# Patient Record
Sex: Female | Born: 1960
Health system: Southern US, Community
[De-identification: ages and names within clinical notes are randomized; demographics above are authoritative.]

## PROBLEM LIST (undated history)

## (undated) DIAGNOSIS — E785 Hyperlipidemia, unspecified: Secondary | ICD-10-CM

## (undated) DIAGNOSIS — R0602 Shortness of breath: Secondary | ICD-10-CM

## (undated) DIAGNOSIS — J439 Emphysema, unspecified: Secondary | ICD-10-CM

## (undated) DIAGNOSIS — R0600 Dyspnea, unspecified: Secondary | ICD-10-CM

## (undated) DIAGNOSIS — Z8052 Family history of malignant neoplasm of bladder: Secondary | ICD-10-CM

## (undated) DIAGNOSIS — Z808 Family history of malignant neoplasm of other organs or systems: Secondary | ICD-10-CM

## (undated) DIAGNOSIS — M2550 Pain in unspecified joint: Secondary | ICD-10-CM

## (undated) DIAGNOSIS — M069 Rheumatoid arthritis, unspecified: Secondary | ICD-10-CM

## (undated) DIAGNOSIS — N189 Chronic kidney disease, unspecified: Secondary | ICD-10-CM

## (undated) DIAGNOSIS — G4730 Sleep apnea, unspecified: Secondary | ICD-10-CM

## (undated) DIAGNOSIS — I2510 Atherosclerotic heart disease of native coronary artery without angina pectoris: Secondary | ICD-10-CM

## (undated) DIAGNOSIS — G629 Polyneuropathy, unspecified: Secondary | ICD-10-CM

## (undated) DIAGNOSIS — J449 Chronic obstructive pulmonary disease, unspecified: Secondary | ICD-10-CM

## (undated) DIAGNOSIS — C649 Malignant neoplasm of unspecified kidney, except renal pelvis: Secondary | ICD-10-CM

## (undated) DIAGNOSIS — K829 Disease of gallbladder, unspecified: Secondary | ICD-10-CM

## (undated) DIAGNOSIS — D333 Benign neoplasm of cranial nerves: Secondary | ICD-10-CM

## (undated) DIAGNOSIS — I10 Essential (primary) hypertension: Secondary | ICD-10-CM

## (undated) DIAGNOSIS — Z803 Family history of malignant neoplasm of breast: Secondary | ICD-10-CM

## (undated) DIAGNOSIS — M549 Dorsalgia, unspecified: Secondary | ICD-10-CM

## (undated) DIAGNOSIS — R7303 Prediabetes: Secondary | ICD-10-CM

## (undated) DIAGNOSIS — H469 Unspecified optic neuritis: Secondary | ICD-10-CM

## (undated) HISTORY — DX: Family history of malignant neoplasm of other organs or systems: Z80.8

## (undated) HISTORY — PX: HYSTERECTOMY ABDOMINAL WITH SALPINGECTOMY: SHX6725

## (undated) HISTORY — DX: Family history of malignant neoplasm of bladder: Z80.52

## (undated) HISTORY — DX: Shortness of breath: R06.02

## (undated) HISTORY — PX: ABDOMINAL HYSTERECTOMY: SHX81

## (undated) HISTORY — DX: Pain in unspecified joint: M25.50

## (undated) HISTORY — DX: Hyperlipidemia, unspecified: E78.5

## (undated) HISTORY — DX: Disease of gallbladder, unspecified: K82.9

## (undated) HISTORY — DX: Atherosclerotic heart disease of native coronary artery without angina pectoris: I25.10

## (undated) HISTORY — PX: MENISCUS REPAIR: SHX5179

## (undated) HISTORY — PX: CHOLECYSTECTOMY: SHX55

## (undated) HISTORY — DX: Polyneuropathy, unspecified: G62.9

## (undated) HISTORY — DX: Unspecified optic neuritis: H46.9

## (undated) HISTORY — DX: Malignant neoplasm of unspecified kidney, except renal pelvis: C64.9

## (undated) HISTORY — DX: Prediabetes: R73.03

## (undated) HISTORY — DX: Chronic obstructive pulmonary disease, unspecified: J44.9

## (undated) HISTORY — DX: Family history of malignant neoplasm of breast: Z80.3

## (undated) HISTORY — DX: Dorsalgia, unspecified: M54.9

## (undated) HISTORY — DX: Rheumatoid arthritis, unspecified: M06.9

## (undated) HISTORY — DX: Emphysema, unspecified: J43.9

---

## 1993-04-18 HISTORY — PX: OTHER SURGICAL HISTORY: SHX169

## 1998-04-18 HISTORY — PX: BREAST EXCISIONAL BIOPSY: SUR124

## 2009-04-18 HISTORY — PX: CRANIOTOMY: SHX93

## 2009-04-18 HISTORY — PX: ACOUSTIC NEUROMA RESECTION: SHX5713

## 2009-04-18 HISTORY — PX: OTHER SURGICAL HISTORY: SHX169

## 2012-02-14 ENCOUNTER — Other Ambulatory Visit: Payer: Self-pay | Admitting: Family Medicine

## 2012-02-14 DIAGNOSIS — D333 Benign neoplasm of cranial nerves: Secondary | ICD-10-CM

## 2012-02-22 ENCOUNTER — Ambulatory Visit
Admission: RE | Admit: 2012-02-22 | Discharge: 2012-02-22 | Disposition: A | Discharge status: Home / Self Care | Payer: Medicare Other | Source: Ambulatory Visit | Attending: Family Medicine | Admitting: Family Medicine

## 2012-02-22 DIAGNOSIS — D333 Benign neoplasm of cranial nerves: Secondary | ICD-10-CM

## 2012-02-22 MED ORDER — GADOBENATE DIMEGLUMINE 529 MG/ML IV SOLN
20.0000 mL | Freq: Once | INTRAVENOUS | Status: AC | PRN
  Administered 2012-02-22: 20 mL via INTRAVENOUS

## 2013-12-13 ENCOUNTER — Other Ambulatory Visit: Payer: Self-pay

## 2013-12-13 DIAGNOSIS — Z1231 Encounter for screening mammogram for malignant neoplasm of breast: Secondary | ICD-10-CM

## 2013-12-20 ENCOUNTER — Ambulatory Visit
Admission: RE | Admit: 2013-12-20 | Discharge: 2013-12-20 | Disposition: A | Discharge status: Home / Self Care | Payer: Medicare Other | Source: Ambulatory Visit

## 2013-12-20 DIAGNOSIS — Z1231 Encounter for screening mammogram for malignant neoplasm of breast: Secondary | ICD-10-CM

## 2013-12-26 ENCOUNTER — Other Ambulatory Visit: Payer: Self-pay | Admitting: Family Medicine

## 2013-12-26 DIAGNOSIS — R928 Other abnormal and inconclusive findings on diagnostic imaging of breast: Secondary | ICD-10-CM

## 2014-01-02 ENCOUNTER — Ambulatory Visit
Admission: RE | Admit: 2014-01-02 | Discharge: 2014-01-02 | Disposition: A | Discharge status: Home / Self Care | Payer: Medicare Other | Source: Ambulatory Visit | Attending: Family Medicine | Admitting: Family Medicine

## 2014-01-02 DIAGNOSIS — R928 Other abnormal and inconclusive findings on diagnostic imaging of breast: Secondary | ICD-10-CM

## 2014-12-15 DIAGNOSIS — R5383 Other fatigue: Secondary | ICD-10-CM | POA: Diagnosis not present

## 2014-12-15 DIAGNOSIS — I10 Essential (primary) hypertension: Secondary | ICD-10-CM | POA: Diagnosis not present

## 2014-12-15 DIAGNOSIS — F17210 Nicotine dependence, cigarettes, uncomplicated: Secondary | ICD-10-CM | POA: Diagnosis not present

## 2014-12-15 DIAGNOSIS — R2689 Other abnormalities of gait and mobility: Secondary | ICD-10-CM | POA: Diagnosis not present

## 2014-12-15 DIAGNOSIS — E6609 Other obesity due to excess calories: Secondary | ICD-10-CM | POA: Diagnosis not present

## 2014-12-15 DIAGNOSIS — Z0000 Encounter for general adult medical examination without abnormal findings: Secondary | ICD-10-CM | POA: Diagnosis not present

## 2014-12-15 DIAGNOSIS — G4730 Sleep apnea, unspecified: Secondary | ICD-10-CM | POA: Diagnosis not present

## 2014-12-26 DIAGNOSIS — H524 Presbyopia: Secondary | ICD-10-CM | POA: Diagnosis not present

## 2014-12-26 DIAGNOSIS — H5210 Myopia, unspecified eye: Secondary | ICD-10-CM | POA: Diagnosis not present

## 2015-02-03 DIAGNOSIS — G4733 Obstructive sleep apnea (adult) (pediatric): Secondary | ICD-10-CM | POA: Diagnosis not present

## 2015-02-03 DIAGNOSIS — G4700 Insomnia, unspecified: Secondary | ICD-10-CM | POA: Diagnosis not present

## 2015-02-03 DIAGNOSIS — Z720 Tobacco use: Secondary | ICD-10-CM | POA: Diagnosis not present

## 2015-02-18 DIAGNOSIS — G4733 Obstructive sleep apnea (adult) (pediatric): Secondary | ICD-10-CM | POA: Diagnosis not present

## 2015-03-10 DIAGNOSIS — G4700 Insomnia, unspecified: Secondary | ICD-10-CM | POA: Diagnosis not present

## 2015-05-18 DIAGNOSIS — G4700 Insomnia, unspecified: Secondary | ICD-10-CM | POA: Diagnosis not present

## 2015-05-30 DIAGNOSIS — G4733 Obstructive sleep apnea (adult) (pediatric): Secondary | ICD-10-CM | POA: Diagnosis not present

## 2015-07-16 DIAGNOSIS — R229 Localized swelling, mass and lump, unspecified: Secondary | ICD-10-CM | POA: Diagnosis not present

## 2015-08-13 DIAGNOSIS — M722 Plantar fascial fibromatosis: Secondary | ICD-10-CM | POA: Diagnosis not present

## 2015-08-13 DIAGNOSIS — Z86018 Personal history of other benign neoplasm: Secondary | ICD-10-CM | POA: Diagnosis not present

## 2015-08-13 DIAGNOSIS — B070 Plantar wart: Secondary | ICD-10-CM | POA: Diagnosis not present

## 2015-08-13 DIAGNOSIS — T148 Other injury of unspecified body region: Secondary | ICD-10-CM | POA: Diagnosis not present

## 2015-08-18 DIAGNOSIS — M25571 Pain in right ankle and joints of right foot: Secondary | ICD-10-CM | POA: Diagnosis not present

## 2015-08-18 DIAGNOSIS — M722 Plantar fascial fibromatosis: Secondary | ICD-10-CM | POA: Diagnosis not present

## 2015-08-18 DIAGNOSIS — D2371 Other benign neoplasm of skin of right lower limb, including hip: Secondary | ICD-10-CM | POA: Diagnosis not present

## 2015-08-18 DIAGNOSIS — M10079 Idiopathic gout, unspecified ankle and foot: Secondary | ICD-10-CM | POA: Diagnosis not present

## 2015-08-18 DIAGNOSIS — M79671 Pain in right foot: Secondary | ICD-10-CM | POA: Diagnosis not present

## 2015-08-18 DIAGNOSIS — Z79899 Other long term (current) drug therapy: Secondary | ICD-10-CM | POA: Diagnosis not present

## 2015-08-18 DIAGNOSIS — M79672 Pain in left foot: Secondary | ICD-10-CM | POA: Diagnosis not present

## 2015-08-25 DIAGNOSIS — M25571 Pain in right ankle and joints of right foot: Secondary | ICD-10-CM | POA: Diagnosis not present

## 2015-08-25 DIAGNOSIS — G5762 Lesion of plantar nerve, left lower limb: Secondary | ICD-10-CM | POA: Diagnosis not present

## 2015-08-25 DIAGNOSIS — M65872 Other synovitis and tenosynovitis, left ankle and foot: Secondary | ICD-10-CM | POA: Diagnosis not present

## 2015-08-25 DIAGNOSIS — G5761 Lesion of plantar nerve, right lower limb: Secondary | ICD-10-CM | POA: Diagnosis not present

## 2015-08-25 DIAGNOSIS — M65871 Other synovitis and tenosynovitis, right ankle and foot: Secondary | ICD-10-CM | POA: Diagnosis not present

## 2015-08-25 DIAGNOSIS — M792 Neuralgia and neuritis, unspecified: Secondary | ICD-10-CM | POA: Diagnosis not present

## 2015-09-01 DIAGNOSIS — J343 Hypertrophy of nasal turbinates: Secondary | ICD-10-CM | POA: Diagnosis not present

## 2015-09-01 DIAGNOSIS — G4733 Obstructive sleep apnea (adult) (pediatric): Secondary | ICD-10-CM | POA: Diagnosis not present

## 2015-09-01 DIAGNOSIS — H9042 Sensorineural hearing loss, unilateral, left ear, with unrestricted hearing on the contralateral side: Secondary | ICD-10-CM | POA: Diagnosis not present

## 2015-09-01 DIAGNOSIS — H9312 Tinnitus, left ear: Secondary | ICD-10-CM | POA: Diagnosis not present

## 2015-09-01 DIAGNOSIS — H9202 Otalgia, left ear: Secondary | ICD-10-CM | POA: Diagnosis not present

## 2015-09-01 DIAGNOSIS — H6122 Impacted cerumen, left ear: Secondary | ICD-10-CM | POA: Diagnosis not present

## 2015-09-01 DIAGNOSIS — Z86018 Personal history of other benign neoplasm: Secondary | ICD-10-CM | POA: Diagnosis not present

## 2015-09-08 DIAGNOSIS — G5761 Lesion of plantar nerve, right lower limb: Secondary | ICD-10-CM | POA: Diagnosis not present

## 2015-09-08 DIAGNOSIS — M792 Neuralgia and neuritis, unspecified: Secondary | ICD-10-CM | POA: Diagnosis not present

## 2015-09-08 DIAGNOSIS — G5762 Lesion of plantar nerve, left lower limb: Secondary | ICD-10-CM | POA: Diagnosis not present

## 2015-09-24 DIAGNOSIS — H9042 Sensorineural hearing loss, unilateral, left ear, with unrestricted hearing on the contralateral side: Secondary | ICD-10-CM | POA: Diagnosis not present

## 2015-10-11 ENCOUNTER — Encounter (HOSPITAL_COMMUNITY): Payer: Self-pay

## 2015-10-11 ENCOUNTER — Emergency Department (HOSPITAL_COMMUNITY)
Admission: EM | Admit: 2015-10-11 | Discharge: 2015-10-11 | Disposition: A | Discharge status: Home / Self Care | Payer: Commercial Managed Care - HMO | Attending: Emergency Medicine | Admitting: Emergency Medicine

## 2015-10-11 DIAGNOSIS — Z79899 Other long term (current) drug therapy: Secondary | ICD-10-CM | POA: Diagnosis not present

## 2015-10-11 DIAGNOSIS — M545 Low back pain, unspecified: Secondary | ICD-10-CM

## 2015-10-11 DIAGNOSIS — Z791 Long term (current) use of non-steroidal anti-inflammatories (NSAID): Secondary | ICD-10-CM | POA: Insufficient documentation

## 2015-10-11 DIAGNOSIS — I10 Essential (primary) hypertension: Secondary | ICD-10-CM | POA: Insufficient documentation

## 2015-10-11 DIAGNOSIS — M549 Dorsalgia, unspecified: Secondary | ICD-10-CM | POA: Diagnosis present

## 2015-10-11 DIAGNOSIS — R109 Unspecified abdominal pain: Secondary | ICD-10-CM | POA: Insufficient documentation

## 2015-10-11 HISTORY — DX: Essential (primary) hypertension: I10

## 2015-10-11 LAB — I-STAT CHEM 8, ED
BUN: 10 mg/dL (ref 6–20)
Calcium, Ion: 1.18 mmol/L (ref 1.12–1.23)
Chloride: 104 mmol/L (ref 101–111)
Creatinine, Ser: 0.7 mg/dL (ref 0.44–1.00)
Glucose, Bld: 83 mg/dL (ref 65–99)
HCT: 44 % (ref 36.0–46.0)
Hemoglobin: 15 g/dL (ref 12.0–15.0)
Potassium: 3.9 mmol/L (ref 3.5–5.1)
Sodium: 143 mmol/L (ref 135–145)
TCO2: 25 mmol/L (ref 0–100)

## 2015-10-11 LAB — URINALYSIS, ROUTINE W REFLEX MICROSCOPIC
Bilirubin Urine: NEGATIVE
Glucose, UA: NEGATIVE mg/dL
Hgb urine dipstick: NEGATIVE
Ketones, ur: NEGATIVE mg/dL
Leukocytes, UA: NEGATIVE
Nitrite: NEGATIVE
Protein, ur: NEGATIVE mg/dL
Specific Gravity, Urine: 1.018 (ref 1.005–1.030)
pH: 5.5 (ref 5.0–8.0)

## 2015-10-11 MED ORDER — NAPROXEN 375 MG PO TABS: 375.0000 mg | ORAL_TABLET | Freq: Two times a day (BID) | ORAL | Status: DC

## 2015-10-11 MED ORDER — NAPROXEN 250 MG PO TABS
375.0000 mg | ORAL_TABLET | Freq: Once | ORAL | Status: AC
  Administered 2015-10-11: 375 mg via ORAL
  Filled 2015-10-11: qty 2

## 2015-10-11 MED ORDER — PREDNISONE 20 MG PO TABS: 20.0000 mg | ORAL_TABLET | Freq: Every day | ORAL | Status: DC

## 2015-10-11 MED ORDER — ACETAMINOPHEN 500 MG PO TABS
500.0000 mg | ORAL_TABLET | Freq: Four times a day (QID) | ORAL | Status: AC
Start: 2015-10-11 — End: 2015-10-17

## 2015-10-11 MED ORDER — CYCLOBENZAPRINE HCL 10 MG PO TABS: 10.0000 mg | ORAL_TABLET | Freq: Every day | ORAL | Status: DC

## 2015-10-11 MED ORDER — ACETAMINOPHEN 500 MG PO TABS
1000.0000 mg | ORAL_TABLET | Freq: Once | ORAL | Status: AC
  Administered 2015-10-11: 1000 mg via ORAL
  Filled 2015-10-11: qty 2

## 2015-10-11 NOTE — ED Provider Notes (Signed)
55 year old female, she has had some left-sided flank and lower back pain with some radiation to the side but no abdominal discomfort. On exam the patient does every principal tenderness with both movement and palpation, there is no midline tenderness, she has normal neurovascular exam of the lower extremities including pulses strength and sensation. Labs reviewed including urinalysis and chemistry, unremarkable, the patient is not having urinary symptoms, she appears well for discharge, she was encouraged to use anti-inflammatories and a muscle relaxant and follow-up in 48 hours with her family doctor and was in full agreement. I doubt intra-abdominal pathology including aneurysm, kidney stone or  pyelonephritis given exam and results. The patient was informed of the indications for return and she is in agreement.  I saw and evaluated the patient, reviewed the resident's note and I agree with the findings and plan.    Final diagnoses:  Left-sided low back pain without sciatica      Noemi Chapel, MD 10/13/15 1044

## 2015-10-11 NOTE — ED Provider Notes (Signed)
CSN: [id number]     Arrival date & time 10/11/15  1354 History   First MD Initiated Contact with Patient 10/11/15 1558     Chief Complaint  Patient presents with  . Flank Pain   HPI Patient denies of left flank pain for 2 days, worse with movement and change of position. She states she woke up with the pain like this 2-3 days ago. Took Aleve 2 days ago but none since. This did not appear to help. Any movement of her back seems to make it worse. No chest pain, no shortness of breath, no history of MI. No abdominal pain. No vomiting, no anorexia. No leg weakness, no incontinence. No numbness. No pain in her lower extremities. She is able to ambulate but the pain keeps her from wanting to so she has been laying in bed. She has had back strains in the past but never this bad. She is disabled secondary to painful conditions but is not taking narcotics long-term. Patient states that she attempted to go to her primary care provider yesterday but she would've had to wait 2 hours so she left. Since she was not feeling better, she presented today.  Past Medical History  Diagnosis Date  . Hypertension    History reviewed. No pertinent past surgical history. No family history on file. Social History  Substance Use Topics  . Smoking status: Never Smoker   . Smokeless tobacco: None  . Alcohol Use: None   OB History    No data available     Review of Systems  Constitutional: Negative for fever.  Allergic/Immunologic: Negative for immunocompromised state.  All other systems reviewed and are negative.     Allergies  Review of patient's allergies indicates no known allergies.  Home Medications   Prior to Admission medications   Medication Sig Start Date End Date Taking? Authorizing Provider  acetaminophen (TYLENOL) 500 MG tablet Take 1 tablet (500 mg total) by mouth every 6 (six) hours. 10/11/15 10/17/15  Karma Greaser, MD  ALPRAZolam Duanne Moron) 0.25 MG tablet Take 0.25 mg by mouth daily as  needed for anxiety or sleep.  08/12/15  Yes Historical Provider, MD  clotrimazole-betamethasone (LOTRISONE) cream Apply 1 application topically daily as needed. 07/21/15  Yes Historical Provider, MD  cyclobenzaprine (FLEXERIL) 10 MG tablet Take 1 tablet (10 mg total) by mouth at bedtime. 10/11/15   Karma Greaser, MD  gabapentin (NEURONTIN) 300 MG capsule Take 300 mg by mouth at bedtime. 09/08/15  Yes Historical Provider, MD  ibuprofen (ADVIL,MOTRIN) 200 MG tablet Take 400 mg by mouth every 6 (six) hours as needed for moderate pain.   Yes Historical Provider, MD  lisinopril (PRINIVIL,ZESTRIL) 10 MG tablet Take 10 mg by mouth daily. 09/11/15  Yes Historical Provider, MD  naproxen (NAPROSYN) 375 MG tablet Take 1 tablet (375 mg total) by mouth 2 (two) times daily. 10/11/15   Karma Greaser, MD  naproxen sodium (ANAPROX) 220 MG tablet Take 220 mg by mouth 2 (two) times daily as needed (for pai n).   Yes Historical Provider, MD  predniSONE (DELTASONE) 20 MG tablet Take 1 tablet (20 mg total) by mouth daily. 10/13/15   Karma Greaser, MD   BP 141/90 mmHg  Pulse 75  Temp(Src) 98.6 F (37 C) (Oral)  Resp 18  SpO2 96% Physical Exam  Constitutional: She is oriented to person, place, and time. She appears well-developed and well-nourished. No distress.  HENT:  Head: Normocephalic and atraumatic.  Eyes: Conjunctivae are  normal. Right eye exhibits no discharge. Left eye exhibits no discharge.  Neck: Normal range of motion. Neck supple.  Cardiovascular: Normal rate and regular rhythm.   Pulmonary/Chest: Effort normal and breath sounds normal. No respiratory distress. She has no wheezes. She has no rales. She exhibits no tenderness.  Abdominal: Soft. Bowel sounds are normal. She exhibits no distension and no mass. There is no tenderness. There is no rebound and no guarding.  Neg cvat Neg murphys sign  Musculoskeletal: She exhibits no edema.  5/5 strength in lower extremities, normal saddle  sensation, normal sensation  Patient sits up and it makes her left lower back cramp  No C-spine, T-spine, L-spine tenderness or step-off  Neurological: She is alert and oriented to person, place, and time.  Skin: Skin is warm. No rash noted.  Psychiatric: She has a normal mood and affect.  Nursing note and vitals reviewed.   ED Course  Procedures (including critical care time) Labs Review Labs Reviewed  URINALYSIS, ROUTINE W REFLEX MICROSCOPIC (NOT AT [facility])  I-STAT CHEM 8, ED    Imaging Review No results found. I have personally reviewed and evaluated these images and lab results as part of my medical decision-making.   EKG Interpretation None      EMERGENCY DEPARTMENT [place] ABD/AORTA EXAM Study: Limited Ultrasound of the Abdominal Aorta.  INDICATIONS:Back pain Indication: Multiple views of the abdominal aorta are obtained from the diaphragmatic hiatus to the aortic bifurcation in transverse and sagittal planes with a multi- Frequency probe.  PERFORMED BY: Myself  IMAGES ARCHIVED?: Yes  FINDINGS: Maximum aortic dimensions are 2.46  LIMITATIONS:  Body habitus  INTERPRETATION:  No abdominal aortic aneurysm  COMMENT:  none  MDM   Final diagnoses:  Left-sided low back pain without sciatica    Patient has back pain on the left flank and left lower lumbar back. No evidence of saddle anesthesia. Patient able to ambulate and no weakness or numbness. No blood in her urine and no evidence of infection, doubt stone or pyelonephritis. Patient has no abdominal pain and abdomen is benign, doubt acute abdomen. Ultrasound of aorta without aneurysm. NSAIDs and Tylenol, back exercises provided. Patient also given Flexeril as needed. Prednisone pack in 2 days if symptoms continue despite this. Follow up with primary care provider strict return precautions. Patient verbalized understanding and agreement with plan.  Karma Greaser, MD 10/11/15 1721  Noemi Chapel, MD 10/13/15  1045

## 2015-10-11 NOTE — Discharge Instructions (Signed)
Perform your back exercises at least 3 times a day for a minimum of 10 minutes. Ambulate and take short walks for a minimum of 10 minutes 3 times a day. Take naproxen twice a day for 10 days. Take Tylenol every 6 hours for the next 3 days and then as needed. Take Flexeril at night for muscle spasms if needed. Do not operate heavy machinery if you feel drowsy in the morning from muscle relaxer. Take prednisone only if needed for a full 5 day course in two days if symptoms continue without improvement. Follow-up with your primary care doctor.  Back Exercises The following exercises strengthen the muscles that help to support the back. They also help to keep the lower back flexible. Doing these exercises can help to prevent back pain or lessen existing pain. If you have back pain or discomfort, try doing these exercises 2-3 times each day or as told by your health care provider. When the pain goes away, do them once each day, but increase the number of times that you repeat the steps for each exercise (do more repetitions). If you do not have back pain or discomfort, do these exercises once each day or as told by your health care provider. EXERCISES Single Knee to Chest Repeat these steps 3-5 times for each leg: 1. Lie on your back on a firm bed or the floor with your legs extended. 2. Bring one knee to your chest. Your other leg should stay extended and in contact with the floor. 3. Hold your knee in place by grabbing your knee or thigh. 4. Pull on your knee until you feel a gentle stretch in your lower back. 5. Hold the stretch for 10-30 seconds. 6. Slowly release and straighten your leg. Pelvic Tilt Repeat these steps 5-10 times: 1. Lie on your back on a firm bed or the floor with your legs extended. 2. Bend your knees so they are pointing toward the ceiling and your feet are flat on the floor. 3. Tighten your lower abdominal muscles to press your lower back against the floor. This motion will tilt  your pelvis so your tailbone points up toward the ceiling instead of pointing to your feet or the floor. 4. With gentle tension and even breathing, hold this position for 5-10 seconds. Cat-Cow Repeat these steps until your lower back becomes more flexible: 1. Get into a hands-and-knees position on a firm surface. Keep your hands under your shoulders, and keep your knees under your hips. You may place padding under your knees for comfort. 2. Let your head hang down, and point your tailbone toward the floor so your lower back becomes rounded like the back of a cat. 3. Hold this position for 5 seconds. 4. Slowly lift your head and point your tailbone up toward the ceiling so your back forms a sagging arch like the back of a cow. 5. Hold this position for 5 seconds. Press-Ups Repeat these steps 5-10 times: 1. Lie on your abdomen (face-down) on the floor. 2. Place your palms near your head, about shoulder-width apart. 3. While you keep your back as relaxed as possible and keep your hips on the floor, slowly straighten your arms to raise the top half of your body and lift your shoulders. Do not use your back muscles to raise your upper torso. You may adjust the placement of your hands to make yourself more comfortable. 4. Hold this position for 5 seconds while you keep your back relaxed. 5. Slowly return  to lying flat on the floor. Bridges Repeat these steps 10 times: 1. Lie on your back on a firm surface. 2. Bend your knees so they are pointing toward the ceiling and your feet are flat on the floor. 3. Tighten your buttocks muscles and lift your buttocks off of the floor until your waist is at almost the same height as your knees. You should feel the muscles working in your buttocks and the back of your thighs. If you do not feel these muscles, slide your feet 1-2 inches farther away from your buttocks. 4. Hold this position for 3-5 seconds. 5. Slowly lower your hips to the starting position, and  allow your buttocks muscles to relax completely. If this exercise is too easy, try doing it with your arms crossed over your chest. Abdominal Crunches Repeat these steps 5-10 times: 1. Lie on your back on a firm bed or the floor with your legs extended. 2. Bend your knees so they are pointing toward the ceiling and your feet are flat on the floor. 3. Cross your arms over your chest. 4. Tip your chin slightly toward your chest without bending your neck. 5. Tighten your abdominal muscles and slowly raise your trunk (torso) high enough to lift your shoulder blades a tiny bit off of the floor. Avoid raising your torso higher than that, because it can put too much stress on your low back and it does not help to strengthen your abdominal muscles. 6. Slowly return to your starting position. Back Lifts Repeat these steps 5-10 times: 1. Lie on your abdomen (face-down) with your arms at your sides, and rest your forehead on the floor. 2. Tighten the muscles in your legs and your buttocks. 3. Slowly lift your chest off of the floor while you keep your hips pressed to the floor. Keep the back of your head in line with the curve in your back. Your eyes should be looking at the floor. 4. Hold this position for 3-5 seconds. 5. Slowly return to your starting position. SEEK MEDICAL CARE IF:  Your back pain or discomfort gets much worse when you do an exercise.  Your back pain or discomfort does not lessen within 2 hours after you exercise. If you have any of these problems, stop doing these exercises right away. Do not do them again unless your health care provider says that you can. SEEK IMMEDIATE MEDICAL CARE IF:  You develop sudden, severe back pain. If this happens, stop doing the exercises right away. Do not do them again unless your health care provider says that you can.   This information is not intended to replace advice given to you by your health care provider. Make sure you discuss any  questions you have with your health care provider.   Document Released: 05/12/2004 Document Revised: 12/24/2014 Document Reviewed: 05/29/2014 Elsevier Interactive Patient Education [employer].

## 2015-10-11 NOTE — ED Notes (Signed)
Patient complains of left flank pain x 2 days. Pain worse with movement and change in position

## 2015-10-11 NOTE — ED Notes (Signed)
Pt. Having lt. Flank pain. The pain began 3-4 days ago. The pain increases with movement .  Pt. Denies any injuries.

## 2015-11-16 DIAGNOSIS — M542 Cervicalgia: Secondary | ICD-10-CM | POA: Diagnosis not present

## 2015-11-23 ENCOUNTER — Ambulatory Visit
Admission: RE | Admit: 2015-11-23 | Discharge: 2015-11-23 | Disposition: A | Discharge status: Home / Self Care | Payer: Commercial Managed Care - HMO | Source: Ambulatory Visit | Attending: Family Medicine | Admitting: Family Medicine

## 2015-11-23 ENCOUNTER — Other Ambulatory Visit: Payer: Self-pay | Admitting: Family Medicine

## 2015-11-23 DIAGNOSIS — M542 Cervicalgia: Secondary | ICD-10-CM

## 2015-11-23 DIAGNOSIS — M47812 Spondylosis without myelopathy or radiculopathy, cervical region: Secondary | ICD-10-CM | POA: Diagnosis not present

## 2015-12-01 DIAGNOSIS — H9072 Mixed conductive and sensorineural hearing loss, unilateral, left ear, with unrestricted hearing on the contralateral side: Secondary | ICD-10-CM | POA: Diagnosis not present

## 2015-12-01 DIAGNOSIS — H6123 Impacted cerumen, bilateral: Secondary | ICD-10-CM | POA: Diagnosis not present

## 2015-12-14 DIAGNOSIS — M542 Cervicalgia: Secondary | ICD-10-CM | POA: Diagnosis not present

## 2015-12-14 DIAGNOSIS — M50322 Other cervical disc degeneration at C5-C6 level: Secondary | ICD-10-CM | POA: Diagnosis not present

## 2015-12-14 DIAGNOSIS — Z981 Arthrodesis status: Secondary | ICD-10-CM | POA: Diagnosis not present

## 2015-12-14 DIAGNOSIS — M5031 Other cervical disc degeneration,  high cervical region: Secondary | ICD-10-CM | POA: Diagnosis not present

## 2015-12-16 DIAGNOSIS — E785 Hyperlipidemia, unspecified: Secondary | ICD-10-CM | POA: Diagnosis not present

## 2015-12-16 DIAGNOSIS — I10 Essential (primary) hypertension: Secondary | ICD-10-CM | POA: Diagnosis not present

## 2015-12-16 DIAGNOSIS — Z0000 Encounter for general adult medical examination without abnormal findings: Secondary | ICD-10-CM | POA: Diagnosis not present

## 2015-12-16 DIAGNOSIS — F17210 Nicotine dependence, cigarettes, uncomplicated: Secondary | ICD-10-CM | POA: Diagnosis not present

## 2015-12-16 DIAGNOSIS — M5030 Other cervical disc degeneration, unspecified cervical region: Secondary | ICD-10-CM | POA: Diagnosis not present

## 2015-12-16 DIAGNOSIS — R2689 Other abnormalities of gait and mobility: Secondary | ICD-10-CM | POA: Diagnosis not present

## 2015-12-16 DIAGNOSIS — R7303 Prediabetes: Secondary | ICD-10-CM | POA: Diagnosis not present

## 2015-12-16 DIAGNOSIS — G4730 Sleep apnea, unspecified: Secondary | ICD-10-CM | POA: Diagnosis not present

## 2015-12-16 DIAGNOSIS — Z1159 Encounter for screening for other viral diseases: Secondary | ICD-10-CM | POA: Diagnosis not present

## 2015-12-17 ENCOUNTER — Other Ambulatory Visit: Payer: Self-pay | Admitting: Orthopedic Surgery

## 2015-12-17 DIAGNOSIS — M542 Cervicalgia: Secondary | ICD-10-CM

## 2015-12-30 ENCOUNTER — Ambulatory Visit
Admission: RE | Admit: 2015-12-30 | Discharge: 2015-12-30 | Disposition: A | Discharge status: Home / Self Care | Payer: Commercial Managed Care - HMO | Source: Ambulatory Visit | Attending: Orthopedic Surgery | Admitting: Orthopedic Surgery

## 2015-12-30 DIAGNOSIS — M542 Cervicalgia: Secondary | ICD-10-CM

## 2015-12-30 DIAGNOSIS — M4802 Spinal stenosis, cervical region: Secondary | ICD-10-CM | POA: Diagnosis not present

## 2015-12-30 MED ORDER — IBUPROFEN 400 MG PO TABS
400.0000 mg | ORAL_TABLET | Freq: Once | ORAL | Status: AC
  Administered 2015-12-30: 400 mg via ORAL

## 2015-12-30 MED ORDER — IOPAMIDOL (ISOVUE-M 300) INJECTION 61%
10.0000 mL | Freq: Once | INTRAMUSCULAR | Status: AC | PRN
  Administered 2015-12-30: 10 mL via INTRATHECAL

## 2015-12-30 MED ORDER — DIAZEPAM 5 MG PO TABS
10.0000 mg | ORAL_TABLET | Freq: Once | ORAL | Status: AC
  Administered 2015-12-30: 10 mg via ORAL

## 2015-12-30 NOTE — Discharge Instructions (Signed)

## 2016-01-01 ENCOUNTER — Telehealth: Payer: Self-pay

## 2016-01-01 NOTE — Telephone Encounter (Signed)
LMOVM of patient's cell phone to see how she is doing after her myelogram here 12/30/15.  [name]

## 2016-01-06 DIAGNOSIS — Q7649 Other congenital malformations of spine, not associated with scoliosis: Secondary | ICD-10-CM | POA: Diagnosis not present

## 2016-01-06 DIAGNOSIS — Z981 Arthrodesis status: Secondary | ICD-10-CM | POA: Diagnosis not present

## 2016-01-25 DIAGNOSIS — M542 Cervicalgia: Secondary | ICD-10-CM | POA: Diagnosis not present

## 2016-01-25 DIAGNOSIS — M79642 Pain in left hand: Secondary | ICD-10-CM | POA: Diagnosis not present

## 2016-01-25 DIAGNOSIS — F17210 Nicotine dependence, cigarettes, uncomplicated: Secondary | ICD-10-CM | POA: Diagnosis not present

## 2016-01-25 DIAGNOSIS — Z716 Tobacco abuse counseling: Secondary | ICD-10-CM | POA: Diagnosis not present

## 2016-02-11 DIAGNOSIS — Z6841 Body Mass Index (BMI) 40.0 and over, adult: Secondary | ICD-10-CM | POA: Diagnosis not present

## 2016-02-11 DIAGNOSIS — M5030 Other cervical disc degeneration, unspecified cervical region: Secondary | ICD-10-CM | POA: Diagnosis not present

## 2016-02-11 DIAGNOSIS — F17210 Nicotine dependence, cigarettes, uncomplicated: Secondary | ICD-10-CM | POA: Diagnosis not present

## 2016-02-11 DIAGNOSIS — E6609 Other obesity due to excess calories: Secondary | ICD-10-CM | POA: Diagnosis not present

## 2016-03-01 DIAGNOSIS — Z87891 Personal history of nicotine dependence: Secondary | ICD-10-CM | POA: Diagnosis not present

## 2016-03-01 DIAGNOSIS — M542 Cervicalgia: Secondary | ICD-10-CM | POA: Diagnosis not present

## 2016-03-01 DIAGNOSIS — M5412 Radiculopathy, cervical region: Secondary | ICD-10-CM | POA: Diagnosis not present

## 2016-03-01 DIAGNOSIS — Z981 Arthrodesis status: Secondary | ICD-10-CM | POA: Diagnosis not present

## 2016-05-09 ENCOUNTER — Ambulatory Visit (HOSPITAL_BASED_OUTPATIENT_CLINIC_OR_DEPARTMENT_OTHER): Payer: Medicare HMO | Admitting: Physical Medicine & Rehabilitation

## 2016-05-09 ENCOUNTER — Encounter: Payer: Self-pay | Admitting: Physical Medicine & Rehabilitation

## 2016-05-09 ENCOUNTER — Encounter: Payer: Medicare HMO | Attending: Physical Medicine & Rehabilitation

## 2016-05-09 VITALS — BP 137/84 | HR 74 | Resp 16

## 2016-05-09 DIAGNOSIS — Z981 Arthrodesis status: Secondary | ICD-10-CM | POA: Insufficient documentation

## 2016-05-09 DIAGNOSIS — R269 Unspecified abnormalities of gait and mobility: Secondary | ICD-10-CM | POA: Insufficient documentation

## 2016-05-09 DIAGNOSIS — Z79899 Other long term (current) drug therapy: Secondary | ICD-10-CM

## 2016-05-09 DIAGNOSIS — M542 Cervicalgia: Secondary | ICD-10-CM | POA: Diagnosis present

## 2016-05-09 DIAGNOSIS — M5031 Other cervical disc degeneration,  high cervical region: Secondary | ICD-10-CM | POA: Diagnosis not present

## 2016-05-09 DIAGNOSIS — I10 Essential (primary) hypertension: Secondary | ICD-10-CM | POA: Diagnosis not present

## 2016-05-09 DIAGNOSIS — M961 Postlaminectomy syndrome, not elsewhere classified: Secondary | ICD-10-CM | POA: Diagnosis not present

## 2016-05-09 DIAGNOSIS — Z5181 Encounter for therapeutic drug level monitoring: Secondary | ICD-10-CM | POA: Diagnosis not present

## 2016-05-09 DIAGNOSIS — M7918 Myalgia, other site: Secondary | ICD-10-CM | POA: Insufficient documentation

## 2016-05-09 DIAGNOSIS — M4802 Spinal stenosis, cervical region: Secondary | ICD-10-CM | POA: Insufficient documentation

## 2016-05-09 DIAGNOSIS — M791 Myalgia: Secondary | ICD-10-CM | POA: Diagnosis not present

## 2016-05-09 DIAGNOSIS — M47812 Spondylosis without myelopathy or radiculopathy, cervical region: Secondary | ICD-10-CM

## 2016-05-09 DIAGNOSIS — G8929 Other chronic pain: Secondary | ICD-10-CM | POA: Diagnosis not present

## 2016-05-09 DIAGNOSIS — M5030 Other cervical disc degeneration, unspecified cervical region: Secondary | ICD-10-CM

## 2016-05-09 MED ORDER — TRAMADOL HCL 50 MG PO TABS: 100.0000 mg | ORAL_TABLET | Freq: Three times a day (TID) | ORAL | 1 refills | Status: DC

## 2016-05-09 NOTE — Patient Instructions (Signed)
Right C3 and right C4 medial branch blocks

## 2016-05-09 NOTE — Progress Notes (Signed)
Subjective:    Patient ID: Amanda Cordova, female    DOB: April 11, 1961, 56 y.o.   MRN: [id number] I reviewed records from primary care physician, Dr. Sabra Heck, orthopedic spine surgeon, Dr. Rolena Infante, as well as interventional physical medicine rehabilitation, Dr. Mina Marble HPI 56 year old female with chief complaint of neck pain. This pain also radiates into the right shoulder and upper arm area. It has been going on since around July 17.  No bowel or bladder incontinence, no change in her walking or mobility or her strength. No new numbness or tingling.  Patient has not tried any physical therapy or injections.  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 Evaluated by Dr. Mina Marble was concerned about cervical stenosis. Pain Inventory Average Pain 8 Pain Right Now 8 My pain is sharp, burning, stabbing, tingling and aching  In the last 24 hours, has pain interfered with the following? General activity 8 Relation with others 8 Enjoyment of life 8 What TIME of day is your pain at its worst? morning evening and night Sleep (in general) Poor  Pain is worse with: walking, bending, sitting and some activites Pain improves with: rest and heat/ice Relief from Meds: not answered  Mobility use a cane how many minutes can you walk? 20 ability to climb steps?  yes do you drive?  yes  Function disabled: date disabled . I need assistance with the following:  household duties and  shopping  Neuro/Psych trouble walking  Prior Studies Any changes since last visit?  no CLINICAL DATA:  Continued neck pain after cervical fusion. No clear radicular symptoms.  FLUOROSCOPY TIME:  dictate in minutes and seconds  PROCEDURE: LUMBAR PUNCTURE FOR CERVICAL MYELOGRAM  After thorough discussion of risks and benefits of the procedure including bleeding, infection, injury to nerves, blood vessels, adjacent structures as well as headache and CSF leak, written and oral informed consent was obtained. Consent was obtained by Dr. Rolla Flatten. We discussed the high likelihood of obtaining a diagnostic study.  Patient was positioned prone on the fluoroscopy table. Local anesthesia was provided with 1% lidocaine without epinephrine after prepped and draped in the usual sterile fashion. Puncture was performed at L3-L4 using a 3 1/2 inch 22-gauge spinal needle via midline approach. Using a single pass through the dura, the needle was placed within the thecal sac, with return of clear CSF. 10 mL of Isovue-M 300 was injected into the thecal sac, with normal opacification of the nerve roots and cauda equina consistent with free flow within the subarachnoid space. The patient was then moved to the trendelenburg position and contrast flowed into the Cervical spine region.  I personally performed the lumbar puncture and administered the intrathecal contrast. I also personally supervised acquisition of the myelogram images.  TECHNIQUE: Contiguous axial images were obtained through the Cervical spine after the intrathecal infusion of infusion. Coronal and sagittal reconstructions were obtained of the axial image sets.  FINDINGS: CERVICAL MYELOGRAM FINDINGS:  Good opacification of the cervical subarachnoid space. Previous C6-7 ACDF with anterior and posterior  instrumentation is solid. Previous non-instrumented C4-C6 fusion.  Severe disc space narrowing at C2-3 and C3-4.  Severe stenosis at C3-4 related to osseous spurring and ligamentum flavum overgrowth. BILATERAL C4 nerve root impingement. Trace anterolisthesis at C2-3, no significant stenosis at this level.  No residual impingement at the surgical levels.  CT CERVICAL MYELOGRAM FINDINGS:  Alignment: Trace anterolisthesis C2-3. Trace retrolisthesis C3-4. Straightening of the normal cervical lordosis due to the C4-C7 fusion construct.  Vertebrae: Solid C4-C7 fusion. Severe degenerative change C3-4 endplates, to a lesser degree at C2-3. Osseous spurring posteriorly.  Cord: Cord compression appears fairly severe at C3-4, minimal at C2-3.  Posterior Fossa: No tonsillar herniation. 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  Vertebral Arteries: Patency not established. LEFT appears dominant.  Paraspinal tissues: Unremarkable.  Disc levels:  C2-3: Disc space narrowing. Osseous ridging extends posteriorly and to the RIGHT. RIGHT C3 nerve root impingement. Mild canal stenosis but no cord compression.  C3-4: Severe multifactorial stenosis, related to osseous spurring, annular bulging, BILATERAL uncinate spurring, ligamentum flavum hypertrophy, and facet arthropathy. Moderate cord compression. Canal diameter 6-7 mm. BILATERAL C4 nerve root impingement is evident, worse on the RIGHT.  C4-5:  Solid fusion.  No impingement.  C5-6:  Solid fusion.  No impingement.  C6-7: Solid fusion. No impingement. Anterior and posterior instrumentation without fracture or loosening. Slight extraosseous course of the BILATERAL C6 screws posteriorly. Wire cerclage posteriorly superimposed.  C7-T1: Trace anterolisthesis with mild narrowing. No protrusion. Mild facet arthropathy. No impingement.  IMPRESSION: Solid C4-C7 ACDF. Anterior and posterior instrumentation at C6-7 is unremarkable.  Adjacent segment disease at C3-4. Severe disc space narrowing, osseous ridging,  uncinate spurring, and ligamentum flavum hypertrophy results in canal stenosis, cord compression, and RIGHT greater than LEFT C4 nerve root impingement.  Similar less severe findings at C2-3. No significant cord compression, but RIGHT C3 nerve root impingement due to bony overgrowth is observed.   Electronically Signed   By: Staci Righter M.D.   On: 12/30/2015 12:23  Physicians involved in your care Any changes since last visit?  yes   No family history on file. Social History   Social History  . Marital status: Married    Spouse name: N/A  . Number of children: N/A  . Years of education: N/A   Social History Main Topics  . Smoking status: Never Smoker  . Smokeless tobacco: Never Used  . Alcohol use None  . Drug use: Unknown  . Sexual activity: Not Asked   Other Topics Concern  . None   Social History Narrative  . None   No past surgical history on file. Past Medical History:  Diagnosis Date  . Hypertension    BP 137/84   Pulse 74   Resp 16   SpO2 96%   Opioid Risk Score:   Fall Risk Score:  `1  Depression screen PHQ 2/9  Depression screen PHQ 2/9 05/09/2016  Decreased Interest 2  Down, Depressed, Hopeless 0  PHQ - 2 Score 2  Altered sleeping 3  Tired, decreased energy 3  Change in appetite 0  Feeling bad or failure about yourself  0  Trouble concentrating 1  Moving slowly or fidgety/restless 0  Suicidal thoughts 0  PHQ-9 Score 9  Difficult doing work/chores Somewhat difficult    Review of Systems  Constitutional: Negative.   HENT: Negative.   Eyes: Negative.   Respiratory: Negative.   Cardiovascular: Negative.   Gastrointestinal: Negative.   Endocrine: Negative.   Genitourinary: Negative.  Musculoskeletal: Positive for gait problem.  Skin: Negative.   Hematological: Negative.   Psychiatric/Behavioral: Negative.   All other systems reviewed and are negative.      Objective:   Physical Exam  Constitutional: She is oriented to  person, place, and time. She appears well-developed and well-nourished.  HENT:  Head: Normocephalic and atraumatic.  Eyes: Conjunctivae and EOM are normal. Pupils are equal, round, and reactive to light.  Cardiovascular: Normal rate, regular rhythm and normal heart sounds.   Pulmonary/Chest: Effort normal and breath sounds normal. No respiratory distress. She has no wheezes.  Abdominal: Soft. Bowel sounds are normal. She exhibits distension. There is no tenderness.  Neurological: She is alert and oriented to person, place, and time.  Skin: Skin is warm and dry.  Psychiatric: She has a normal mood and affect. Her behavior is normal. Judgment and thought content normal.  Nursing note and vitals reviewed.   Paresthesia with sensory testing bilateral C4 Normal sensation to pinprick bilateral C5, C6-C7, C8 Decreased sensation in the toes compared to the ankle bilaterally. Tenderness. Palpation along the upper cervical paraspinal area, also tenderness along the bilateral upper trapezius area. Shoulder range of motion is full. Motor strength is 5/5 bilateral deltoids, biceps, plus grip, hip flexion, knee extension and dorsiflexion. Ambulatory cane. No evidence to regular knee instability.       Assessment & Plan:  1. 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 Severe fusion, now developing increasing facet arthrosis as well as cervical degenerative disc disease at C3-C4. Does have spinal stenosis but no evidence of myelopathy. At this point she can be treated conservatively. Will increase tramadol to 100 mg 3 times a day Refer to physical therapy. Trigger point injections, bilateral upper trapezius pain. If not, much better. Next visit will do right C3, right C4 cervical medial branch blocks under fluoroscopic guidance Due to significant cervical stenosis would not recommend cervical epidural

## 2016-05-15 LAB — TOXASSURE SELECT,+ANTIDEPR,UR

## 2016-05-20 NOTE — Progress Notes (Signed)
Urine drug screen for this encounter is consistent for prescribed medication 

## 2016-05-31 ENCOUNTER — Telehealth: Payer: Self-pay | Admitting: Physical Medicine & Rehabilitation

## 2016-05-31 ENCOUNTER — Telehealth: Payer: Self-pay

## 2016-05-31 DIAGNOSIS — M47812 Spondylosis without myelopathy or radiculopathy, cervical region: Secondary | ICD-10-CM

## 2016-05-31 DIAGNOSIS — M961 Postlaminectomy syndrome, not elsewhere classified: Secondary | ICD-10-CM

## 2016-05-31 NOTE — Telephone Encounter (Signed)
Per Last ov note on 05/09/16 by [place] referral needed to PT

## 2016-05-31 NOTE — Telephone Encounter (Signed)
Called stating had a reaction to medication tramadol, states made her sick, sweating, N/V, with blurry vision so she stopped taking it, also states is still trying to get in contact with physical therapy, and is wanting to cancel the procedure on 06-06-16 for right C3-4 MBB.

## 2016-06-01 NOTE — Telephone Encounter (Signed)
done

## 2016-06-01 NOTE — Telephone Encounter (Signed)
Order for PT did not get placed at time of appt, so referral placed today.  Procedure will be canceled

## 2016-06-06 ENCOUNTER — Ambulatory Visit: Payer: Medicare HMO | Admitting: Physical Medicine & Rehabilitation

## 2016-06-08 ENCOUNTER — Ambulatory Visit: Payer: Medicare HMO | Attending: Physical Medicine & Rehabilitation | Admitting: Physical Therapy

## 2016-06-08 ENCOUNTER — Encounter: Payer: Self-pay | Admitting: Physical Therapy

## 2016-06-08 DIAGNOSIS — M62838 Other muscle spasm: Secondary | ICD-10-CM | POA: Diagnosis not present

## 2016-06-08 DIAGNOSIS — M542 Cervicalgia: Secondary | ICD-10-CM | POA: Diagnosis not present

## 2016-06-08 DIAGNOSIS — M6281 Muscle weakness (generalized): Secondary | ICD-10-CM

## 2016-06-09 NOTE — Therapy (Signed)
[place], [place], [address] Phone: [phone]   Fax:  [phone]  Physical Therapy Evaluation  Patient Details  Name: Amanda Cordova MRN: [id number] Date of Birth: 01/30/1961 Referring Provider: Dr Jone Baseman   Encounter Date: 06/08/2016      PT End of Session - 06/08/16 1630    Visit Number 1   Number of Visits 16   Date for PT Re-Evaluation 08/03/16   Authorization Type medicare [employer] 40.00   PT Start Time 0845   PT Stop Time 0929   PT Time Calculation (min) 44 min   Activity Tolerance Patient tolerated treatment well   Behavior During Therapy [facility] for tasks assessed/performed      Past Medical History:  Diagnosis Date  . Hypertension     Past Surgical History:  Procedure Laterality Date  . Brain Neuroma  2011  . cerical fusion  1995    There were no vitals filed for this visit.       Subjective Assessment - 06/08/16 0855    Subjective Patient reports in June or July of 2017 she began to have cervical pain. She was bitten by a tick which she belives started the stiffness in her neck. Since that pont she feels like her neck has gotten progressivly tighter.    Pertinent History cervical fusion in 1995    Limitations Lifting;House hold activities   How long can you sit comfortably? N/A   How long can you stand comfortably? N/A    How long can you walk comfortably? N/A    Diagnostic tests Severe disc space narrowing at C2-3 and C3-4. Nerve root impingement at C4   Currently in Pain? Yes   Pain Score 6   9-10/10 pain at worst   Pain Location Neck   Pain Orientation Right   Pain Descriptors / Indicators Aching   Pain Type Chronic pain   Pain Onset More than a month ago   Pain Frequency Constant   Aggravating Factors  turning the head to the left and right; rain    Pain Relieving Factors heat    Effect of Pain on Daily Activities difficulty perfroming ADL's              [facility] PT Assessment - 06/09/16 0001      Assessment   Medical Diagnosis Left sided cervical spine pain    Referring Provider Dr Jone Baseman    Onset Date/Surgical Date --  6 months prior    Hand Dominance Right   Next MD Visit None Scheduled    Prior Therapy None      Precautions   Precautions None     Balance Screen   Has the patient fallen in the past 6 months No   Has the patient had a decrease in activity level because of a fear of falling?  No   Is the patient reluctant to leave their home because of a fear of falling?  No     Home Environment   Additional Comments Nothing signficant      Prior Function   Level of Independence Independent   Vocation On disability   Leisure Nothing      Cognition   Overall Cognitive Status Within Functional Limits for tasks assessed   Attention Focused   Focused Attention Appears intact   Memory Appears intact   Awareness Appears intact   Problem Solving Appears intact     Observation/Other Assessments   Observations left shoulder  elevation    Focus on Therapeutic Outcomes (FOTO)  FOTO program down      Sensation   Additional Comments Pain radiates into right upper trap      Coordination   Gross Motor Movements are Fluid and Coordinated Yes   Fine Motor Movements are Fluid and Coordinated Yes     AROM   Cervical Flexion 25   Cervical Extension 35   Cervical - Right Rotation 35   Cervical - Left Rotation 40     PROM   Overall PROM Comments Shoulder PROM full     Strength   Right Shoulder Flexion 4/5   Right Shoulder ABduction 4/5   Right Shoulder Internal Rotation 4+/5   Right Shoulder External Rotation 4/5   Left Shoulder Flexion 4/5   Left Shoulder ABduction 4/5   Left Shoulder Internal Rotation 4+/5   Left Shoulder External Rotation 4/5     Palpation   Palpation comment significant spasming of the right upper trap                    [facility] Adult PT Treatment/Exercise - 06/09/16 0001       Neck Exercises: Seated   Other Seated Exercise scapular retraction 2x10    Other Seated Exercise tennis ball trigger point release; cervical rotation 3x each direction      Manual Therapy   Manual therapy comments triggerpoint rlease to uppoer traps, sub occipital release;                  PT Education - 06/08/16 1629    Education provided Yes   Education Details educated patient on relaxation and symptom mangement.    Person(s) Educated Patient   Methods Explanation;Demonstration   Comprehension Verbalized understanding;Returned demonstration;Tactile cues required          PT Short Term Goals - 06/09/16 1334      PT SHORT TERM GOAL #1   Title Patient will increase cervical rotation by 25 degrees to the left    Time 4   Period Weeks   Status New     PT SHORT TERM GOAL #2   Title Patient will incrase left upper extremity strnegth to 5/5    Time 4   Period Weeks   Status New     PT SHORT TERM GOAL #3   Title Patient will be independent with inital HEP.    Time 4   Period Weeks   Status New     PT SHORT TERM GOAL #4   Title Patient will sit for 10 minutes with proper posture without cuing    Time 4   Period Weeks   Status New           PT Long Term Goals - 06/09/16 1356      PT LONG TERM GOAL #1   Title Patient will rotate cervical spine to the left 65 degrees without increase pain in order to improve safety driving    Time 8   Period Weeks   Status New     PT LONG TERM GOAL #2   Title Patient will report 2/10 pain at worst in her vervical spine with upper extrmeity actiivty in order to perfrom ADl's    Time 8   Period Weeks   Status New     PT LONG TERM GOAL #3   Title Patient will be independent with exercise program to imporove poture and decrease upper trap spasming    Time 8  Period Weeks   Status New               Plan - 06/08/16 1631    Clinical Impression Statement Patient is a 56 year old female with cervical spine pain.  She had a cervical fusion in 1995. She presents wtih limited mobility of the cervical spine and decreased ability to turn her head. She would benefit from skilled therapy to improve cervicla mobility and decrease pain. 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    PT Frequency 2x / week   PT Duration 8 weeks   PT Treatment/Interventions ADLs/Self Care Home Management;Cryotherapy;Electrical Stimulation;Iontophoresis 4mg /ml Dexamethasone   PT Next Visit Plan Tens/ e-stim trial, continues with manual therapy. Continue with postural correction.    PT Home Exercise Plan cervical rotation, trigger point release to upper trap, scapular retraction,    Consulted and Agree with Plan of Care Patient      Patient will benefit from skilled therapeutic intervention in order to improve the following deficits and impairments:  Decreased activity tolerance, Decreased mobility, Decreased strength, Impaired sensation, Pain, Increased muscle spasms, Decreased range of motion, Increased fascial restricitons, Decreased safety awareness  Visit Diagnosis: Cervicalgia - Plan: PT plan of care cert/re-cert  Other muscle spasm - Plan: PT plan of care cert/re-cert  Muscle weakness (generalized) - Plan: PT plan of care cert/re-cert      G-Codes - [id number] [id number]    Functional Assessment Tool Used (Outpatient Only) clinical decision making    Functional Limitation Changing and maintaining body position   Changing and Maintaining Body Position Current Status [id number]) At least 60 percent but less than 80 percent impaired, limited or restricted   Changing and Maintaining Body Position Goal Status [id number]) At least 20 percent but less than 40 percent impaired, limited or restricted       Problem List Patient Active Problem List   Diagnosis Date Noted  . Cervical post-laminectomy syndrome 05/09/2016  . Cervical  myofascial pain syndrome 05/09/2016  . DDD (degenerative disc disease), cervical 05/09/2016  . Cervical spondylosis without myelopathy 05/09/2016    Carney Living PT DPT  06/09/2016, 4:21 PM  [facility] [address] [place], [place], [address] Phone: [phone]   Fax:  [phone]  Name: Declan Kinstler MRN: [id number] Date of Birth: 09/05/1960

## 2016-06-15 ENCOUNTER — Ambulatory Visit: Payer: Medicare HMO | Admitting: Physical Therapy

## 2016-06-15 ENCOUNTER — Encounter: Payer: Self-pay | Admitting: Physical Therapy

## 2016-06-15 DIAGNOSIS — M542 Cervicalgia: Secondary | ICD-10-CM | POA: Diagnosis not present

## 2016-06-15 DIAGNOSIS — M62838 Other muscle spasm: Secondary | ICD-10-CM | POA: Diagnosis not present

## 2016-06-15 DIAGNOSIS — M6281 Muscle weakness (generalized): Secondary | ICD-10-CM

## 2016-06-15 NOTE — Therapy (Signed)
[place], [place], [address] Phone: [phone]   Fax:  [phone]  Physical Therapy Treatment  Patient Details  Name: Amanda Cordova MRN: [id number] Date of Birth: 11-Jan-1961 Referring Provider: Dr Jone Baseman   Encounter Date: 06/15/2016      PT End of Session - 06/15/16 1613    Visit Number 2   Number of Visits 16   Date for PT Re-Evaluation 08/03/16   Authorization Type medicare [employer] 40.00   PT Start Time 0845   PT Stop Time 0926   PT Time Calculation (min) 41 min   Activity Tolerance Patient tolerated treatment well   Behavior During Therapy [facility] for tasks assessed/performed      Past Medical History:  Diagnosis Date  . Hypertension     Past Surgical History:  Procedure Laterality Date  . Brain Neuroma  2011  . cerical fusion  1995    There were no vitals filed for this visit.      Subjective Assessment - 06/15/16 1607    Subjective Patient reports that her pain has been better. She feels like her neck is getting a little looser. She contrinues to have pain that is mostly on the right.    Pertinent History cervical fusion in 1995    Limitations Lifting;House hold activities   How long can you sit comfortably? N/A   How long can you stand comfortably? N/A    How long can you walk comfortably? N/A    Diagnostic tests Severe disc space narrowing at C2-3 and C3-4. Nerve root impingement at C4   Currently in Pain? Yes   Pain Score 5    Pain Location Neck   Pain Orientation Right   Pain Descriptors / Indicators Aching   Pain Type Chronic pain   Pain Onset More than a month ago   Pain Frequency Constant   Aggravating Factors  turning the head to the left and the right; rain    Pain Relieving Factors heat    Effect of Pain on Daily Activities difficulty perfroming ADL's                                  PT Education - 06/15/16 1613    Education  provided Yes   Education Details relaxation techniques, rationale behind IASTYM    Person(s) Educated Patient   Methods Explanation;Demonstration   Comprehension Verbalized understanding;Returned demonstration;Tactile cues required          PT Short Term Goals - 06/15/16 1616      PT SHORT TERM GOAL #1   Title Patient will increase cervical rotation by 25 degrees to the left    Time 4   Period Weeks   Status On-going     PT SHORT TERM GOAL #2   Title Patient will incrase left upper extremity strnegth to 5/5    Time 4   Period Weeks   Status On-going     PT SHORT TERM GOAL #3   Title Patient will be independent with inital HEP.    Time 4   Period Weeks   Status On-going     PT SHORT TERM GOAL #4   Title Patient will sit for 10 minutes with proper posture without cuing    Time 4   Period Weeks   Status On-going           PT Long Term Goals -  06/09/16 1356      PT LONG TERM GOAL #1   Title Patient will rotate cervical spine to the left 65 degrees without increase pain in order to improve safety driving    Time 8   Period Weeks   Status New     PT LONG TERM GOAL #2   Title Patient will report 2/10 pain at worst in her vervical spine with upper extrmeity actiivty in order to perfrom ADl's    Time 8   Period Weeks   Status New     PT LONG TERM GOAL #3   Title Patient will be independent with exercise program to imporove poture and decrease upper trap spasming    Time 8   Period Weeks   Status New               Plan - 06/15/16 1614    Clinical Impression Statement Per visual inspection the patient had improved cervical rotation. She reported feeling improved pain. Min cuing required for new exercises. No increase in pain. Reita May will continue to progress as tolerated.    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    PT Frequency 2x / week   PT Duration 8 weeks   PT  Treatment/Interventions ADLs/Self Care Home Management;Cryotherapy;Electrical Stimulation;Iontophoresis 4mg /ml Dexamethasone   PT Next Visit Plan Tens/ e-stim trial, continues with manual therapy. Continue with postural correction. add supine wand flexion Continue with IASTYM and soft tissue mobilization to the upper traps    PT Home Exercise Plan cervical rotation, trigger point release to upper trap, scapular retraction,    Consulted and Agree with Plan of Care Patient      Patient will benefit from skilled therapeutic intervention in order to improve the following deficits and impairments:  Decreased activity tolerance, Decreased mobility, Decreased strength, Impaired sensation, Pain, Increased muscle spasms, Decreased range of motion, Increased fascial restricitons, Decreased safety awareness  Visit Diagnosis: Cervicalgia  Other muscle spasm  Muscle weakness (generalized)     Problem List Patient Active Problem List   Diagnosis Date Noted  . Cervical post-laminectomy syndrome 05/09/2016  . Cervical myofascial pain syndrome 05/09/2016  . DDD (degenerative disc disease), cervical 05/09/2016  . Cervical spondylosis without myelopathy 05/09/2016    Carney Living PT DPT  06/15/2016, 4:17 PM  [facility] [address] [place], [place], [address] Phone: [phone]   Fax:  [phone]  Name: Amanda Cordova MRN: [id number] Date of Birth: 07-Mar-1961

## 2016-06-21 ENCOUNTER — Ambulatory Visit: Payer: Medicare HMO | Attending: Physical Medicine & Rehabilitation | Admitting: Physical Therapy

## 2016-06-21 ENCOUNTER — Encounter: Payer: Self-pay | Admitting: Physical Therapy

## 2016-06-21 DIAGNOSIS — M62830 Muscle spasm of back: Secondary | ICD-10-CM | POA: Insufficient documentation

## 2016-06-21 DIAGNOSIS — M542 Cervicalgia: Secondary | ICD-10-CM | POA: Insufficient documentation

## 2016-06-21 DIAGNOSIS — M6281 Muscle weakness (generalized): Secondary | ICD-10-CM | POA: Diagnosis not present

## 2016-06-21 DIAGNOSIS — M5442 Lumbago with sciatica, left side: Secondary | ICD-10-CM | POA: Diagnosis not present

## 2016-06-21 DIAGNOSIS — R262 Difficulty in walking, not elsewhere classified: Secondary | ICD-10-CM | POA: Diagnosis not present

## 2016-06-21 DIAGNOSIS — M545 Low back pain: Secondary | ICD-10-CM | POA: Diagnosis not present

## 2016-06-21 DIAGNOSIS — M62838 Other muscle spasm: Secondary | ICD-10-CM | POA: Diagnosis not present

## 2016-06-22 ENCOUNTER — Ambulatory Visit
Admission: RE | Admit: 2016-06-22 | Discharge: 2016-06-22 | Disposition: A | Discharge status: Home / Self Care | Payer: Medicare HMO | Source: Ambulatory Visit | Attending: Family Medicine | Admitting: Family Medicine

## 2016-06-22 ENCOUNTER — Other Ambulatory Visit: Payer: Self-pay | Admitting: Family Medicine

## 2016-06-22 DIAGNOSIS — M545 Low back pain: Secondary | ICD-10-CM

## 2016-06-22 DIAGNOSIS — S3992XA Unspecified injury of lower back, initial encounter: Secondary | ICD-10-CM | POA: Diagnosis not present

## 2016-06-22 DIAGNOSIS — W19XXXA Unspecified fall, initial encounter: Secondary | ICD-10-CM

## 2016-06-22 DIAGNOSIS — S79911A Unspecified injury of right hip, initial encounter: Secondary | ICD-10-CM | POA: Diagnosis not present

## 2016-06-22 NOTE — Therapy (Addendum)
[place], [place], [address] Phone: [phone]   Fax:  [phone]  Physical Therapy Treatment  Patient Details  Name: Amanda Cordova MRN: [id number] Date of Birth: 08/11/1960 Referring Provider: Dr Jone Baseman   Encounter Date: 06/21/2016      PT End of Session - 06/22/16 1118    Visit Number 3   Number of Visits 16   Date for PT Re-Evaluation 08/03/16   Authorization Type medicare [employer] 40.00   PT Start Time 0845   PT Stop Time 0931   PT Time Calculation (min) 46 min   Activity Tolerance Patient tolerated treatment well   Behavior During Therapy [facility] for tasks assessed/performed      Past Medical History:  Diagnosis Date  . Hypertension     Past Surgical History:  Procedure Laterality Date  . Brain Neuroma  2011  . cerical fusion  1995    There were no vitals filed for this visit.      Subjective Assessment - 06/22/16 1113    Subjective Patient fell a day after therapy. Since that point she has had lower back pain and cervical pain. She has increased pain when truning her head to the right and she has had increased pain in her lower back with walking. Patient is interested in getting a TENS unit.    Pertinent History cervical fusion in 1995    Limitations Lifting;House hold activities   How long can you sit comfortably? N/A   How long can you stand comfortably? N/A    How long can you walk comfortably? N/A    Diagnostic tests Severe disc space narrowing at C2-3 and C3-4. Nerve root impingement at C4   Currently in Pain? Yes   Pain Score 6    Pain Location Neck   Pain Orientation Right   Pain Descriptors / Indicators Aching   Pain Onset More than a month ago   Pain Frequency Constant   Aggravating Factors  turning her head to rh    Pain Relieving Factors heat    Effect of Pain on Daily Activities difficulty perfroming ADL's    Multiple Pain Sites Yes   Pain Score 8   Pain  Location Back   Pain Orientation Left;Right   Pain Descriptors / Indicators Aching   Pain Type Chronic pain   Pain Onset More than a month ago   Pain Frequency Constant   Aggravating Factors  walking    Pain Relieving Factors rest    Effect of Pain on Daily Activities difficulty walking and perfroming IADL's                 Treatment:  Self care: Reviewed proper use of Tens unit. Demonstrated pad placement. Reviewed rationale behind tens. Patient demonstrated proper use and pad placement independently.   Manual therapy: IASTYM to upper traps, Trigger point release to upper traps; light manual traction to decrease nerve impingement.   Exercises: reviewed decompression position with light press into the table; cervical rotation 3x each; pulleys to improve shoulder movement; reviewed scap retraction 2x10 yellow; shoulder extension 2x10 yellow                  PT Education - 06/22/16 1118    Education provided Yes   Education Details relaxation technique    Person(s) Educated Patient   Methods Explanation;Demonstration   Comprehension Verbalized understanding;Returned demonstration;Tactile cues required          PT Short Term  Goals - 06/15/16 1616      PT SHORT TERM GOAL #1   Title Patient will increase cervical rotation by 25 degrees to the left    Time 4   Period Weeks   Status On-going     PT SHORT TERM GOAL #2   Title Patient will incrase left upper extremity strnegth to 5/5    Time 4   Period Weeks   Status On-going     PT SHORT TERM GOAL #3   Title Patient will be independent with inital HEP.    Time 4   Period Weeks   Status On-going     PT SHORT TERM GOAL #4   Title Patient will sit for 10 minutes with proper posture without cuing    Time 4   Period Weeks   Status On-going           PT Long Term Goals - 06/09/16 1356      PT LONG TERM GOAL #1   Title Patient will rotate cervical spine to the left 65 degrees without increase  pain in order to improve safety driving    Time 8   Period Weeks   Status New     PT LONG TERM GOAL #2   Title Patient will report 2/10 pain at worst in her vervical spine with upper extrmeity actiivty in order to perfrom ADl's    Time 8   Period Weeks   Status New     PT LONG TERM GOAL #3   Title Patient will be independent with exercise program to imporove poture and decrease upper trap spasming    Time 8   Period Weeks   Status New               Plan - 06/22/16 1119    Clinical Impression Statement Patient was given a tens and therapy reviewed the proper use of a TENS. She continues to have spasming of the right upper trpa. She reported prior to the fall she felt like her pain was improving. Therapy encouraged her to continue with strengthening at home.    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    PT Frequency 2x / week   PT Duration 8 weeks   PT Treatment/Interventions ADLs/Self Care Home Management;Cryotherapy;Electrical Stimulation;Iontophoresis 4mg /ml Dexamethasone   PT Next Visit Plan Tens/ e-stim trial, continues with manual therapy. Continue with postural correction. add supine wand flexion Continue with IASTYM and soft tissue mobilization to the upper traps    PT Home Exercise Plan cervical rotation, trigger point release to upper trap, scapular retraction,    Consulted and Agree with Plan of Care Patient      Patient will benefit from skilled therapeutic intervention in order to improve the following deficits and impairments:  Decreased activity tolerance, Decreased mobility, Decreased strength, Impaired sensation, Pain, Increased muscle spasms, Decreased range of motion, Increased fascial restricitons, Decreased safety awareness  Visit Diagnosis: Cervicalgia  Other muscle spasm  Muscle weakness (generalized)     Problem List Patient Active Problem List   Diagnosis Date Noted  . Cervical  post-laminectomy syndrome 05/09/2016  . Cervical myofascial pain syndrome 05/09/2016  . DDD (degenerative disc disease), cervical 05/09/2016  . Cervical spondylosis without myelopathy 05/09/2016    Carney Living PT DPT  06/22/2016, 11:22 AM  [facility] [address] [place], [place], [address] Phone: [phone]   Fax:  [phone]  Name: Amanda Cordova MRN: [id number] Date of Birth: 04-Sep-1960

## 2016-06-28 ENCOUNTER — Ambulatory Visit: Payer: Medicare HMO | Admitting: Physical Therapy

## 2016-07-05 ENCOUNTER — Ambulatory Visit: Payer: Medicare HMO | Admitting: Physical Therapy

## 2016-07-05 ENCOUNTER — Encounter: Payer: Self-pay | Admitting: Physical Therapy

## 2016-07-05 DIAGNOSIS — R262 Difficulty in walking, not elsewhere classified: Secondary | ICD-10-CM

## 2016-07-05 DIAGNOSIS — M62838 Other muscle spasm: Secondary | ICD-10-CM | POA: Diagnosis not present

## 2016-07-05 DIAGNOSIS — M62830 Muscle spasm of back: Secondary | ICD-10-CM | POA: Diagnosis not present

## 2016-07-05 DIAGNOSIS — M6281 Muscle weakness (generalized): Secondary | ICD-10-CM

## 2016-07-05 DIAGNOSIS — M5442 Lumbago with sciatica, left side: Secondary | ICD-10-CM

## 2016-07-05 DIAGNOSIS — M542 Cervicalgia: Secondary | ICD-10-CM | POA: Diagnosis not present

## 2016-07-06 NOTE — Therapy (Signed)
[place], [place], [address] Phone: [phone]   Fax:  [phone]  Physical Therapy Evaluation  Patient Details  Name: Amanda Cordova MRN: [id number] Date of Birth: 1960/07/17 Referring Provider: Dr Marda Stalker   Encounter Date: 07/05/2016      PT End of Session - 07/06/16 1010    Visit Number 4   Number of Visits 16   Date for PT Re-Evaluation 08/31/16   Authorization Type medicare [employer] 40.00   PT Start Time 0845   PT Stop Time 0933   PT Time Calculation (min) 48 min   Activity Tolerance Patient tolerated treatment well   Behavior During Therapy [facility] for tasks assessed/performed      Past Medical History:  Diagnosis Date  . Hypertension     Past Surgical History:  Procedure Laterality Date  . Brain Neuroma  2011  . cerical fusion  1995    There were no vitals filed for this visit.       Subjective Assessment - 07/05/16 0854    Subjective Patient had a fall 3 weeks ago that caused her low back pain. Her pain started on the right but has since traveled to th the right side. Her pain is worse with all prolonged positining. The pain radiates into her left upper buttock.    Pertinent History cervical fusion in 1995    Limitations Lifting;House hold activities   How long can you sit comfortably? N/A   How long can you stand comfortably? N/A    How long can you walk comfortably? N/A    Diagnostic tests Severe disc space narrowing at C2-3 and C3-4. Nerve root impingement at C4   Currently in Pain? Yes   Pain Score 4    Pain Location Neck   Pain Orientation Right   Pain Descriptors / Indicators Aching   Pain Type Chronic pain   Pain Onset More than a month ago   Pain Frequency Constant   Aggravating Factors  turning the head    Pain Relieving Factors heat    Effect of Pain on Daily Activities difficulty perfroming ADL's    Pain Score 7   Pain Location Back   Pain Orientation  Left;Right   Pain Descriptors / Indicators Aching   Pain Type Chronic pain   Pain Onset More than a month ago   Pain Frequency Constant   Aggravating Factors  walking    Pain Relieving Factors rest    Effect of Pain on Daily Activities difficulty walking and performing             [facility] PT Assessment - 07/06/16 0001      Assessment   Medical Diagnosis Left sided cervical spine pain/ Low back pain     Referring Provider Dr Marda Stalker    Onset Date/Surgical Date --  1 month prior    Hand Dominance Right   Next MD Visit None Scheduled    Prior Therapy None      Precautions   Precautions None     Balance Screen   Has the patient fallen in the past 6 months Yes   How many times? 1   Has the patient had a decrease in activity level because of a fear of falling?  No   Is the patient reluctant to leave their home because of a fear of falling?  No     Home Environment   Additional Comments Nothing signficant  Prior Function   Level of Independence Independent   Vocation On disability   Leisure Nothing      Cognition   Overall Cognitive Status Within Functional Limits for tasks assessed   Attention Focused   Focused Attention Appears intact   Memory Appears intact   Awareness Appears intact   Problem Solving Appears intact     Observation/Other Assessments   Observations left shoulder elevation      Sensation   Additional Comments pain radiates into her left leg      Coordination   Gross Motor Movements are Fluid and Coordinated Yes   Fine Motor Movements are Fluid and Coordinated Yes     AROM   AROM Assessment Site Lumbar   Lumbar Flexion 50% limited with radicualr pain    Lumbar Extension 50% limited with radicualr pain    Lumbar - Left Side Bend increased symptoms      Strength   Strength Assessment Site Hip;Knee   Right/Left Hip Right;Left   Right Hip Flexion 4/5   Right Hip ABduction 4/5   Left Hip Flexion 3+/5   Left Hip ABduction 3+/5    Left Hip ADduction 4+/5     Palpation   Palpation comment Significant spasming from L3 -L5 paraspianls; spasming in left quadratus                    [facility] Adult PT Treatment/Exercise - 07/06/16 0001      Lumbar Exercises: Stretches   Lower Trunk Rotation Limitations x10    Standing Side Bend Limitations seated side bend to the right to open up left and stretch quadratus 2x10sec    Piriformis Stretch Limitations 2x20sec eahc side to stretch gluteal      Modalities   Modalities Electrical Stimulation     Moist Heat Therapy   Number Minutes Moist Heat 10 Minutes  with stim    Moist Heat Location Lumbar Spine     Electrical Stimulation   Electrical Stimulation Location Lumbar spine    Electrical Stimulation Action IFC   Electrical Stimulation Parameters to tolerance x10 min    Electrical Stimulation Goals Pain     Manual Therapy   Manual therapy comments trigger point release to left lumbar paraspinals and quadrtus; LAD to the left lower extremity. Patient nored decreased pain with LAD                 PT Education - 07/05/16 0857    Education provided Yes   Education Details imporoitance of stretching and core strengthening    Person(s) Educated Patient   Methods Explanation;Demonstration   Comprehension Verbalized understanding;Returned demonstration;Verbal cues required          PT Short Term Goals - 07/06/16 1016      PT SHORT TERM GOAL #1   Title Patient will increase cervical rotation by 25 degrees to the left    Time 4   Period Weeks     PT SHORT TERM GOAL #2   Title Patient will incrase left upper extremity strnegth to 5/5    Time 4   Period Weeks   Status On-going     PT SHORT TERM GOAL #3   Title Patient will be independent with inital HEP for cerivcal and lumbar spine    Time 4   Period Weeks   Status Partially Met     PT SHORT TERM GOAL #4   Title Patient will sit for 10 minutes with proper posture without cuing  Time 4    Period Weeks   Status On-going     PT SHORT TERM GOAL #5   Title Patient will report 2/10 pain at worst in the middle of her lower back    Time 4   Period Weeks   Status New     Additional Short Term Goals   Additional Short Term Goals Yes     PT SHORT TERM GOAL #6   Title Patrient will report decreased tenderness to palpation in her lumbar spine    Time 4   Period Weeks   Status New     PT SHORT TERM GOAL #7   Title Patient will increase gross bilateral lower extremity strength to 5/5   Time 4   Period Weeks   Status New           PT Long Term Goals - 06/09/16 1356      PT LONG TERM GOAL #1   Title Patient will rotate cervical spine to the left 65 degrees without increase pain in order to improve safety driving    Time 8   Period Weeks   Status New     PT LONG TERM GOAL #2   Title Patient will report 2/10 pain at worst in her vervical spine with upper extrmeity actiivty in order to perfrom ADl's    Time 8   Period Weeks   Status New     PT LONG TERM GOAL #3   Title Patient will be independent with exercise program to imporove poture and decrease upper trap spasming    Time 8   Period Weeks   Status New               Plan - 07/06/16 1012    Clinical Impression Statement Patient presents with left sided lower back pain that increases with lumbar flexion and left sidebending. Signs and symptoms are consistent with a lumbar disc buldge. He pain is radiating into her buttock. She has spasming in her lower back. She reported improved pain with manual therapy and e-stim. She has a tens unit at home. She was given strengthening and stretching exercises for home. She had no increased pain with HEP. Therapy will continue to work on her neck and back.     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    PT Frequency 2x / week   PT Duration 8 weeks   PT Treatment/Interventions ADLs/Self Care Home  Management;Cryotherapy;Electrical Stimulation;Iontophoresis 55m/ml Dexamethasone   PT Next Visit Plan Tens/ e-stim trial, continues with manual therapy. Continue with postural correction. add supine wand flexion Continue with IASTYM and soft tissue mobilization to the upper traps    PT Home Exercise Plan cervical rotation, trigger point release to upper trap, scapular retraction,    Consulted and Agree with Plan of Care Patient      Patient will benefit from skilled therapeutic intervention in order to improve the following deficits and impairments:  Decreased activity tolerance, Decreased mobility, Decreased strength, Impaired sensation, Pain, Increased muscle spasms, Decreased range of motion, Increased fascial restricitons, Decreased safety awareness  Visit Diagnosis: Acute left-sided low back pain with left-sided sciatica - Plan: PT plan of care cert/re-cert  Cervicalgia - Plan: PT plan of care cert/re-cert  Other muscle spasm - Plan: PT plan of care cert/re-cert  Muscle weakness (generalized) - Plan: PT plan of care cert/re-cert  Muscle spasm of back - Plan: PT plan of care cert/re-cert  Difficulty in walking, not elsewhere classified - Plan: PT plan of care cert/re-cert     Problem List Patient Active Problem List   Diagnosis Date Noted  . Cervical post-laminectomy syndrome 05/09/2016  . Cervical myofascial pain syndrome 05/09/2016  . DDD (degenerative disc disease), cervical 05/09/2016  . Cervical spondylosis without myelopathy 05/09/2016    Carney Living PT DPT  07/06/2016, 10:27 AM  [facility] [address] [place], [place], [address] Phone: [phone]   Fax:  [phone]  Name: Amanda Cordova MRN: [id number] Date of Birth: 08/22/1960

## 2016-07-11 ENCOUNTER — Ambulatory Visit: Payer: Medicare HMO | Admitting: Physical Therapy

## 2016-07-11 DIAGNOSIS — I10 Essential (primary) hypertension: Secondary | ICD-10-CM | POA: Diagnosis not present

## 2016-07-11 DIAGNOSIS — R262 Difficulty in walking, not elsewhere classified: Secondary | ICD-10-CM | POA: Diagnosis not present

## 2016-07-11 DIAGNOSIS — M62838 Other muscle spasm: Secondary | ICD-10-CM

## 2016-07-11 DIAGNOSIS — R2689 Other abnormalities of gait and mobility: Secondary | ICD-10-CM | POA: Diagnosis not present

## 2016-07-11 DIAGNOSIS — M6281 Muscle weakness (generalized): Secondary | ICD-10-CM

## 2016-07-11 DIAGNOSIS — F17210 Nicotine dependence, cigarettes, uncomplicated: Secondary | ICD-10-CM | POA: Diagnosis not present

## 2016-07-11 DIAGNOSIS — G4733 Obstructive sleep apnea (adult) (pediatric): Secondary | ICD-10-CM | POA: Diagnosis not present

## 2016-07-11 DIAGNOSIS — M62830 Muscle spasm of back: Secondary | ICD-10-CM

## 2016-07-11 DIAGNOSIS — M5442 Lumbago with sciatica, left side: Secondary | ICD-10-CM

## 2016-07-11 DIAGNOSIS — M542 Cervicalgia: Secondary | ICD-10-CM | POA: Diagnosis not present

## 2016-07-11 DIAGNOSIS — M5030 Other cervical disc degeneration, unspecified cervical region: Secondary | ICD-10-CM | POA: Diagnosis not present

## 2016-07-11 DIAGNOSIS — Z6841 Body Mass Index (BMI) 40.0 and over, adult: Secondary | ICD-10-CM | POA: Diagnosis not present

## 2016-07-11 NOTE — Therapy (Addendum)
[place], [place], [address] Phone: [phone]   Fax:  [phone]  Physical Therapy Treatment  Patient Details  Name: Dayanne Yiu MRN: [id number] Date of Birth: 24-May-1960 Referring Provider: Dr Marda Stalker   Encounter Date: 07/11/2016      PT End of Session - 07/11/16 1531    Visit Number 5   Number of Visits 16   Date for PT Re-Evaluation 08/31/16   Authorization Type medicare [employer] 40.00   PT Start Time 1330   PT Stop Time 1411   PT Time Calculation (min) 41 min   Activity Tolerance Patient tolerated treatment well   Behavior During Therapy [facility] for tasks assessed/performed      Past Medical History:  Diagnosis Date  . Hypertension     Past Surgical History:  Procedure Laterality Date  . Brain Neuroma  2011  . cerical fusion  1995    There were no vitals filed for this visit.      Subjective Assessment - 07/11/16 1330    Subjective Patient has had a little more neck pain over the past few days. Her neck has been giving her a headache.    Pertinent History cervical fusion in 1995    Limitations Lifting;House hold activities   How long can you sit comfortably? N/A   How long can you stand comfortably? N/A    How long can you walk comfortably? N/A    Diagnostic tests Severe disc space narrowing at C2-3 and C3-4. Nerve root impingement at C4   Currently in Pain? Yes   Pain Score 7    Pain Location Neck   Pain Descriptors / Indicators Aching   Pain Type Chronic pain   Pain Onset More than a month ago   Pain Frequency Constant   Aggravating Factors  turning    Pain Relieving Factors heat   Effect of Pain on Daily Activities difficulty perfroming ADL's    Multiple Pain Sites Yes   Pain Score 2   Pain Location Back   Pain Orientation Left;Right   Pain Descriptors / Indicators Aching   Pain Type Chronic pain   Pain Onset More than a month ago   Pain Frequency Constant    Aggravating Factors  walking    Pain Relieving Factors rest   Effect of Pain on Daily Activities difficulty walking               Treatment   Manual therapy: seated IASTYM to bilateral upper traps, gentle manual traction to cervical spine, sub-occipital release to upper traps,   There-ex: scapular retractions 2x10; pulleys 3 min to improve flexion, upper trap stretch 2x20sec, levator stretch 3x20 sec                  PT Education - 07/11/16 1333    Education provided Yes   Education Details continue with strethcing and strengthening.    Person(s) Educated Patient   Methods Explanation;Demonstration   Comprehension Verbalized understanding;Returned demonstration;Verbal cues required          PT Short Term Goals - 07/11/16 1533      PT SHORT TERM GOAL #1   Title Patient will increase cervical rotation by 25 degrees to the left    Time 4   Period Weeks   Status On-going     PT SHORT TERM GOAL #2   Title Patient will incrase left upper extremity strength to 5/5    Time 4  Period Weeks   Status On-going     PT SHORT TERM GOAL #3   Title Patient will be independent with inital HEP for cerivcal and lumbar spine    Time 4   Period Weeks   Status On-going     PT SHORT TERM GOAL #4   Title Patient will sit for 10 minutes with proper posture without cuing    Time 4   Period Weeks   Status On-going     PT SHORT TERM GOAL #5   Title Patient will report 2/10 pain at worst in the middle of her lower back    Time 4   Period Weeks   Status On-going     PT SHORT TERM GOAL #6   Title Patrient will report decreased tenderness to palpation in her lumbar spine    Time 4   Period Weeks   Status On-going     PT SHORT TERM GOAL #7   Title Patient will increase gross bilateral lower extremity strength to 5/5   Time 4   Period Weeks   Status On-going           PT Long Term Goals - 06/09/16 1356      PT LONG TERM GOAL #1   Title Patient will  rotate cervical spine to the left 65 degrees without increase pain in order to improve safety driving    Time 8   Period Weeks   Status New     PT LONG TERM GOAL #2   Title Patient will report 2/10 pain at worst in her vervical spine with upper extrmeity actiivty in order to perfrom ADl's    Time 8   Period Weeks   Status New     PT LONG TERM GOAL #3   Title Patient will be independent with exercise program to imporove poture and decrease upper trap spasming    Time 8   Period Weeks   Status New               Plan - 07/11/16 1532    Clinical Impression Statement Therapy focused on her neck today. she reports her back has been much better. She was tight after the last visit but it improved with treatment. She had a significant improvement in cervical rotation in sitting. therapy reviewed postrual exercises. she had no increase in pain.    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    PT Frequency 2x / week   PT Duration 8 weeks   PT Treatment/Interventions ADLs/Self Care Home Management;Cryotherapy;Electrical Stimulation;Iontophoresis 4mg /ml Dexamethasone   PT Next Visit Plan Tens/ e-stim trial, continues with manual therapy. Continue with postural correction. add supine wand flexion Continue with IASTYM and soft tissue mobilization to the upper traps    PT Home Exercise Plan cervical rotation, trigger point release to upper trap, scapular retraction,    Consulted and Agree with Plan of Care Patient      Patient will benefit from skilled therapeutic intervention in order to improve the following deficits and impairments:  Decreased activity tolerance, Decreased mobility, Decreased strength, Impaired sensation, Pain, Increased muscle spasms, Decreased range of motion, Increased fascial restricitons, Decreased safety awareness  Visit Diagnosis: Cervicalgia  Other muscle spasm  Muscle weakness (generalized)  Acute  left-sided low back pain with left-sided sciatica  Muscle spasm of back  Difficulty in walking, not elsewhere classified     Problem List Patient Active Problem List  Diagnosis Date Noted  . Cervical post-laminectomy syndrome 05/09/2016  . Cervical myofascial pain syndrome 05/09/2016  . DDD (degenerative disc disease), cervical 05/09/2016  . Cervical spondylosis without myelopathy 05/09/2016    Carney Living PT DPT  07/11/2016, 3:35 PM  [facility] [address] [place], [place], [address] Phone: [phone]   Fax:  [phone]  Name: Dionisia Pacholski MRN: [id number] Date of Birth: March 02, 1961

## 2016-07-21 ENCOUNTER — Ambulatory Visit: Payer: Medicare HMO | Admitting: Physical Therapy

## 2016-07-26 ENCOUNTER — Ambulatory Visit: Payer: Medicare HMO | Attending: Physical Medicine & Rehabilitation | Admitting: Physical Therapy

## 2016-07-26 DIAGNOSIS — M6281 Muscle weakness (generalized): Secondary | ICD-10-CM | POA: Insufficient documentation

## 2016-07-26 DIAGNOSIS — R262 Difficulty in walking, not elsewhere classified: Secondary | ICD-10-CM | POA: Diagnosis not present

## 2016-07-26 DIAGNOSIS — M542 Cervicalgia: Secondary | ICD-10-CM | POA: Diagnosis not present

## 2016-07-26 DIAGNOSIS — M62838 Other muscle spasm: Secondary | ICD-10-CM | POA: Insufficient documentation

## 2016-07-26 DIAGNOSIS — M62830 Muscle spasm of back: Secondary | ICD-10-CM | POA: Diagnosis not present

## 2016-07-26 DIAGNOSIS — M5442 Lumbago with sciatica, left side: Secondary | ICD-10-CM

## 2016-07-26 NOTE — Therapy (Addendum)
[place], [place], [address] Phone: [phone]   Fax:  [phone]  Physical Therapy Treatment  Patient Details  Name: Amanda Cordova MRN: [id number] Date of Birth: 11-23-1960 Referring Provider: Dr Marda Stalker   Encounter Date: 07/26/2016      PT End of Session - 07/26/16 1556    Visit Number 6   Number of Visits 16   Date for PT Re-Evaluation 08/31/16   Authorization Type medicare [employer] 40.00   PT Start Time 0815   PT Stop Time 0902   PT Time Calculation (min) 47 min   Activity Tolerance Patient tolerated treatment well   Behavior During Therapy [facility] for tasks assessed/performed      Past Medical History:  Diagnosis Date  . Hypertension     Past Surgical History:  Procedure Laterality Date  . Brain Neuroma  2011  . cerical fusion  1995    There were no vitals filed for this visit.      Subjective Assessment - 07/26/16 0850    Subjective (P)  Patient reports the pain in her neck has not changed much. She feels like her pain is more tolerable but she continues to have consistent pain. Her back is feeling better. She feels like after the next visit she will likley be able to do her exercises on her own.    Pertinent History (P)  cervical fusion in 1995    Limitations (P)  Lifting;House hold activities   How long can you sit comfortably? (P)  N/A   How long can you stand comfortably? (P)  N/A    How long can you walk comfortably? (P)  N/A    Diagnostic tests (P)  Severe disc space narrowing at C2-3 and C3-4. Nerve root impingement at C4   Currently in Pain? (P)  Yes   Pain Score (P)  5    Pain Location (P)  Neck   Pain Orientation (P)  Right   Pain Descriptors / Indicators (P)  Aching   Pain Type (P)  Chronic pain   Pain Onset (P)  More than a month ago   Pain Frequency (P)  Constant                         [facility] Adult PT Treatment/Exercise - 07/26/16 0001       Neck Exercises: Seated   Other Seated Exercise scalene stretch stretch for the right mod cuing for technie 3x15 sec holds/     Neck Exercises: Supine   Other Supine Exercise supine bilateral ER yellow 2x10; Supine shoulder abduction yellow 2x10; supine flexion with abduction 2x10 yellow,     Manual therapy: IAYSYM to upper trap; manual trigger point release to upper traps and paraspinals, manual traction to upper traps            PT Education - 07/26/16 1555    Education provided Yes   Education Details continue with exercises at home; rationale behind exrcise   Person(s) Educated Patient   Methods Explanation;Demonstration   Comprehension Verbalized understanding;Returned demonstration;Verbal cues required          PT Short Term Goals - 07/11/16 1533      PT SHORT TERM GOAL #1   Title Patient will increase cervical rotation by 25 degrees to the left    Time 4   Period Weeks   Status On-going     PT SHORT TERM GOAL #2  Title Patient will incrase left upper extremity strength to 5/5    Time 4   Period Weeks   Status On-going     PT SHORT TERM GOAL #3   Title Patient will be independent with inital HEP for cerivcal and lumbar spine    Time 4   Period Weeks   Status On-going     PT SHORT TERM GOAL #4   Title Patient will sit for 10 minutes with proper posture without cuing    Time 4   Period Weeks   Status On-going     PT SHORT TERM GOAL #5   Title Patient will report 2/10 pain at worst in the middle of her lower back    Time 4   Period Weeks   Status On-going     PT SHORT TERM GOAL #6   Title Patrient will report decreased tenderness to palpation in her lumbar spine    Time 4   Period Weeks   Status On-going     PT SHORT TERM GOAL #7   Title Patient will increase gross bilateral lower extremity strength to 5/5   Time 4   Period Weeks   Status On-going           PT Long Term Goals - 06/09/16 1356      PT LONG TERM GOAL #1   Title  Patient will rotate cervical spine to the left 65 degrees without increase pain in order to improve safety driving    Time 8   Period Weeks   Status New     PT LONG TERM GOAL #2   Title Patient will report 2/10 pain at worst in her vervical spine with upper extrmeity actiivty in order to perfrom ADl's    Time 8   Period Weeks   Status New     PT LONG TERM GOAL #3   Title Patient will be independent with exercise program to imporove poture and decrease upper trap spasming    Time 8   Period Weeks   Status New               Plan - 07/26/16 1557    Clinical Impression Statement Patient feels like she is reaching her max potential for therapy. She feels her pain in her neck is still there but it is manageable. She feels like her pain is more centralized in her neck. She will come for one more visit then likely discharge to HEP. Therapy added supine shoulder strengthening exercises.    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    PT Frequency 2x / week   PT Duration 8 weeks   PT Treatment/Interventions ADLs/Self Care Home Management;Cryotherapy;Electrical Stimulation;Iontophoresis 4mg /ml Dexamethasone   PT Next Visit Plan Tens/ e-stim trial, continues with manual therapy. Continue with postural correction. add supine wand flexion Continue with IASTYM and soft tissue mobilization to the upper traps    PT Home Exercise Plan cervical rotation, trigger point release to upper trap, scapular retraction,    Consulted and Agree with Plan of Care Patient      Patient will benefit from skilled therapeutic intervention in order to improve the following deficits and impairments:  Decreased activity tolerance, Decreased mobility, Decreased strength, Impaired sensation, Pain, Increased muscle spasms, Decreased range of motion, Increased fascial restricitons, Decreased safety awareness  Visit Diagnosis: Cervicalgia  Other muscle  spasm  Muscle weakness (generalized)  Acute left-sided low back pain with  left-sided sciatica  Muscle spasm of back  Difficulty in walking, not elsewhere classified     Problem List Patient Active Problem List   Diagnosis Date Noted  . Cervical post-laminectomy syndrome 05/09/2016  . Cervical myofascial pain syndrome 05/09/2016  . DDD (degenerative disc disease), cervical 05/09/2016  . Cervical spondylosis without myelopathy 05/09/2016    Carney Living PT DPT  07/26/2016, 4:01 PM  [facility] [address] [place], [place], [address] Phone: [phone]   Fax:  [phone]  Name: Walida Cajas MRN: [id number] Date of Birth: Jan 22, 1961

## 2016-08-01 ENCOUNTER — Encounter: Payer: Self-pay | Admitting: Physical Therapy

## 2016-08-01 ENCOUNTER — Ambulatory Visit: Payer: Medicare HMO | Admitting: Physical Therapy

## 2016-08-01 DIAGNOSIS — M62830 Muscle spasm of back: Secondary | ICD-10-CM

## 2016-08-01 DIAGNOSIS — M5442 Lumbago with sciatica, left side: Secondary | ICD-10-CM

## 2016-08-01 DIAGNOSIS — M62838 Other muscle spasm: Secondary | ICD-10-CM

## 2016-08-01 DIAGNOSIS — M542 Cervicalgia: Secondary | ICD-10-CM | POA: Diagnosis not present

## 2016-08-01 DIAGNOSIS — R262 Difficulty in walking, not elsewhere classified: Secondary | ICD-10-CM

## 2016-08-01 DIAGNOSIS — M6281 Muscle weakness (generalized): Secondary | ICD-10-CM

## 2016-08-01 NOTE — Therapy (Signed)
[place], [place], [address] Phone: [phone]   Fax:  [phone]  Physical Therapy Treatment/ Discharge   Patient Details  Name: Amanda Cordova MRN: [id number] Date of Birth: 06-11-1960 Referring Provider: Dr Marda Stalker   Encounter Date: 08/01/2016      PT End of Session - 08/01/16 1017    Visit Number 7   Number of Visits 16   Date for PT Re-Evaluation 08/31/16   Authorization Type medicare [employer] 40.00   PT Start Time 0847   PT Stop Time 0925   PT Time Calculation (min) 38 min   Activity Tolerance Patient tolerated treatment well   Behavior During Therapy [facility] for tasks assessed/performed      Past Medical History:  Diagnosis Date  . Hypertension     Past Surgical History:  Procedure Laterality Date  . Brain Neuroma  2011  . cerical fusion  1995    There were no vitals filed for this visit.      Subjective Assessment - 08/01/16 1013    Subjective Patient reports her pain is ablut the same. It iscetralized into her upper cervical spine but it is always there. She feels conmfortbale with her exercises.    Pertinent History cervical fusion in 1995    Limitations Lifting;House hold activities   How long can you sit comfortably? N/A   How long can you stand comfortably? N/A    How long can you walk comfortably? N/A    Diagnostic tests Severe disc space narrowing at C2-3 and C3-4. Nerve root impingement at C4   Currently in Pain? Yes   Pain Score 7    Pain Location Neck   Pain Orientation Right   Pain Descriptors / Indicators Aching   Pain Type Chronic pain   Pain Onset More than a month ago   Aggravating Factors  turning    Pain Relieving Factors heat    Effect of Pain on Daily Activities difficulty perfroming ADL's                          [facility] Adult PT Treatment/Exercise - 08/01/16 0001      Neck Exercises: Seated   Other Seated Exercise seated  bilateral ER 2x10 yellow, horizontal baduction yellow 2x10;      Neck Exercises: Supine   Other Supine Exercise supine bilateral ER yellow 2x10; Supine shoulder abduction yellow 2x10; supine flexion with abduction 2x10 yellow,    Other Supine Exercise supine flexion 2x10 2lb; supine d2 flexion 2x10      Shoulder Exercises: Sidelying   Other Sidelying Exercises side lying ER 2x10 1 lb      Shoulder Exercises: Standing   Other Standing Exercises standing flexion to 90 degrees     Manual Therapy   Manual therapy comments IASTMY to upper trap, cervical manual traction, upper trap trigger point release;                   PT Short Term Goals - 07/11/16 1533      PT SHORT TERM GOAL #1   Title Patient will increase cervical rotation by 25 degrees to the left    Time 4   Period Weeks   Status On-going     PT SHORT TERM GOAL #2   Title Patient will incrase left upper extremity strength to 5/5    Time 4   Period Weeks   Status On-going  PT SHORT TERM GOAL #3   Title Patient will be independent with inital HEP for cerivcal and lumbar spine    Time 4   Period Weeks   Status On-going     PT SHORT TERM GOAL #4   Title Patient will sit for 10 minutes with proper posture without cuing    Time 4   Period Weeks   Status On-going     PT SHORT TERM GOAL #5   Title Patient will report 2/10 pain at worst in the middle of her lower back    Time 4   Period Weeks   Status On-going     PT SHORT TERM GOAL #6   Title Patrient will report decreased tenderness to palpation in her lumbar spine    Time 4   Period Weeks   Status On-going     PT SHORT TERM GOAL #7   Title Patient will increase gross bilateral lower extremity strength to 5/5   Time 4   Period Weeks   Status On-going           PT Long Term Goals - 06/09/16 1356      PT LONG TERM GOAL #1   Title Patient will rotate cervical spine to the left 65 degrees without increase pain in order to improve safety  driving    Time 8   Period Weeks   Status New     PT LONG TERM GOAL #2   Title Patient will report 2/10 pain at worst in her vervical spine with upper extrmeity actiivty in order to perfrom ADl's    Time 8   Period Weeks   Status New     PT LONG TERM GOAL #3   Title Patient will be independent with exercise program to imporove poture and decrease upper trap spasming    Time 8   Period Weeks   Status New               Plan - 08-05-16 1018    Clinical Impression Statement Therapy reviewed exercises with the patient. She was strongly encouraged to continue with HEP at home. Her back pain has resolved D/C to HEP at this time.    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    PT Frequency 2x / week   PT Duration 8 weeks      Patient will benefit from skilled therapeutic intervention in order to improve the following deficits and impairments:  Decreased activity tolerance, Decreased mobility, Decreased strength, Impaired sensation, Pain, Increased muscle spasms, Decreased range of motion, Increased fascial restricitons, Decreased safety awareness  Visit Diagnosis: Cervicalgia  Other muscle spasm  Muscle weakness (generalized)  Acute left-sided low back pain with left-sided sciatica  Muscle spasm of back  Difficulty in walking, not elsewhere classified       G-Codes - August 05, 2016 1022    Functional Assessment Tool Used (Outpatient Only) clinical decision making    Functional Limitation Changing and maintaining body position   Changing and Maintaining Body Position Current Status (N0272) At least 60 percent but less than 80 percent impaired, limited or restricted   Changing and Maintaining Body Position Goal Status (Z3664) At least 20 percent but less than 40 percent impaired, limited or restricted   Changing and Maintaining Body Position Discharge Status (Q0347) At least 40 percent but less than 60 percent  impaired, limited or restricted     PHYSICAL THERAPY DISCHARGE SUMMARY  Visits  from Start of Care: 7  Current functional level related to goals / functional outcomes: Improved pain but still present  Remaining deficits: Continued cervical pain    Education / Equipment: HEP Plan: Patient agrees to discharge.  Patient goals were not met. Patient is being discharged due to being pleased with the current functional level.  ?????      Problem List Patient Active Problem List   Diagnosis Date Noted  . Cervical post-laminectomy syndrome 05/09/2016  . Cervical myofascial pain syndrome 05/09/2016  . DDD (degenerative disc disease), cervical 05/09/2016  . Cervical spondylosis without myelopathy 05/09/2016    Carney Living PT DPT  08/01/2016, 10:30 AM  [place], [place], [address] Phone: [phone]   Fax:  [phone]  Name: Adalea Handler MRN: [id number] Date of Birth: May 23, 1960

## 2016-08-02 ENCOUNTER — Other Ambulatory Visit: Payer: Self-pay | Admitting: Family Medicine

## 2016-08-02 DIAGNOSIS — Z1231 Encounter for screening mammogram for malignant neoplasm of breast: Secondary | ICD-10-CM

## 2016-08-17 DIAGNOSIS — H353131 Nonexudative age-related macular degeneration, bilateral, early dry stage: Secondary | ICD-10-CM | POA: Diagnosis not present

## 2016-08-17 DIAGNOSIS — H43811 Vitreous degeneration, right eye: Secondary | ICD-10-CM | POA: Diagnosis not present

## 2016-08-17 DIAGNOSIS — H43822 Vitreomacular adhesion, left eye: Secondary | ICD-10-CM | POA: Diagnosis not present

## 2016-08-17 DIAGNOSIS — H2513 Age-related nuclear cataract, bilateral: Secondary | ICD-10-CM | POA: Diagnosis not present

## 2016-08-22 ENCOUNTER — Ambulatory Visit
Admission: RE | Admit: 2016-08-22 | Discharge: 2016-08-22 | Disposition: A | Discharge status: Home / Self Care | Payer: Medicare HMO | Source: Ambulatory Visit | Attending: Family Medicine | Admitting: Family Medicine

## 2016-08-22 ENCOUNTER — Encounter: Payer: Self-pay | Admitting: Radiology

## 2016-08-22 DIAGNOSIS — Z1231 Encounter for screening mammogram for malignant neoplasm of breast: Secondary | ICD-10-CM

## 2016-11-10 DIAGNOSIS — G4733 Obstructive sleep apnea (adult) (pediatric): Secondary | ICD-10-CM | POA: Diagnosis not present

## 2016-12-23 DIAGNOSIS — G4700 Insomnia, unspecified: Secondary | ICD-10-CM | POA: Diagnosis not present

## 2016-12-23 DIAGNOSIS — M79671 Pain in right foot: Secondary | ICD-10-CM | POA: Diagnosis not present

## 2016-12-23 DIAGNOSIS — E785 Hyperlipidemia, unspecified: Secondary | ICD-10-CM | POA: Diagnosis not present

## 2016-12-23 DIAGNOSIS — G4733 Obstructive sleep apnea (adult) (pediatric): Secondary | ICD-10-CM | POA: Diagnosis not present

## 2016-12-23 DIAGNOSIS — F17210 Nicotine dependence, cigarettes, uncomplicated: Secondary | ICD-10-CM | POA: Diagnosis not present

## 2016-12-23 DIAGNOSIS — Z0000 Encounter for general adult medical examination without abnormal findings: Secondary | ICD-10-CM | POA: Diagnosis not present

## 2016-12-23 DIAGNOSIS — Z1389 Encounter for screening for other disorder: Secondary | ICD-10-CM | POA: Diagnosis not present

## 2016-12-23 DIAGNOSIS — Z780 Asymptomatic menopausal state: Secondary | ICD-10-CM | POA: Diagnosis not present

## 2016-12-23 DIAGNOSIS — I10 Essential (primary) hypertension: Secondary | ICD-10-CM | POA: Diagnosis not present

## 2016-12-28 ENCOUNTER — Telehealth: Payer: Self-pay | Admitting: Acute Care

## 2016-12-28 DIAGNOSIS — M7751 Other enthesopathy of right foot: Secondary | ICD-10-CM | POA: Diagnosis not present

## 2016-12-28 DIAGNOSIS — B070 Plantar wart: Secondary | ICD-10-CM | POA: Diagnosis not present

## 2016-12-28 DIAGNOSIS — M19071 Primary osteoarthritis, right ankle and foot: Secondary | ICD-10-CM | POA: Diagnosis not present

## 2016-12-28 DIAGNOSIS — M19072 Primary osteoarthritis, left ankle and foot: Secondary | ICD-10-CM | POA: Diagnosis not present

## 2016-12-28 DIAGNOSIS — F17210 Nicotine dependence, cigarettes, uncomplicated: Secondary | ICD-10-CM

## 2016-12-28 DIAGNOSIS — M659 Synovitis and tenosynovitis, unspecified: Secondary | ICD-10-CM | POA: Diagnosis not present

## 2016-12-28 DIAGNOSIS — G5761 Lesion of plantar nerve, right lower limb: Secondary | ICD-10-CM | POA: Diagnosis not present

## 2016-12-28 DIAGNOSIS — Z122 Encounter for screening for malignant neoplasm of respiratory organs: Secondary | ICD-10-CM

## 2016-12-28 DIAGNOSIS — G5762 Lesion of plantar nerve, left lower limb: Secondary | ICD-10-CM | POA: Diagnosis not present

## 2016-12-28 DIAGNOSIS — M7752 Other enthesopathy of left foot: Secondary | ICD-10-CM | POA: Diagnosis not present

## 2016-12-29 NOTE — Telephone Encounter (Signed)
Will route to the lung nodule pool 

## 2016-12-29 NOTE — Telephone Encounter (Signed)
Spoke with pt and scheduled SDMV 01/18/17 9:00 CT ordered Nothing further needed

## 2017-01-04 DIAGNOSIS — M8588 Other specified disorders of bone density and structure, other site: Secondary | ICD-10-CM | POA: Diagnosis not present

## 2017-01-04 DIAGNOSIS — E2839 Other primary ovarian failure: Secondary | ICD-10-CM | POA: Diagnosis not present

## 2017-01-05 DIAGNOSIS — M7752 Other enthesopathy of left foot: Secondary | ICD-10-CM | POA: Diagnosis not present

## 2017-01-05 DIAGNOSIS — M7751 Other enthesopathy of right foot: Secondary | ICD-10-CM | POA: Diagnosis not present

## 2017-01-05 DIAGNOSIS — G5762 Lesion of plantar nerve, left lower limb: Secondary | ICD-10-CM | POA: Diagnosis not present

## 2017-01-05 DIAGNOSIS — G5761 Lesion of plantar nerve, right lower limb: Secondary | ICD-10-CM | POA: Diagnosis not present

## 2017-01-05 DIAGNOSIS — M659 Synovitis and tenosynovitis, unspecified: Secondary | ICD-10-CM | POA: Diagnosis not present

## 2017-01-05 DIAGNOSIS — M2570 Osteophyte, unspecified joint: Secondary | ICD-10-CM | POA: Diagnosis not present

## 2017-01-18 ENCOUNTER — Ambulatory Visit (INDEPENDENT_AMBULATORY_CARE_PROVIDER_SITE_OTHER): Payer: Medicare HMO | Admitting: Acute Care

## 2017-01-18 ENCOUNTER — Encounter: Payer: Self-pay | Admitting: Acute Care

## 2017-01-18 ENCOUNTER — Ambulatory Visit (INDEPENDENT_AMBULATORY_CARE_PROVIDER_SITE_OTHER)
Admission: RE | Admit: 2017-01-18 | Discharge: 2017-01-18 | Disposition: A | Discharge status: Home / Self Care | Payer: Medicare HMO | Source: Ambulatory Visit | Attending: Acute Care | Admitting: Acute Care

## 2017-01-18 DIAGNOSIS — Z87891 Personal history of nicotine dependence: Secondary | ICD-10-CM | POA: Diagnosis not present

## 2017-01-18 DIAGNOSIS — F17210 Nicotine dependence, cigarettes, uncomplicated: Secondary | ICD-10-CM

## 2017-01-18 DIAGNOSIS — Z122 Encounter for screening for malignant neoplasm of respiratory organs: Secondary | ICD-10-CM

## 2017-01-18 NOTE — Progress Notes (Signed)
Shared Decision Making Visit Lung Cancer Screening Program [phone])   Eligibility:  Age 56 y.o.  Pack Years Smoking History Calculation : 35 pack year smoking history (# packs/per year x # years smoked)  Recent History of coughing up blood  no  Unexplained weight loss? no ( >Than 15 pounds within the last 6 months )  Prior History Lung / other cancer no (Diagnosis within the last 5 years already requiring surveillance chest CT Scans).  Smoking Status Current Smoker  Former Smokers: Years since quit: NA  Quit Date: NA  Visit Components:  Discussion included one or more decision making aids. yes  Discussion included risk/benefits of screening. yes  Discussion included potential follow up diagnostic testing for abnormal scans. yes  Discussion included meaning and risk of over diagnosis. yes  Discussion included meaning and risk of False Positives. yes  Discussion included meaning of total radiation exposure. yes  Counseling Included:  Importance of adherence to annual lung cancer LDCT screening. yes  Impact of comorbidities on ability to participate in the program. yes  Ability and willingness to under diagnostic treatment. yes  Smoking Cessation Counseling:  Current Smokers:   Discussed importance of smoking cessation. yes  Information about tobacco cessation classes and interventions provided to patient. yes  Patient provided with "ticket" for LDCT Scan. yes  Symptomatic Patient. no  Counseling  Diagnosis Code: Tobacco Use Z72.0  Asymptomatic Patient yes  Counseling (Intermediate counseling: > three minutes counseling) O9735  Former Smokers:   Discussed the importance of maintaining cigarette abstinence. yes  Diagnosis Code: Personal History of Nicotine Dependence. H29.924  Information about tobacco cessation classes and interventions provided to patient. Yes  Patient provided with "ticket" for LDCT Scan. yes  Written Order for Lung Cancer  Screening with LDCT placed in Epic. Yes (CT Chest Lung Cancer Screening Low Dose W/O CM) QAS3419 Z12.2-Screening of respiratory organs Z87.891-Personal history of nicotine dependence  I have spent 25 minutes of face to face time with Amanda Cordova discussing the risks and benefits of lung cancer screening. We viewed a power point together that explained in detail the above noted topics. We paused at intervals to allow for questions to be asked and answered to ensure understanding.We discussed that the single most powerful action that she can take to decrease her risk of developing lung cancer is to quit smoking. We discussed whether or not she is ready to commit to setting a quit date. She is currently not ready to set a quit date. We discussed options for tools to aid in quitting smoking including nicotine replacement therapy, non-nicotine medications, support groups, Quit Smart classes, and behavior modification. We discussed that often times setting smaller, more achievable goals, such as eliminating 1 cigarette a day for a week and then 2 cigarettes a day for a week can be helpful in slowly decreasing the number of cigarettes smoked. This allows for a sense of accomplishment as well as providing a clinical benefit. I gave her  the " Be Stronger Than Your Excuses" card with contact information for community resources, classes, free nicotine replacement therapy, and access to mobile apps, text messaging, and on-line smoking cessation help. I have also given   her my card and contact information in the event she  needs to contact me. We discussed the time and location of the scan, and that either Doroteo Glassman RN or I will call with the results within 24-48 hours of receiving them. I have offered her  a copy of the  power point we viewed  as a resource in the event they need reinforcement of the concepts we discussed today in the office. The patient verbalized understanding of all of  the above and had  no further questions upon leaving the office. They have my contact information in the event they have any further questions.  I spent 4 minutes counseling on smoking cessation and the health risks of continued tobacco abuse.  I explained to the patient that there has been a high incidence of coronary artery disease noted on these exams. I explained that this is a non-gated exam therefore degree or severity cannot be determined. This patient is not on statin therapy. I have asked the patient to follow-up with their PCP regarding any incidental finding of coronary artery disease and management with diet or medication as their PCP  feels is clinically indicated. The patient verbalized understanding of the above and had no further questions upon completion of the visit.      Magdalen Spatz, NP 01/18/2017

## 2017-01-18 NOTE — Addendum Note (Signed)
Addended by: Eric Form F on: 01/18/2017 10:27 AM   Modules accepted: Level of Service

## 2017-01-23 ENCOUNTER — Other Ambulatory Visit: Payer: Self-pay | Admitting: Acute Care

## 2017-01-23 DIAGNOSIS — F17210 Nicotine dependence, cigarettes, uncomplicated: Secondary | ICD-10-CM

## 2017-01-23 DIAGNOSIS — Z122 Encounter for screening for malignant neoplasm of respiratory organs: Secondary | ICD-10-CM

## 2017-02-16 DIAGNOSIS — G4733 Obstructive sleep apnea (adult) (pediatric): Secondary | ICD-10-CM | POA: Diagnosis not present

## 2017-05-30 DIAGNOSIS — G4733 Obstructive sleep apnea (adult) (pediatric): Secondary | ICD-10-CM | POA: Diagnosis not present

## 2017-06-23 DIAGNOSIS — I10 Essential (primary) hypertension: Secondary | ICD-10-CM | POA: Diagnosis not present

## 2017-06-23 DIAGNOSIS — Z6841 Body Mass Index (BMI) 40.0 and over, adult: Secondary | ICD-10-CM | POA: Diagnosis not present

## 2017-06-23 DIAGNOSIS — R2689 Other abnormalities of gait and mobility: Secondary | ICD-10-CM | POA: Diagnosis not present

## 2017-07-17 DIAGNOSIS — D333 Benign neoplasm of cranial nerves: Secondary | ICD-10-CM | POA: Diagnosis not present

## 2017-07-17 DIAGNOSIS — Z09 Encounter for follow-up examination after completed treatment for conditions other than malignant neoplasm: Secondary | ICD-10-CM | POA: Diagnosis not present

## 2017-07-17 DIAGNOSIS — Z86018 Personal history of other benign neoplasm: Secondary | ICD-10-CM | POA: Diagnosis not present

## 2017-07-17 DIAGNOSIS — Z86011 Personal history of benign neoplasm of the brain: Secondary | ICD-10-CM | POA: Diagnosis not present

## 2017-07-17 DIAGNOSIS — Z9889 Other specified postprocedural states: Secondary | ICD-10-CM | POA: Diagnosis not present

## 2017-08-18 ENCOUNTER — Ambulatory Visit: Payer: Medicare HMO | Admitting: Nurse Practitioner

## 2017-09-04 DIAGNOSIS — G4733 Obstructive sleep apnea (adult) (pediatric): Secondary | ICD-10-CM | POA: Diagnosis not present

## 2017-09-07 ENCOUNTER — Ambulatory Visit: Payer: Medicare HMO | Admitting: Nurse Practitioner

## 2017-10-17 DIAGNOSIS — G5763 Lesion of plantar nerve, bilateral lower limbs: Secondary | ICD-10-CM | POA: Diagnosis not present

## 2017-10-17 DIAGNOSIS — L0292 Furuncle, unspecified: Secondary | ICD-10-CM | POA: Diagnosis not present

## 2017-11-06 DIAGNOSIS — G5763 Lesion of plantar nerve, bilateral lower limbs: Secondary | ICD-10-CM | POA: Diagnosis not present

## 2017-11-06 DIAGNOSIS — B070 Plantar wart: Secondary | ICD-10-CM | POA: Diagnosis not present

## 2017-11-06 DIAGNOSIS — M659 Synovitis and tenosynovitis, unspecified: Secondary | ICD-10-CM | POA: Diagnosis not present

## 2017-11-06 DIAGNOSIS — L600 Ingrowing nail: Secondary | ICD-10-CM | POA: Diagnosis not present

## 2017-11-13 DIAGNOSIS — G5763 Lesion of plantar nerve, bilateral lower limbs: Secondary | ICD-10-CM | POA: Diagnosis not present

## 2017-11-13 DIAGNOSIS — M659 Synovitis and tenosynovitis, unspecified: Secondary | ICD-10-CM | POA: Diagnosis not present

## 2017-11-29 DIAGNOSIS — B070 Plantar wart: Secondary | ICD-10-CM | POA: Diagnosis not present

## 2017-11-29 DIAGNOSIS — M76821 Posterior tibial tendinitis, right leg: Secondary | ICD-10-CM | POA: Diagnosis not present

## 2017-11-29 DIAGNOSIS — M76822 Posterior tibial tendinitis, left leg: Secondary | ICD-10-CM | POA: Diagnosis not present

## 2017-11-29 DIAGNOSIS — M7752 Other enthesopathy of left foot: Secondary | ICD-10-CM | POA: Diagnosis not present

## 2017-11-29 DIAGNOSIS — M7751 Other enthesopathy of right foot: Secondary | ICD-10-CM | POA: Diagnosis not present

## 2017-12-08 DIAGNOSIS — G4733 Obstructive sleep apnea (adult) (pediatric): Secondary | ICD-10-CM | POA: Diagnosis not present

## 2017-12-11 DIAGNOSIS — H2513 Age-related nuclear cataract, bilateral: Secondary | ICD-10-CM | POA: Diagnosis not present

## 2017-12-11 DIAGNOSIS — H353131 Nonexudative age-related macular degeneration, bilateral, early dry stage: Secondary | ICD-10-CM | POA: Diagnosis not present

## 2017-12-11 DIAGNOSIS — L718 Other rosacea: Secondary | ICD-10-CM | POA: Diagnosis not present

## 2017-12-12 DIAGNOSIS — B070 Plantar wart: Secondary | ICD-10-CM | POA: Diagnosis not present

## 2017-12-29 DIAGNOSIS — H5213 Myopia, bilateral: Secondary | ICD-10-CM | POA: Diagnosis not present

## 2017-12-29 DIAGNOSIS — H52209 Unspecified astigmatism, unspecified eye: Secondary | ICD-10-CM | POA: Diagnosis not present

## 2017-12-29 DIAGNOSIS — H524 Presbyopia: Secondary | ICD-10-CM | POA: Diagnosis not present

## 2018-01-09 ENCOUNTER — Emergency Department (HOSPITAL_COMMUNITY): Payer: Medicare HMO

## 2018-01-09 ENCOUNTER — Other Ambulatory Visit: Payer: Self-pay

## 2018-01-09 ENCOUNTER — Inpatient Hospital Stay (HOSPITAL_COMMUNITY)
Admission: EM | Admit: 2018-01-09 | Discharge: 2018-01-11 | DRG: 123 | Disposition: A | Discharge status: Home Health | Payer: Medicare HMO | Attending: Family Medicine | Admitting: Family Medicine

## 2018-01-09 ENCOUNTER — Encounter (HOSPITAL_COMMUNITY): Payer: Self-pay | Admitting: Emergency Medicine

## 2018-01-09 DIAGNOSIS — Z86018 Personal history of other benign neoplasm: Secondary | ICD-10-CM | POA: Diagnosis not present

## 2018-01-09 DIAGNOSIS — R402363 Coma scale, best motor response, obeys commands, at hospital admission: Secondary | ICD-10-CM | POA: Diagnosis present

## 2018-01-09 DIAGNOSIS — H469 Unspecified optic neuritis: Principal | ICD-10-CM | POA: Diagnosis present

## 2018-01-09 DIAGNOSIS — H9190 Unspecified hearing loss, unspecified ear: Secondary | ICD-10-CM | POA: Diagnosis present

## 2018-01-09 DIAGNOSIS — D751 Secondary polycythemia: Secondary | ICD-10-CM | POA: Diagnosis present

## 2018-01-09 DIAGNOSIS — F17210 Nicotine dependence, cigarettes, uncomplicated: Secondary | ICD-10-CM | POA: Diagnosis not present

## 2018-01-09 DIAGNOSIS — H538 Other visual disturbances: Secondary | ICD-10-CM | POA: Diagnosis not present

## 2018-01-09 DIAGNOSIS — Z888 Allergy status to other drugs, medicaments and biological substances status: Secondary | ICD-10-CM | POA: Diagnosis not present

## 2018-01-09 DIAGNOSIS — H5462 Unqualified visual loss, left eye, normal vision right eye: Secondary | ICD-10-CM | POA: Diagnosis present

## 2018-01-09 DIAGNOSIS — F419 Anxiety disorder, unspecified: Secondary | ICD-10-CM | POA: Diagnosis not present

## 2018-01-09 DIAGNOSIS — D333 Benign neoplasm of cranial nerves: Secondary | ICD-10-CM | POA: Diagnosis not present

## 2018-01-09 DIAGNOSIS — I10 Essential (primary) hypertension: Secondary | ICD-10-CM | POA: Diagnosis present

## 2018-01-09 DIAGNOSIS — Z981 Arthrodesis status: Secondary | ICD-10-CM | POA: Diagnosis not present

## 2018-01-09 DIAGNOSIS — R402253 Coma scale, best verbal response, oriented, at hospital admission: Secondary | ICD-10-CM | POA: Diagnosis not present

## 2018-01-09 DIAGNOSIS — R402143 Coma scale, eyes open, spontaneous, at hospital admission: Secondary | ICD-10-CM | POA: Diagnosis present

## 2018-01-09 DIAGNOSIS — Z79899 Other long term (current) drug therapy: Secondary | ICD-10-CM

## 2018-01-09 DIAGNOSIS — Z885 Allergy status to narcotic agent status: Secondary | ICD-10-CM

## 2018-01-09 DIAGNOSIS — H539 Unspecified visual disturbance: Secondary | ICD-10-CM

## 2018-01-09 HISTORY — DX: Benign neoplasm of cranial nerves: D33.3

## 2018-01-09 LAB — CBC
HCT: 46.7 % — ABNORMAL HIGH (ref 36.0–46.0)
Hemoglobin: 15.1 g/dL — ABNORMAL HIGH (ref 12.0–15.0)
MCH: 30.9 pg (ref 26.0–34.0)
MCHC: 32.3 g/dL (ref 30.0–36.0)
MCV: 95.5 fL (ref 78.0–100.0)
Platelets: 285 10*3/uL (ref 150–400)
RBC: 4.89 MIL/uL (ref 3.87–5.11)
RDW: 13.6 % (ref 11.5–15.5)
WBC: 11 10*3/uL — ABNORMAL HIGH (ref 4.0–10.5)

## 2018-01-09 LAB — I-STAT CHEM 8, ED
BUN: 12 mg/dL (ref 6–20)
Calcium, Ion: 1.18 mmol/L (ref 1.15–1.40)
Chloride: 107 mmol/L (ref 98–111)
Creatinine, Ser: 0.7 mg/dL (ref 0.44–1.00)
Glucose, Bld: 98 mg/dL (ref 70–99)
HCT: 47 % — ABNORMAL HIGH (ref 36.0–46.0)
Hemoglobin: 16 g/dL — ABNORMAL HIGH (ref 12.0–15.0)
Potassium: 3.9 mmol/L (ref 3.5–5.1)
Sodium: 145 mmol/L (ref 135–145)
TCO2: 27 mmol/L (ref 22–32)

## 2018-01-09 LAB — BASIC METABOLIC PANEL
Anion gap: 9 (ref 5–15)
BUN: 11 mg/dL (ref 6–20)
CO2: 27 mmol/L (ref 22–32)
Calcium: 9.5 mg/dL (ref 8.9–10.3)
Chloride: 108 mmol/L (ref 98–111)
Creatinine, Ser: 0.71 mg/dL (ref 0.44–1.00)
GFR calc Af Amer: >60 mL/min (ref >60)
GFR calc non Af Amer: >60 mL/min (ref >60)
Glucose, Bld: 101 mg/dL — ABNORMAL HIGH (ref 70–99)
Potassium: 3.9 mmol/L (ref 3.5–5.1)
Sodium: 144 mmol/L (ref 135–145)

## 2018-01-09 MED ORDER — SODIUM CHLORIDE 0.9 % IV SOLN
1000.0000 mg | Freq: Once | INTRAVENOUS | Status: AC
  Administered 2018-01-10: 1000 mg via INTRAVENOUS
  Filled 2018-01-09: qty 8

## 2018-01-09 MED ORDER — GADOBUTROL 1 MMOL/ML IV SOLN
10.0000 mL | Freq: Once | INTRAVENOUS | Status: AC | PRN
  Administered 2018-01-09: 10 mL via INTRAVENOUS

## 2018-01-09 NOTE — ED Notes (Addendum)
Pt remains in waiting room. Updated on wait for treatment room.  Pt wanting to know how much longer for MRI. Called MRI and they are on the way to get pt now.

## 2018-01-09 NOTE — ED Notes (Signed)
Pt to MRI

## 2018-01-09 NOTE — ED Provider Notes (Signed)
Patient placed in Quick Look pathway, seen and evaluated   Chief Complaint: Eye pain and vision loss (left eye)  HPI:  Presents per recommendation of ophthalmologist for progressive left eye blurriness and pain for the past month. Also has left sided headache.   Per ophthalmologist note, concern for optic neuritis given acute vision loss, pain with eye movements and new APD. Recommends patient seen in ED for mri bran and orbits with and without contrast to rule out inflammatory, compressive or infiltrative lesion   ROS: + left sided headache  Physical Exam:   Gen: No distress  Neuro: Awake and Alert  Skin: Warm    Focused Exam: Left pupil does not constrict as much as right with light.    Initiation of care has begun. The patient has been counseled on the process, plan, and necessity for staying for the completion/evaluation, and the remainder of the medical screening examination    Bernarda Caffey 01/09/18 [place], [place], DO 01/09/18 2231

## 2018-01-09 NOTE — ED Triage Notes (Signed)
Pt sent by eye doctor for MRI. C/o left eye pain with movement and worsening blurred vision, and constant headache. Hx acoustic neuroma.

## 2018-01-09 NOTE — ED Provider Notes (Signed)
[facility] EMERGENCY DEPARTMENT Provider Note   CSN: [id number] Arrival date & time: 01/09/18  1653     History   Chief Complaint Chief Complaint  Patient presents with  . Eye Pain    HPI Amanda Cordova is a 57 y.o. female.  Patient sent by her ophthalmologist Dr. Alanda Slim with a 1 month history of left eye blurriness, tearing and pain as well as headache.  Reports she is had blurry vision in both eyes left more than the right.  Associated with intermittent headache.  Sent with concern for optic neuritis with recommendation for MRI.  She denies any fevers, chills, nausea or vomiting.  No abdominal pain.  No chest pain or shortness of breath.  No focal weakness or tingling.  No bowel or bladder incontinence.  She reports her eyes are blurry with occasional flashes of light in her left eye.  The history is provided by the patient.  Eye Pain  Pertinent negatives include no chest pain, no abdominal pain, no headaches and no shortness of breath.    Past Medical History:  Diagnosis Date  . Acoustic neuroma ([place])   . Hypertension     Patient Active Problem List   Diagnosis Date Noted  . Cervical post-laminectomy syndrome 05/09/2016  . Cervical myofascial pain syndrome 05/09/2016  . DDD (degenerative disc disease), cervical 05/09/2016  . Cervical spondylosis without myelopathy 05/09/2016    Past Surgical History:  Procedure Laterality Date  . Brain Neuroma  2011  . BREAST EXCISIONAL BIOPSY Right 2000   benign  . cerical fusion  1995     OB History   None      Home Medications    Prior to Admission medications   Medication Sig Start Date End Date Taking? Authorizing Provider  ALPRAZolam Duanne Moron) 0.25 MG tablet Take 0.25 mg by mouth daily as needed for anxiety or sleep.  08/12/15   [provider]  clotrimazole-betamethasone (LOTRISONE) cream Apply 1 application topically daily as needed. 07/21/15   [provider]  lisinopril  (PRINIVIL,ZESTRIL) 10 MG tablet Take 10 mg by mouth daily. 09/11/15   [provider]  Multiple Vitamins-Minerals (CENTRUM SILVER PO) Take 1 tablet by mouth daily.    [provider]  traMADol (ULTRAM) 50 MG tablet Take 2 tablets (100 mg total) by mouth 3 (three) times daily. Patient not taking: Reported on 07/05/2016 05/09/16   Charlett Blake, MD    Family History Family History  Problem Relation Age of Onset  . Breast cancer Sister     Social History Social History   Tobacco Use  . Smoking status: Current Every Day Smoker    Packs/day: 1.00    Years: 35.00    Pack years: 35.00    Types: Cigarettes  . Smokeless tobacco: Never Used  Substance Use Topics  . Alcohol use: Not on file  . Drug use: Not on file     Allergies   Gabapentin   Review of Systems Review of Systems  Constitutional: Negative for activity change, appetite change and fever.  HENT: Negative for congestion.   Eyes: Positive for photophobia, pain and visual disturbance.  Respiratory: Negative for cough, chest tightness and shortness of breath.   Cardiovascular: Negative for chest pain.  Gastrointestinal: Negative for abdominal pain, nausea and vomiting.  Endocrine: Negative for polyuria.  Genitourinary: Negative for dysuria, frequency and urgency.  Neurological: Negative for dizziness, weakness and headaches.    all other systems are negative except as noted in  the HPI and PMH.   Physical Exam Updated Vital Signs BP (!) 144/111 (BP Location: Right Arm)   Pulse 73   Temp 98.6 F (37 C) (Oral)   Resp 16   SpO2 98%   Physical Exam  Constitutional: She is oriented to person, place, and time. She appears well-developed and well-nourished. No distress.  HENT:  Head: Normocephalic and atraumatic.  Mouth/Throat: Oropharynx is clear and moist. No oropharyngeal exudate.  Eyes: Pupils are equal, round, and reactive to light. Conjunctivae and EOM are normal.  Visual fields full to  confrontation bilaterally.  APD present on the left.  Neck: Normal range of motion. Neck supple.  No meningismus.  Cardiovascular: Normal rate, regular rhythm, normal heart sounds and intact distal pulses.  No murmur heard. Pulmonary/Chest: Effort normal and breath sounds normal. No respiratory distress.  Abdominal: Soft. There is no tenderness. There is no rebound and no guarding.  Musculoskeletal: Normal range of motion. She exhibits no edema or tenderness.  Neurological: She is alert and oriented to person, place, and time. No cranial nerve deficit. She exhibits normal muscle tone. Coordination normal.   5/5 strength throughout. CN 2-12 intact.Equal grip strength.   Skin: Skin is warm.  Psychiatric: She has a normal mood and affect. Her behavior is normal.  Nursing note and vitals reviewed.    ED Treatments / Results  Labs (all labs ordered are listed, but only abnormal results are displayed) Labs Reviewed  CBC - Abnormal; Notable for the following components:      Result Value   WBC 11.0 (*)    Hemoglobin 15.1 (*)    HCT 46.7 (*)    All other components within normal limits  BASIC METABOLIC PANEL - Abnormal; Notable for the following components:   Glucose, Bld 101 (*)    All other components within normal limits  I-STAT CHEM 8, ED - Abnormal; Notable for the following components:   Hemoglobin 16.0 (*)    HCT 47.0 (*)    All other components within normal limits    EKG None  Radiology Mr Jeri Cos And Wo Contrast  Result Date: 01/09/2018 CLINICAL DATA:  Progressive LEFT blurriness and eye pain for months. LEFT-sided headache. History of hypertension and LEFT acoustic neuroma. EXAM: MRI HEAD AND ORBITS WITHOUT AND WITH CONTRAST TECHNIQUE: Multiplanar, multiecho pulse sequences of the brain and surrounding structures were obtained without and with intravenous contrast. Multiplanar, multiecho pulse sequences of the orbits and surrounding structures were obtained including fat  saturation techniques, before and after intravenous contrast administration. CONTRAST:  10 cc Gadavist COMPARISON:  RIGHT pelvic radiograph January 09, 2018 at 0212 hours. FINDINGS: MRI HEAD FINDINGS INTRACRANIAL CONTENTS: No reduced diffusion to suggest acute ischemia. No susceptibility artifact to suggest hemorrhage. Small area LEFT cerebellar insist postoperative encephalomalacia. Scattered supratentorial subcentimeter white matter FLAIR T2 hyperintensities in a nonspecific distribution. The ventricles and sulci are normal for patient's age. No suspicious parenchymal signal, masses, mass effect. No abnormal intraparenchymal or extra-axial enhancement. No abnormal extra-axial fluid collections. No extra-axial masses. VASCULAR: Normal major intracranial vascular flow voids present at skull base. SKULL AND UPPER CERVICAL SPINE: No abnormal sellar expansion. Status post LEFT retrosigmoid craniotomy. No suspicious calvarial bone marrow signal. Craniocervical junction maintained. OTHER: None. MRI ORBITS FINDINGS-moderately motion degraded examination. ORBITS: Ocular globes are intact with normal signal. Lenses are located. Preservation of the orbital fat. Normal appearance of the optic nerve sheath complexes without abnormal optic nerve. Faint LEFT optic nerve sheath enhancement versus motion artifact. Normal  symmetric appearance of the extraocular muscles. No intra-ocular mass, signal abnormality nor abnormal enhancement. Superior ophthalmic veins are not enlarged. VISUALIZED SINUSES: Well-aerated. SOFT TISSUES: Normal. IMPRESSION: MRI HEAD: 1. No acute intracranial process. 2. Status post LEFT retrosigmoid craniotomy, no residual or recurrent tumor though not tailored for evaluation. Postoperative LEFT cerebellar encephalomalacia. 3. Mild-to-moderate chronic small vessel ischemic changes, atypical distribution for classic demyelination. MRA HEAD: 1. Moderately motion degraded examination. 2. Faint LEFT optic  perineuritis versus motion artifact. Electronically Signed   By: Elon Alas M.D.   On: 01/09/2018 22:01   Mr Rosealee Albee OV Contrast  Result Date: 01/09/2018 CLINICAL DATA:  Progressive LEFT blurriness and eye pain for months. LEFT-sided headache. History of hypertension and LEFT acoustic neuroma. EXAM: MRI HEAD AND ORBITS WITHOUT AND WITH CONTRAST TECHNIQUE: Multiplanar, multiecho pulse sequences of the brain and surrounding structures were obtained without and with intravenous contrast. Multiplanar, multiecho pulse sequences of the orbits and surrounding structures were obtained including fat saturation techniques, before and after intravenous contrast administration. CONTRAST:  10 cc Gadavist COMPARISON:  RIGHT pelvic radiograph January 09, 2018 at 0212 hours. FINDINGS: MRI HEAD FINDINGS INTRACRANIAL CONTENTS: No reduced diffusion to suggest acute ischemia. No susceptibility artifact to suggest hemorrhage. Small area LEFT cerebellar insist postoperative encephalomalacia. Scattered supratentorial subcentimeter white matter FLAIR T2 hyperintensities in a nonspecific distribution. The ventricles and sulci are normal for patient's age. No suspicious parenchymal signal, masses, mass effect. No abnormal intraparenchymal or extra-axial enhancement. No abnormal extra-axial fluid collections. No extra-axial masses. VASCULAR: Normal major intracranial vascular flow voids present at skull base. SKULL AND UPPER CERVICAL SPINE: No abnormal sellar expansion. Status post LEFT retrosigmoid craniotomy. No suspicious calvarial bone marrow signal. Craniocervical junction maintained. OTHER: None. MRI ORBITS FINDINGS-moderately motion degraded examination. ORBITS: Ocular globes are intact with normal signal. Lenses are located. Preservation of the orbital fat. Normal appearance of the optic nerve sheath complexes without abnormal optic nerve. Faint LEFT optic nerve sheath enhancement versus motion artifact. Normal symmetric  appearance of the extraocular muscles. No intra-ocular mass, signal abnormality nor abnormal enhancement. Superior ophthalmic veins are not enlarged. VISUALIZED SINUSES: Well-aerated. SOFT TISSUES: Normal. IMPRESSION: MRI HEAD: 1. No acute intracranial process. 2. Status post LEFT retrosigmoid craniotomy, no residual or recurrent tumor though not tailored for evaluation. Postoperative LEFT cerebellar encephalomalacia. 3. Mild-to-moderate chronic small vessel ischemic changes, atypical distribution for classic demyelination. MRA HEAD: 1. Moderately motion degraded examination. 2. Faint LEFT optic perineuritis versus motion artifact. Electronically Signed   By: Elon Alas M.D.   On: 01/09/2018 22:01    Procedures Procedures (including critical care time)  Medications Ordered in ED Medications  gadobutrol (GADAVIST) 1 MMOL/ML injection 10 mL (10 mLs Intravenous Contrast Given 01/09/18 2124)     Initial Impression / Assessment and Plan / ED Course  I have reviewed the triage vital signs and the nursing notes.  Pertinent labs & imaging results that were available during my care of the patient were reviewed by me and considered in my medical decision making (see chart for details).    Patient sent by ophthalmologist with 1 month of acute change in left eye vision.  Has new APD on exam.  MRI ordered in triage.  MRI motion degraded but shows L optic perineuritis. ESR normal. Doubt temporal arteritis.   This was discussed with Dr. Malen Gauze of neurology.  He agrees this is possible left optic neuritis and could be atypical demyelination.  Does recommend admission for IV steroids.  Patient agreeable.  Discussed with  Dr. Myna Hidalgo.  Final Clinical Impressions(s) / ED Diagnoses   Final diagnoses:  Optic neuritis    ED Discharge Orders    None       Barbarajean Kinzler, Annie Main, MD 01/10/18 [phone]

## 2018-01-10 ENCOUNTER — Encounter (HOSPITAL_COMMUNITY): Payer: Self-pay | Admitting: Family Medicine

## 2018-01-10 ENCOUNTER — Inpatient Hospital Stay (HOSPITAL_COMMUNITY): Payer: Medicare HMO

## 2018-01-10 ENCOUNTER — Other Ambulatory Visit: Payer: Self-pay

## 2018-01-10 DIAGNOSIS — I10 Essential (primary) hypertension: Secondary | ICD-10-CM

## 2018-01-10 DIAGNOSIS — H469 Unspecified optic neuritis: Principal | ICD-10-CM

## 2018-01-10 DIAGNOSIS — F419 Anxiety disorder, unspecified: Secondary | ICD-10-CM | POA: Diagnosis present

## 2018-01-10 LAB — GLUCOSE, CAPILLARY: Glucose-Capillary: 175 mg/dL — ABNORMAL HIGH (ref 70–99)

## 2018-01-10 LAB — C-REACTIVE PROTEIN: CRP: <0.8 mg/dL (ref <1.0)

## 2018-01-10 LAB — BASIC METABOLIC PANEL
Anion gap: 12 (ref 5–15)
BUN: 11 mg/dL (ref 6–20)
CO2: 21 mmol/L — ABNORMAL LOW (ref 22–32)
Calcium: 9 mg/dL (ref 8.9–10.3)
Chloride: 106 mmol/L (ref 98–111)
Creatinine, Ser: 0.63 mg/dL (ref 0.44–1.00)
GFR calc Af Amer: >60 mL/min (ref >60)
GFR calc non Af Amer: >60 mL/min (ref >60)
Glucose, Bld: 134 mg/dL — ABNORMAL HIGH (ref 70–99)
Potassium: 3.8 mmol/L (ref 3.5–5.1)
Sodium: 139 mmol/L (ref 135–145)

## 2018-01-10 LAB — HIV ANTIBODY (ROUTINE TESTING W REFLEX): HIV Screen 4th Generation wRfx: NONREACTIVE

## 2018-01-10 LAB — SEDIMENTATION RATE: Sed Rate: 18 mm/hr (ref 0–22)

## 2018-01-10 MED ORDER — ACETAMINOPHEN 650 MG RE SUPP: 650.0000 mg | Freq: Four times a day (QID) | RECTAL | Status: DC | PRN

## 2018-01-10 MED ORDER — NAPROXEN 250 MG PO TABS
500.0000 mg | ORAL_TABLET | Freq: Two times a day (BID) | ORAL | Status: DC | PRN
  Administered 2018-01-10 – 2018-01-11 (×4): 500 mg via ORAL
  Filled 2018-01-10 (×4): qty 2

## 2018-01-10 MED ORDER — ACETAMINOPHEN 325 MG PO TABS
650.0000 mg | ORAL_TABLET | Freq: Four times a day (QID) | ORAL | Status: DC | PRN
  Administered 2018-01-10: 650 mg via ORAL
  Filled 2018-01-10: qty 2

## 2018-01-10 MED ORDER — LISINOPRIL 10 MG PO TABS
10.0000 mg | ORAL_TABLET | Freq: Every day | ORAL | Status: DC
  Administered 2018-01-10 – 2018-01-11 (×2): 10 mg via ORAL
  Filled 2018-01-10 (×2): qty 1

## 2018-01-10 MED ORDER — SODIUM CHLORIDE 0.9 % IV SOLN
1000.0000 mg | INTRAVENOUS | Status: DC
  Administered 2018-01-10: 1000 mg via INTRAVENOUS
  Filled 2018-01-10: qty 8

## 2018-01-10 MED ORDER — SODIUM CHLORIDE 0.9% FLUSH
3.0000 mL | Freq: Two times a day (BID) | INTRAVENOUS | Status: DC
  Administered 2018-01-10 – 2018-01-11 (×3): 3 mL via INTRAVENOUS

## 2018-01-10 MED ORDER — SODIUM CHLORIDE 0.9 % IV SOLN: 250.0000 mL | INTRAVENOUS | Status: DC | PRN

## 2018-01-10 MED ORDER — ONDANSETRON HCL 4 MG PO TABS: 4.0000 mg | ORAL_TABLET | Freq: Four times a day (QID) | ORAL | Status: DC | PRN

## 2018-01-10 MED ORDER — SODIUM CHLORIDE 0.9% FLUSH: 3.0000 mL | INTRAVENOUS | Status: DC | PRN

## 2018-01-10 MED ORDER — ENOXAPARIN SODIUM 40 MG/0.4ML ~~LOC~~ SOLN
40.0000 mg | SUBCUTANEOUS | Status: DC
  Administered 2018-01-11: 40 mg via SUBCUTANEOUS
  Filled 2018-01-10 (×3): qty 0.4

## 2018-01-10 MED ORDER — PANTOPRAZOLE SODIUM 40 MG PO TBEC
40.0000 mg | DELAYED_RELEASE_TABLET | Freq: Every day | ORAL | Status: DC
  Administered 2018-01-10 – 2018-01-11 (×2): 40 mg via ORAL
  Filled 2018-01-10 (×2): qty 1

## 2018-01-10 MED ORDER — SENNOSIDES-DOCUSATE SODIUM 8.6-50 MG PO TABS: 1.0000 | ORAL_TABLET | Freq: Every evening | ORAL | Status: DC | PRN

## 2018-01-10 MED ORDER — ONDANSETRON HCL 4 MG/2ML IJ SOLN: 4.0000 mg | Freq: Four times a day (QID) | INTRAMUSCULAR | Status: DC | PRN

## 2018-01-10 MED ORDER — ALPRAZOLAM 0.25 MG PO TABS: 0.2500 mg | ORAL_TABLET | Freq: Every day | ORAL | Status: DC | PRN

## 2018-01-10 NOTE — Progress Notes (Signed)
Noted Neuro MD's plan for IV steroids for 5 days with possible home infusions.  Met with pt and family to discuss this; pt very agreeable to completing IV steroids at home, and family able to assist.  Will need orders for [facility] for home IV infusions with face to face documentation and Rx for remaining IV steroid doses.  Referral to Carolynn Sayers, IV Infusion Coordinator with [facility] to follow up with coordination of IV meds and teaching.    Reinaldo Raddle, RN, BSN  Trauma/Neuro ICU Case Manager [phone]

## 2018-01-10 NOTE — Progress Notes (Signed)
[facility] will support home infusion pharmacy services and home health services for pt at DC to support final ordered doses of Solumedrol at home.  If patient discharges after hours, please call [phone].   Larry Sierras 01/10/2018, 5:47 PM

## 2018-01-10 NOTE — Consult Note (Signed)
Neurology Consultation  Reason for Consult: Optic neuritis Referring Physician: Dr. Wyvonnia Dusky  CC: Left eye pain and blurred vision, headache  History is obtained from: Patient, chart  HPI: Amanda Cordova is a 57 y.o. female was a past medical history of hypertension, left-sided acoustic neuroma status post resection and hearing loss at baseline, who has been having headaches primarily on the left parietal side for the past month or so and started having some blurred vision in her left eye and that started nearly 3 to 4 weeks ago.  She saw her ophthalmologist for this, and was given a new prescription for her glasses.  She went back to the ophthalmologist today with tearing pain in the left eye on movement and increasingly blurred vision out of the left eye. Ophthalmologically evaluation at the outpatient clinic was concerning for a new left relative afferent pupillary defect, for which she was sent for an MRI of the brain and the orbits to be done in the emergency room emergently. She had an MRI of the brain and orbits done emergently that revealed possible faint left optic nerve enhancement. Neurological consultation was placed for recommendations of the treatment. MRI brain also revealed mild to moderate chronic small vessel ischemic changes and an atypical distribution for demyelination. She denies any preceding flulike illnesses, fevers or chills.  Denies any nausea vomiting shortness of breath chest pain. Denies any prior episodes of loss of vision.  Denies any worsening of symptoms in the heat or tingling down the spine with flexion of the neck.  She also reports that in the past she had been diagnosed with rainouts phenomenon many years ago but has not been having much in terms of symptoms related to that. No jaw claudication.  N no temporal tenderness  ROS: ROS was performed and is negative except as noted in the HPI   Past Medical History:  Diagnosis Date  . Acoustic neuroma ([place])     . Hypertension     Family History  Problem Relation Age of Onset  . Breast cancer Sister    Social History:   reports that she has been smoking cigarettes. She has a 35.00 pack-year smoking history. She has never used smokeless tobacco. Her alcohol and drug histories are not on file.  Medications  Current Facility-Administered Medications:  .  methylPREDNISolone sodium succinate (SOLU-MEDROL) 1,000 mg in sodium chloride 0.9 % 50 mL IVPB, 1,000 mg, Intravenous, Once, Rancour, Stephen, MD, Last Rate: 58 mL/hr at 01/10/18 0032, 1,000 mg at 01/10/18 0032  Current Outpatient Medications:  .  ALPRAZolam (XANAX) 0.25 MG tablet, Take 0.25 mg by mouth daily as needed for anxiety or sleep. , Disp: , Rfl:  .  lisinopril (PRINIVIL,ZESTRIL) 10 MG tablet, Take 10 mg by mouth daily., Disp: , Rfl:  .  Multiple Vitamins-Minerals (CENTRUM SILVER PO), Take 1 tablet by mouth daily., Disp: , Rfl:  .  Multiple Vitamins-Minerals (PRESERVISION AREDS PO), Take 1 tablet by mouth daily., Disp: , Rfl:  .  traMADol (ULTRAM) 50 MG tablet, Take 2 tablets (100 mg total) by mouth 3 (three) times daily. (Patient not taking: Reported on 07/05/2016), Disp: 180 tablet, Rfl: 1  Exam: Current vital signs: BP (!) 144/83 (BP Location: Right Arm)   Pulse 62   Temp 98.6 F (37 C) (Oral)   Resp 16   SpO2 95%  Vital signs in last 24 hours: Temp:  [98.6 F (37 C)] 98.6 F (37 C) (09/24 1723) Pulse Rate:  [62-73] 62 (09/25 0018) Resp:  [  16] 16 (09/25 0018) BP: (144)/(83-111) 144/83 (09/25 0018) SpO2:  [95 %-98 %] 95 % (09/25 0018) GENERAL: Awake, alert in NAD HEENT: - Normocephalic and atraumatic, dry mm, no LN++, no Thyromegally LUNGS - Clear to auscultation bilaterally with no wheezes CV - S1S2 RRR, no m/r/g, equal pulses bilaterally. ABDOMEN - Soft, nontender, nondistended with normoactive BS Ext: warm, well perfused, intact peripheral pulses, no edema   NEURO:  Mental Status: AA&Ox3  Language: speech is .   Naming, repetition, fluency, and comprehension intact. Cranial Nerve: Pupillary exam shows relative left afferent pupillary defect, EOMI but painful, visual fields full, no facial asymmetry, facial sensation intact, diminished hearing on the left  tongue/uvula/soft palate midline, normal sternocleidomastoid and trapezius muscle strength. No evidence of tongue atrophy or fibrillations Motor: 5/5 all over Tone: is normal and bulk is normal Sensation- Intact to light touch bilaterally Coordination: FTN intact bilaterally, no ataxia in BLE. Gait- deferred   Labs I have reviewed labs in epic and the results pertinent to this consultation are: CBC    Component Value Date/Time   WBC 11.0 (H) 01/09/2018 1741   RBC 4.89 01/09/2018 1741   HGB 16.0 (H) 01/09/2018 1748   HCT 47.0 (H) 01/09/2018 1748   PLT 285 01/09/2018 1741   MCV 95.5 01/09/2018 1741   MCH 30.9 01/09/2018 1741   MCHC 32.3 01/09/2018 1741   RDW 13.6 01/09/2018 1741  CMP     Component Value Date/Time   NA 145 01/09/2018 1748   K 3.9 01/09/2018 1748   CL 107 01/09/2018 1748   CO2 27 01/09/2018 1741   GLUCOSE 98 01/09/2018 1748   BUN 12 01/09/2018 1748   CREATININE 0.70 01/09/2018 1748   CALCIUM 9.5 01/09/2018 1741   GFRNONAA >60 01/09/2018 1741   GFRAA >60 01/09/2018 1741   Imaging I have reviewed the images obtained: MRI examination of the brain- changes of the left retrosigmoid craniotomy with no residual recurrent tumor with postoperative left cerebellar encephalomalacia, mild to moderate chronic small vessel disease.  On the postcontrast sequences, possible faint left optic nerve enhancement as marked below by radiology. On my review of the images, FLAIR images show more than expected T2 hyperintense lesion load for chronic small vessel disease and although the lesions are not periventricular, some of them are very round and juxtacortical as can be seen in MS U fiber lesions.      Assessment:  57 year old woman  with possible left optic neuritis with new relative afferent pupillary defect of the left eye.  MRI concerning for feigned left optic nerve enhancement along with multiple T2 hyperintense lesions, although not typical in distribution for demyelination, could be consistent with atypical demyelination. She also has headaches on the left side of the head for nearly a month with no jaw claudication or temporal tenderness.  Low suspicion for giant cell arteritis.  Impression: Left optic neuritis Evaluate for MS Low suspicion for giant cell arteritis  Recommendations: -IV Mentylprednisoloine 1g x 5 days - 1st dose now. -Can attempt to arrange for home infusions after the first 1 or 2 doses are given in the hospital, but if not possible, keep in hospital for completion of 5 days of IV steroid infusions. -Also draw labs for ESR, CRP, although suspicion is low for giant cell arteritis -Check CXR and UA -OP Neurology followup in 2-4 weeks.  [facility] for outpatient neurology  Neuro hospitalist service will continue to follow with you.  -- Amie Portland, MD [facility] Pager: [phone]  If 7pm to 7am, please call on call as listed on AMION.

## 2018-01-10 NOTE — ED Notes (Signed)
Neurologist at bedside. 

## 2018-01-10 NOTE — Progress Notes (Signed)
Patient seen and evaluated, chart reviewed, please see EMR for updated orders. Please see full H&P dictated by admitting physician Dr Myna Hidalgo for same date of service.    Lt Optic Neuritis--- started on IV Solu-Medrol 1 g every 24 hours on 01/10/2018, neurologist recommended 5 days of same, Possible discharge home with home health services in 1 to 2 days to complete 5 days of IV steroid therapy if she tolerates the first couple doses here well  Patient states left eye discomfort and vision appears to be improving  Neurology input appreciated  Patient seen and evaluated, chart reviewed, please see EMR for updated orders. Please see full H&P dictated by admitting physician Dr Myna Hidalgo for same date of service.

## 2018-01-10 NOTE — H&P (Signed)
History and Physical    Amanda Cordova JSH:[id number] DOB: 03-01-1961 DOA: 01/09/2018  PCP: Kathyrn Lass, MD   Patient coming from: Home   Chief Complaint: Blurred vision, headache   HPI: Amanda Cordova is a 57 y.o. female with medical history significant for hypertension, anxiety, and acoustic neuroma status post resection, now presenting to the emergency department for direction of her ophthalmologist for evaluation of blurred vision, headache, and left eye pain.  Patient reports that she been in her usual state of health until approximately a month ago when she began experiencing left-sided headaches and vision change.  She was given a new prescription for corrective lenses, but her condition continued to worsen.  She describes waxing and waning blurred vision, seems to be affecting the left eye, as well as ongoing left-sided headache and more recent left eye pain and lacrimation.  She returned to the ophthalmologist on 01/09/2018 for further evaluation and a exam was concerning for a new left relative afferent pupillary defect and she was directed to the ED for further evaluation with MRI.  Patient denies any recent trauma, denies fevers or chills, and denies chest pain or palpitations.  ED Course: Upon arrival to the ED, patient is found to be afebrile, saturating well on room air, and with vitals otherwise normal.  Chemistry panel is unremarkable and CBC is notable for mild leukocytosis and slight polycythemia.  MRI of brain and orbits was performed and notable for faint left peri-optic neuritis, postsurgical changes, and no acute intracranial abnormality.  Neurology was consulted by the ED physician and recommends a medical admission for ongoing evaluation and management of suspected left optic neuritis.  Review of Systems:  All other systems reviewed and apart from HPI, are negative.  Past Medical History:  Diagnosis Date  . Acoustic neuroma ([place])   . Hypertension     Past  Surgical History:  Procedure Laterality Date  . Brain Neuroma  2011  . BREAST EXCISIONAL BIOPSY Right 2000   benign  . cerical fusion  1995     reports that she has been smoking cigarettes. She has a 35.00 pack-year smoking history. She has never used smokeless tobacco. Her alcohol and drug histories are not on file.  Allergies  Allergen Reactions  . Gabapentin Anaphylaxis and Swelling    Throat and tongue swells Throat and tongue swells  . Tramadol Swelling    Throat and hands    Family History  Problem Relation Age of Onset  . Breast cancer Sister      Prior to Admission medications   Medication Sig Start Date End Date Taking? Authorizing Provider  ALPRAZolam Duanne Moron) 0.25 MG tablet Take 0.25 mg by mouth daily as needed for anxiety or sleep.  08/12/15  Yes [provider]  lisinopril (PRINIVIL,ZESTRIL) 10 MG tablet Take 10 mg by mouth daily. 09/11/15  Yes [provider]  Multiple Vitamins-Minerals (CENTRUM SILVER PO) Take 1 tablet by mouth daily.   Yes [provider]  Multiple Vitamins-Minerals (PRESERVISION AREDS PO) Take 1 tablet by mouth daily.   Yes [provider]    Physical Exam: Vitals:   01/09/18 1723 01/10/18 0018 01/10/18 0128  BP: (!) 144/111 (!) 144/83 128/84  Pulse: 73 62 60  Resp: '16 16 20  '$ Temp: 98.6 F (37 C)  98.1 F (36.7 C)  TempSrc: Oral  Oral  SpO2: 98% 95% 95%  Weight:   103.9 kg  Height:   '5\' 4"'$  (1.626 m)     Constitutional: NAD, calm  Eyes: PERTLA, lids and conjunctivae normal ENMT: Mucous membranes are moist. Posterior pharynx clear of any exudate or lesions.   Neck: normal, supple, no masses, no thyromegaly Respiratory: clear to auscultation bilaterally, no wheezing, no crackles. Normal respiratory effort.    Cardiovascular: S1 & S2 heard, regular rate and rhythm. No extremity edema.   Abdomen: No distension, no tenderness, soft. Bowel sounds normal.  Musculoskeletal: no clubbing / cyanosis. No  joint deformity upper and lower extremities.  .  Skin: no significant rashes, lesions, ulcers. Warm, dry, well-perfused. Neurologic: No facial asymmetry. Sensation intact. Strength 5/5 in all 4 limbs.  Psychiatric: Alert and oriented x 3. Pleasant and cooperative.    Labs on Admission: I have personally reviewed following labs and imaging studies  CBC: Recent Labs  Lab 01/09/18 1741 01/09/18 1748  WBC 11.0*  --   HGB 15.1* 16.0*  HCT 46.7* 47.0*  MCV 95.5  --   PLT 285  --    Basic Metabolic Panel: Recent Labs  Lab 01/09/18 1741 01/09/18 1748  NA 144 145  K 3.9 3.9  CL 108 107  CO2 27  --   GLUCOSE 101* 98  BUN 11 12  CREATININE 0.71 0.70  CALCIUM 9.5  --    GFR: Estimated Creatinine Clearance: 88.9 mL/min (by C-G formula based on SCr of 0.7 mg/dL). Liver Function Tests: No results for input(s): AST, ALT, ALKPHOS, BILITOT, PROT, ALBUMIN in the last 168 hours. No results for input(s): LIPASE, AMYLASE in the last 168 hours. No results for input(s): AMMONIA in the last 168 hours. Coagulation Profile: No results for input(s): INR, PROTIME in the last 168 hours. Cardiac Enzymes: No results for input(s): CKTOTAL, CKMB, CKMBINDEX, TROPONINI in the last 168 hours. BNP (last 3 results) No results for input(s): PROBNP in the last 8760 hours. HbA1C: No results for input(s): HGBA1C in the last 72 hours. CBG: No results for input(s): GLUCAP in the last 168 hours. Lipid Profile: No results for input(s): CHOL, HDL, LDLCALC, TRIG, CHOLHDL, LDLDIRECT in the last 72 hours. Thyroid Function Tests: No results for input(s): TSH, T4TOTAL, FREET4, T3FREE, THYROIDAB in the last 72 hours. Anemia Panel: No results for input(s): VITAMINB12, FOLATE, FERRITIN, TIBC, IRON, RETICCTPCT in the last 72 hours. Urine analysis:    Component Value Date/Time   COLORURINE YELLOW 10/11/2015 [place] 10/11/2015 1422   LABSPEC 1.018 10/11/2015 1422   PHURINE 5.5 10/11/2015 1422    GLUCOSEU NEGATIVE 10/11/2015 1422   HGBUR NEGATIVE 10/11/2015 1422   BILIRUBINUR NEGATIVE 10/11/2015 1422   KETONESUR NEGATIVE 10/11/2015 1422   PROTEINUR NEGATIVE 10/11/2015 1422   NITRITE NEGATIVE 10/11/2015 1422   LEUKOCYTESUR NEGATIVE 10/11/2015 1422   Sepsis Labs: '@LABRCNTIP'$ (procalcitonin:4,lacticidven:4) )No results found for this or any previous visit (from the past 240 hour(s)).   Radiological Exams on Admission: Mr Jeri Cos And Wo Contrast  Result Date: 01/09/2018 CLINICAL DATA:  Progressive LEFT blurriness and eye pain for months. LEFT-sided headache. History of hypertension and LEFT acoustic neuroma. EXAM: MRI HEAD AND ORBITS WITHOUT AND WITH CONTRAST TECHNIQUE: Multiplanar, multiecho pulse sequences of the brain and surrounding structures were obtained without and with intravenous contrast. Multiplanar, multiecho pulse sequences of the orbits and surrounding structures were obtained including fat saturation techniques, before and after intravenous contrast administration. CONTRAST:  10 cc Gadavist COMPARISON:  RIGHT pelvic radiograph January 09, 2018 at 0212 hours. FINDINGS: MRI HEAD FINDINGS INTRACRANIAL CONTENTS: No reduced diffusion to suggest acute ischemia. No susceptibility artifact to suggest hemorrhage. Small  area LEFT cerebellar insist postoperative encephalomalacia. Scattered supratentorial subcentimeter white matter FLAIR T2 hyperintensities in a nonspecific distribution. The ventricles and sulci are normal for patient's age. No suspicious parenchymal signal, masses, mass effect. No abnormal intraparenchymal or extra-axial enhancement. No abnormal extra-axial fluid collections. No extra-axial masses. VASCULAR: Normal major intracranial vascular flow voids present at skull base. SKULL AND UPPER CERVICAL SPINE: No abnormal sellar expansion. Status post LEFT retrosigmoid craniotomy. No suspicious calvarial bone marrow signal. Craniocervical junction maintained. OTHER: None. MRI  ORBITS FINDINGS-moderately motion degraded examination. ORBITS: Ocular globes are intact with normal signal. Lenses are located. Preservation of the orbital fat. Normal appearance of the optic nerve sheath complexes without abnormal optic nerve. Faint LEFT optic nerve sheath enhancement versus motion artifact. Normal symmetric appearance of the extraocular muscles. No intra-ocular mass, signal abnormality nor abnormal enhancement. Superior ophthalmic veins are not enlarged. VISUALIZED SINUSES: Well-aerated. SOFT TISSUES: Normal. IMPRESSION: MRI HEAD: 1. No acute intracranial process. 2. Status post LEFT retrosigmoid craniotomy, no residual or recurrent tumor though not tailored for evaluation. Postoperative LEFT cerebellar encephalomalacia. 3. Mild-to-moderate chronic small vessel ischemic changes, atypical distribution for classic demyelination. MRA HEAD: 1. Moderately motion degraded examination. 2. Faint LEFT optic perineuritis versus motion artifact. Electronically Signed   By: Elon Alas M.D.   On: 01/09/2018 22:01   Mr Rosealee Albee KZ Contrast  Result Date: 01/09/2018 CLINICAL DATA:  Progressive LEFT blurriness and eye pain for months. LEFT-sided headache. History of hypertension and LEFT acoustic neuroma. EXAM: MRI HEAD AND ORBITS WITHOUT AND WITH CONTRAST TECHNIQUE: Multiplanar, multiecho pulse sequences of the brain and surrounding structures were obtained without and with intravenous contrast. Multiplanar, multiecho pulse sequences of the orbits and surrounding structures were obtained including fat saturation techniques, before and after intravenous contrast administration. CONTRAST:  10 cc Gadavist COMPARISON:  RIGHT pelvic radiograph January 09, 2018 at 0212 hours. FINDINGS: MRI HEAD FINDINGS INTRACRANIAL CONTENTS: No reduced diffusion to suggest acute ischemia. No susceptibility artifact to suggest hemorrhage. Small area LEFT cerebellar insist postoperative encephalomalacia. Scattered  supratentorial subcentimeter white matter FLAIR T2 hyperintensities in a nonspecific distribution. The ventricles and sulci are normal for patient's age. No suspicious parenchymal signal, masses, mass effect. No abnormal intraparenchymal or extra-axial enhancement. No abnormal extra-axial fluid collections. No extra-axial masses. VASCULAR: Normal major intracranial vascular flow voids present at skull base. SKULL AND UPPER CERVICAL SPINE: No abnormal sellar expansion. Status post LEFT retrosigmoid craniotomy. No suspicious calvarial bone marrow signal. Craniocervical junction maintained. OTHER: None. MRI ORBITS FINDINGS-moderately motion degraded examination. ORBITS: Ocular globes are intact with normal signal. Lenses are located. Preservation of the orbital fat. Normal appearance of the optic nerve sheath complexes without abnormal optic nerve. Faint LEFT optic nerve sheath enhancement versus motion artifact. Normal symmetric appearance of the extraocular muscles. No intra-ocular mass, signal abnormality nor abnormal enhancement. Superior ophthalmic veins are not enlarged. VISUALIZED SINUSES: Well-aerated. SOFT TISSUES: Normal. IMPRESSION: MRI HEAD: 1. No acute intracranial process. 2. Status post LEFT retrosigmoid craniotomy, no residual or recurrent tumor though not tailored for evaluation. Postoperative LEFT cerebellar encephalomalacia. 3. Mild-to-moderate chronic small vessel ischemic changes, atypical distribution for classic demyelination. MRA HEAD: 1. Moderately motion degraded examination. 2. Faint LEFT optic perineuritis versus motion artifact. Electronically Signed   By: Elon Alas M.D.   On: 01/09/2018 22:01    EKG: Not performed.   Assessment/Plan   1. Optic neuritis  - Presents with left eye pain and blurred vision; MRI concerning for optic neuritis with multiple T2 hyperintense lesions also noted  and concerning for possible atypical demyelination  - Neurology is consulting and much  appreciated, recommending treatment with 1 g IV methylprednisolone daily x5 days, CXR, UA, ESR, CRP, and outpatient LP   - Continue IV steroid, check ESR and CRP as recommended, check CBG while on high-dose steroid and start daily PPI, continue prn benzodiazapine    2. Hypertension  - BP at goal  - Continue lisinopril   3. Anxiety  - Continue prn Xanax     DVT prophylaxis: Lovenox Code Status: Full  Family Communication: Discussed with patient  Consults called: Neurology Admission status: Inpatient     Vianne Bulls, MD [facility] Pager [phone]  If 7PM-7AM, please contact night-coverage [email or web address] Password [email or web address]  01/10/2018, 1:50 AM

## 2018-01-11 LAB — GLUCOSE, CAPILLARY: Glucose-Capillary: 147 mg/dL — ABNORMAL HIGH (ref 70–99)

## 2018-01-11 MED ORDER — SODIUM CHLORIDE 0.9 % IV SOLN
1000.0000 mg | INTRAVENOUS | Status: DC
  Administered 2018-01-11: 1000 mg via INTRAVENOUS
  Filled 2018-01-11: qty 8

## 2018-01-11 MED ORDER — METHYLPREDNISOLONE SODIUM SUCC 1000 MG IJ SOLR: 1000.0000 mg | Freq: Every day | INTRAMUSCULAR | 0 refills | Status: AC

## 2018-01-11 MED ORDER — FLUCONAZOLE 150 MG PO TABS
150.0000 mg | ORAL_TABLET | Freq: Every day | ORAL | Status: DC
  Administered 2018-01-11: 150 mg via ORAL
  Filled 2018-01-11: qty 1

## 2018-01-11 MED ORDER — FLUCONAZOLE 150 MG PO TABS: 150.0000 mg | ORAL_TABLET | Freq: Once | ORAL | 0 refills | Status: AC

## 2018-01-11 NOTE — Discharge Instructions (Signed)
1)You Need home health infusion of  iv Solu-medrol 1 gm every 24 hrs for a total of 5 days, already received 2 doses here (only 3 more days to go) 2)You may take Benadryl 25 mg every night/bedtime as needed for sleep 3)Yyou may take Zantac/ranitidine 150 mg up to twice a day for the next week for stomach protection 4)Follow-up with [facility] in 2 to 3 weeks for recheck/reevaluation and further work-up for possible multiple sclerosis, this work-up may include a lumbar puncture get spinal fluid for further testing for possibility of multiple sclerosis 5)You are taking high-dose steroids/Solu-Medrol so Avoid ibuprofen/Advil/Aleve/Motrin/Goody Powders/Naproxen/BC powders/Meloxicam/Diclofenac/Indomethacin and other Nonsteroidal anti-inflammatory medications as these will make you more likely to bleed and can cause stomach ulcers, can also cause Kidney problems.

## 2018-01-11 NOTE — Care Management Note (Signed)
Case Management Note  Patient Details  Name: Amanda Cordova MRN: [id number] Date of Birth: 1961-03-19  Subjective/Objective:   Pt admitted on 01/09/18 with optic neuritis, ? Multiple sclerosis.  PTA, pt independent, lives with spouse.                   Action/Plan: Pt needs total of 5 days of IV Solumedrol (3rd dose is today); plan HHRN follow up for additional doses. Referral to [facility] for [facility] for home IV infusions.  Plan dc home today after 3rd dose of Solumedrol with peripheral IV.    Expected Discharge Date:  01/11/18               Expected Discharge Plan:  [place]  In-House Referral:     Discharge planning Services  CM Consult  Post Acute Care Choice:  Home Health Choice offered to:  Patient  DME Arranged:  IV pump/equipment DME Agency:  [place] Arranged:  RN [facility] Agency:  [place]  Status of Service:  Completed, signed off  If discussed at [place] of Stay Meetings, dates discussed:    Additional Comments:  Reinaldo Raddle, RN, BSN  Trauma/Neuro ICU Case Manager [phone]

## 2018-01-11 NOTE — Discharge Summary (Signed)
Amanda Cordova, is a 57 y.o. female  DOB 07-19-1960  MRN [id number].  Admission date:  01/09/2018  Admitting Physician  Vianne Bulls, MD  Discharge Date:  01/11/2018   Primary MD  Kathyrn Lass, MD  Recommendations for primary care physician for things to follow:   1)You Need home health infusion of  iv Solu-medrol 1 gm every 24 hrs for a total of 5 days, already received 2 doses here (only 3 more days to go) 2) you may take Benadryl 25 mg every night/bedtime as needed for sleep 3) you may take Zantac/ranitidine 150 mg up to twice a day for the next week for stomach protection 4) follow-up with [facility] in 2 to 3 weeks for recheck/reevaluation and further work-up for possible multiple sclerosis, this work-up may include a lumbar puncture to get spinal fluid for further testing for possibility of multiple sclerosis 5) you are taking high-dose steroids/Solu-Medrol so Avoid ibuprofen/Advil/Aleve/Motrin/Goody Powders/Naproxen/BC powders/Meloxicam/Diclofenac/Indomethacin and other Nonsteroidal anti-inflammatory medications as these will make you more likely to bleed and can cause stomach ulcers, can also cause Kidney problems.   Admission Diagnosis  Optic neuritis [H46.9]   Discharge Diagnosis  Optic neuritis [H46.9]    Principal Problem:   Optic neuritis, left Active Problems:   Hypertension   Anxiety      Past Medical History:  Diagnosis Date  . Acoustic neuroma ([place])   . Hypertension     Past Surgical History:  Procedure Laterality Date  . Brain Neuroma  2011  . BREAST EXCISIONAL BIOPSY Right 2000   benign  . cerical fusion  1995       HPI  from the history and physical done on the day of admission:    Patient coming from: Home   Chief Complaint: Blurred vision, headache   HPI: Amanda Cordova is a 57 y.o. female with medical history significant for  hypertension, anxiety, and acoustic neuroma status post resection, now presenting to the emergency department for direction of her ophthalmologist for evaluation of blurred vision, headache, and left eye pain.  Patient reports that she been in her usual state of health until approximately a month ago when she began experiencing left-sided headaches and vision change.  She was given a new prescription for corrective lenses, but her condition continued to worsen.  She describes waxing and waning blurred vision, seems to be affecting the left eye, as well as ongoing left-sided headache and more recent left eye pain and lacrimation.  She returned to the ophthalmologist on 01/09/2018 for further evaluation and a exam was concerning for a new left relative afferent pupillary defect and she was directed to the ED for further evaluation with MRI.  Patient denies any recent trauma, denies fevers or chills, and denies chest pain or palpitations.  ED Course: Upon arrival to the ED, patient is found to be afebrile, saturating well on room air, and with vitals otherwise normal.  Chemistry panel is unremarkable and CBC is notable for mild leukocytosis and slight polycythemia.  MRI of  brain and orbits was performed and notable for faint left peri-optic neuritis, postsurgical changes, and no acute intracranial abnormality.  Neurology was consulted by the ED physician and recommends a medical admission for ongoing evaluation and management of suspected left optic neuritis.    Hospital Course:      Lt Optic Neuritis--- started on IV Solu-Medrol 1 g every 24 hours on 01/10/2018, neurologist recommended 5 days of same, she received 3 doses of IV Solu-Medrol in the hospital tolerated it well, home health services have been arranged for patient to complete 5 days of IV Solu-Medrol at home, Patient states left eye discomfort and vision appears to be improving. Neurology input appreciated   HTN--- stable, c/n  lisinopril  Discharge Condition: stable  Follow UP   Consults obtained - Neuro  Diet and Activity recommendation:  As advised  Discharge Instructions    Discharge Instructions    Call MD for:  difficulty breathing, headache or visual disturbances   Complete by:  As directed    Call MD for:  persistant dizziness or light-headedness   Complete by:  As directed    Call MD for:  persistant nausea and vomiting   Complete by:  As directed    Call MD for:  severe uncontrolled pain   Complete by:  As directed    Call MD for:  temperature >100.4   Complete by:  As directed    Diet - low sodium heart healthy   Complete by:  As directed    Discharge instructions   Complete by:  As directed    1)You Need home health infusion of  iv Solu-medrol 1 gm every 24 hrs for a total of 5 days, already received 2 doses here (only 3 more days to go) 2) you may take Benadryl 25 mg every night/bedtime as needed for sleep 3) you may take Zantac/ranitidine 150 mg up to twice a day for the next week for stomach protection 4) follow-up with [facility] in 2 to 3 weeks for recheck/reevaluation and further work-up for possible multiple sclerosis, this work-up may include a lumbar puncture get spinal fluid for further testing for possibility of multiple sclerosis 5) you are taking high-dose steroids/Solu-Medrol so Avoid ibuprofen/Advil/Aleve/Motrin/Goody Powders/Naproxen/BC powders/Meloxicam/Diclofenac/Indomethacin and other Nonsteroidal anti-inflammatory medications as these will make you more likely to bleed and can cause stomach ulcers, can also cause Kidney problems.   Increase activity slowly   Complete by:  As directed       Discharge Medications     Allergies as of 01/11/2018      Reactions   Gabapentin Anaphylaxis, Swelling   Throat and tongue swells Throat and tongue swells   Tramadol Swelling   Throat and hands      Medication List    TAKE these medications   ALPRAZolam  0.25 MG tablet Commonly known as:  XANAX Take 0.25 mg by mouth daily as needed for anxiety or sleep.   CENTRUM SILVER PO Take 1 tablet by mouth daily.   PRESERVISION AREDS PO Take 1 tablet by mouth daily.   fluconazole 150 MG tablet Commonly known as:  DIFLUCAN Take 1 tablet (150 mg total) by mouth once for 1 dose. Start taking on:  01/15/2018   lisinopril 10 MG tablet Commonly known as:  PRINIVIL,ZESTRIL Take 10 mg by mouth daily.       Major procedures and Radiology Reports - PLEASE review detailed and final reports for all details, in brief -     Mr Brain  W And Wo Contrast  Result Date: 01/09/2018 CLINICAL DATA:  Progressive LEFT blurriness and eye pain for months. LEFT-sided headache. History of hypertension and LEFT acoustic neuroma. EXAM: MRI HEAD AND ORBITS WITHOUT AND WITH CONTRAST TECHNIQUE: Multiplanar, multiecho pulse sequences of the brain and surrounding structures were obtained without and with intravenous contrast. Multiplanar, multiecho pulse sequences of the orbits and surrounding structures were obtained including fat saturation techniques, before and after intravenous contrast administration. CONTRAST:  10 cc Gadavist COMPARISON:  RIGHT pelvic radiograph January 09, 2018 at 0212 hours. FINDINGS: MRI HEAD FINDINGS INTRACRANIAL CONTENTS: No reduced diffusion to suggest acute ischemia. No susceptibility artifact to suggest hemorrhage. Small area LEFT cerebellar insist postoperative encephalomalacia. Scattered supratentorial subcentimeter white matter FLAIR T2 hyperintensities in a nonspecific distribution. The ventricles and sulci are normal for patient's age. No suspicious parenchymal signal, masses, mass effect. No abnormal intraparenchymal or extra-axial enhancement. No abnormal extra-axial fluid collections. No extra-axial masses. VASCULAR: Normal major intracranial vascular flow voids present at skull base. SKULL AND UPPER CERVICAL SPINE: No abnormal sellar  expansion. Status post LEFT retrosigmoid craniotomy. No suspicious calvarial bone marrow signal. Craniocervical junction maintained. OTHER: None. MRI ORBITS FINDINGS-moderately motion degraded examination. ORBITS: Ocular globes are intact with normal signal. Lenses are located. Preservation of the orbital fat. Normal appearance of the optic nerve sheath complexes without abnormal optic nerve. Faint LEFT optic nerve sheath enhancement versus motion artifact. Normal symmetric appearance of the extraocular muscles. No intra-ocular mass, signal abnormality nor abnormal enhancement. Superior ophthalmic veins are not enlarged. VISUALIZED SINUSES: Well-aerated. SOFT TISSUES: Normal. IMPRESSION: MRI HEAD: 1. No acute intracranial process. 2. Status post LEFT retrosigmoid craniotomy, no residual or recurrent tumor though not tailored for evaluation. Postoperative LEFT cerebellar encephalomalacia. 3. Mild-to-moderate chronic small vessel ischemic changes, atypical distribution for classic demyelination. MRA HEAD: 1. Moderately motion degraded examination. 2. Faint LEFT optic perineuritis versus motion artifact. Electronically Signed   By: Elon Alas M.D.   On: 01/09/2018 22:01   Dg Chest Port 1 View  Result Date: 01/10/2018 CLINICAL DATA:  Visual disturbance. EXAM: PORTABLE CHEST 1 VIEW COMPARISON:  Chest CT dated 01/18/2017. FINDINGS: Normal sized heart. Clear lungs. Lower cervical spine fixation hardware. IMPRESSION: No acute abnormality. Electronically Signed   By: Claudie Revering M.D.   On: 01/10/2018 10:58   Mr Rosealee Albee YT Contrast  Result Date: 01/09/2018 CLINICAL DATA:  Progressive LEFT blurriness and eye pain for months. LEFT-sided headache. History of hypertension and LEFT acoustic neuroma. EXAM: MRI HEAD AND ORBITS WITHOUT AND WITH CONTRAST TECHNIQUE: Multiplanar, multiecho pulse sequences of the brain and surrounding structures were obtained without and with intravenous contrast. Multiplanar, multiecho  pulse sequences of the orbits and surrounding structures were obtained including fat saturation techniques, before and after intravenous contrast administration. CONTRAST:  10 cc Gadavist COMPARISON:  RIGHT pelvic radiograph January 09, 2018 at 0212 hours. FINDINGS: MRI HEAD FINDINGS INTRACRANIAL CONTENTS: No reduced diffusion to suggest acute ischemia. No susceptibility artifact to suggest hemorrhage. Small area LEFT cerebellar insist postoperative encephalomalacia. Scattered supratentorial subcentimeter white matter FLAIR T2 hyperintensities in a nonspecific distribution. The ventricles and sulci are normal for patient's age. No suspicious parenchymal signal, masses, mass effect. No abnormal intraparenchymal or extra-axial enhancement. No abnormal extra-axial fluid collections. No extra-axial masses. VASCULAR: Normal major intracranial vascular flow voids present at skull base. SKULL AND UPPER CERVICAL SPINE: No abnormal sellar expansion. Status post LEFT retrosigmoid craniotomy. No suspicious calvarial bone marrow signal. Craniocervical junction maintained. OTHER: None. MRI ORBITS FINDINGS-moderately motion degraded examination.  ORBITS: Ocular globes are intact with normal signal. Lenses are located. Preservation of the orbital fat. Normal appearance of the optic nerve sheath complexes without abnormal optic nerve. Faint LEFT optic nerve sheath enhancement versus motion artifact. Normal symmetric appearance of the extraocular muscles. No intra-ocular mass, signal abnormality nor abnormal enhancement. Superior ophthalmic veins are not enlarged. VISUALIZED SINUSES: Well-aerated. SOFT TISSUES: Normal. IMPRESSION: MRI HEAD: 1. No acute intracranial process. 2. Status post LEFT retrosigmoid craniotomy, no residual or recurrent tumor though not tailored for evaluation. Postoperative LEFT cerebellar encephalomalacia. 3. Mild-to-moderate chronic small vessel ischemic changes, atypical distribution for classic  demyelination. MRA HEAD: 1. Moderately motion degraded examination. 2. Faint LEFT optic perineuritis versus motion artifact. Electronically Signed   By: Elon Alas M.D.   On: 01/09/2018 22:01    Micro Results   No results found for this or any previous visit (from the past 240 hour(s)).     Today   Subjective    Tanza Pellot today has no new complaints, left eye vision discomfort appears to be improving          Patient has been seen and examined prior to discharge   Objective   Blood pressure 120/69, pulse 65, temperature 98 F (36.7 C), temperature source Oral, resp. rate 20, height 5\' 4"  (1.626 m), weight 103.9 kg, SpO2 94 %.   Intake/Output Summary (Last 24 hours) at 01/11/2018 0853 Last data filed at 01/11/2018 0000 Gross per 24 hour  Intake 139.17 ml  Output -  Net 139.17 ml    Exam Gen:- Awake Alert,  In no apparent distress  HEENT:- [place].AT, No sclera icterus, left eye vision and discomfort is improving Neck-Supple Neck,No JVD,.  Lungs-  CTAB , good air movement CV- S1, S2 normal, regular Abd-  +ve B.Sounds, Abd Soft, No tenderness,    Extremity/Skin:- No  edema,   pulses Psych-affect is appropriate, oriented x3 Neuro-left optic nerve concerns otherwise no new focal deficits, no tremors   Data Review   CBC w Diff:  Lab Results  Component Value Date   WBC 11.0 (H) 01/09/2018   HGB 16.0 (H) 01/09/2018   HCT 47.0 (H) 01/09/2018   PLT 285 01/09/2018    CMP:  Lab Results  Component Value Date   NA 139 01/10/2018   K 3.8 01/10/2018   CL 106 01/10/2018   CO2 21 (L) 01/10/2018   BUN 11 01/10/2018   CREATININE 0.63 01/10/2018  .   Total Discharge time is about 33 minutes  Roxan Hockey M.D on 01/11/2018 at 8:53 AM  Pager---[phone]  Go to [email or web address] - password [email or web address] for contact info  [facility] - Office  [phone]

## 2018-01-12 DIAGNOSIS — H468 Other optic neuritis: Secondary | ICD-10-CM | POA: Diagnosis not present

## 2018-01-17 DIAGNOSIS — Z86018 Personal history of other benign neoplasm: Secondary | ICD-10-CM | POA: Diagnosis not present

## 2018-01-17 DIAGNOSIS — M859 Disorder of bone density and structure, unspecified: Secondary | ICD-10-CM | POA: Diagnosis not present

## 2018-01-17 DIAGNOSIS — R2689 Other abnormalities of gait and mobility: Secondary | ICD-10-CM | POA: Diagnosis not present

## 2018-01-17 DIAGNOSIS — G4733 Obstructive sleep apnea (adult) (pediatric): Secondary | ICD-10-CM | POA: Diagnosis not present

## 2018-01-17 DIAGNOSIS — E785 Hyperlipidemia, unspecified: Secondary | ICD-10-CM | POA: Diagnosis not present

## 2018-01-17 DIAGNOSIS — Z6841 Body Mass Index (BMI) 40.0 and over, adult: Secondary | ICD-10-CM | POA: Diagnosis not present

## 2018-01-17 DIAGNOSIS — Z0000 Encounter for general adult medical examination without abnormal findings: Secondary | ICD-10-CM | POA: Diagnosis not present

## 2018-01-17 DIAGNOSIS — I10 Essential (primary) hypertension: Secondary | ICD-10-CM | POA: Diagnosis not present

## 2018-01-25 ENCOUNTER — Ambulatory Visit: Payer: Medicare HMO | Admitting: Neurology

## 2018-01-25 ENCOUNTER — Encounter: Payer: Self-pay | Admitting: Neurology

## 2018-01-25 VITALS — BP 143/72 | HR 64 | Ht 64.0 in | Wt 232.0 lb

## 2018-01-25 DIAGNOSIS — H469 Unspecified optic neuritis: Secondary | ICD-10-CM | POA: Diagnosis not present

## 2018-01-25 NOTE — Patient Instructions (Signed)
Lumbar Puncture, Care After °Refer to this sheet in the next few weeks. These instructions provide you with information on caring for yourself after your procedure. Your health care provider may also give you more specific instructions. Your treatment has been planned according to current medical practices, but problems sometimes occur. Call your health care provider if you have any problems or questions after your procedure. °What can I expect after the procedure? °After your procedure, it is typical to have the following sensations: °· Mild discomfort or pain at the insertion site. °· Mild headache that is relieved with pain medicines. ° °Follow these instructions at home: ° °· Avoid lifting anything heavier than 10 lb (4.5 kg) for at least 12 hours after the procedure. °· Drink enough fluids to keep your urine clear or pale yellow. °Contact a health care provider if: °· You have fever or chills. °· You have nausea or vomiting. °· You have a headache that lasts for more than 2 days. °Get help right away if: °· You have any numbness or tingling in your legs. °· You are unable to control your bowel or bladder. °· You have bleeding or swelling in your back at the insertion site. °· You are dizzy or faint. °This information is not intended to replace advice given to you by your health care provider. Make sure you discuss any questions you have with your health care provider. °Document Released: 04/09/2013 Document Revised: 09/10/2015 Document Reviewed: 12/11/2012 °Elsevier Interactive Patient Education © 2017 [employer] ° °

## 2018-01-25 NOTE — Progress Notes (Signed)
SLEEP MEDICINE CLINIC   Provider:  Larey Seat, M D  Primary Care Physician:  Kathyrn Lass, MD   Referring Provider: [facility] [place]    Chief Complaint  Patient presents with  . New Patient (Initial Visit)    pt alone, rm 10. pt states that she went to eye MD, 3 days later she was unable to see and she went to ER. still complains of headache, blurry vision and eyes can hurt intermittently. she has completed the course of the steroids that were ordered when she was dc home from hospital.    Chief complaint according to patient : " I need to know why my vision changed"   HPI:  Amanda Cordova is a 57 y.o. female presents here following a change of vision which occurred in August/ September 2019,  She was first seen at [facility] for vision changes also because she had developed headaches and had wondered if her vision was part of the problem.  Both the headaches and her vision changes were affecting the left side.  Dr. Trudie Buckler dilated her eyes in August and examined her, finding evidence of possible optic neuritis.  The patient also reports that her eye movements heard that her vision was so blurred she could not drive seeing not even the license plate in front of her.  She presented after this evaluation to the emergency room on September 24, on Monday when she again noticed vision changes in the meantime she had just gotten new glasses which would have not treated optic neuritis.   There was a period of over 3 weeks  between the diagnosis by ophthalmology and the presentation to the emergency room. She received steroids for 5 Days IV, but not followed by any oral taper. She removed the Iv herself.  Vision improved - blurred vision, milder headaches.   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 She had follow-up for this condition at [facility] neurosurgery with Dr. Salomon Fick, and is according to my chart still listed as an active patient. She had an MRI there on April 1st 2019!!   She has an established pain doctor, orthopedics. She remains obese.  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  She is also referred to Dr. Maxwell Caul for Sleep OSA evaluation.   Social history: married, moved to [place] 6 years ago, she is not employed , she and her husband are both disabled.  4 grown children/ 3 in [place] and one in [place].  Quit smoking on September 24th 2019 - 14 days ago.  ETOH none, caffeine : 2-3 cups, no soda. No iced tea.   Review of Systems: Out of a complete 14 system review, the patient complains of only the following symptoms, and all other reviewed systems are negative.    Social History   Socioeconomic History  . Marital status: Married    Spouse name: Not on file  . Number of children: Not on file  . Years of education: Not on file  . Highest education level: Not on file  Occupational History  . Not on file  Social Needs  . Financial resource strain: Not on file  . Food insecurity:    Worry: Not on file    Inability: Not on file  .  Transportation needs:    Medical: Not on file    Non-medical: Not on file  Tobacco Use  . Smoking status: Former Smoker    Packs/day: 1.00    Years: 35.00    Pack years: 35.00    Types: Cigarettes    Last attempt to quit: 01/09/2018    Years since quitting: 0.0  . Smokeless tobacco: Never Used  Substance and Sexual Activity  . Alcohol use: Not on file  . Drug use: Not on file  . Sexual activity: Not on file  Lifestyle  . Physical activity:    Days per week: Not on file    Minutes per session: Not on file  . Stress: Not on file  Relationships  . Social connections:    Talks on phone: Not on file    Gets together: Not on file    Attends  religious service: Not on file    Active member of club or organization: Not on file    Attends meetings of clubs or organizations: Not on file    Relationship status: Not on file  . Intimate partner violence:    Fear of current or ex partner: Not on file    Emotionally abused: Not on file    Physically abused: Not on file    Forced sexual activity: Not on file  Other Topics Concern  . Not on file  Social History Narrative  . Not on file    Family History  Problem Relation Age of Onset  . Breast cancer Sister   . COPD Mother   . Heart failure Mother   . Heart attack Father     Past Medical History:  Diagnosis Date  . Acoustic neuroma ([place])   . Hypertension     Past Surgical History:  Procedure Laterality Date  . Brain Neuroma  2011  . BREAST EXCISIONAL BIOPSY Right 2000   benign  . cerical fusion  1995  . CHOLECYSTECTOMY    . HYSTERECTOMY ABDOMINAL WITH SALPINGECTOMY      Current Outpatient Medications  Medication Sig Dispense Refill  . ALPRAZolam (XANAX) 0.25 MG tablet Take 0.25 mg by mouth daily as needed for anxiety or sleep.     Marland Kitchen lisinopril (PRINIVIL,ZESTRIL) 10 MG tablet Take 10 mg by mouth daily.    . Multiple Vitamins-Minerals (CENTRUM SILVER PO) Take 1 tablet by mouth daily.    . Multiple Vitamins-Minerals (PRESERVISION AREDS PO) Take 1 tablet by mouth daily.    . Vitamin D, Ergocalciferol, (DRISDOL) 50000 units CAPS capsule      No current facility-administered medications for this visit.     Allergies as of 01/25/2018 - Review Complete 01/25/2018  Allergen Reaction Noted  . Gabapentin Anaphylaxis and Swelling 05/09/2016  . Tramadol Swelling 07/17/2017    Vitals: BP (!) 143/72   Pulse 64   Ht 5\' 4"  (1.626 m)   Wt 232 lb (105.2 kg)   BMI 39.82 kg/m  Last Weight:  Wt Readings from Last 1 Encounters:  01/25/18 232 lb (105.2 kg)   JYN:WGNF mass index is 39.82 kg/m.     Last Height:   Ht Readings from Last 1 Encounters:  01/25/18 5\' 4"  (1.626  m)    Physical exam:  General: The patient is awake, alert and appears not in acute distress. The patient is well groomed. Head: Normocephalic, atraumatic. Neck is supple. Mallampati 3,  neck circumference:16.25 . Nasal airflow patent ,  Cardiovascular:  Regular rate and rhythm , without  murmurs or  carotid bruit, and without distended neck veins. Respiratory: Lungs are clear to auscultation. Skin:  Without evidence of edema, or rash Trunk: BMI is 40.   Neurologic exam : The patient is awake and alert, oriented to place and time.   Memory subjective  described as intact.    Attention span & concentration ability appears normal.  Speech is fluent,  without  dysarthria, dysphonia or aphasia.  Mood and affect are appropriate.  Cranial nerves: Pupils are equal and briskly reactive to light. Funduscopic exam without  evidence of pallor or edema. There is an early cataract Extraocular movements  in vertical and horizontal planes intact and without nystagmus. Visual fields by finger perimetry are intact. Hearing to finger rub intact.  Facial sensation intact to fine touch. Facial motor strength is symmetric and tongue and uvula move midline. Shoulder shrug was symmetrical.   Motor exam: There is symmetric muscle bulk, tone and strength, both shoulders appear droopy left is slightly higher than the right, there is no change in hand muscles, the patient reports that she feels her right leg is clumsy and sometimes gives way.  She is also status post series orthopedic interventions.  Sensory:  Fine touch, pinprick and vibration were tested in all extremities. Proprioception tested in the upper extremities was normal.  Coordination: Rapid alternating movements in the fingers/hands was normal. Finger-to-nose maneuver  normal without evidence of ataxia, dysmetria or tremor.  Gait and station: Patient walks with a cane as  assistive device.  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   Symmetric and synchronous constriction of the pupil to accommodation, normal eye movements no nystagmus, no visual field deficit.  But a subjective report of more blurring of the left eye.  Headaches and eye pain both have improved after IV steroids but she was not given a taper which concerns me.  Her MRI is not giving any evidence of the presence of multiple sclerosis.  My plan is to not read in state oral steroids after a 12-day hiatus, I think that would be an indication for hypoglycemia without any of the additive benefit.  I will refer her for further evaluation of optic neuritis back to her ophthalmologist, she has an appointment on Friday. It is indicated to stay in her established neurological network, which means : I will not repeat MRI at this point.  I will offer an LP, but she can have this also with [facility] neurology.    1)  Following up with [facility] neurologist and a second opinion with MS clinic at [facility], which is excellent.    The patient was advised of the nature of the diagnosed disorder , the treatment options and the  risks for general health and wellness arising from not treating the condition.  I spent more than 45 minutes of face to face time with the patient. Greater than 50% of time was spent in counseling and coordination of care. We have discussed the diagnosis and differential and I answered the patient's questions.    Plan:  Treatment plan and additional workup : I will go ahead and order the spinal fluid exam to [facility] imaging under fluoroscopy.  She got better under steroids. She has  a change in colour - perception , red is red in the right eye, but is seen as  brown in the left eye. Optic nerve involvement is likly, in spite of the MRI findings.    If there is no evidence of oligoclonal bands she will go ahead and see a general neurologist with [facility],  and if she is positive for oligoclonal bands she will have a choice to either see Dr. Arlice Colt or Dr. Evelene Croon or Dr. Ala Bent, all of them immunology specialist.    Larey Seat, MD 42/39/5320, 23:34 AM  Certified in Neurology by ABPN Certified in Sleep Medicine by [facility] [address] [place], [place] [address]

## 2018-01-30 DIAGNOSIS — H469 Unspecified optic neuritis: Secondary | ICD-10-CM | POA: Diagnosis not present

## 2018-02-01 ENCOUNTER — Ambulatory Visit (INDEPENDENT_AMBULATORY_CARE_PROVIDER_SITE_OTHER)
Admission: RE | Admit: 2018-02-01 | Discharge: 2018-02-01 | Disposition: A | Discharge status: Home / Self Care | Payer: Medicare HMO | Source: Ambulatory Visit | Attending: Acute Care | Admitting: Acute Care

## 2018-02-01 DIAGNOSIS — Z122 Encounter for screening for malignant neoplasm of respiratory organs: Secondary | ICD-10-CM

## 2018-02-01 DIAGNOSIS — L0101 Non-bullous impetigo: Secondary | ICD-10-CM | POA: Diagnosis not present

## 2018-02-01 DIAGNOSIS — F17210 Nicotine dependence, cigarettes, uncomplicated: Secondary | ICD-10-CM | POA: Diagnosis not present

## 2018-02-01 DIAGNOSIS — L7211 Pilar cyst: Secondary | ICD-10-CM | POA: Diagnosis not present

## 2018-02-01 DIAGNOSIS — Z87891 Personal history of nicotine dependence: Secondary | ICD-10-CM | POA: Diagnosis not present

## 2018-02-01 DIAGNOSIS — L02211 Cutaneous abscess of abdominal wall: Secondary | ICD-10-CM | POA: Diagnosis not present

## 2018-02-05 ENCOUNTER — Other Ambulatory Visit: Payer: Self-pay | Admitting: Acute Care

## 2018-02-05 DIAGNOSIS — Z122 Encounter for screening for malignant neoplasm of respiratory organs: Secondary | ICD-10-CM

## 2018-02-05 DIAGNOSIS — Z87891 Personal history of nicotine dependence: Secondary | ICD-10-CM

## 2018-02-06 DIAGNOSIS — G4733 Obstructive sleep apnea (adult) (pediatric): Secondary | ICD-10-CM | POA: Diagnosis not present

## 2018-02-06 DIAGNOSIS — G4700 Insomnia, unspecified: Secondary | ICD-10-CM | POA: Diagnosis not present

## 2018-02-15 ENCOUNTER — Telehealth: Payer: Self-pay | Admitting: *Deleted

## 2018-02-15 ENCOUNTER — Ambulatory Visit
Admission: RE | Admit: 2018-02-15 | Discharge: 2018-02-15 | Disposition: A | Discharge status: Home / Self Care | Payer: Medicare HMO | Source: Ambulatory Visit | Attending: Neurology | Admitting: Neurology

## 2018-02-15 ENCOUNTER — Encounter: Payer: Self-pay | Admitting: Neurology

## 2018-02-15 VITALS — BP 127/84 | HR 60

## 2018-02-15 DIAGNOSIS — H538 Other visual disturbances: Secondary | ICD-10-CM | POA: Diagnosis not present

## 2018-02-15 DIAGNOSIS — H469 Unspecified optic neuritis: Secondary | ICD-10-CM

## 2018-02-15 NOTE — Discharge Instructions (Signed)

## 2018-02-15 NOTE — Progress Notes (Signed)
Blood obtained from pt's L AC for LP labs. Pt tolerated procedure well. Site is unremarkable. 

## 2018-02-15 NOTE — Telephone Encounter (Signed)
Spoke with patient and informed her that her LP had a normal procedure sequence with a normal opening pressure. Advised her the CSF labs are pending, and she will get a call when those results are available. She verbalized understanding, appreciation.

## 2018-02-20 ENCOUNTER — Telehealth: Payer: Self-pay | Admitting: Neurology

## 2018-02-20 LAB — TIQ- AMBIGUOUS ORDER

## 2018-02-20 NOTE — Telephone Encounter (Signed)
Called the pt and advised her that her LP labs have come back all WNL. We were waiting for the oligoclonal bands to come back and they posted today and stated there were no oligoclonal bands present which means MS was ruled out between the MRI and LP. Pt asked what the next step. I informed her that based off the work up being completed Dr Brett Fairy recommended the patient to follow up with her neurologist that she is already established with Dr Salomon Fick. Pt verbalized understanding and was appreciative for the call.

## 2018-02-20 NOTE — Telephone Encounter (Signed)
-----   Message from Larey Seat, MD sent at 02/19/2018  8:06 AM EST ----- Normal glucose, normal WBC count, and only very mildly elevated protein. Pending are: oligo-clonals. If negative , will not need to further follow , ruled out MS ( in context of normal MRI ) .  No steroids were reinstated after 01-25-2018 visit.

## 2018-02-21 LAB — OTHER LAB TEST

## 2018-02-22 DIAGNOSIS — G4733 Obstructive sleep apnea (adult) (pediatric): Secondary | ICD-10-CM | POA: Diagnosis not present

## 2018-02-22 DIAGNOSIS — H468 Other optic neuritis: Secondary | ICD-10-CM | POA: Diagnosis not present

## 2018-02-27 ENCOUNTER — Encounter: Payer: Self-pay | Admitting: Cardiology

## 2018-02-27 ENCOUNTER — Ambulatory Visit: Payer: Medicare HMO | Admitting: Cardiology

## 2018-02-27 VITALS — BP 132/76 | HR 75 | Ht 64.0 in | Wt 237.0 lb

## 2018-02-27 DIAGNOSIS — I2510 Atherosclerotic heart disease of native coronary artery without angina pectoris: Secondary | ICD-10-CM

## 2018-02-27 DIAGNOSIS — E785 Hyperlipidemia, unspecified: Secondary | ICD-10-CM

## 2018-02-27 DIAGNOSIS — R0600 Dyspnea, unspecified: Secondary | ICD-10-CM

## 2018-02-27 DIAGNOSIS — I700 Atherosclerosis of aorta: Secondary | ICD-10-CM | POA: Diagnosis not present

## 2018-02-27 DIAGNOSIS — J449 Chronic obstructive pulmonary disease, unspecified: Secondary | ICD-10-CM | POA: Diagnosis not present

## 2018-02-27 DIAGNOSIS — R079 Chest pain, unspecified: Secondary | ICD-10-CM

## 2018-02-27 MED ORDER — ASPIRIN EC 81 MG PO TBEC: 81.0000 mg | DELAYED_RELEASE_TABLET | Freq: Every day | ORAL | Status: DC

## 2018-02-27 MED ORDER — ATORVASTATIN CALCIUM 20 MG PO TABS: 20.0000 mg | ORAL_TABLET | Freq: Every day | ORAL | 3 refills | Status: DC

## 2018-02-27 NOTE — Patient Instructions (Signed)
Medication Instructions:  Your physician has recommended you make the following change in your medication:  1) START Aspirin 81mg  daily 2) START Atorvastatin (Lipitor) 20mg  daily. An Rx has been sent to your mail order pharmacy  If you need a refill on your cardiac medications before your next appointment, please call your pharmacy.   Lab work: Your physician recommends that you return for a FASTING lipid profile and hepatic in 6-8 weeks. Please bring the lab slip given you to you today.  If you have labs (blood work) drawn today and your tests are completely normal, you will receive your results only by: Marland Kitchen MyChart Message (if you have MyChart) OR . A paper copy in the mail If you have any lab test that is abnormal or we need to change your treatment, we will call you to review the results.  Testing/Procedures: Your physician has requested that you have an echocardiogram. Echocardiography is a painless test that uses sound waves to create images of your heart. It provides your doctor with information about the size and shape of your heart and how well your heart's chambers and valves are working. This procedure takes approximately one hour. There are no restrictions for this procedure.  Your physician has requested that you have en exercise stress myoview. For further information please visit [email or web address]. Please follow instruction sheet, as given.    Follow-Up: At [facility], you and your health needs are our priority.  As part of our continuing mission to provide you with exceptional heart care, we have created designated Provider Care Teams.  These Care Teams include your primary Cardiologist (physician) and Advanced Practice Providers (APPs -  Physician Assistants and Nurse Practitioners) who all work together to provide you with the care you need, when you need it. You will need a follow up appointment in 6 months.  Please call our office 2 months in advance to schedule this  appointment.  You may see  Dr.Crenshaw or one of the following Advanced Practice Providers on your designated Care Team:   Kerin Ransom, PA-C Roby Lofts, [place] . Sande Rives, PA-C  Any Other Special Instructions Will Be Listed Below (If Applicable).

## 2018-02-27 NOTE — Progress Notes (Signed)
Donney Rankins, MD Reason for referral-coronary artery disease  HPI: 57 year old female for evaluation of coronary artery disease/coronary calcification at request of Kathyrn Lass, MD.  Recently had chest CT for lung cancer screening.  Noted to have aortic atherosclerosis and coronary artery calcification.  COPD also noted.  Patient has dyspnea on exertion but no orthopnea, PND or pedal edema.  She has occasional chest discomfort that is substernal.  It does not radiate.  Can occur both with exertion and at rest and resolve spontaneously.  Because of the above cardiology asked to evaluate.  Current Outpatient Medications  Medication Sig Dispense Refill  . ALPRAZolam (XANAX) 0.25 MG tablet Take 0.25 mg by mouth daily as needed for anxiety or sleep.     Marland Kitchen lisinopril (PRINIVIL,ZESTRIL) 10 MG tablet Take 10 mg by mouth daily.    . Multiple Vitamins-Minerals (CENTRUM SILVER PO) Take 1 tablet by mouth daily.    . Vitamin D, Ergocalciferol, (DRISDOL) 50000 units CAPS capsule Take 50,000 Units by mouth every 7 (seven) days.      No current facility-administered medications for this visit.     Allergies  Allergen Reactions  . Gabapentin Anaphylaxis and Swelling    Throat and tongue swells Throat and tongue swells  . Tramadol Swelling    Throat and hands     Past Medical History:  Diagnosis Date  . Acoustic neuroma ([place])   . CAD (coronary artery disease)   . COPD (chronic obstructive pulmonary disease) ([place])   . Hypertension     Past Surgical History:  Procedure Laterality Date  . Brain Neuroma  2011  . BREAST EXCISIONAL BIOPSY Right 2000   benign  . cerical fusion  1995  . CHOLECYSTECTOMY    . HYSTERECTOMY ABDOMINAL WITH SALPINGECTOMY      Social History   Socioeconomic History  . Marital status: Married    Spouse name: Not on file  . Number of children: 4  . Years of education: Not on file  . Highest education level: Not on file  Occupational History  . Not on  file  Social Needs  . Financial resource strain: Not on file  . Food insecurity:    Worry: Not on file    Inability: Not on file  . Transportation needs:    Medical: Not on file    Non-medical: Not on file  Tobacco Use  . Smoking status: Former Smoker    Packs/day: 1.00    Years: 35.00    Pack years: 35.00    Types: Cigarettes    Last attempt to quit: 01/09/2018    Years since quitting: 0.1  . Smokeless tobacco: Never Used  Substance and Sexual Activity  . Alcohol use: Not Currently  . Drug use: Never  . Sexual activity: Not on file  Lifestyle  . Physical activity:    Days per week: Not on file    Minutes per session: Not on file  . Stress: Not on file  Relationships  . Social connections:    Talks on phone: Not on file    Gets together: Not on file    Attends religious service: Not on file    Active member of club or organization: Not on file    Attends meetings of clubs or organizations: Not on file    Relationship status: Not on file  . Intimate partner violence:    Fear of current or ex partner: Not on file    Emotionally abused: Not on file  Physically abused: Not on file    Forced sexual activity: Not on file  Other Topics Concern  . Not on file  Social History Narrative  . Not on file    Family History  Problem Relation Age of Onset  . Breast cancer Sister   . COPD Mother   . Heart failure Mother   . Heart attack Father     ROS: arthralgias but no fevers or chills, productive cough, hemoptysis, dysphasia, odynophagia, melena, hematochezia, dysuria, hematuria, rash, seizure activity, orthopnea, PND, pedal edema, claudication. Remaining systems are negative.  Physical Exam:   Blood pressure 132/76, pulse 75, height 5\' 4"  (1.626 m), weight 237 lb (107.5 kg).  General:  Well developed/obese in NAD Skin warm/dry Patient not depressed No peripheral clubbing Back-normal HEENT-normal/normal eyelids Neck supple/normal carotid upstroke bilaterally; no  bruits; no JVD; no thyromegaly chest - CTA/ normal expansion CV - RRR/normal S1 and S2; no murmurs, rubs or gallops;  PMI nondisplaced Abdomen -NT/ND, no HSM, no mass, + bowel sounds, no bruit 2+ femoral pulses, no bruits Ext-no edema, chords, 2+ DP Neuro-grossly nonfocal  ECG -normal sinus rhythm at a rate of 75.  No ST changes.  Personally reviewed  A/P  1 coronary calcification/coronary artery disease-noted on CT scan.  Add aspirin 81 mg daily.  Add statin.  We will begin with Lipitor 20 mg daily.  Check lipids and liver in 4 weeks.  2 chest pain-symptoms with both typical and atypical features.  She has a strong family history of coronary disease and there is calcium noted in her coronaries on CT scan.  Plan stress nuclear study for risk stratification.  3 dyspnea-plan echocardiogram to assess LV function.  4 hypertension-blood pressure is controlled.  Continue present medications and follow.  5 tobacco abuse-she has recently discontinued.  I encouraged her to continue to avoid.  Kirk Ruths, MD

## 2018-03-05 ENCOUNTER — Other Ambulatory Visit: Payer: Self-pay

## 2018-03-05 ENCOUNTER — Ambulatory Visit (HOSPITAL_COMMUNITY): Payer: Medicare HMO | Attending: Cardiology

## 2018-03-05 DIAGNOSIS — R0600 Dyspnea, unspecified: Secondary | ICD-10-CM | POA: Diagnosis not present

## 2018-03-06 ENCOUNTER — Telehealth (HOSPITAL_COMMUNITY): Payer: Self-pay

## 2018-03-06 LAB — CSF CELL COUNT WITH DIFFERENTIAL
RBC Count, CSF: 3 cells/uL (ref 0–10)
WBC, CSF: 4 cells/uL (ref 0–5)

## 2018-03-06 LAB — MYELIN BASIC PROTEIN, CSF: Myelin Basic Protein: <2 mcg/L (ref 2.0–4.0)

## 2018-03-06 LAB — CSF CULTURE W GRAM STAIN
Result:: NO GROWTH
SPECIMEN QUALITY:: ADEQUATE

## 2018-03-06 LAB — CSF CULTURE: MICRO NUMBER:: 91311547

## 2018-03-06 LAB — GLUCOSE, CSF: Glucose, CSF: 56 mg/dL (ref 40–80)

## 2018-03-06 LAB — VDRL, CSF: VDRL Quant, CSF: NONREACTIVE

## 2018-03-06 LAB — OLIGOCLONAL BANDS, CSF + SERM

## 2018-03-06 LAB — PROTEIN, CSF: Total Protein, CSF: 49 mg/dL — ABNORMAL HIGH (ref 15–45)

## 2018-03-06 NOTE — Telephone Encounter (Signed)
Encounter complete. 

## 2018-03-08 ENCOUNTER — Ambulatory Visit (HOSPITAL_COMMUNITY)
Admission: RE | Admit: 2018-03-08 | Discharge: 2018-03-08 | Disposition: A | Discharge status: Home / Self Care | Payer: Medicare HMO | Source: Ambulatory Visit | Attending: Cardiovascular Disease | Admitting: Cardiovascular Disease

## 2018-03-08 ENCOUNTER — Ambulatory Visit (HOSPITAL_COMMUNITY): Payer: Medicare HMO

## 2018-03-08 DIAGNOSIS — R079 Chest pain, unspecified: Secondary | ICD-10-CM | POA: Insufficient documentation

## 2018-03-08 MED ORDER — TECHNETIUM TC 99M TETROFOSMIN IV KIT
29.0000 | PACK | Freq: Once | INTRAVENOUS | Status: AC | PRN
  Administered 2018-03-08: 29 via INTRAVENOUS
  Filled 2018-03-08: qty 29

## 2018-03-09 ENCOUNTER — Ambulatory Visit (HOSPITAL_COMMUNITY)
Admission: RE | Admit: 2018-03-09 | Discharge: 2018-03-09 | Disposition: A | Discharge status: Home / Self Care | Payer: Medicare HMO | Source: Ambulatory Visit | Attending: Cardiology | Admitting: Cardiology

## 2018-03-09 ENCOUNTER — Inpatient Hospital Stay (HOSPITAL_COMMUNITY): Admission: RE | Admit: 2018-03-09 | Payer: Medicare HMO | Source: Ambulatory Visit

## 2018-03-09 MED ORDER — TECHNETIUM TC 99M TETROFOSMIN IV KIT
31.7000 | PACK | Freq: Once | INTRAVENOUS | Status: AC | PRN
  Administered 2018-03-09: 31.7 via INTRAVENOUS

## 2018-03-10 LAB — MYOCARDIAL PERFUSION IMAGING
Estimated workload: 7 METS
Exercise duration (min): 6 min
Exercise duration (sec): 0 s
LV dias vol: 101 mL (ref 46–106)
LV sys vol: 41 mL
MPHR: 161 {beats}/min
Peak HR: 157 {beats}/min
Percent HR: 97 %
RPE: 19
Rest HR: 64 {beats}/min
SDS: 2
SRS: 4
SSS: 6
TID: 0.8

## 2018-03-13 DIAGNOSIS — H468 Other optic neuritis: Secondary | ICD-10-CM | POA: Diagnosis not present

## 2018-03-13 DIAGNOSIS — G4733 Obstructive sleep apnea (adult) (pediatric): Secondary | ICD-10-CM | POA: Diagnosis not present

## 2018-03-24 DIAGNOSIS — H468 Other optic neuritis: Secondary | ICD-10-CM | POA: Diagnosis not present

## 2018-03-24 DIAGNOSIS — G4733 Obstructive sleep apnea (adult) (pediatric): Secondary | ICD-10-CM | POA: Diagnosis not present

## 2018-04-23 DIAGNOSIS — E559 Vitamin D deficiency, unspecified: Secondary | ICD-10-CM | POA: Diagnosis not present

## 2018-04-24 DIAGNOSIS — H468 Other optic neuritis: Secondary | ICD-10-CM | POA: Diagnosis not present

## 2018-04-24 DIAGNOSIS — G4733 Obstructive sleep apnea (adult) (pediatric): Secondary | ICD-10-CM | POA: Diagnosis not present

## 2018-05-07 MED ORDER — ROSUVASTATIN CALCIUM 10 MG PO TABS: 10.0000 mg | ORAL_TABLET | Freq: Every day | ORAL | 3 refills | Status: DC

## 2018-05-10 DIAGNOSIS — G4733 Obstructive sleep apnea (adult) (pediatric): Secondary | ICD-10-CM | POA: Diagnosis not present

## 2018-05-25 DIAGNOSIS — G4733 Obstructive sleep apnea (adult) (pediatric): Secondary | ICD-10-CM | POA: Diagnosis not present

## 2018-05-25 DIAGNOSIS — H468 Other optic neuritis: Secondary | ICD-10-CM | POA: Diagnosis not present

## 2018-05-28 DIAGNOSIS — M79671 Pain in right foot: Secondary | ICD-10-CM | POA: Diagnosis not present

## 2018-06-08 DIAGNOSIS — S93401A Sprain of unspecified ligament of right ankle, initial encounter: Secondary | ICD-10-CM | POA: Diagnosis not present

## 2018-06-11 DIAGNOSIS — H468 Other optic neuritis: Secondary | ICD-10-CM | POA: Diagnosis not present

## 2018-06-11 DIAGNOSIS — G4733 Obstructive sleep apnea (adult) (pediatric): Secondary | ICD-10-CM | POA: Diagnosis not present

## 2018-06-23 DIAGNOSIS — G4733 Obstructive sleep apnea (adult) (pediatric): Secondary | ICD-10-CM | POA: Diagnosis not present

## 2018-06-23 DIAGNOSIS — H468 Other optic neuritis: Secondary | ICD-10-CM | POA: Diagnosis not present

## 2018-07-20 DIAGNOSIS — E559 Vitamin D deficiency, unspecified: Secondary | ICD-10-CM | POA: Diagnosis not present

## 2018-07-20 DIAGNOSIS — G4733 Obstructive sleep apnea (adult) (pediatric): Secondary | ICD-10-CM | POA: Diagnosis not present

## 2018-07-20 DIAGNOSIS — I10 Essential (primary) hypertension: Secondary | ICD-10-CM | POA: Diagnosis not present

## 2018-07-20 DIAGNOSIS — Z86018 Personal history of other benign neoplasm: Secondary | ICD-10-CM | POA: Diagnosis not present

## 2018-07-24 DIAGNOSIS — G4733 Obstructive sleep apnea (adult) (pediatric): Secondary | ICD-10-CM | POA: Diagnosis not present

## 2018-07-24 DIAGNOSIS — H468 Other optic neuritis: Secondary | ICD-10-CM | POA: Diagnosis not present

## 2018-08-10 DIAGNOSIS — G4733 Obstructive sleep apnea (adult) (pediatric): Secondary | ICD-10-CM | POA: Diagnosis not present

## 2018-08-23 DIAGNOSIS — G4733 Obstructive sleep apnea (adult) (pediatric): Secondary | ICD-10-CM | POA: Diagnosis not present

## 2018-08-23 DIAGNOSIS — H468 Other optic neuritis: Secondary | ICD-10-CM | POA: Diagnosis not present

## 2018-08-28 ENCOUNTER — Telehealth: Payer: Self-pay | Admitting: *Deleted

## 2018-08-28 NOTE — Telephone Encounter (Signed)
A message was left, re: follow up visit. 

## 2018-09-23 DIAGNOSIS — H468 Other optic neuritis: Secondary | ICD-10-CM | POA: Diagnosis not present

## 2018-09-23 DIAGNOSIS — G4733 Obstructive sleep apnea (adult) (pediatric): Secondary | ICD-10-CM | POA: Diagnosis not present

## 2018-10-23 DIAGNOSIS — G4733 Obstructive sleep apnea (adult) (pediatric): Secondary | ICD-10-CM | POA: Diagnosis not present

## 2018-10-23 DIAGNOSIS — H468 Other optic neuritis: Secondary | ICD-10-CM | POA: Diagnosis not present

## 2018-10-31 DIAGNOSIS — H538 Other visual disturbances: Secondary | ICD-10-CM | POA: Diagnosis not present

## 2018-10-31 DIAGNOSIS — R110 Nausea: Secondary | ICD-10-CM | POA: Diagnosis not present

## 2018-10-31 DIAGNOSIS — I2510 Atherosclerotic heart disease of native coronary artery without angina pectoris: Secondary | ICD-10-CM | POA: Diagnosis not present

## 2018-10-31 DIAGNOSIS — Z6841 Body Mass Index (BMI) 40.0 and over, adult: Secondary | ICD-10-CM | POA: Diagnosis not present

## 2018-11-23 DIAGNOSIS — G4733 Obstructive sleep apnea (adult) (pediatric): Secondary | ICD-10-CM | POA: Diagnosis not present

## 2018-11-23 DIAGNOSIS — H468 Other optic neuritis: Secondary | ICD-10-CM | POA: Diagnosis not present

## 2018-11-28 DIAGNOSIS — G4733 Obstructive sleep apnea (adult) (pediatric): Secondary | ICD-10-CM | POA: Diagnosis not present

## 2018-12-24 DIAGNOSIS — H468 Other optic neuritis: Secondary | ICD-10-CM | POA: Diagnosis not present

## 2018-12-24 DIAGNOSIS — G4733 Obstructive sleep apnea (adult) (pediatric): Secondary | ICD-10-CM | POA: Diagnosis not present

## 2019-01-23 DIAGNOSIS — G4733 Obstructive sleep apnea (adult) (pediatric): Secondary | ICD-10-CM | POA: Diagnosis not present

## 2019-01-23 DIAGNOSIS — H468 Other optic neuritis: Secondary | ICD-10-CM | POA: Diagnosis not present

## 2019-02-07 DIAGNOSIS — G4733 Obstructive sleep apnea (adult) (pediatric): Secondary | ICD-10-CM | POA: Diagnosis not present

## 2019-02-08 DIAGNOSIS — I700 Atherosclerosis of aorta: Secondary | ICD-10-CM | POA: Diagnosis not present

## 2019-02-08 DIAGNOSIS — E559 Vitamin D deficiency, unspecified: Secondary | ICD-10-CM | POA: Diagnosis not present

## 2019-02-08 DIAGNOSIS — Z0000 Encounter for general adult medical examination without abnormal findings: Secondary | ICD-10-CM | POA: Diagnosis not present

## 2019-02-08 DIAGNOSIS — G4733 Obstructive sleep apnea (adult) (pediatric): Secondary | ICD-10-CM | POA: Diagnosis not present

## 2019-02-08 DIAGNOSIS — Z86018 Personal history of other benign neoplasm: Secondary | ICD-10-CM | POA: Diagnosis not present

## 2019-02-08 DIAGNOSIS — I2510 Atherosclerotic heart disease of native coronary artery without angina pectoris: Secondary | ICD-10-CM | POA: Diagnosis not present

## 2019-02-08 DIAGNOSIS — M8580 Other specified disorders of bone density and structure, unspecified site: Secondary | ICD-10-CM | POA: Diagnosis not present

## 2019-02-08 DIAGNOSIS — Z1231 Encounter for screening mammogram for malignant neoplasm of breast: Secondary | ICD-10-CM | POA: Diagnosis not present

## 2019-02-08 DIAGNOSIS — R29898 Other symptoms and signs involving the musculoskeletal system: Secondary | ICD-10-CM | POA: Diagnosis not present

## 2019-02-13 ENCOUNTER — Other Ambulatory Visit: Payer: Self-pay | Admitting: Family Medicine

## 2019-02-13 DIAGNOSIS — Z1231 Encounter for screening mammogram for malignant neoplasm of breast: Secondary | ICD-10-CM

## 2019-02-20 ENCOUNTER — Other Ambulatory Visit: Payer: Self-pay

## 2019-02-20 ENCOUNTER — Ambulatory Visit (INDEPENDENT_AMBULATORY_CARE_PROVIDER_SITE_OTHER)
Admission: RE | Admit: 2019-02-20 | Discharge: 2019-02-20 | Disposition: A | Discharge status: Home / Self Care | Payer: Medicare HMO | Source: Ambulatory Visit | Attending: Acute Care | Admitting: Acute Care

## 2019-02-20 DIAGNOSIS — Z87891 Personal history of nicotine dependence: Secondary | ICD-10-CM | POA: Diagnosis not present

## 2019-02-20 DIAGNOSIS — Z122 Encounter for screening for malignant neoplasm of respiratory organs: Secondary | ICD-10-CM

## 2019-02-23 DIAGNOSIS — G4733 Obstructive sleep apnea (adult) (pediatric): Secondary | ICD-10-CM | POA: Diagnosis not present

## 2019-02-23 DIAGNOSIS — H468 Other optic neuritis: Secondary | ICD-10-CM | POA: Diagnosis not present

## 2019-02-26 ENCOUNTER — Telehealth: Payer: Self-pay | Admitting: Acute Care

## 2019-02-26 DIAGNOSIS — Z122 Encounter for screening for malignant neoplasm of respiratory organs: Secondary | ICD-10-CM

## 2019-02-26 DIAGNOSIS — Z87891 Personal history of nicotine dependence: Secondary | ICD-10-CM

## 2019-02-26 NOTE — Telephone Encounter (Signed)
Call returned to patient, confirmed DOB, requesting results of CT scan. Made aware SG returns to clinic on Friday and it may take up until then to contact her with those results. Voiced understanding.   SG please advise Thanks.

## 2019-02-28 ENCOUNTER — Telehealth: Payer: Self-pay | Admitting: Acute Care

## 2019-02-28 DIAGNOSIS — Z87891 Personal history of nicotine dependence: Secondary | ICD-10-CM

## 2019-02-28 DIAGNOSIS — Z122 Encounter for screening for malignant neoplasm of respiratory organs: Secondary | ICD-10-CM

## 2019-02-28 NOTE — Telephone Encounter (Signed)
Pt informed of CT results per Sarah Groce, NP.  PT verbalized understanding.  Copy sent to PCP.  Order placed for 1 yr f/u CT.  

## 2019-03-07 DIAGNOSIS — M545 Low back pain: Secondary | ICD-10-CM | POA: Diagnosis not present

## 2019-03-07 DIAGNOSIS — G5761 Lesion of plantar nerve, right lower limb: Secondary | ICD-10-CM | POA: Diagnosis not present

## 2019-03-07 DIAGNOSIS — M1712 Unilateral primary osteoarthritis, left knee: Secondary | ICD-10-CM | POA: Diagnosis not present

## 2019-03-07 DIAGNOSIS — M25562 Pain in left knee: Secondary | ICD-10-CM | POA: Diagnosis not present

## 2019-03-25 DIAGNOSIS — H468 Other optic neuritis: Secondary | ICD-10-CM | POA: Diagnosis not present

## 2019-03-25 DIAGNOSIS — G4733 Obstructive sleep apnea (adult) (pediatric): Secondary | ICD-10-CM | POA: Diagnosis not present

## 2019-04-22 ENCOUNTER — Other Ambulatory Visit: Payer: Self-pay

## 2019-04-22 ENCOUNTER — Ambulatory Visit
Admission: RE | Admit: 2019-04-22 | Discharge: 2019-04-22 | Disposition: A | Discharge status: Home / Self Care | Payer: Medicare HMO | Source: Ambulatory Visit | Attending: Family Medicine | Admitting: Family Medicine

## 2019-04-22 DIAGNOSIS — Z1231 Encounter for screening mammogram for malignant neoplasm of breast: Secondary | ICD-10-CM | POA: Diagnosis not present

## 2019-04-25 DIAGNOSIS — H468 Other optic neuritis: Secondary | ICD-10-CM | POA: Diagnosis not present

## 2019-04-25 DIAGNOSIS — G4733 Obstructive sleep apnea (adult) (pediatric): Secondary | ICD-10-CM | POA: Diagnosis not present

## 2019-05-08 DIAGNOSIS — G4733 Obstructive sleep apnea (adult) (pediatric): Secondary | ICD-10-CM | POA: Diagnosis not present

## 2019-05-14 DIAGNOSIS — G4733 Obstructive sleep apnea (adult) (pediatric): Secondary | ICD-10-CM | POA: Diagnosis not present

## 2019-05-26 DIAGNOSIS — G4733 Obstructive sleep apnea (adult) (pediatric): Secondary | ICD-10-CM | POA: Diagnosis not present

## 2019-05-26 DIAGNOSIS — H468 Other optic neuritis: Secondary | ICD-10-CM | POA: Diagnosis not present

## 2019-06-14 DIAGNOSIS — M25562 Pain in left knee: Secondary | ICD-10-CM | POA: Diagnosis not present

## 2019-06-14 DIAGNOSIS — Z6841 Body Mass Index (BMI) 40.0 and over, adult: Secondary | ICD-10-CM | POA: Diagnosis not present

## 2019-06-19 ENCOUNTER — Telehealth (INDEPENDENT_AMBULATORY_CARE_PROVIDER_SITE_OTHER): Payer: Medicare HMO | Admitting: Cardiology

## 2019-06-19 ENCOUNTER — Encounter: Payer: Self-pay | Admitting: Cardiology

## 2019-06-19 VITALS — BP 147/78 | HR 76 | Ht 64.0 in | Wt 240.0 lb

## 2019-06-19 DIAGNOSIS — I10 Essential (primary) hypertension: Secondary | ICD-10-CM | POA: Diagnosis not present

## 2019-06-19 DIAGNOSIS — Z9889 Other specified postprocedural states: Secondary | ICD-10-CM | POA: Insufficient documentation

## 2019-06-19 DIAGNOSIS — I2510 Atherosclerotic heart disease of native coronary artery without angina pectoris: Secondary | ICD-10-CM

## 2019-06-19 NOTE — Progress Notes (Signed)
Virtual Visit via Telephone Note   This visit type was conducted due to national recommendations for restrictions regarding the COVID-19 Pandemic (e.g. social distancing) in an effort to limit this patient's exposure and mitigate transmission in our community.  Due to her co-morbid illnesses, this patient is at least at moderate risk for complications without adequate follow up.  This format is felt to be most appropriate for this patient at this time.  The patient did not have access to video technology/had technical difficulties with video requiring transitioning to audio format only (telephone).  All issues noted in this document were discussed and addressed.  No physical exam could be performed with this format.  Please refer to the patient's chart for her  consent to telehealth for Amanda Cordova.   Date:  06/19/2019   ID:  Amanda Cordova, DOB June 24, 1960, MRN [id number]  Patient Location: Home Provider Location: Home  PCP:  Kathyrn Lass, MD  Cardiologist:  Dr Stanford Breed Electrophysiologist:  None   Evaluation Performed:  Follow-Up Visit  Chief Complaint:  none  History of Present Illness:    Amanda Cordova is a 59 y.o. female who was seen by Dr. Stanford Breed in 2019 after coronary calcification was noted on a lung cancer screening CT.  The patient did have some complaints of chest discomfort and dyspnea and further work-up was undertaken.  She had a Myoview in November 2019 that was low risk and an echocardiogram in November 2019 that showed normal LV function.  Dr. Stanford Breed recommended aspirin 81 mg a day and statin therapy.  I believe the patient was supposed to follow-up with her PCP regarding statin therapy, she never did this.  The patient has hypertension.  She was on lisinopril but says she stopped this because of side effects which included nausea.  She is on nothing for hypertension now.  She tells me her blood pressure is "okay".  Today her systolic pressure was [id number] and she says  it usually runs in the 140s.  She denies any chest pain or unusual shortness of breath but she has overall fatigue.  The patient does not have symptoms concerning for COVID-19 infection (fever, chills, cough, or new shortness of breath).    Past Medical History:  Diagnosis Date  . Acoustic neuroma ([place])   . CAD (coronary artery disease)   . COPD (chronic obstructive pulmonary disease) ([place])   . Hypertension    Past Surgical History:  Procedure Laterality Date  . Brain Neuroma  2011  . BREAST EXCISIONAL BIOPSY Right 2000   benign  . cerical fusion  1995  . CHOLECYSTECTOMY    . HYSTERECTOMY ABDOMINAL WITH SALPINGECTOMY       Current Meds  Medication Sig  . ALPRAZolam (XANAX) 0.25 MG tablet Take 0.25 mg by mouth daily as needed for anxiety or sleep.   Marland Kitchen aspirin EC 81 MG tablet Take 1 tablet (81 mg total) by mouth daily.  . Multiple Vitamins-Minerals (CENTRUM SILVER PO) Take 1 tablet by mouth daily.  . Vitamin D, Ergocalciferol, (DRISDOL) 50000 units CAPS capsule Take 50,000 Units by mouth every 7 (seven) days.   . [DISCONTINUED] lisinopril (PRINIVIL,ZESTRIL) 10 MG tablet Take 10 mg by mouth daily.     Allergies:   Gabapentin and Tramadol   Social History   Tobacco Use  . Smoking status: Former Smoker    Packs/day: 1.00    Years: 35.00    Pack years: 35.00    Types: Cigarettes    Quit date: 01/09/2018  Years since quitting: 1.4  . Smokeless tobacco: Never Used  Substance Use Topics  . Alcohol use: Not Currently  . Drug use: Never     Family Hx: The patient's family history includes Breast cancer in her sister; COPD in her mother; Heart attack in her father; Heart failure in her mother.  ROS:   Please see the history of present illness.    All other systems reviewed and are negative.   Prior CV studies:   The following studies were reviewed today: Echo Nov 2019 Myoview 2019  Labs/Other Tests and Data Reviewed:    EKG:  An ECG dated 02/27/2018 was personally  reviewed today and demonstrated:  NSR-HR 75  Recent Labs: No results found for requested labs within last 8760 hours.   Recent Lipid Panel No results found for: CHOL, TRIG, HDL, CHOLHDL, LDLCALC, LDLDIRECT  Wt Readings from Last 3 Encounters:  06/19/19 240 lb (108.9 kg)  03/08/18 237 lb (107.5 kg)  02/27/18 237 lb (107.5 kg)     Objective:    Vital Signs:  BP (!) 147/78   Pulse 76   Ht 5\' 4"  (1.626 m)   Wt 240 lb (108.9 kg)   BMI 41.20 kg/m    VITAL SIGNS:  reviewed  ASSESSMENT & PLAN:    CAD- Incidental coronary ca++ on CT scan- low risk Myoview and normal echo Nov 2019  HTN- Not controlled- I asked her to monitor her B/P and f/u with her PCP.  She had problems with lisinopril.   Emphysema- Noted on CT scan  Obesity- BMI 41  Plan:  Check fasting lipids and CMET. She will follow her B/P and f/u with her PCP- goal B/P < 130/80.   COVID-19 Education: The signs and symptoms of COVID-19 were discussed with the patient and how to seek care for testing (follow up with PCP or arrange E-visit).  The importance of social distancing was discussed today.  Time:   Today, I have spent 15 minutes with the patient with telehealth technology discussing the above problems.     Medication Adjustments/Labs and Tests Ordered: Current medicines are reviewed at length with the patient today.  Concerns regarding medicines are outlined above.   Tests Ordered: No orders of the defined types were placed in this encounter.   Medication Changes: No orders of the defined types were placed in this encounter.   Follow Up:  In Person Dr Stanford Breed in 6 months  Signed, Kerin Ransom, Hershal Coria  06/19/2019 11:36 AM    [place]

## 2019-06-19 NOTE — Patient Instructions (Signed)
Medication Instructions:  Your physician recommends that you continue on your current medications as directed. Please refer to the Current Medication list given to you today.  *If you need a refill on your cardiac medications before your next appointment, please call your pharmacy*   Lab Work: FASTING LABS (Lipid, CMET) in 1-2 weeks  If you have labs (blood work) drawn today and your tests are completely normal, you will receive your results only by: Marland Kitchen MyChart Message (if you have MyChart) OR . A paper copy in the mail If you have any lab test that is abnormal or we need to change your treatment, we will call you to review the results.   Testing/Procedures: NONE  Follow-Up: At [facility], you and your health needs are our priority.  As part of our continuing mission to provide you with exceptional heart care, we have created designated Provider Care Teams.  These Care Teams include your primary Cardiologist (physician) and Advanced Practice Providers (APPs -  Physician Assistants and Nurse Practitioners) who all work together to provide you with the care you need, when you need it.  We recommend signing up for the patient portal called "MyChart".  Sign up information is provided on this After Visit Summary.  MyChart is used to connect with patients for Virtual Visits (Telemedicine).  Patients are able to view lab/test results, encounter notes, upcoming appointments, etc.  Non-urgent messages can be sent to your provider as well.   To learn more about what you can do with MyChart, go to [email or web address].    Your next appointment:   6 month(s)  The format for your next appointment:   In Person  Provider:   Kirk Ruths, MD   Other Instructions Check your blood pressure 3 times weekly (write it down to keep a log) until you have a visit with your primary care doctor.   If you notice it consistently >130/80, please call our office.

## 2019-06-20 DIAGNOSIS — G4733 Obstructive sleep apnea (adult) (pediatric): Secondary | ICD-10-CM | POA: Diagnosis not present

## 2019-06-23 DIAGNOSIS — H468 Other optic neuritis: Secondary | ICD-10-CM | POA: Diagnosis not present

## 2019-06-23 DIAGNOSIS — G4733 Obstructive sleep apnea (adult) (pediatric): Secondary | ICD-10-CM | POA: Diagnosis not present

## 2019-07-01 DIAGNOSIS — I10 Essential (primary) hypertension: Secondary | ICD-10-CM | POA: Diagnosis not present

## 2019-07-01 DIAGNOSIS — I2510 Atherosclerotic heart disease of native coronary artery without angina pectoris: Secondary | ICD-10-CM | POA: Diagnosis not present

## 2019-07-02 LAB — COMPREHENSIVE METABOLIC PANEL
ALT: 13 IU/L (ref 0–32)
AST: 19 IU/L (ref 0–40)
Albumin/Globulin Ratio: 1.7 (ref 1.2–2.2)
Albumin: 4 g/dL (ref 3.8–4.9)
Alkaline Phosphatase: 106 IU/L (ref 39–117)
BUN/Creatinine Ratio: 19 (ref 12–28)
BUN: 13 mg/dL (ref 8–27)
Bilirubin Total: 0.5 mg/dL (ref 0.0–1.2)
CO2: 23 mmol/L (ref 20–29)
Calcium: 9.5 mg/dL (ref 8.7–10.3)
Chloride: 110 mmol/L — ABNORMAL HIGH (ref 96–106)
Creatinine, Ser: 0.68 mg/dL (ref 0.57–1.00)
GFR calc Af Amer: 110 mL/min/{1.73_m2} (ref >59)
GFR calc non Af Amer: 95 mL/min/{1.73_m2} (ref >59)
Globulin, Total: 2.4 g/dL (ref 1.5–4.5)
Glucose: 97 mg/dL (ref 65–99)
Potassium: 4.3 mmol/L (ref 3.5–5.2)
Sodium: 148 mmol/L — ABNORMAL HIGH (ref 134–144)
Total Protein: 6.4 g/dL (ref 6.0–8.5)

## 2019-07-02 LAB — LIPID PANEL
Chol/HDL Ratio: 4.2 ratio (ref 0.0–4.4)
Cholesterol, Total: 142 mg/dL (ref 100–199)
HDL: 34 mg/dL — ABNORMAL LOW (ref >39)
LDL Chol Calc (NIH): 88 mg/dL (ref 0–99)
Triglycerides: 110 mg/dL (ref 0–149)
VLDL Cholesterol Cal: 20 mg/dL (ref 5–40)

## 2019-07-09 ENCOUNTER — Encounter: Payer: Self-pay | Admitting: Cardiology

## 2019-07-09 ENCOUNTER — Telehealth (INDEPENDENT_AMBULATORY_CARE_PROVIDER_SITE_OTHER): Payer: Medicare HMO | Admitting: Cardiology

## 2019-07-09 VITALS — BP 139/76 | HR 63 | Ht 64.0 in

## 2019-07-09 DIAGNOSIS — J432 Centrilobular emphysema: Secondary | ICD-10-CM | POA: Diagnosis not present

## 2019-07-09 DIAGNOSIS — J439 Emphysema, unspecified: Secondary | ICD-10-CM | POA: Insufficient documentation

## 2019-07-09 DIAGNOSIS — I10 Essential (primary) hypertension: Secondary | ICD-10-CM

## 2019-07-09 DIAGNOSIS — I2510 Atherosclerotic heart disease of native coronary artery without angina pectoris: Secondary | ICD-10-CM

## 2019-07-09 DIAGNOSIS — M25562 Pain in left knee: Secondary | ICD-10-CM | POA: Diagnosis not present

## 2019-07-09 NOTE — Progress Notes (Signed)
Virtual Visit via Video Note   This visit type was conducted due to national recommendations for restrictions regarding the COVID-19 Pandemic (e.g. social distancing) in an effort to limit this patient's exposure and mitigate transmission in our community.  Due to her co-morbid illnesses, this patient is at least at moderate risk for complications without adequate follow up.  This format is felt to be most appropriate for this patient at this time.  All issues noted in this document were discussed and addressed.  A limited physical exam was performed with this format.  Please refer to the patient's chart for her consent to telehealth for [facility].   The patient was identified using 2 identifiers.  Date:  07/09/2019   ID:  Amanda Cordova, DOB 10/25/1960, MRN [id number]  Patient Location: Home Provider Location: Home  PCP:  Amanda Lass, MD  Cardiologist:  Amanda Ruths, MD  Electrophysiologist:  None   Evaluation Performed:  Follow-Up Visit  Chief Complaint:  None-  History of Present Illness:    Amanda Cordova is a 59 y.o. female with Amanda Cordova is a 59 y.o. female with a hx of coronary calcification noted on a lung cancer screening CT in 2019.  The patient did have some complaints of chest discomfort and dyspnea at that time and further work-up was undertaken.  She had a Myoview in November 2019 that was low risk and an echocardiogram in November 2019 that showed normal LV function.  Dr. Stanford Cordova recommended aspirin 81 mg a day and statin therapy. A f/u chest CT in Nov 2020 did not mention coronary Ca++ but did mention aortic atherosclerosis.   The patient has hypertension.  She was on lisinopril but says she stopped this because of side effects which included nausea.  She is on nothing for hypertension now. Her B/P at home is running [id number] systolic. Her lipid panel showed an LDL of 88. She denies any exertional chest pain or SOB. She is supposed to f/u with her PCP  who has been adjusting her medications .  The patient does not have symptoms concerning for COVID-19 infection (fever, chills, cough, or new shortness of breath).    Past Medical History:  Diagnosis Date  . Acoustic neuroma ([place])   . CAD (coronary artery disease)   . COPD (chronic obstructive pulmonary disease) ([place])   . Hypertension    Past Surgical History:  Procedure Laterality Date  . BREAST EXCISIONAL BIOPSY Right 2000   benign  . cerical fusion  1995  . CHOLECYSTECTOMY    . 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 HYSTERECTOMY ABDOMINAL WITH SALPINGECTOMY       Current Meds  Medication Sig  . ALPRAZolam (XANAX) 0.25 MG tablet Take 0.25 mg by mouth daily as needed for anxiety or sleep.   Marland Kitchen aspirin EC 81 MG tablet Take 1 tablet (81 mg total) by mouth daily.  . Multiple Vitamins-Minerals (CENTRUM SILVER PO) Take 1 tablet by mouth daily.  . Vitamin D, Ergocalciferol, (DRISDOL) 50000 units CAPS capsule Take 50,000 Units by mouth every 7 (seven) days.      Allergies:   Gabapentin, Tramadol, and Lisinopril   Social History   Tobacco Use  . Smoking status: Former Smoker    Packs/day: 1.00    Years: 35.00    Pack years: 35.00    Types: Cigarettes    Quit date: 01/09/2018    Years since quitting: 1.4  . Smokeless tobacco: Never Used  Substance Use Topics  .  Alcohol use: Not Currently  . Drug use: Never     Family Hx: The patient's family history includes Breast cancer in her sister; COPD in her mother; Heart attack in her father; Heart failure in her mother.  ROS:   Please see the history of present illness.    All other systems reviewed and are negative.   Prior CV studies:   The following studies were reviewed today: Chest CT 2020  Labs/Other Tests and Data Reviewed:    EKG:  An ECG dated 02/27/2018 was personally reviewed today and demonstrated:  NSR-75  Recent Labs: 07/01/2019: ALT 13; BUN 13; Creatinine, Ser 0.68; Potassium 4.3; Sodium 148    Recent Lipid Panel Lab Results  Component Value Date/Time   CHOL 142 07/01/2019 08:44 AM   TRIG 110 07/01/2019 08:44 AM   HDL 34 (L) 07/01/2019 08:44 AM   CHOLHDL 4.2 07/01/2019 08:44 AM   LDLCALC 88 07/01/2019 08:44 AM    Wt Readings from Last 3 Encounters:  06/19/19 240 lb (108.9 kg)  03/08/18 237 lb (107.5 kg)  02/27/18 237 lb (107.5 kg)     Objective:    Vital Signs:  BP 139/76   Pulse 63   Ht 5\' 4"  (1.626 m)   BMI 41.20 kg/m    VITAL SIGNS:  reviewed  ASSESSMENT & PLAN:    CAD- Incidental coronary ca++ on CT scan in 2019 (not mentioned on CT Nov 2020) - low risk Myoview and normal echo Nov 2019. No symptoms c/w angina.  Dyslipidemia LDL slightly above goal of 70- recommend diet, exercise, weight loss and re evaluate lipids in 6 months.   HTN- Not controlled- goal B/P 130/80 or less.  I asked her to monitor her B/P and f/u with her PCP.  She had problems with lisinopril.   Emphysema- Noted on CT scan-former smoker  Obesity- BMI 41  COVID-19 Education: The signs and symptoms of COVID-19 were discussed with the patient and how to seek care for testing (follow up with PCP or arrange E-visit).  The importance of social distancing was discussed today.  Time:   Today, I have spent 20 minutes with the patient with telehealth technology discussing the above problems.     Medication Adjustments/Labs and Tests Ordered: Current medicines are reviewed at length with the patient today.  Concerns regarding medicines are outlined above.   Tests Ordered: No orders of the defined types were placed in this encounter.   Medication Changes: No orders of the defined types were placed in this encounter.   Follow Up:  In Person Dr Amanda Cordova in one year.   Amanda Form, PA-C  07/09/2019 8:23 AM    [facility]

## 2019-07-09 NOTE — Addendum Note (Signed)
Addended by: Therisa Doyne on: 07/09/2019 12:28 PM   Modules accepted: Orders

## 2019-07-09 NOTE — Patient Instructions (Signed)
Medication Instructions:  Your physician recommends that you continue on your current medications as directed. Please refer to the Current Medication list given to you today.  *If you need a refill on your cardiac medications before your next appointment, please call your pharmacy*   Lab Work: Your physician recommends that you return for a FASTING lipid profile in 6 months (September).  If you have labs (blood work) drawn today and your tests are completely normal, you will receive your results only by: Marland Kitchen MyChart Message (if you have MyChart) OR . A paper copy in the mail If you have any lab test that is abnormal or we need to change your treatment, we will call you to review the results.   Follow-Up: At [facility], you and your health needs are our priority.  As part of our continuing mission to provide you with exceptional heart care, we have created designated Provider Care Teams.  These Care Teams include your primary Cardiologist (physician) and Advanced Practice Providers (APPs -  Physician Assistants and Nurse Practitioners) who all work together to provide you with the care you need, when you need it.  We recommend signing up for the patient portal called "MyChart".  Sign up information is provided on this After Visit Summary.  MyChart is used to connect with patients for Virtual Visits (Telemedicine).  Patients are able to view lab/test results, encounter notes, upcoming appointments, etc.  Non-urgent messages can be sent to your provider as well.   To learn more about what you can do with MyChart, go to [email or web address].    Your next appointment:   12 month(s)  The format for your next appointment:   In Person  Provider:   Kirk Ruths, MD   Other Instructions Please call 2 months in advance to schedule your 12 month follow-up appointment.  Please follow-up with your PCP for your blood pressure. Call us if you are not able to follow-up with her soon.  START  a low cholesterol diet and exercise.   Fat and Cholesterol Restricted Eating Plan Getting too much fat and cholesterol in your diet may cause health problems. Choosing the right foods helps keep your fat and cholesterol at normal levels. This can keep you from getting certain diseases. Your doctor may recommend an eating plan that includes:  Total fat: ______% or less of total calories a day.  Saturated fat: ______% or less of total calories a day.  Cholesterol: less than _________mg a day.  Fiber: ______g a day. What are tips for following this plan? Meal planning  At meals, divide your plate into four equal parts: ? Fill one-half of your plate with vegetables and green salads. ? Fill one-fourth of your plate with whole grains. ? Fill one-fourth of your plate with low-fat (lean) protein foods.  Eat fish that is high in omega-3 fats at least two times a week. This includes mackerel, tuna, sardines, and salmon.  Eat foods that are high in fiber, such as whole grains, beans, apples, broccoli, carrots, peas, and barley. General tips   Work with your doctor to lose weight if you need to.  Avoid: ? Foods with added sugar. ? Fried foods. ? Foods with partially hydrogenated oils.  Limit alcohol intake to no more than 1 drink a day for nonpregnant women and 2 drinks a day for men. One drink equals 12 oz of beer, 5 oz of wine, or 1 oz of hard liquor. Reading food labels  Check food labels for: ?  Trans fats. ? Partially hydrogenated oils. ? Saturated fat (g) in each serving. ? Cholesterol (mg) in each serving. ? Fiber (g) in each serving.  Choose foods with healthy fats, such as: ? Monounsaturated fats. ? Polyunsaturated fats. ? Omega-3 fats.  Choose grain products that have whole grains. Look for the word "whole" as the first word in the ingredient list. Cooking  Cook foods using low-fat methods. These include baking, boiling, grilling, and broiling.  Eat more  home-cooked foods. Eat at restaurants and buffets less often.  Avoid cooking using saturated fats, such as butter, cream, palm oil, palm kernel oil, and coconut oil. Recommended foods  Fruits  All fresh, canned (in natural juice), or frozen fruits. Vegetables  Fresh or frozen vegetables (raw, steamed, roasted, or grilled). Green salads. Grains  Whole grains, such as whole wheat or whole grain breads, crackers, cereals, and pasta. Unsweetened oatmeal, bulgur, barley, quinoa, or brown rice. Corn or whole wheat flour tortillas. Meats and other protein foods  Ground beef (85% or leaner), grass-fed beef, or beef trimmed of fat. Skinless chicken or [place]. Ground chicken or [place]. Pork trimmed of fat. All fish and seafood. Egg whites. Dried beans, peas, or lentils. Unsalted nuts or seeds. Unsalted canned beans. Nut butters without added sugar or oil. Dairy  Low-fat or nonfat dairy products, such as skim or 1% milk, 2% or reduced-fat cheeses, low-fat and fat-free ricotta or cottage cheese, or plain low-fat and nonfat yogurt. Fats and oils  Tub margarine without trans fats. Light or reduced-fat mayonnaise and salad dressings. Avocado. Olive, canola, sesame, or safflower oils. The items listed above may not be a complete list of foods and beverages you can eat. Contact a dietitian for more information. Foods to avoid Fruits  Canned fruit in heavy syrup. Fruit in cream or butter sauce. Fried fruit. Vegetables  Vegetables cooked in cheese, cream, or butter sauce. Fried vegetables. Grains  White bread. White pasta. White rice. Cornbread. Bagels, pastries, and croissants. Crackers and snack foods that contain trans fat and hydrogenated oils. Meats and other protein foods  Fatty cuts of meat. Ribs, chicken wings, bacon, sausage, bologna, salami, chitterlings, fatback, hot dogs, bratwurst, and packaged lunch meats. Liver and organ meats. Whole eggs and egg yolks. Chicken and [place] with skin.  Fried meat. Dairy  Whole or 2% milk, cream, half-and-half, and cream cheese. Whole milk cheeses. Whole-fat or sweetened yogurt. Full-fat cheeses. Nondairy creamers and whipped toppings. Processed cheese, cheese spreads, and cheese curds. Beverages  Alcohol. Sugar-sweetened drinks such as sodas, lemonade, and fruit drinks. Fats and oils  Butter, stick margarine, lard, shortening, ghee, or bacon fat. Coconut, palm kernel, and palm oils. Sweets and desserts  Corn syrup, sugars, honey, and molasses. Candy. Jam and jelly. Syrup. Sweetened cereals. Cookies, pies, cakes, donuts, muffins, and ice cream. The items listed above may not be a complete list of foods and beverages you should avoid. Contact a dietitian for more information. Summary  Choosing the right foods helps keep your fat and cholesterol at normal levels. This can keep you from getting certain diseases.  At meals, fill one-half of your plate with vegetables and green salads.  Eat high-fiber foods, like whole grains, beans, apples, carrots, peas, and barley.  Limit added sugar, saturated fats, alcohol, and fried foods. This information is not intended to replace advice given to you by your health care provider. Make sure you discuss any questions you have with your health care provider. Document Revised: 12/06/2017 Document Reviewed: 12/20/2016 Elsevier Patient  Education  [employer].

## 2019-07-11 ENCOUNTER — Telehealth: Payer: Self-pay

## 2019-07-11 NOTE — Telephone Encounter (Signed)
   [facility] Pre-operative Risk Assessment    Request for surgical clearance:  1. What type of surgery is being performed? Left Knee Arthroscopy    2. When is this surgery scheduled? 07/17/19   3. What type of clearance is required (medical clearance vs. Pharmacy clearance to hold med vs. Both)? Both  4. Are there any medications that need to be held prior to surgery and how long?ASA   5. Practice name and name of physician performing surgery? [facility]   6. What is your office phone number [phone]    7.   What is your office fax number [phone] ATTN: Marcelo Baldy  8.   Anesthesia type (None, local, MAC, general) ? Choice   Meryl Crutch 07/11/2019, 8:32 AM  _________________________________________________________________   (provider comments below)

## 2019-07-11 NOTE — Telephone Encounter (Signed)
   Primary Cardiologist: Kirk Ruths, MD  Chart reviewed as part of pre-operative protocol coverage.  Amanda Cordova was last seen on 07/09/2019 by Kerin Ransom, PA.  She has a history of incidental finding of coronary calcium found on CT in 2019. She had normal myoview and echo in 12/2017. No anginal symptoms.   It is ok to hold aspirin as needed for procedure   Therefore, based on ACC/AHA guidelines, the patient would be at acceptable risk for the planned procedure without further cardiovascular testing.   I will route this recommendation to the requesting party via Epic fax function and remove from pre-op pool.  Please call with questions.  Daune Perch, NP 07/11/2019, 9:50 AM

## 2019-07-24 DIAGNOSIS — G4733 Obstructive sleep apnea (adult) (pediatric): Secondary | ICD-10-CM | POA: Diagnosis not present

## 2019-07-24 DIAGNOSIS — H468 Other optic neuritis: Secondary | ICD-10-CM | POA: Diagnosis not present

## 2019-07-30 DIAGNOSIS — M25562 Pain in left knee: Secondary | ICD-10-CM | POA: Diagnosis not present

## 2019-08-05 DIAGNOSIS — L309 Dermatitis, unspecified: Secondary | ICD-10-CM | POA: Diagnosis not present

## 2019-08-05 DIAGNOSIS — I10 Essential (primary) hypertension: Secondary | ICD-10-CM | POA: Diagnosis not present

## 2019-08-05 DIAGNOSIS — R7303 Prediabetes: Secondary | ICD-10-CM | POA: Diagnosis not present

## 2019-08-05 DIAGNOSIS — E559 Vitamin D deficiency, unspecified: Secondary | ICD-10-CM | POA: Diagnosis not present

## 2019-08-08 DIAGNOSIS — S83242A Other tear of medial meniscus, current injury, left knee, initial encounter: Secondary | ICD-10-CM | POA: Diagnosis not present

## 2019-08-21 DIAGNOSIS — M94262 Chondromalacia, left knee: Secondary | ICD-10-CM | POA: Diagnosis not present

## 2019-08-21 DIAGNOSIS — M23222 Derangement of posterior horn of medial meniscus due to old tear or injury, left knee: Secondary | ICD-10-CM | POA: Diagnosis not present

## 2019-09-17 DIAGNOSIS — S83242D Other tear of medial meniscus, current injury, left knee, subsequent encounter: Secondary | ICD-10-CM | POA: Diagnosis not present

## 2019-11-11 IMAGING — MR MR ORBITS WO/W CM
11 of 18 series · 28 of 48 positions shown · IV contrast (gadavist)
Comparison: RIGHT pelvic radiograph January 09, 2018 at 7323
hours.

CLINICAL DATA: Progressive LEFT blurriness and eye pain for months.
LEFT-sided headache. History of hypertension and LEFT acoustic
neuroma.

EXAM:
MRI HEAD AND ORBITS WITHOUT AND WITH CONTRAST
TECHNIQUE: Multiplanar, multiecho pulse sequences of the brain and surrounding
structures were obtained without and with intravenous contrast.
Multiplanar, multiecho pulse sequences of the orbits and surrounding
structures were obtained including fat saturation techniques, before
and after intravenous contrast administration.
CONTRAST:  10 cc Gadavist

[Series 3: DWI · axial · 3.0mm · 0.94mm/px · z∈[-7,+124]mm · 5 of 90 slices shown (1 of 2)]
[im 1/90]
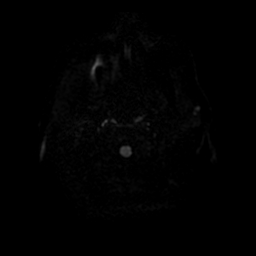
[im 23/90]
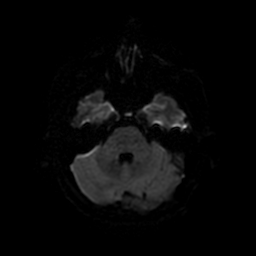
[im 45/90]
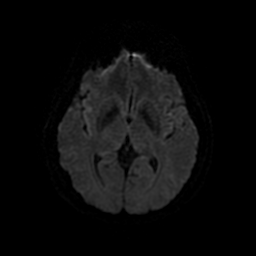
[im 67/90]
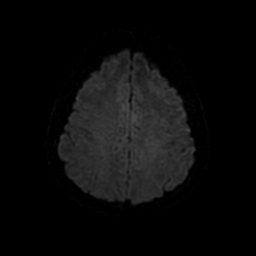
[im 90/90]
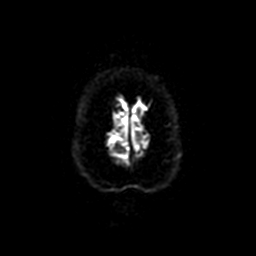

[Series 4: FLAIR · axial · 3.0mm · 0.47mm/px · 1 of 23 slices shown (1 of 2)]
[im 1/23]
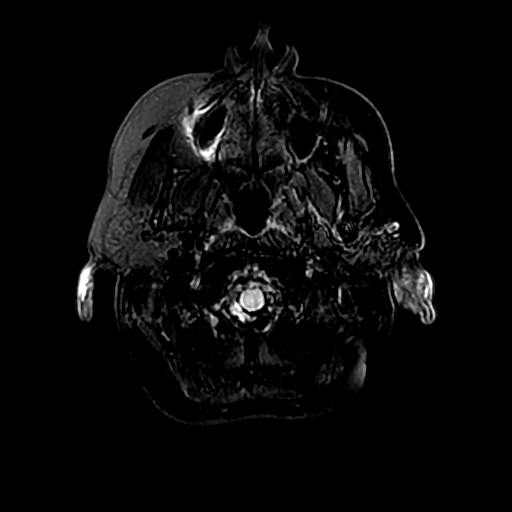

[Series 5: FLAIR · sagittal · 5.0mm · 0.47mm/px · 2 of 23 slices shown (2 of 2)]
[im 1/23]
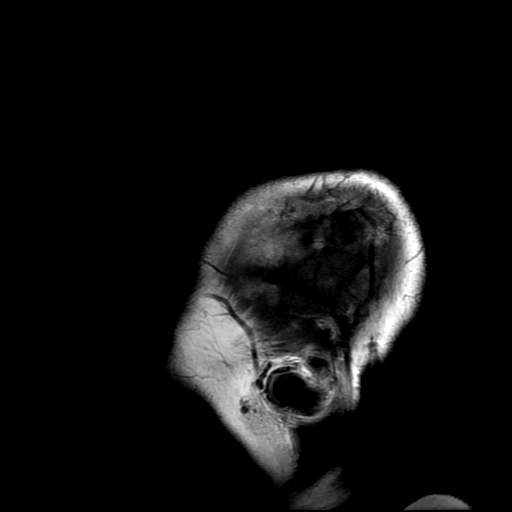
[im 23/23]
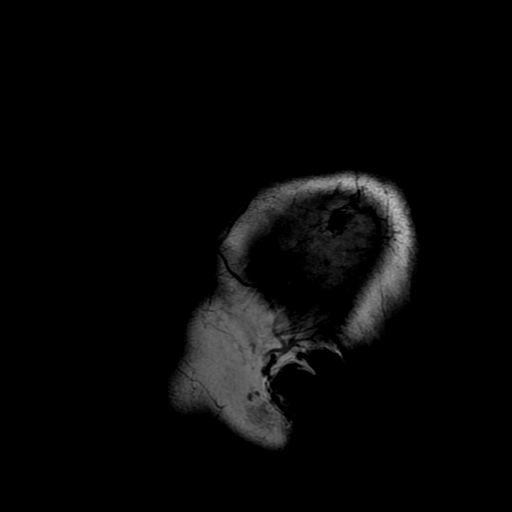

[Series 6: T2 · axial · 5.0mm · 0.47mm/px · z∈[-7,+124]mm · 2 of 23 slices shown (1 of 2)]
[im 1/23]
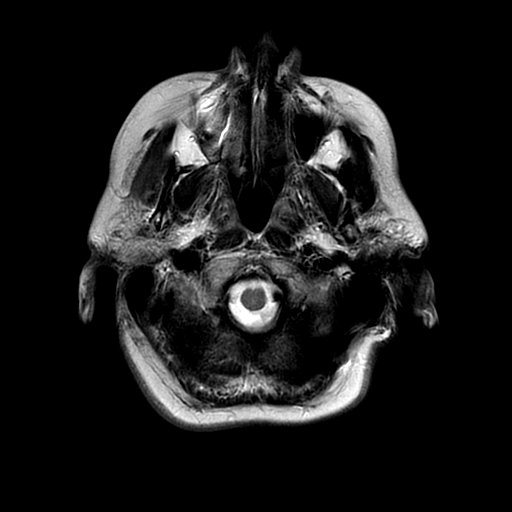
[im 23/23]
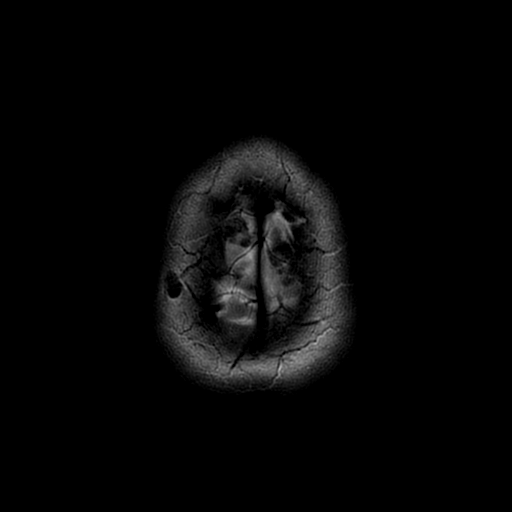

[Series 7: DWI · coronal · 4.0mm · 0.94mm/px · 5 of 68 slices shown (2 of 2)]
[im 1/68]
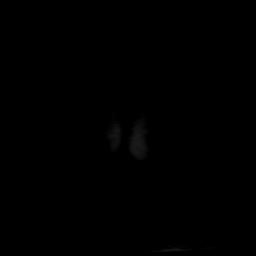
[im 17/68]
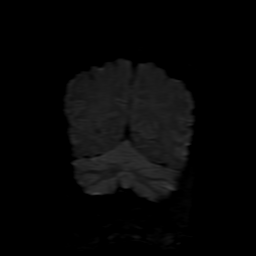
[im 34/68]
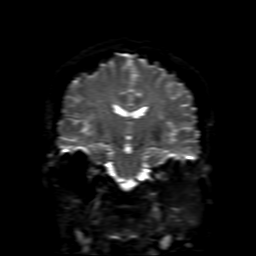
[im 51/68]
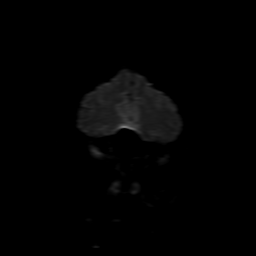
[im 68/68]
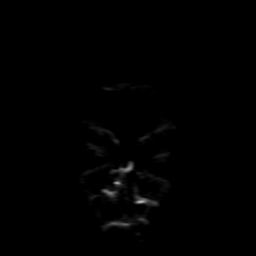

[Series 12: T2 · coronal · 5.0mm · 0.39mm/px · 2 of 28 slices shown (2 of 2)]
[im 1/28]
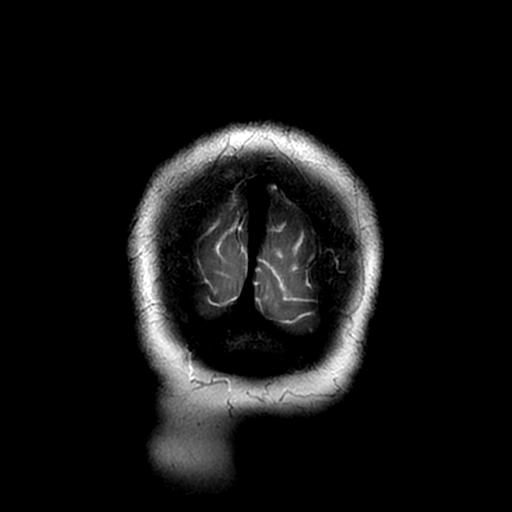
[im 28/28]
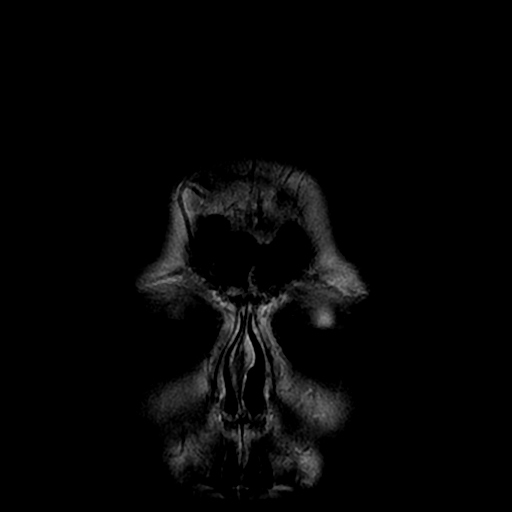

[Series 13: T1 · coronal · 4.0mm · 0.35mm/px · 2 of 26 slices shown (1 of 2)]
[im 1/26]
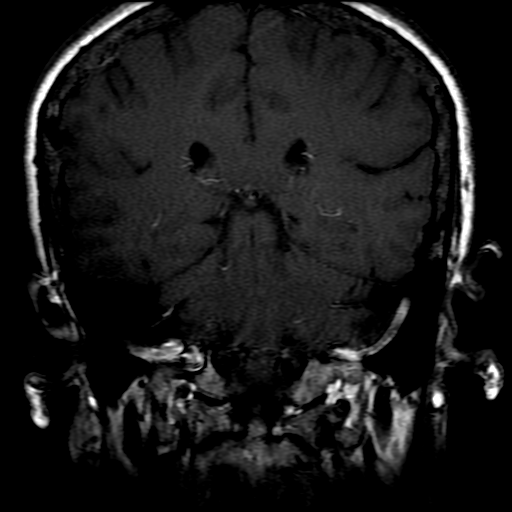
[im 26/26]
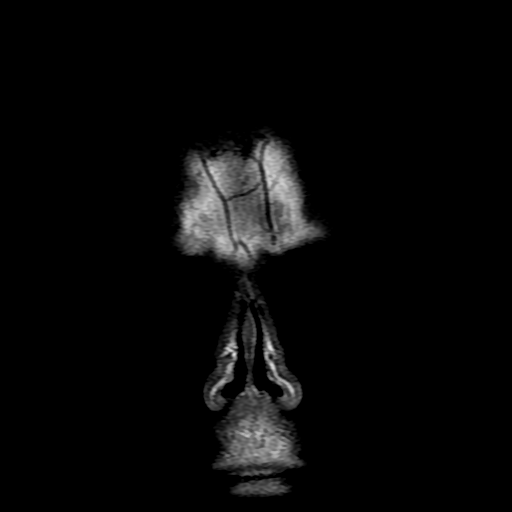

[Series 14: T1 · axial · 3.0mm · 0.35mm/px · z∈[+12,+74]mm · 2 of 22 slices shown (2 of 2)]
[im 1/22]
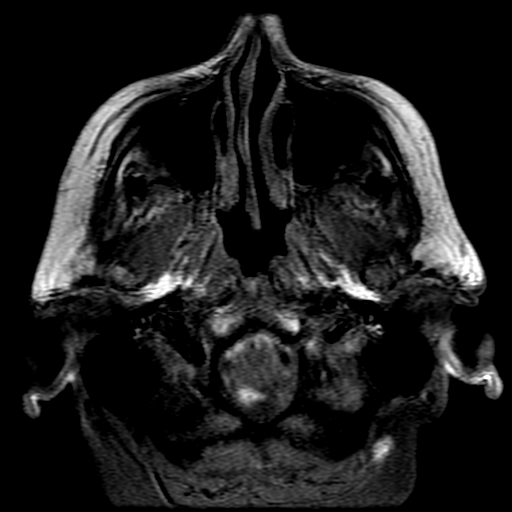
[im 22/22]
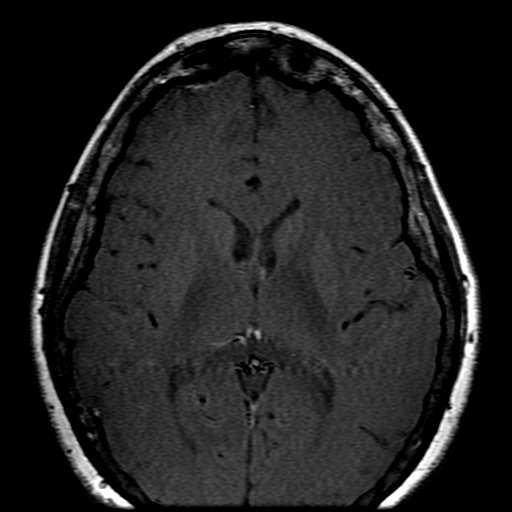

[Series 15: T1 fat-sat · coronal · 4.0mm · 0.35mm/px · 2 of 26 slices shown]
[im 1/26]
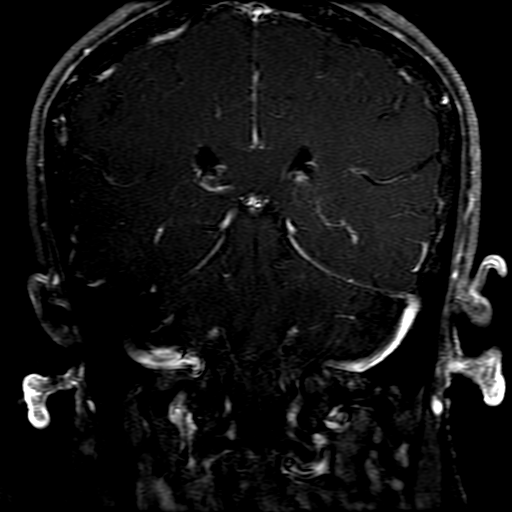
[im 26/26]
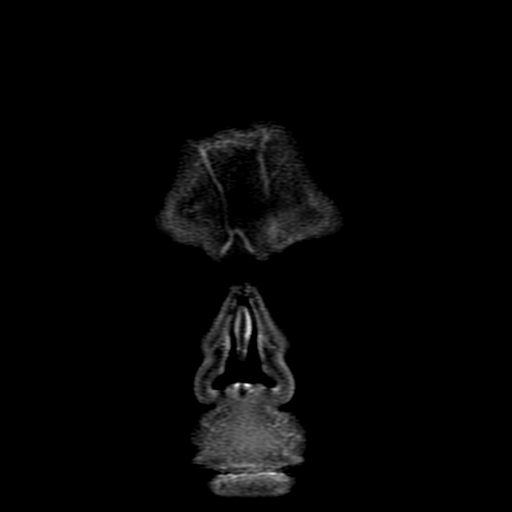

[Series 350: ADC · axial · 3.0mm · 0.94mm/px · z∈[-7,+124]mm · 3 of 45 slices shown (1 of 2)]
[im 1/45]
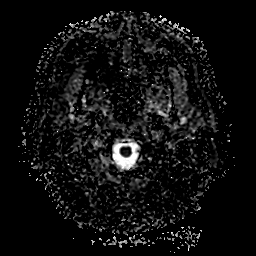
[im 23/45]
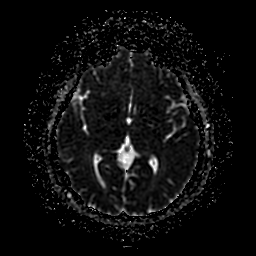
[im 45/45]
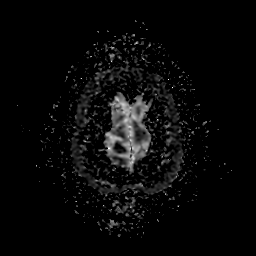

[Series 750: ADC · coronal · 4.0mm · 0.94mm/px · 2 of 34 slices shown (2 of 2)]
[im 1/34]
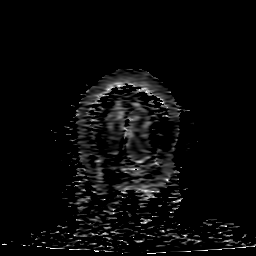
[im 34/34]
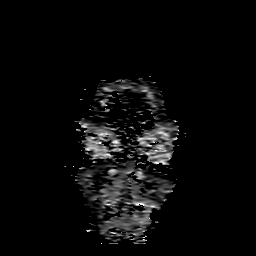

[28 of 48 positions shown; findings below may reference images not displayed]

FINDINGS: MRI HEAD FINDINGS

INTRACRANIAL CONTENTS: No reduced diffusion to suggest acute
ischemia. No susceptibility artifact to suggest hemorrhage. Small
area LEFT cerebellar insist postoperative encephalomalacia.
Scattered supratentorial subcentimeter white matter FLAIR T2
hyperintensities in a nonspecific distribution. The ventricles and
sulci are normal for patient's age. No suspicious parenchymal
signal, masses, mass effect. No abnormal intraparenchymal or
extra-axial enhancement. No abnormal extra-axial fluid collections.
No extra-axial masses.

VASCULAR: Normal major intracranial vascular flow voids present at
skull base.

SKULL AND UPPER CERVICAL SPINE: No abnormal sellar expansion. Status
post LEFT retrosigmoid craniotomy. No suspicious calvarial bone
marrow signal. Craniocervical junction maintained.

OTHER: None.

MRI ORBITS FINDINGS-moderately motion degraded examination.

ORBITS: Ocular globes are intact with normal signal. Lenses are
located. Preservation of the orbital fat. Normal appearance of the
optic nerve sheath complexes without abnormal optic nerve. Faint
LEFT optic nerve sheath enhancement versus motion artifact. Normal
symmetric appearance of the extraocular muscles. No intra-ocular
mass, signal abnormality nor abnormal enhancement. Superior
ophthalmic veins are not enlarged.

VISUALIZED SINUSES: Well-aerated.

SOFT TISSUES: Normal.
IMPRESSION: MRI HEAD:

1. No acute intracranial process.
2. Status post LEFT retrosigmoid craniotomy, no residual or
recurrent tumor though not tailored for evaluation. Postoperative
LEFT cerebellar encephalomalacia.
3. Mild-to-moderate chronic small vessel ischemic changes, atypical
distribution for classic demyelination.

MRA HEAD:

1. Moderately motion degraded examination.
2. Faint LEFT optic perineuritis versus motion artifact.

## 2019-11-13 ENCOUNTER — Encounter: Payer: Self-pay | Admitting: Family Medicine

## 2019-11-13 ENCOUNTER — Ambulatory Visit (INDEPENDENT_AMBULATORY_CARE_PROVIDER_SITE_OTHER): Payer: Medicare HMO | Admitting: Family Medicine

## 2019-11-13 ENCOUNTER — Other Ambulatory Visit: Payer: Self-pay

## 2019-11-13 VITALS — BP 110/70 | HR 70 | Temp 98.1°F | Resp 12 | Ht 64.0 in | Wt 234.0 lb

## 2019-11-13 DIAGNOSIS — M791 Myalgia, unspecified site: Secondary | ICD-10-CM

## 2019-11-13 DIAGNOSIS — I10 Essential (primary) hypertension: Secondary | ICD-10-CM

## 2019-11-13 DIAGNOSIS — I7300 Raynaud's syndrome without gangrene: Secondary | ICD-10-CM | POA: Diagnosis not present

## 2019-11-13 DIAGNOSIS — J432 Centrilobular emphysema: Secondary | ICD-10-CM

## 2019-11-13 DIAGNOSIS — T466X5A Adverse effect of antihyperlipidemic and antiarteriosclerotic drugs, initial encounter: Secondary | ICD-10-CM

## 2019-11-13 DIAGNOSIS — R208 Other disturbances of skin sensation: Secondary | ICD-10-CM | POA: Diagnosis not present

## 2019-11-13 DIAGNOSIS — E559 Vitamin D deficiency, unspecified: Secondary | ICD-10-CM | POA: Diagnosis not present

## 2019-11-13 DIAGNOSIS — G5760 Lesion of plantar nerve, unspecified lower limb: Secondary | ICD-10-CM

## 2019-11-13 MED ORDER — AMLODIPINE BESYLATE 5 MG PO TABS: 2.5000 mg | ORAL_TABLET | Freq: Every day | ORAL | 2 refills | Status: DC

## 2019-11-13 MED ORDER — ALBUTEROL SULFATE HFA 108 (90 BASE) MCG/ACT IN AERS: 2.0000 | INHALATION_SPRAY | Freq: Four times a day (QID) | RESPIRATORY_TRACT | 1 refills | Status: DC | PRN

## 2019-11-13 NOTE — Progress Notes (Signed)
HPI: [name] is a 59 y.o. female, who is here today to establish care.  Former PCP: Dr Amanda Cordova Last preventive routine visit:02/08/19.  Chronic medical problems: COPD,anxiety,HTN,vit D deficiency, acoustic neuroma s/p craniotomy ,CAD,and DDD. Chronic tinnitus and residual balance issues, Alprazolam helps. Follows with ENT.  COPD: Former smoker. Wheezing and SOB with exertion. No cough. She is not on treatment.  Concerns today:  -Requesting referral to podiatrist, history of Morton neuroma, still having plantar pain. She is also complaining about burning sensation in toes, this has been going on for a while now, slowly getting worse.  Pain "all over": Problem has been going on for years. No hx of fibromyalgia. Constant. Exacerbated by movement. + Neck pain, no radiated. Negative for associated fever,chill,abnormal wt loss,night sweats,oral lesions,or skin rash.  Cold/blue toes:Problem has been going on for years. Exacerbated by cold exposure. + Pain. No ulcers,occasionally toes turn pale.  ION:GEXBMWU with cardiologist. Statin exacerbated myalgias. HTN on Losartan 25 mg daily. Negative for severe/frequent headache, visual changes, chest pain, palpitation, claudication, focal weakness, or edema.  Lab Results  Component Value Date   CHOL 142 07/01/2019   HDL 34 (L) 07/01/2019   LDLCALC 88 07/01/2019   TRIG 110 07/01/2019   CHOLHDL 4.2 07/01/2019   Vit D deficiency: She is on Ergocalciferol 50,000 U weekly.  She exercises regularly and tries to follow a healthful diet.  Review of Systems  Constitutional: Positive for fatigue. Negative for activity change and appetite change.  HENT: Negative for nosebleeds and sore throat.   Eyes: Negative for redness.  Respiratory: Negative for cough and wheezing.   Gastrointestinal: Negative for abdominal pain, nausea and vomiting.       Negative for changes in bowel habits.  Genitourinary: Negative for  decreased urine volume and hematuria.  Neurological: Negative for syncope, facial asymmetry, weakness and numbness.  Psychiatric/Behavioral: Positive for sleep disturbance. Negative for confusion.  Rest see pertinent positives and negatives per HPI.  Current Outpatient Medications on File Prior to Visit  Medication Sig Dispense Refill  . ALPRAZolam (XANAX) 0.25 MG tablet Take 0.25 mg by mouth daily as needed for anxiety or sleep.     Marland Kitchen aspirin EC 81 MG tablet Take 1 tablet (81 mg total) by mouth daily.    . Multiple Vitamins-Minerals (CENTRUM SILVER PO) Take 1 tablet by mouth daily.    . Vitamin D, Ergocalciferol, (DRISDOL) 50000 units CAPS capsule Take 50,000 Units by mouth every 7 (seven) days.      No current facility-administered medications on file prior to visit.   Past Medical History:  Diagnosis Date  . Acoustic neuroma ([place])   . CAD (coronary artery disease)   . COPD (chronic obstructive pulmonary disease) ([place])   . Hypertension    Allergies  Allergen Reactions  . Gabapentin Anaphylaxis and Swelling    Throat and tongue swells Throat and tongue swells  . Tramadol Swelling    Throat and hands  . Lisinopril Nausea Only    Family History  Problem Relation Age of Onset  . Breast cancer Sister   . COPD Mother   . Heart failure Mother   . Heart attack Father     Social History   Socioeconomic History  . Marital status: Married    Spouse name: Not on file  . Number of children: 4  . Years of education: Not on file  . Highest education level: Not on file  Occupational History  . Not on file  Tobacco Use  . Smoking status: Former Smoker    Packs/day: 1.00    Years: 35.00    Pack years: 35.00    Types: Cigarettes    Quit date: 01/09/2018    Years since quitting: 1.8  . Smokeless tobacco: Never Used  Substance and Sexual Activity  . Alcohol use: Not Currently  . Drug use: Never  . Sexual activity: Not on file  Other Topics Concern  . Not on file  Social  History Narrative  . Not on file   Social Determinants of Health   Financial Resource Strain:   . Difficulty of Paying Living Expenses:   Food Insecurity:   . Worried About Charity fundraiser in the Last Year:   . Arboriculturist in the Last Year:   Transportation Needs:   . Film/video editor (Medical):   Marland Kitchen Lack of Transportation (Non-Medical):   Physical Activity:   . Days of Exercise per Week:   . Minutes of Exercise per Session:   Stress:   . Feeling of Stress :   Social Connections:   . Frequency of Communication with Friends and Family:   . Frequency of Social Gatherings with Friends and Family:   . Attends Religious Services:   . Active Member of Clubs or Organizations:   . Attends Archivist Meetings:   Marland Kitchen Marital Status:     Vitals:   11/13/19 0831  BP: 110/70  Pulse: 70  Resp: 12  Temp: 98.1 F (36.7 C)  SpO2: 97%   Body mass index is 40.17 kg/m.  Physical Exam Vitals and nursing note reviewed.  Constitutional:      General: She is not in acute distress.    Appearance: She is well-developed.  HENT:     Head: Normocephalic and atraumatic.     Mouth/Throat:     Mouth: Mucous membranes are moist.     Pharynx: Oropharynx is clear.  Eyes:     Conjunctiva/sclera: Conjunctivae normal.     Pupils: Pupils are equal, round, and reactive to light.  Cardiovascular:     Rate and Rhythm: Normal rate and regular rhythm.     Pulses:          Dorsalis pedis pulses are 2+ on the right side and 2+ on the left side.       Posterior tibial pulses are 2+ on the right side and 2+ on the left side.     Heart sounds: No murmur heard.      Comments: Purplish toes,cold,no ulcers. Normal capillary refill. Pulmonary:     Effort: Pulmonary effort is normal. No respiratory distress.     Breath sounds: Normal breath sounds.  Abdominal:     Palpations: Abdomen is soft. There is no hepatomegaly or mass.     Tenderness: There is no abdominal tenderness.    Musculoskeletal:     Comments: Some tender trigger points on back.  Lymphadenopathy:     Cervical: No cervical adenopathy.  Skin:    General: Skin is warm.     Findings: No erythema or rash.     Comments: Onychomycosis great toenails, R>L. Tenderness with palpation of left toenail. No periungual edema or erythema.  Neurological:     Mental Status: She is alert and oriented to person, place, and time.     Cranial Nerves: No cranial nerve deficit.     Sensory: Sensory deficit (Left foot, decreased monofilament.) present.     Gait: Gait normal.  Comments: Antalgic and unstable gait assisted with a cane.  Psychiatric:     Comments: Well groomed, good eye contact.    ASSESSMENT AND PLAN:  Amanda Cordova was seen today for establish care.  Diagnoses and all orders for this visit: Orders Placed This Encounter  Procedures  . Vitamin B12  . VITAMIN D 25 Hydroxy (Vit-D Deficiency, Fractures)  . ANA  . BASIC METABOLIC PANEL WITH GFR  . C-reactive protein  . Sedimentation rate  . TSH  . Ambulatory referral to Podiatry   Lab Results  Component Value Date   VITAMINB12 500 11/13/2019   Lab Results  Component Value Date   CREATININE 0.73 11/13/2019   BUN 14 11/13/2019   NA 143 11/13/2019   K 4.3 11/13/2019   CL 108 11/13/2019   CO2 28 11/13/2019   Lab Results  Component Value Date   CRP 7.2 11/13/2019   Lab Results  Component Value Date   ESRSEDRATE 14 11/13/2019   Burning sensation of feet Chronic. Good foot care. Podiatry referral placed. Further recommendations according to lab results.  Morbid obesity ([place]) Consistency with healthy diet and physical activity recommended.  Vitamin D deficiency, unspecified No changes in current management, will follow 70 [place] vit D and will give further recommendations accordingly.  Raynaud's phenomenon without gangrene We discussed dx.prognosisn,and treatment options. She agrees with trying Amlodipine. Avoid trigger  factors. Further recommendations according to lab results.  -     amLODipine (NORVASC) 5 MG tablet; Take 0.5 tablets (2.5 mg total) by mouth daily.  Myalgia due to statin Continue low fat diet.  Morton's neuroma, unspecified laterality -     Ambulatory referral to Podiatry  Centrilobular emphysema ([place]) Not well controlled. Albuterol inh started today.  -     albuterol (VENTOLIN HFA) 108 (90 Base) MCG/ACT inhaler; Inhale 2 puffs into the lungs every 6 (six) hours as needed for wheezing or shortness of breath.  Essential hypertension BP adequately controlled. Losartan changed to Amlodipine 2.5 mg daily. Continue low salt diet. Monitor  BP regularly.  -     amLODipine (NORVASC) 5 MG tablet; Take 0.5 tablets (2.5 mg total) by mouth daily.   Return in about 6 weeks (around 12/25/2019).   Climmie Buelow G. [name], MD  [facility]. [place] office.  A few things to remember from today's visit:  Good foot care. Stop Losartan. Started Amlodipine 2.5 mg daily and continue monitoring blood pressure. Podiatrist eva;luation will be arranged.  If you need refills please call your pharmacy. Do not use My Chart to request refills or for acute issues that need immediate attention.    Please be sure medication list is accurate. If a new problem present, please set up appointment sooner than planned today.

## 2019-11-13 NOTE — Patient Instructions (Signed)
A few things to remember from today's visit:  Good foot care. Stop Losartan. Started Amlodipine 2.5 mg daily and continue monitoring blood pressure. Podiatrist eva;luation will be arranged.  If you need refills please call your pharmacy. Do not use My Chart to request refills or for acute issues that need immediate attention.    Please be sure medication list is accurate. If a new problem present, please set up appointment sooner than planned today.

## 2019-11-14 LAB — BASIC METABOLIC PANEL WITH GFR
BUN: 14 mg/dL (ref 7–25)
CO2: 28 mmol/L (ref 20–32)
Calcium: 9.5 mg/dL (ref 8.6–10.4)
Chloride: 108 mmol/L (ref 98–110)
Creat: 0.73 mg/dL (ref 0.50–0.99)
GFR, Est African American: 104 mL/min/{1.73_m2} (ref >=60)
GFR, Est Non African American: 90 mL/min/{1.73_m2} (ref >=60)
Glucose, Bld: 102 mg/dL — ABNORMAL HIGH (ref 65–99)
Potassium: 4.3 mmol/L (ref 3.5–5.3)
Sodium: 143 mmol/L (ref 135–146)

## 2019-11-14 LAB — VITAMIN D 25 HYDROXY (VIT D DEFICIENCY, FRACTURES): Vit D, 25-Hydroxy: 63 ng/mL (ref 30–100)

## 2019-11-14 LAB — C-REACTIVE PROTEIN: CRP: 7.2 mg/L (ref <8.0)

## 2019-11-14 LAB — VITAMIN B12: Vitamin B-12: 500 pg/mL (ref 200–1100)

## 2019-11-14 LAB — SEDIMENTATION RATE: Sed Rate: 14 mm/h (ref 0–30)

## 2019-11-16 ENCOUNTER — Encounter: Payer: Self-pay | Admitting: Family Medicine

## 2019-11-26 ENCOUNTER — Encounter: Payer: Self-pay | Admitting: Podiatry

## 2019-11-26 ENCOUNTER — Ambulatory Visit: Payer: Medicare HMO | Admitting: Podiatry

## 2019-11-26 ENCOUNTER — Telehealth: Payer: Self-pay | Admitting: *Deleted

## 2019-11-26 ENCOUNTER — Ambulatory Visit (INDEPENDENT_AMBULATORY_CARE_PROVIDER_SITE_OTHER): Payer: Medicare HMO

## 2019-11-26 ENCOUNTER — Other Ambulatory Visit: Payer: Self-pay

## 2019-11-26 DIAGNOSIS — M2042 Other hammer toe(s) (acquired), left foot: Secondary | ICD-10-CM

## 2019-11-26 DIAGNOSIS — Q828 Other specified congenital malformations of skin: Secondary | ICD-10-CM | POA: Diagnosis not present

## 2019-11-26 DIAGNOSIS — M778 Other enthesopathies, not elsewhere classified: Secondary | ICD-10-CM

## 2019-11-26 DIAGNOSIS — G5793 Unspecified mononeuropathy of bilateral lower limbs: Secondary | ICD-10-CM

## 2019-11-26 DIAGNOSIS — M2041 Other hammer toe(s) (acquired), right foot: Secondary | ICD-10-CM | POA: Diagnosis not present

## 2019-11-26 DIAGNOSIS — D3613 Benign neoplasm of peripheral nerves and autonomic nervous system of lower limb, including hip: Secondary | ICD-10-CM

## 2019-11-26 MED ORDER — MELOXICAM 15 MG PO TABS: 15.0000 mg | ORAL_TABLET | Freq: Every day | ORAL | 3 refills | Status: DC

## 2019-11-26 NOTE — Telephone Encounter (Signed)
Prepared required form, demographics to be faxed with 11/26/2019 clinicals to [facility].

## 2019-11-26 NOTE — Telephone Encounter (Signed)
-----   Message from [place] sent at 11/26/2019  9:05 AM EDT ----- Regarding: Nerve Testing Nerve conduction and EMG - neuropathy bilateral (R>L)

## 2019-11-27 ENCOUNTER — Other Ambulatory Visit: Payer: Self-pay | Admitting: Podiatry

## 2019-11-27 DIAGNOSIS — M778 Other enthesopathies, not elsewhere classified: Secondary | ICD-10-CM

## 2019-11-27 NOTE — Progress Notes (Signed)
Subjective:  Patient ID: Amanda Cordova, female    DOB: [birth year],  MRN: [id number] HPI Chief Complaint  Patient presents with  . Foot Pain    Plantar forefoot right - aching x months, feels pain running down lateral side, toes curling, large callused area posterior heel, Dr. Gershon Mussel injected-treated for neuroma, left plantar forefoot hurts occasionally  . Toe Pain    Hallux nails "burn", using Vicks  . New Patient (Initial Visit)    59 y.o. female presents with the above complaint.   ROS: Denies fever chills nausea vomiting muscle aches pains calf pain back pain chest pain shortness of breath.  Past Medical History:  Diagnosis Date  . Acoustic neuroma ([place])   . CAD (coronary artery disease)   . COPD (chronic obstructive pulmonary disease) ([place])   . Hypertension    Past Surgical History:  Procedure Laterality Date  . ABDOMINAL HYSTERECTOMY    . BREAST EXCISIONAL BIOPSY Right 2000   benign  . cerical fusion  1995  . CHOLECYSTECTOMY    . 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 HYSTERECTOMY ABDOMINAL WITH SALPINGECTOMY      Current Outpatient Medications:  .  albuterol (VENTOLIN HFA) 108 (90 Base) MCG/ACT inhaler, Inhale 2 puffs into the lungs every 6 (six) hours as needed for wheezing or shortness of breath., Disp: 18 g, Rfl: 1 .  ALPRAZolam (XANAX) 0.25 MG tablet, Take 0.25 mg by mouth daily as needed for anxiety or sleep. , Disp: , Rfl:  .  amLODipine (NORVASC) 5 MG tablet, Take 0.5 tablets (2.5 mg total) by mouth daily., Disp: 30 tablet, Rfl: 2 .  aspirin EC 81 MG tablet, Take 1 tablet (81 mg total) by mouth daily., Disp: , Rfl:  .  meloxicam (MOBIC) 15 MG tablet, Take 1 tablet (15 mg total) by mouth daily., Disp: 30 tablet, Rfl: 3 .  Multiple Vitamins-Minerals (CENTRUM SILVER PO), Take 1 tablet by mouth daily., Disp: , Rfl:  .  Vitamin D, Ergocalciferol, (DRISDOL) 50000 units CAPS capsule, Take 50,000 Units by mouth every 7 (seven) days. , Disp: , Rfl:    Allergies  Allergen Reactions  . Gabapentin Anaphylaxis and Swelling    Throat and tongue swells Throat and tongue swells  . Tramadol Swelling    Throat and hands  . Lisinopril Nausea Only   Review of Systems Objective:  There were no vitals filed for this visit.  General: Well developed, nourished, in no acute distress, alert and oriented x3   Dermatological: Skin is warm, dry and supple bilateral. Nails x 10 are well maintained; remaining integument appears unremarkable at this time. There are no open sores, no preulcerative lesions, no rash or signs of infection present.  Reactive hyperkeratotic lesion posterior lateral aspect of the right heel does not demonstrate any foreign body but does not appear to be verrucoid in nature and that the skin lines do pass through the lesion and there are no thrombosed capillaries..  Vascular: Dorsalis Pedis artery and Posterior Tibial artery pedal pulses are 2/4 bilateral with immedate capillary fill time. Pedal hair growth present. No varicosities and no lower extremity edema present bilateral.   Neruologic: Grossly intact via light touch bilateral. Vibratory intact via tuning fork bilateral. Protective threshold with Semmes Wienstein monofilament intact to all pedal sites bilateral. Patellar and Achilles deep tendon reflexes 2+ bilateral. No Babinski or clonus noted bilateral.   Musculoskeletal: No gross boney pedal deformities bilateral. No pain, crepitus, or limitation noted with foot and ankle  range of motion bilateral. Muscular strength 5/5 in all groups tested bilateral.  No reproducible pain on palpation forefoot bilaterally or midfoot.  Gait: Unassisted, Nonantalgic.    Radiographs:  Radiographs demonstrate no significant osseous abnormalities early osteoarthritic changes midfoot.  No acute findings.  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  Should her nerve conduction velocity exam come back normal we will consider an epidermal nerve fiber test.     Turhan Chill T. [place], [place]

## 2019-11-28 ENCOUNTER — Other Ambulatory Visit: Payer: Self-pay | Admitting: Family Medicine

## 2019-11-28 NOTE — Telephone Encounter (Signed)
Pt needs the following medication refilled:  ALPRAZolam (XANAX) 0.25 MG tablet   [id number] [place], [place] - [address] AT [facility] [address] & [facility]  [address] [place] [address]  Phone:  [phone] Fax:  [phone]   Please advise

## 2019-12-02 ENCOUNTER — Other Ambulatory Visit: Payer: Self-pay | Admitting: Family Medicine

## 2019-12-02 MED ORDER — ALPRAZOLAM 0.25 MG PO TABS: 0.2500 mg | ORAL_TABLET | Freq: Every day | ORAL | 2 refills | Status: DC | PRN

## 2019-12-02 NOTE — Telephone Encounter (Signed)
Patient takes Alprazolam 0.25 being filled last by her old pcp.  She is requesting a refill from Dr. [name].   Pharmacy- [facility] on [address]

## 2019-12-02 NOTE — Addendum Note (Signed)
Addended by: Rodrigo Ran on: 12/02/2019 08:02 AM   Modules accepted: Orders

## 2019-12-06 ENCOUNTER — Other Ambulatory Visit: Payer: Self-pay | Admitting: Family Medicine

## 2019-12-06 DIAGNOSIS — J432 Centrilobular emphysema: Secondary | ICD-10-CM

## 2019-12-16 ENCOUNTER — Telehealth (INDEPENDENT_AMBULATORY_CARE_PROVIDER_SITE_OTHER): Payer: Medicare HMO | Admitting: Podiatry

## 2019-12-16 NOTE — Telephone Encounter (Signed)
[place] patient. States she was supposed to receive referral for nerve conduction study. She has not been contacted. Please advise.

## 2019-12-16 NOTE — Telephone Encounter (Signed)
Please assist with this.  Thanks

## 2019-12-25 ENCOUNTER — Encounter: Payer: Self-pay | Admitting: Family Medicine

## 2019-12-25 ENCOUNTER — Telehealth: Payer: Self-pay | Admitting: Podiatry

## 2019-12-25 ENCOUNTER — Ambulatory Visit (INDEPENDENT_AMBULATORY_CARE_PROVIDER_SITE_OTHER): Payer: Medicare HMO | Admitting: Family Medicine

## 2019-12-25 ENCOUNTER — Other Ambulatory Visit: Payer: Self-pay

## 2019-12-25 VITALS — BP 136/80 | HR 65 | Temp 98.1°F | Resp 16 | Ht 64.0 in | Wt 241.1 lb

## 2019-12-25 DIAGNOSIS — R208 Other disturbances of skin sensation: Secondary | ICD-10-CM

## 2019-12-25 DIAGNOSIS — L089 Local infection of the skin and subcutaneous tissue, unspecified: Secondary | ICD-10-CM

## 2019-12-25 DIAGNOSIS — I10 Essential (primary) hypertension: Secondary | ICD-10-CM | POA: Diagnosis not present

## 2019-12-25 DIAGNOSIS — J432 Centrilobular emphysema: Secondary | ICD-10-CM

## 2019-12-25 DIAGNOSIS — I7300 Raynaud's syndrome without gangrene: Secondary | ICD-10-CM | POA: Diagnosis not present

## 2019-12-25 MED ORDER — AMLODIPINE BESYLATE 5 MG PO TABS: 5.0000 mg | ORAL_TABLET | Freq: Every day | ORAL | 1 refills | Status: DC

## 2019-12-25 MED ORDER — BUDESONIDE-FORMOTEROL FUMARATE 160-4.5 MCG/ACT IN AERO: 2.0000 | INHALATION_SPRAY | Freq: Two times a day (BID) | RESPIRATORY_TRACT | 3 refills | Status: DC

## 2019-12-25 MED ORDER — MUPIROCIN 2 % EX OINT: 1.0000 "application " | TOPICAL_OINTMENT | Freq: Two times a day (BID) | CUTANEOUS | 0 refills | Status: AC

## 2019-12-25 NOTE — Progress Notes (Signed)
HPI: [name] is a 60 y.o. female, who is here today to follow on recent OV/ER.   Last visit she was complaining about call/blue toes. She started amlodipine 2.5 mg daily, she has noted some improvement. She has tolerated medication well. Burning feet sensation has slightly improved. Following with podiatrist. She is supposed to have an EMG done but has not received appt information.  Hypertension: Home BPs 130s/80s. She has not noted frequent/severe headache, CP, palpitations, or neurologic focal deficit. LE edema is stable.  Blurry vision, which has been intermittent for a while. She has been hospitalized for studies. Negative for diplopia. She is trying to establish with ophthalmologist.  She uses Albuterol inh q 2 days and when walking her dog. "Little wheezing." She quit smoking. Gained some wt. It is difficult for her to exercise regularly due to chronic knee pain.  Concerned about a skin lesion she noted a few days ago, left-sided thorax. She has had similar lesion in the same place in the past. Sore upon palpation. Negative for fever, chills, change in appetite. She has not tried OTC medications.  Review of Systems  Constitutional: Negative for activity change, appetite change and fever.  HENT: Negative for mouth sores, nosebleeds and sore throat.   Eyes: Negative for pain and redness.  Respiratory: Negative for cough.   Gastrointestinal: Negative for abdominal pain, nausea and vomiting.       Negative for changes in bowel habits.  Genitourinary: Negative for decreased urine volume and hematuria.  Musculoskeletal: Positive for arthralgias and gait problem.  Neurological: Negative for syncope, facial asymmetry and weakness.  Rest see pertinent positives and negatives per HPI.  Current Outpatient Medications on File Prior to Visit  Medication Sig Dispense Refill  . albuterol (VENTOLIN HFA) 108 (90 Base) MCG/ACT inhaler INHALE 2 PUFFS INTO THE  LUNGS EVERY 6 HOURS AS NEEDED FOR WHEEZING OR SHORTNESS OF BREATH 54 g 1  . ALPRAZolam (XANAX) 0.25 MG tablet Take 1 tablet (0.25 mg total) by mouth daily as needed for anxiety or sleep. 30 tablet 2  . aspirin EC 81 MG tablet Take 1 tablet (81 mg total) by mouth daily.    . meloxicam (MOBIC) 15 MG tablet Take 1 tablet (15 mg total) by mouth daily. 30 tablet 3  . Multiple Vitamins-Minerals (CENTRUM SILVER PO) Take 1 tablet by mouth daily.    . Vitamin D, Ergocalciferol, (DRISDOL) 50000 units CAPS capsule Take 50,000 Units by mouth every 7 (seven) days.      No current facility-administered medications on file prior to visit.     Past Medical History:  Diagnosis Date  . Acoustic neuroma ([place])   . CAD (coronary artery disease)   . COPD (chronic obstructive pulmonary disease) ([place])   . Hypertension    Allergies  Allergen Reactions  . Gabapentin Anaphylaxis and Swelling    Throat and tongue swells Throat and tongue swells  . Tramadol Swelling    Throat and hands  . Lisinopril Nausea Only    Social History   Socioeconomic History  . Marital status: Married    Spouse name: Not on file  . Number of children: 4  . Years of education: Not on file  . Highest education level: Not on file  Occupational History  . Not on file  Tobacco Use  . Smoking status: Former Smoker    Packs/day: 1.00    Years: 35.00    Pack years: 35.00    Types: Cigarettes  Quit date: 01/09/2018    Years since quitting: 1.9  . Smokeless tobacco: Never Used  Substance and Sexual Activity  . Alcohol use: Not Currently  . Drug use: Never  . Sexual activity: Not on file  Other Topics Concern  . Not on file  Social History Narrative  . Not on file   Social Determinants of Health   Financial Resource Strain:   . Difficulty of Paying Living Expenses: Not on file  Food Insecurity:   . Worried About Charity fundraiser in the Last Year: Not on file  . Ran Out of Food in the Last Year: Not on file    Transportation Needs:   . Lack of Transportation (Medical): Not on file  . Lack of Transportation (Non-Medical): Not on file  Physical Activity:   . Days of Exercise per Week: Not on file  . Minutes of Exercise per Session: Not on file  Stress:   . Feeling of Stress : Not on file  Social Connections:   . Frequency of Communication with Friends and Family: Not on file  . Frequency of Social Gatherings with Friends and Family: Not on file  . Attends Religious Services: Not on file  . Active Member of Clubs or Organizations: Not on file  . Attends Archivist Meetings: Not on file  . Marital Status: Not on file    Vitals:   12/25/19 0911  BP: 136/80  Pulse: 65  Resp: 16  Temp: 98.1 F (36.7 C)  SpO2: 95%   Wt Readings from Last 3 Encounters:  12/25/19 241 lb 2 oz (109.4 kg)  11/13/19 (!) 234 lb (106.1 kg)  06/19/19 240 lb (108.9 kg)   Body mass index is 41.39 kg/m.  Physical Exam Vitals and nursing note reviewed.  Constitutional:      General: She is not in acute distress.    Appearance: She is well-developed.  HENT:     Head: Normocephalic and atraumatic.     Mouth/Throat:     Mouth: Mucous membranes are dry.     Pharynx: Oropharynx is clear.  Eyes:     Conjunctiva/sclera: Conjunctivae normal.  Cardiovascular:     Rate and Rhythm: Normal rate and regular rhythm.     Pulses:          Dorsalis pedis pulses are 2+ on the right side and 2+ on the left side.     Heart sounds: No murmur heard.   Pulmonary:     Effort: Pulmonary effort is normal. No respiratory distress.     Breath sounds: Normal breath sounds.  Abdominal:     Palpations: Abdomen is soft. There is no hepatomegaly or mass.     Tenderness: There is no abdominal tenderness.  Lymphadenopathy:     Cervical: No cervical adenopathy.  Skin:    General: Skin is warm.     Findings: No abscess.          Comments: 1 cm erythematous area with central pustula.  Tender upon palpation, no induration  or fluctuant area.  Neurological:     Mental Status: She is alert and oriented to person, place, and time.     Cranial Nerves: No cranial nerve deficit.     Comments: Antalgic gait assisted by a cane.  Psychiatric:     Comments: Well groomed, good eye contact.   ASSESSMENT AND PLAN:  [name] was seen today for follow-up.  Diagnoses and all orders for this visit:  Raynaud's phenomenon without gangrene  Improved with Amlodipine, dose increased. Continue avoiding trigger factors.  -     amLODipine (NORVASC) 5 MG tablet; Take 1 tablet (5 mg total) by mouth daily.  Burning sensation of feet Neuropathy. Pending EMG. Good foot care to continue.  Pustular lesion I do not think oral abx is needed. Keep lesion un-coved and clean with soap and water. Monitor for new or worsening problem.  -     mupirocin ointment (BACTROBAN) 2 %; Apply 1 application topically 2 (two) times daily for 10 days.  Essential hypertension Ideally BP 130/80 or less. Amlodipine dose increased from 2.5 mg to 5 mg at bedtime. Continue monitoring BP and low salt diet.  -     amLODipine (NORVASC) 5 MG tablet; Take 1 tablet (5 mg total) by mouth daily.  Centrilobular emphysema ([place]) Problem is not well controlled. Symbicort inh added today. Continue Albuterol inh 2 puff q 4-6 hours as needed.  -     budesonide-formoterol (SYMBICORT) 160-4.5 MCG/ACT inhaler; Inhale 2 puffs into the lungs 2 (two) times daily.  Return in about 3 months (around 03/25/2020) for cpe.   Jake Fuhrmann G. [name], MD  [facility]. [place] office.  A few things to remember from today's visit:  Increase Amlodipine to whole tab. Continue monitoring blood pressure. Pending foot test.  Mupirocin on skin lesion. Needs to sign a release form to obtain records.  If you need refills please call your pharmacy. Do not use My Chart to request refills or for acute issues that need immediate attention.    Please be sure  medication list is accurate. If a new problem present, please set up appointment sooner than planned today.

## 2019-12-25 NOTE — Telephone Encounter (Signed)
Called [facility] - spoke with referral coordinator, she said she sees the orders placed for the study, however, a referral to Neurology needs to be placed for it to drop into their work queue for contacting the patient for appointments. I went ahead and placed the referral notating "NCV with EMG" needed to be done.   She will contact the patient once processed.  I left message with the patient letting her know this information.

## 2019-12-25 NOTE — Telephone Encounter (Signed)
Amanda Cordova has placed the order for the NCS today at 1145am. They will reach out to the patient to schedule the appointment. Thank you

## 2019-12-25 NOTE — Telephone Encounter (Signed)
Pt called and said she has not received a call about her nerve conduction study.

## 2019-12-25 NOTE — Patient Instructions (Addendum)
A few things to remember from today's visit:  Increase Amlodipine to whole tab. Continue monitoring blood pressure. Pending foot test.  Mupirocin on skin lesion. Needs to sign a release form to obtain records.  If you need refills please call your pharmacy. Do not use My Chart to request refills or for acute issues that need immediate attention.    Please be sure medication list is accurate. If a new problem present, please set up appointment sooner than planned today.

## 2019-12-26 ENCOUNTER — Encounter: Payer: Self-pay | Admitting: Family Medicine

## 2019-12-30 DIAGNOSIS — I10 Essential (primary) hypertension: Secondary | ICD-10-CM | POA: Diagnosis not present

## 2019-12-30 DIAGNOSIS — J432 Centrilobular emphysema: Secondary | ICD-10-CM | POA: Diagnosis not present

## 2019-12-30 DIAGNOSIS — I2510 Atherosclerotic heart disease of native coronary artery without angina pectoris: Secondary | ICD-10-CM | POA: Diagnosis not present

## 2019-12-31 LAB — LIPID PANEL
Chol/HDL Ratio: 3.9 ratio (ref 0.0–4.4)
Cholesterol, Total: 150 mg/dL (ref 100–199)
HDL: 38 mg/dL — ABNORMAL LOW (ref >39)
LDL Chol Calc (NIH): 91 mg/dL (ref 0–99)
Triglycerides: 114 mg/dL (ref 0–149)
VLDL Cholesterol Cal: 21 mg/dL (ref 5–40)

## 2019-12-31 NOTE — Progress Notes (Signed)
HPI: FU CAD. Previously had chest CT for lung cancer screening. Noted to have aortic atherosclerosis and coronary artery calcification. COPD also noted.  Nuclear study November 2019 showed ejection fraction 60%, attenuation artifact but no ischemia. Echocardiogram November 2019 showed normal LV function, moderate left ventricular hypertrophy, mild tricuspid regurgitation.  Since last seen, she denies increased dyspnea, chest pain, palpitations or syncope.  Current Outpatient Medications  Medication Sig Dispense Refill  . albuterol (VENTOLIN HFA) 108 (90 Base) MCG/ACT inhaler INHALE 2 PUFFS INTO THE LUNGS EVERY 6 HOURS AS NEEDED FOR WHEEZING OR SHORTNESS OF BREATH 54 g 1  . ALPRAZolam (XANAX) 0.25 MG tablet Take 1 tablet (0.25 mg total) by mouth daily as needed for anxiety or sleep. 30 tablet 2  . amLODipine (NORVASC) 5 MG tablet Take 1 tablet (5 mg total) by mouth daily. 90 tablet 1  . aspirin EC 81 MG tablet Take 1 tablet (81 mg total) by mouth daily.    . budesonide-formoterol (SYMBICORT) 160-4.5 MCG/ACT inhaler Inhale 2 puffs into the lungs 2 (two) times daily. 1 each 3  . meloxicam (MOBIC) 15 MG tablet Take 1 tablet (15 mg total) by mouth daily. 30 tablet 3  . Multiple Vitamins-Minerals (CENTRUM SILVER PO) Take 1 tablet by mouth daily.    . Vitamin D, Ergocalciferol, (DRISDOL) 50000 units CAPS capsule Take 50,000 Units by mouth every 7 (seven) days.      No current facility-administered medications for this visit.     Past Medical History:  Diagnosis Date  . Acoustic neuroma ([place])   . CAD (coronary artery disease)   . COPD (chronic obstructive pulmonary disease) ([place])   . Hypertension     Past Surgical History:  Procedure Laterality Date  . ABDOMINAL HYSTERECTOMY    . BREAST EXCISIONAL BIOPSY Right 2000   benign  . cerical fusion  1995  . CHOLECYSTECTOMY    . 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 HYSTERECTOMY ABDOMINAL WITH SALPINGECTOMY      Social History    Socioeconomic History  . Marital status: Married    Spouse name: Not on file  . Number of children: 4  . Years of education: Not on file  . Highest education level: Not on file  Occupational History  . Not on file  Tobacco Use  . Smoking status: Former Smoker    Packs/day: 1.00    Years: 35.00    Pack years: 35.00    Types: Cigarettes    Quit date: 01/09/2018    Years since quitting: 2.0  . Smokeless tobacco: Never Used  Substance and Sexual Activity  . Alcohol use: Not Currently  . Drug use: Never  . Sexual activity: Not on file  Other Topics Concern  . Not on file  Social History Narrative  . Not on file   Social Determinants of Health   Financial Resource Strain:   . Difficulty of Paying Living Expenses: Not on file  Food Insecurity:   . Worried About Charity fundraiser in the Last Year: Not on file  . Ran Out of Food in the Last Year: Not on file  Transportation Needs:   . Lack of Transportation (Medical): Not on file  . Lack of Transportation (Non-Medical): Not on file  Physical Activity:   . Days of Exercise per Week: Not on file  . Minutes of Exercise per Session: Not on file  Stress:   . Feeling of Stress : Not on file  Social Connections:   . Frequency of Communication with Friends and Family: Not on file  . Frequency of Social Gatherings with Friends and Family: Not on file  . Attends Religious Services: Not on file  . Active Member of Clubs or Organizations: Not on file  . Attends Archivist Meetings: Not on file  . Marital Status: Not on file  Intimate Partner Violence:   . Fear of Current or Ex-Partner: Not on file  . Emotionally Abused: Not on file  . Physically Abused: Not on file  . Sexually Abused: Not on file    Family History  Problem Relation Age of Onset  . Breast cancer Sister   . COPD Mother   . Heart failure Mother   . Heart attack Father     ROS: no fevers or chills, productive cough, hemoptysis, dysphasia,  odynophagia, melena, hematochezia, dysuria, hematuria, rash, seizure activity, orthopnea, PND, pedal edema, claudication. Remaining systems are negative.  Physical Exam: Well-developed well-nourished in no acute distress.  Skin is warm and dry.  HEENT is normal.  Neck is supple.  Chest is clear to auscultation with normal expansion.  Cardiovascular exam is regular rate and rhythm.  Abdominal exam nontender or distended. No masses palpated. Extremities show no edema. neuro grossly intact  ECG-normal sinus rhythm at a rate of 61, no ST changes.  Personally reviewed  A/P  1 coronary calcification/coronary artery disease-this was noted previously on CT scan.  Continue aspirin and resume statin.  Note previous nuclear study showed no ischemia.  2 dyspnea-likely secondary to COPD.  Echocardiogram showed normal LV function and nuclear study showed no ischemia.  3 hypertension-patient's blood pressure is mildly elevated.  Increase amlodipine to 10 mg daily and follow.  4 tobacco abuse-patient has now discontinued for the past 2 to 3 months.  I encouraged her to continue off of cigarettes.  5 hyperlipidemia-patient did not tolerate Lipitor or Crestor.  I will try pravastatin 40 mg daily.  If she tolerates we will check lipids and liver in 12 weeks.  Otherwise we will try Zetia.  Kirk Ruths, MD

## 2020-01-14 ENCOUNTER — Encounter: Payer: Self-pay | Admitting: Cardiology

## 2020-01-14 ENCOUNTER — Ambulatory Visit: Payer: Medicare HMO | Admitting: Cardiology

## 2020-01-14 ENCOUNTER — Other Ambulatory Visit: Payer: Self-pay

## 2020-01-14 VITALS — BP 144/78 | HR 61 | Ht 64.0 in | Wt 242.4 lb

## 2020-01-14 DIAGNOSIS — I7300 Raynaud's syndrome without gangrene: Secondary | ICD-10-CM

## 2020-01-14 DIAGNOSIS — E785 Hyperlipidemia, unspecified: Secondary | ICD-10-CM

## 2020-01-14 DIAGNOSIS — I2510 Atherosclerotic heart disease of native coronary artery without angina pectoris: Secondary | ICD-10-CM

## 2020-01-14 DIAGNOSIS — I10 Essential (primary) hypertension: Secondary | ICD-10-CM

## 2020-01-14 MED ORDER — AMLODIPINE BESYLATE 10 MG PO TABS: 10.0000 mg | ORAL_TABLET | Freq: Every day | ORAL | 3 refills | Status: DC

## 2020-01-14 MED ORDER — PRAVASTATIN SODIUM 40 MG PO TABS: 40.0000 mg | ORAL_TABLET | Freq: Every evening | ORAL | 3 refills | Status: DC

## 2020-01-14 NOTE — Addendum Note (Signed)
Addended by: Zebedee Iba on: 01/14/2020 08:19 AM   Modules accepted: Orders

## 2020-01-14 NOTE — Patient Instructions (Signed)
Medication Instructions:   INCREASE AMLODIPINE TO 10 MG ONCE DAILY= 2 OF THE 5 MG TABLETS ONCE DAILY  START PRAVASTATIN 40 MG ONCE DAILY  *If you need a refill on your cardiac medications before your next appointment, please call your pharmacy*   Lab Work:  Your physician recommends that you return for lab work in: [facility]  If you have labs (blood work) drawn today and your tests are completely normal, you will receive your results only by: Marland Kitchen MyChart Message (if you have MyChart) OR . A paper copy in the mail If you have any lab test that is abnormal or we need to change your treatment, we will call you to review the results.   Follow-Up: At [facility], you and your health needs are our priority.  As part of our continuing mission to provide you with exceptional heart care, we have created designated Provider Care Teams.  These Care Teams include your primary Cardiologist (physician) and Advanced Practice Providers (APPs -  Physician Assistants and Nurse Practitioners) who all work together to provide you with the care you need, when you need it.  We recommend signing up for the patient portal called "MyChart".  Sign up information is provided on this After Visit Summary.  MyChart is used to connect with patients for Virtual Visits (Telemedicine).  Patients are able to view lab/test results, encounter notes, upcoming appointments, etc.  Non-urgent messages can be sent to your provider as well.   To learn more about what you can do with MyChart, go to [email or web address].    Your next appointment:   12 month(s)  The format for your next appointment:   In Person  Provider:   You may see Kirk Ruths, MD or one of the following Advanced Practice Providers on your designated Care Team:    Kerin Ransom, PA-C  [place], [place]  Coletta Memos, [place]

## 2020-01-17 DIAGNOSIS — I10 Essential (primary) hypertension: Secondary | ICD-10-CM

## 2020-01-17 MED ORDER — AMLODIPINE BESYLATE 10 MG PO TABS: 5.0000 mg | ORAL_TABLET | Freq: Every day | ORAL | 3 refills | Status: DC

## 2020-01-21 DIAGNOSIS — H469 Unspecified optic neuritis: Secondary | ICD-10-CM | POA: Diagnosis not present

## 2020-01-21 DIAGNOSIS — H353131 Nonexudative age-related macular degeneration, bilateral, early dry stage: Secondary | ICD-10-CM | POA: Diagnosis not present

## 2020-01-21 DIAGNOSIS — H25813 Combined forms of age-related cataract, bilateral: Secondary | ICD-10-CM | POA: Diagnosis not present

## 2020-01-29 DIAGNOSIS — H524 Presbyopia: Secondary | ICD-10-CM | POA: Diagnosis not present

## 2020-01-29 DIAGNOSIS — H52209 Unspecified astigmatism, unspecified eye: Secondary | ICD-10-CM | POA: Diagnosis not present

## 2020-01-29 DIAGNOSIS — H5213 Myopia, bilateral: Secondary | ICD-10-CM | POA: Diagnosis not present

## 2020-02-21 ENCOUNTER — Other Ambulatory Visit: Payer: Self-pay

## 2020-02-21 ENCOUNTER — Ambulatory Visit (INDEPENDENT_AMBULATORY_CARE_PROVIDER_SITE_OTHER)
Admission: RE | Admit: 2020-02-21 | Discharge: 2020-02-21 | Disposition: A | Discharge status: Home / Self Care | Payer: Medicare HMO | Source: Ambulatory Visit | Attending: Acute Care | Admitting: Acute Care

## 2020-02-21 DIAGNOSIS — Z87891 Personal history of nicotine dependence: Secondary | ICD-10-CM

## 2020-02-21 DIAGNOSIS — Z122 Encounter for screening for malignant neoplasm of respiratory organs: Secondary | ICD-10-CM

## 2020-02-25 NOTE — Progress Notes (Signed)
Please call patient and let them  know their  low dose Ct was read as a Lung RADS 2: nodules that are benign in appearance and behavior with a very low likelihood of becoming a clinically active cancer due to size or lack of growth. Recommendation per radiology is for a repeat LDCT in 12 months. .Please let them  know we will order and schedule their  annual screening scan for 02/2021. Please let them  know there was notation of CAD on their  scan.  Please remind the patient  that this is a non-gated exam therefore degree or severity of disease  cannot be determined. Please have them  follow up with their PCP regarding potential risk factor modification, dietary therapy or pharmacologic therapy if clinically indicated. Pt.  is  currently on statin therapy. Please place order for annual  screening scan for  02/2021  and fax results to PCP. Thanks so much.  Please let patient know there was notation of Hepatic steatosis. This  is a term that describes the build up of fat in the liver. It is normal to have small amounts of fat in your liver, but when the proportion of liver cells that contain fat exceeds more than 5% it is indicative of early stage fatty liver.Treatment often involves reducing risk factors through a diet and exercise plan. It is generally a benign condition, but in a small percentage of patients it does require follow up. Please have the patient follow up with PCP regarding potential risk factor modification, dietary therapy or pharmacologic therapy if clinically indicated.  Thanks so much

## 2020-02-26 ENCOUNTER — Other Ambulatory Visit: Payer: Self-pay | Admitting: *Deleted

## 2020-02-26 DIAGNOSIS — Z87891 Personal history of nicotine dependence: Secondary | ICD-10-CM

## 2020-03-05 ENCOUNTER — Ambulatory Visit: Payer: Medicare HMO | Admitting: Neurology

## 2020-03-05 ENCOUNTER — Other Ambulatory Visit: Payer: Self-pay | Admitting: Family Medicine

## 2020-03-05 ENCOUNTER — Telehealth: Payer: Self-pay | Admitting: Neurology

## 2020-03-05 ENCOUNTER — Other Ambulatory Visit: Payer: Self-pay

## 2020-03-05 ENCOUNTER — Encounter: Payer: Self-pay | Admitting: Neurology

## 2020-03-05 VITALS — BP 127/64 | HR 61 | Ht 64.0 in | Wt 242.5 lb

## 2020-03-05 DIAGNOSIS — E559 Vitamin D deficiency, unspecified: Secondary | ICD-10-CM | POA: Diagnosis not present

## 2020-03-05 DIAGNOSIS — R202 Paresthesia of skin: Secondary | ICD-10-CM | POA: Diagnosis not present

## 2020-03-05 DIAGNOSIS — R269 Unspecified abnormalities of gait and mobility: Secondary | ICD-10-CM

## 2020-03-05 DIAGNOSIS — R7989 Other specified abnormal findings of blood chemistry: Secondary | ICD-10-CM | POA: Diagnosis not present

## 2020-03-05 DIAGNOSIS — R799 Abnormal finding of blood chemistry, unspecified: Secondary | ICD-10-CM | POA: Diagnosis not present

## 2020-03-05 DIAGNOSIS — R7309 Other abnormal glucose: Secondary | ICD-10-CM | POA: Diagnosis not present

## 2020-03-05 DIAGNOSIS — R760 Raised antibody titer: Secondary | ICD-10-CM | POA: Diagnosis not present

## 2020-03-05 MED ORDER — DULOXETINE HCL 60 MG PO CPEP: 60.0000 mg | ORAL_CAPSULE | Freq: Every day | ORAL | 12 refills | Status: DC

## 2020-03-05 MED ORDER — DULOXETINE HCL 60 MG PO CPEP
60.0000 mg | ORAL_CAPSULE | Freq: Every day | ORAL | 12 refills | Status: DC
Start: 2020-03-05 — End: 2020-03-09

## 2020-03-05 NOTE — Progress Notes (Signed)
Chief Complaint  Patient presents with  . Consult    Reports pain, burning, tingling, swelling and cold sensations in her bilateral feet. She uses a cane to assist with ambulation. She had serious, adverse reactions to Neurontin and Tramadol in the past.   . Amanda Cordova, Amanda Cordova, [place] - referring provider  . PCP    [name], Amanda G, MD    HISTORICAL  Amanda Cordova is a 59 year old female, seen in request by her podiatrist Dr. Milinda Cordova, Amanda Cordova, DPM and primary care physician Dr. [name], Amanda Cordova for evaluation of worsening bilateral lower extremity paresthesia, initial evaluation was on March 05, 2020.  I reviewed and summarized the referring note. PMHx Left acoustic neuroma, presented with left hearing loss, vertigo, had surgery at hospital in [place] Probable optic neuritis Hospital admission to [facility] in September 2019 for left eye pain, blurry vision, was diagnosed with possible left optic neuritis, treated with a course of IV Solu-Medrol, I personally reviewed MRI of the brain, orbits with without contrast September 2019, no acute intracranial abnormality, status post left retrosigmoid craniotomy, no residual or recurrent tumor, mild to moderate chronic small vessel disease, atypical for classic demyelination, she still complains of bilateral blurry vision. Hypertension  She stepped on the curb wrong in 2019, had forceful right ankle inversion, suffered to right lateral ankle fracture, wearing boots for extended period of time, since then, she noticed intermittent numbness involving bilateral lower extremity, right much worse than left, she described paresthesia at the heel, and bottom of her feet, spreading to lateral part of her foot, lateral leg on the right side, left side is mainly involving her toes, and top of her toes.  She had gait abnormality since left acoustic neuroma surgery in 2011, there was no significant worsening  She also complains of  intermittent bilateral first 3 finger paresthesia,   Lab evaluation in July 2021, normal B12 500, vitamin D 63, BMP, glucose was mildly elevated at 102, normal ESR, C-reactive protein,    REVIEW OF SYSTEMS: Full 14 system review of systems performed and notable only for as above All other review of systems were negative.  ALLERGIES: Allergies  Allergen Reactions  . Gabapentin Anaphylaxis and Swelling    Throat and tongue swells Throat and tongue swells  . Tramadol Swelling    Throat and hands  . Lisinopril Nausea Only    HOME MEDICATIONS: Current Outpatient Medications  Medication Sig Dispense Refill  . albuterol (VENTOLIN HFA) 108 (90 Base) MCG/ACT inhaler INHALE 2 PUFFS INTO THE LUNGS EVERY 6 HOURS AS NEEDED FOR WHEEZING OR SHORTNESS OF BREATH 54 [place]ova 1  . ALPRAZolam (XANAX) 0.25 MG tablet Take 1 tablet (0.25 mg total) by mouth daily as needed for anxiety or sleep. 30 tablet 2  . amLODipine (NORVASC) 10 MG tablet Take 0.5 tablets (5 mg total) by mouth daily. 90 tablet 3  . aspirin EC 81 MG tablet Take 1 tablet (81 mg total) by mouth daily.    . budesonide-formoterol (SYMBICORT) 160-4.5 MCG/ACT inhaler Inhale 2 puffs into the lungs 2 (two) times daily. 1 each 3  . meloxicam (MOBIC) 15 MG tablet Take 1 tablet (15 mg total) by mouth daily. 30 tablet 3  . Multiple Vitamins-Minerals (CENTRUM SILVER PO) Take 1 tablet by mouth daily.    . Vitamin D, Ergocalciferol, (DRISDOL) 50000 units CAPS capsule Take 50,000 Units by mouth every 7 (seven) days.      No current facility-administered medications  for this visit.    PAST MEDICAL HISTORY: Past Medical History:  Diagnosis Date  . Acoustic neuroma ([place])   . CAD (coronary artery disease)   . COPD (chronic obstructive pulmonary disease) ([place])   . Hyperlipemia   . Hypertension   . Neuropathy   . Optic neuritis     PAST SURGICAL HISTORY: Past Surgical History:  Procedure Laterality Date  . ABDOMINAL HYSTERECTOMY    . BREAST  EXCISIONAL BIOPSY Right 2000   benign  . cerical fusion  1995  . CHOLECYSTECTOMY    . 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 HYSTERECTOMY ABDOMINAL WITH SALPINGECTOMY      FAMILY HISTORY: Family History  Problem Relation Age of Onset  . Breast cancer Sister   . Diabetes Sister   . COPD Mother   . Heart failure Mother   . Heart attack Father   . Diabetes Maternal Aunt     SOCIAL HISTORY: Social History   Socioeconomic History  . Marital status: Married    Spouse name: Not on file  . Number of children: 4  . Years of education: 72  . Highest education level: High school graduate  Occupational History  . Occupation: Disabled  Tobacco Use  . Smoking status: Former Smoker    Packs/day: 1.00    Years: 35.00    Pack years: 35.00    Types: Cigarettes    Quit date: 01/09/2018    Years since quitting: 2.1  . Smokeless tobacco: Never Used  Substance and Sexual Activity  . Alcohol use: Not Currently  . Drug use: Never  . Sexual activity: Not on file  Other Topics Concern  . Not on file  Social History Narrative   Lives at home with her husband and her great-grandson.   Right-handed.   2-3 cups caffeine per day.    Social Determinants of Health   Financial Resource Strain:   . Difficulty of Paying Living Expenses: Not on file  Food Insecurity:   . Worried About Charity fundraiser in the Last Year: Not on file  . Ran Out of Food in the Last Year: Not on file  Transportation Needs:   . Lack of Transportation (Medical): Not on file  . Lack of Transportation (Non-Medical): Not on file  Physical Activity:   . Days of Exercise per Week: Not on file  . Minutes of Exercise per Session: Not on file  Stress:   . Feeling of Stress : Not on file  Social Connections:   . Frequency of Communication with Friends and Family: Not on file  . Frequency of Social Gatherings with Friends and Family: Not on file  . Attends Religious Services: Not on file  . Active Member of  Clubs or Organizations: Not on file  . Attends Archivist Meetings: Not on file  . Marital Status: Not on file  Intimate Partner Violence:   . Fear of Current or Ex-Partner: Not on file  . Emotionally Abused: Not on file  . Physically Abused: Not on file  . Sexually Abused: Not on file     PHYSICAL EXAM   Vitals:   03/05/20 0736  BP: 127/64  Pulse: 61  Weight: 242 lb 8 oz (110 kg)  Height: $Remove'5\' 4"'ckUbDzM$  (1.626 m)   Not recorded     Body mass index is 41.63 kg/m.  PHYSICAL EXAMNIATION:  Gen: NAD, conversant, well nourised, well groomed  Cardiovascular: Regular rate rhythm, no peripheral edema, warm, nontender. Eyes: Conjunctivae clear without exudates or hemorrhage Neck: Supple, no carotid bruits. Pulmonary: Clear to auscultation bilaterally   NEUROLOGICAL EXAM:  MENTAL STATUS: Speech:    Speech is normal; fluent and spontaneous with normal comprehension.  Cognition:     Orientation to time, place and person     Normal recent and remote memory     Normal Attention span and concentration     Normal Language, naming, repeating,spontaneous speech     Fund of knowledge   CRANIAL NERVES: CN II: Visual fields are full to confrontation. Pupils are round equal and briskly reactive to light. CN III, IV, VI: extraocular movement are normal. No ptosis. CN V: Facial sensation is intact to light touch CN VII: Face is symmetric with normal eye closure  CN VIII: Decreased hearing at left ear  CN IX, X: Phonation is normal. CN XI: Head turning and shoulder shrug are intact  MOTOR: There is no pronator drift of out-stretched arms. Muscle bulk and tone are normal. Muscle strength is normal.  REFLEXES: Reflexes are 2+ and symmetric at the biceps, triceps, knees, and ankles. Plantar responses are flexor.  SENSORY: Length dependent decreased light touch, pinprick to mid shin level.  COORDINATION: There is no trunk or limb dysmetria  noted.  GAIT/STANCE: Need to push-up to get up from seated position, wide-based, unsteady, Positive Romberg signs   DIAGNOSTIC DATA (LABS, IMAGING, TESTING) - I reviewed patient records, labs, notes, testing and imaging myself where available.   ASSESSMENT AND PLAN  Hettie Roselli Sweeney is a 59 y.o. female   History of left acoustic neuroma, status post left retrosigmoid craniotomy, with residual gait abnormality Probable history of optic neuritis in the past, Worsening bilateral lower extremity paresthesia,  Mild length dependent sensory changes, hyperreflexia, unsteady gait on examinations,  Need to rule out central nervous system etiology, MRI of the brain cervical spine with without contrast,  Differentiation diagnoses also include peripheral neuropathy, EMG nerve conduction study  Laboratory evaluation for potential etiology     Marcial Pacas, M.D. Ph.D.  [facility] [address] [place], [place] [address] Ph: [phone] Fax: [phone]  CC:  Garrel Ridgel, DPM [address] [place],  [place] [address]  [name], Amanda G, MD

## 2020-03-05 NOTE — Telephone Encounter (Signed)
[employer] pending  

## 2020-03-05 NOTE — Telephone Encounter (Signed)
Amanda Cordova: [id number] (exp. 03/05/20 to 04/04/20) order sent to GI. They will reach out to the patient to schedule.

## 2020-03-06 NOTE — Telephone Encounter (Signed)
Last filled 02/04/20

## 2020-03-09 ENCOUNTER — Other Ambulatory Visit: Payer: Self-pay

## 2020-03-09 ENCOUNTER — Ambulatory Visit (INDEPENDENT_AMBULATORY_CARE_PROVIDER_SITE_OTHER): Payer: Medicare HMO | Admitting: Neurology

## 2020-03-09 ENCOUNTER — Ambulatory Visit: Payer: Medicare HMO | Admitting: Neurology

## 2020-03-09 DIAGNOSIS — R269 Unspecified abnormalities of gait and mobility: Secondary | ICD-10-CM

## 2020-03-09 DIAGNOSIS — R202 Paresthesia of skin: Secondary | ICD-10-CM

## 2020-03-09 DIAGNOSIS — Z86018 Personal history of other benign neoplasm: Secondary | ICD-10-CM

## 2020-03-09 LAB — RPR: RPR Ser Ql: NONREACTIVE

## 2020-03-09 LAB — ANA W/REFLEX IF POSITIVE: Anti Nuclear Antibody (ANA): NEGATIVE

## 2020-03-09 LAB — HGB A1C W/O EAG: Hgb A1c MFr Bld: 5.8 % — ABNORMAL HIGH (ref 4.8–5.6)

## 2020-03-09 LAB — MULTIPLE MYELOMA PANEL, SERUM
Albumin SerPl Elph-Mcnc: 3.8 g/dL (ref 2.9–4.4)
Albumin/Glob SerPl: 1.2 (ref 0.7–1.7)
Alpha 1: 0.3 g/dL (ref 0.0–0.4)
Alpha2 Glob SerPl Elph-Mcnc: 0.9 g/dL (ref 0.4–1.0)
B-Globulin SerPl Elph-Mcnc: 1.2 g/dL (ref 0.7–1.3)
Gamma Glob SerPl Elph-Mcnc: 1.1 g/dL (ref 0.4–1.8)
Globulin, Total: 3.4 g/dL (ref 2.2–3.9)
IgA/Immunoglobulin A, Serum: 340 mg/dL (ref 87–352)
IgG (Immunoglobin G), Serum: 969 mg/dL (ref 586–1602)
IgM (Immunoglobulin M), Srm: 99 mg/dL (ref 26–217)
Total Protein: 7.2 g/dL (ref 6.0–8.5)

## 2020-03-09 LAB — VITAMIN D 25 HYDROXY (VIT D DEFICIENCY, FRACTURES): Vit D, 25-Hydroxy: 47.7 ng/mL (ref 30.0–100.0)

## 2020-03-09 LAB — B. BURGDORFI ANTIBODIES: Lyme IgG/IgM Ab: <0.91 {ISR} (ref 0.00–0.90)

## 2020-03-09 LAB — CK: Total CK: 40 U/L (ref 32–182)

## 2020-03-09 LAB — HIV ANTIBODY (ROUTINE TESTING W REFLEX): HIV Screen 4th Generation wRfx: NONREACTIVE

## 2020-03-09 MED ORDER — DULOXETINE HCL 60 MG PO CPEP
60.0000 mg | ORAL_CAPSULE | Freq: Every day | ORAL | 11 refills | Status: DC
Start: 2020-03-09 — End: 2020-04-01

## 2020-03-09 NOTE — Progress Notes (Signed)
No chief complaint on file.   HISTORICAL  Amanda Cordova is a 59 year old female, seen in request by her podiatrist [name], [name], DPM and primary care physician Dr. [name], [name] for evaluation of worsening bilateral lower extremity paresthesia, initial evaluation was on March 05, 2020.  I reviewed and summarized the referring note. PMHx Left acoustic neuroma, presented with left hearing loss, vertigo, had surgery at hospital in [place] Probable optic neuritis Hospital admission to [facility] in September 2019 for left eye pain, blurry vision, was diagnosed with possible left optic neuritis, treated with a course of IV Solu-Medrol, I personally reviewed MRI of the brain, orbits with without contrast September 2019, no acute intracranial abnormality, status post left retrosigmoid craniotomy, no residual or recurrent tumor, mild to moderate chronic small vessel disease, atypical for classic demyelination, she still complains of bilateral blurry vision. Hypertension  She stepped on the curb wrong in 2019, had forceful right ankle inversion, suffered to right lateral ankle fracture, wearing boots for extended period of time, since then, she noticed intermittent numbness involving bilateral lower extremity, right much worse than left, she described paresthesia at the heel, and bottom of her feet, spreading to lateral part of her foot, lateral leg on the right side, left side is mainly involving her toes, and top of her toes.  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  ALLERGIES: Allergies  Allergen Reactions  . Gabapentin Anaphylaxis and Swelling    Throat and tongue swells Throat and tongue swells  . Tramadol Swelling    Throat and hands  . Lisinopril Nausea Only    HOME MEDICATIONS: Current Outpatient Medications  Medication Sig Dispense Refill  . albuterol (VENTOLIN HFA) 108 (90 Base) MCG/ACT inhaler INHALE 2 PUFFS INTO THE LUNGS EVERY 6 HOURS AS NEEDED FOR WHEEZING OR SHORTNESS OF BREATH 54 g 1  . ALPRAZolam (XANAX) 0.25 MG tablet TAKE 1 TABLET(0.25 MG) BY MOUTH DAILY AS NEEDED FOR ANXIETY OR SLEEP 30 tablet 2  . amLODipine (NORVASC) 10 MG tablet Take 0.5 tablets (5 mg total) by mouth daily. 90 tablet 3  . aspirin EC 81 MG tablet Take 1 tablet (81 mg total) by mouth daily.    . budesonide-formoterol (SYMBICORT) 160-4.5 MCG/ACT inhaler Inhale 2 puffs into the lungs 2 (two) times daily. 1 each 3  . DULoxetine (CYMBALTA) 60 MG capsule Take 1 capsule (60 mg total) by mouth daily. 30 capsule 12  . meloxicam (MOBIC) 15 MG tablet Take 1 tablet (15 mg total) by mouth daily. 30 tablet 3  . Multiple Vitamins-Minerals (CENTRUM SILVER PO) Take 1 tablet by mouth daily.    . Vitamin D, Ergocalciferol, (DRISDOL) 50000 units CAPS capsule Take 50,000 Units by mouth every 7 (seven) days.      No current facility-administered medications for this visit.    PAST MEDICAL HISTORY:  Past Medical History:  Diagnosis Date  . Acoustic neuroma ([place])   . CAD (coronary artery disease)   . COPD (chronic obstructive pulmonary disease) ([place])   . Hyperlipemia   . Hypertension   . Neuropathy   . Optic neuritis     PAST SURGICAL HISTORY: Past Surgical History:  Procedure  Laterality Date  . ABDOMINAL HYSTERECTOMY    . BREAST EXCISIONAL BIOPSY Right 2000   benign  . cerical fusion  1995  . CHOLECYSTECTOMY    . 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 HYSTERECTOMY ABDOMINAL WITH SALPINGECTOMY      FAMILY HISTORY: Family History  Problem Relation Age of Onset  . Breast cancer Sister   . Diabetes Sister   . COPD Mother   . Heart failure Mother   . Heart attack Father   . Diabetes Maternal Aunt     SOCIAL HISTORY: Social History   Socioeconomic History  . Marital status: Married    Spouse name: Not on file  . Number of children: 4  . Years of education: 53  . Highest education level: High school graduate  Occupational History  . Occupation: Disabled  Tobacco Use  . Smoking status: Former Smoker    Packs/day: 1.00    Years: 35.00    Pack years: 35.00    Types: Cigarettes    Quit date: 01/09/2018    Years since quitting: 2.1  . Smokeless tobacco: Never Used  Substance and Sexual Activity  . Alcohol use: Not Currently  . Drug use: Never  . Sexual activity: Not on file  Other Topics Concern  . Not on file  Social History Narrative   Lives at home with her husband and her great-grandson.   Right-handed.   2-3 cups caffeine per day.    Social Determinants of Health   Financial Resource Strain:   . Difficulty of Paying Living Expenses: Not on file  Food Insecurity:   . Worried About Charity fundraiser in the Last Year: Not on file  . Ran Out of Food in the Last Year: Not on file  Transportation Needs:   . Lack of Transportation (Medical): Not on file  . Lack of Transportation (Non-Medical): Not on file  Physical Activity:   . Days of Exercise per Week: Not on file  . Minutes of Exercise per Session: Not on file  Stress:   . Feeling of Stress : Not on file  Social Connections:   . Frequency of Communication with Friends and Family: Not on file  . Frequency of Social Gatherings with Friends and Family: Not on file  .  Attends Religious Services: Not on file  . Active Member of Clubs or Organizations: Not on file  . Attends Archivist Meetings: Not on file  . Marital Status: Not on file  Intimate Partner Violence:   . Fear of Current or Ex-Partner: Not on file  . Emotionally Abused: Not on file  . Physically Abused: Not on file  . Sexually Abused: Not on file     PHYSICAL EXAM   There were no vitals filed for this visit. Not recorded     There is no height or weight on file to calculate BMI.  PHYSICAL EXAMNIATION:  Gen: NAD, conversant, well nourised, well groomed             NEUROLOGICAL EXAM:  MENTAL STATUS: Speech/cognition: Awake, alert, oriented to history taking and casual conversation  CRANIAL NERVES: CN II: Visual fields  are full to confrontation. Pupils are round equal and briskly reactive to light. CN III, IV, VI: extraocular movement are normal. No ptosis. CN V: Facial sensation is intact to light touch CN VII: Face is symmetric with normal eye closure  CN VIII: Decreased hearing at left ear, weber's sign to the right side CN IX, X: Phonation is normal. CN XI: Head turning and shoulder shrug are intact  MOTOR: There is no pronator drift of out-stretched arms. Muscle bulk and tone are normal. Muscle strength is normal.  REFLEXES: Reflexes are 2+ and symmetric at the biceps, triceps, knees, and ankles. Plantar responses are flexor.  SENSORY: Mildly length dependent decreased toe vibratory sensation, light touch, pinprick to mid shin level.  COORDINATION: There is no trunk or limb dysmetria noted.  GAIT/STANCE: Need to push-up to get up from seated position, wide-based, unsteady, Positive Romberg signs   DIAGNOSTIC DATA (LABS, IMAGING, TESTING) - I reviewed patient records, labs, notes, testing and imaging myself where available.   ASSESSMENT AND PLAN  Amanda Cordova is a 59 y.o. 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 Ph.D.  [facility] [address] [place], [place] [address] Ph: [phone] Fax: [phone]  CC:  Garrel Ridgel, DPM [address] [place],  [place] [address]  [name], [name], MD

## 2020-03-09 NOTE — Procedures (Signed)
Full Name: Amanda Cordova Gender: Female MRN #: [id number] Date of Birth: Dec 26, 1960    Visit Date: 03/09/2020 08:53 Age: 59 Years Examining Physician: Marcial Pacas, MD  Referring Physician: Marcial Pacas, MD History: 59 year old female, with history of left acoustic neuroma resection surgery, residual gait abnormality, presented with 2 years history of worsening bilateral feet paresthesia, unsteady gait since her right ankle fracture in 2019   Summary of the test: Nerve conduction study:  Bilateral sural, superficial peroneal sensory responses were absent.  Right ulnar sensory and motor responses were normal.  Electromyography:  Selected needle examination of the right lower extremity muscles and right lumbar paraspinal muscles were normal.  Conclusion: This is an abnormal study.  There is electrodiagnostic evidence of mildly length dependent axonal peripheral neuropathy, sensory predominant.  There is no evidence of right lumbosacral radiculopathy.    ------------------------------- Marcial Pacas, M.D. Ph.D.  [facility] [address], [place] [address] Tel: [phone] Fax: [phone]  Verbal informed consent was obtained from the patient, patient was informed of potential risk of procedure, including bruising, bleeding, hematoma formation, infection, muscle weakness, muscle pain, numbness, among others.         Krupp    Nerve / Sites Muscle Latency Ref. Amplitude Ref. Rel Amp Segments Distance Velocity Ref. Area    ms ms mV mV %  cm m/s m/s mVms  R Ulnar - ADM     Wrist ADM 3.1 ?3.3 10.4 ?6.0 100 Wrist - ADM 7   27.7     B.Elbow ADM 6.0  9.9  95.3 B.Elbow - Wrist 20 70 ?49 27.4     A.Elbow ADM 7.5  9.4  94.4 A.Elbow - B.Elbow 10 68 ?49 26.7         A.Elbow - Wrist 0 0    R Peroneal - EDB     Ankle EDB 5.0 ?6.5 4.9 ?2.0 100 Ankle - EDB 9   20.4     Fib head EDB 10.0  2.6  53.7 Fib head - Ankle 22 44 ?44 13.5     Pop fossa EDB 12.3  4.3   164 Pop fossa - Fib head 10 44 ?44 19.6         Pop fossa - Ankle      L Peroneal - EDB     Ankle EDB 4.9 ?6.5 7.1 ?2.0 100 Ankle - EDB 9   27.9     Fib head EDB 10.6  5.5  77.9 Fib head - Ankle 25 44 ?44 22.8     Pop fossa EDB 12.9  5.9  108 Pop fossa - Fib head 10 44 ?44 25.6         Pop fossa - Ankle      R Tibial - AH     Ankle AH 4.7 ?5.8 13.7 ?4.0 100 Ankle - AH 9   34.1     Pop fossa AH 13.0  5.3  38.6 Pop fossa - Ankle 34 41 ?41 18.2  L Tibial - AH     Ankle AH 4.8 ?5.8 12.2 ?4.0 100 Ankle - AH 9   30.3     Pop fossa AH 13.1  10.3  84.2 Pop fossa - Ankle 34 41 ?41 30.8                SNC    Nerve / Sites Rec. Site Peak Lat Ref.  Amp Ref. Segments Distance    ms ms V  V  cm  R Sural - Ankle (Calf)     Calf Ankle NR ?4.4 NR ?6 Calf - Ankle 14  L Sural - Ankle (Calf)     Calf Ankle NR ?4.4 NR ?6 Calf - Ankle 14  R Superficial peroneal - Ankle     Lat leg Ankle NR ?4.4 NR ?6 Lat leg - Ankle 14  L Superficial peroneal - Ankle     Lat leg Ankle NR ?4.4 NR ?6 Lat leg - Ankle 14  R Ulnar - Orthodromic, (Dig V, Mid palm)     Dig V Wrist 3.0 ?3.1 7 ?5 Dig V - Wrist 57               F  Wave    Nerve F Lat Ref.   ms ms  R Tibial - AH 53.6 ?56.0  L Tibial - AH 49.4 ?56.0  R Ulnar - ADM 28.1 ?32.0           EMG Summary Table    Spontaneous MUAP Recruitment  Muscle IA Fib PSW Fasc Other Amp Dur. Poly Pattern  R. Tibialis anterior Normal None None None _______ Normal Normal Normal Normal  R. Tibialis posterior Normal None None None _______ Normal Normal Normal Normal  R. Gastrocnemius (Medial head) Normal None None None _______ Normal Normal Normal Normal  R. Peroneus longus Normal None None None _______ Normal Normal Normal Normal  R. Vastus lateralis Normal None None None _______ Normal Normal Normal Normal  R. Lumbar paraspinals (low) Normal None None None _______ Normal Normal Normal Normal  R. Lumbar paraspinals (mid) Normal None None None _______ Normal Normal Normal  Normal

## 2020-03-10 ENCOUNTER — Encounter: Payer: Self-pay | Admitting: Podiatry

## 2020-03-10 DIAGNOSIS — G5793 Unspecified mononeuropathy of bilateral lower limbs: Secondary | ICD-10-CM

## 2020-03-14 DIAGNOSIS — G4733 Obstructive sleep apnea (adult) (pediatric): Secondary | ICD-10-CM | POA: Diagnosis not present

## 2020-03-22 ENCOUNTER — Ambulatory Visit
Admission: RE | Admit: 2020-03-22 | Discharge: 2020-03-22 | Disposition: A | Discharge status: Home / Self Care | Payer: Medicare HMO | Source: Ambulatory Visit | Attending: Neurology | Admitting: Neurology

## 2020-03-22 ENCOUNTER — Other Ambulatory Visit: Payer: Self-pay

## 2020-03-22 DIAGNOSIS — R269 Unspecified abnormalities of gait and mobility: Secondary | ICD-10-CM | POA: Diagnosis not present

## 2020-03-22 DIAGNOSIS — R202 Paresthesia of skin: Secondary | ICD-10-CM

## 2020-03-22 MED ORDER — GADOBENATE DIMEGLUMINE 529 MG/ML IV SOLN
20.0000 mL | Freq: Once | INTRAVENOUS | Status: AC | PRN
  Administered 2020-03-22: 20 mL via INTRAVENOUS

## 2020-03-24 ENCOUNTER — Telehealth: Payer: Self-pay | Admitting: Neurology

## 2020-03-24 ENCOUNTER — Other Ambulatory Visit: Payer: Self-pay | Admitting: Family Medicine

## 2020-03-24 DIAGNOSIS — I7300 Raynaud's syndrome without gangrene: Secondary | ICD-10-CM

## 2020-03-24 DIAGNOSIS — I10 Essential (primary) hypertension: Secondary | ICD-10-CM

## 2020-03-24 NOTE — Telephone Encounter (Signed)
I called the patient and provided her with the MRI results below. She would like an earlier appt to come in for review of findings. She has been rescheduled to 04/01/20.

## 2020-03-24 NOTE — Telephone Encounter (Signed)
IMPRESSION: This MRI of the brain with and without contrast shows the following: 1.   Scattered T2/FLAIR hyperintense foci in the subcortical deep white matter most consistent with chronic microvascular ischemic change, more than typical for age but stable compared to the 2019 MRI. 2.   Mild sequela of left craniotomy, likely from remote internal auditory canal surgery 3.   Normal enhancement pattern and no acute findings. IMPRESSION: This MRI of the cervical spine shows the following: 1.   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  Additionally at this level, there is moderately severe right and moderate left foraminal narrowing with probable right C4 nerve root compression. 2.    Mild spinal stenosis is also noted at C2-C3 but there is no nerve root compression at this level. 3.    Minimal anterolisthesis but no spinal stenosis or nerve root compression at C7-T1. 4.    Solid interbody fusion from C4-C6 and anterior and posterior fusion associated with metal artifact at C6-C7.  There does not appear to be nerve root compression at any of these levels. 5.    Normal enhancement pattern.  Please call patient, MRI of the brain showed scattered supratentorium small vessel disease, no acute abnormality, evidence of left craniotomy,  MRI of cervical spine showed solid fusion from C4-6, anterior and posterior fusion at C6-7, there is evidence of moderate spinal stenosis at the level above previous fusion, C3 and 4, with abnormal signal change noted at the spinal cord adjacent to C4 vertebral body, consistent with compressive myelopathic signal, there is also evidence of moderately severe right and moderate left foraminal stenosis at this level  The described abnormal cervical findings would likely explain her complaints of worsening lower extremity  numbness, gait abnormality  We will review films at her next follow-up visit.

## 2020-03-25 ENCOUNTER — Encounter: Payer: Self-pay | Admitting: Family Medicine

## 2020-03-25 ENCOUNTER — Other Ambulatory Visit: Payer: Self-pay

## 2020-03-25 ENCOUNTER — Ambulatory Visit (INDEPENDENT_AMBULATORY_CARE_PROVIDER_SITE_OTHER): Payer: Medicare HMO | Admitting: Family Medicine

## 2020-03-25 VITALS — BP 136/80 | HR 76 | Resp 16 | Ht 64.0 in | Wt 246.0 lb

## 2020-03-25 DIAGNOSIS — I7300 Raynaud's syndrome without gangrene: Secondary | ICD-10-CM | POA: Diagnosis not present

## 2020-03-25 DIAGNOSIS — I10 Essential (primary) hypertension: Secondary | ICD-10-CM

## 2020-03-25 DIAGNOSIS — N644 Mastodynia: Secondary | ICD-10-CM | POA: Diagnosis not present

## 2020-03-25 DIAGNOSIS — R202 Paresthesia of skin: Secondary | ICD-10-CM | POA: Diagnosis not present

## 2020-03-25 DIAGNOSIS — I2510 Atherosclerotic heart disease of native coronary artery without angina pectoris: Secondary | ICD-10-CM

## 2020-03-25 DIAGNOSIS — J432 Centrilobular emphysema: Secondary | ICD-10-CM

## 2020-03-25 MED ORDER — FLUTICASONE-SALMETEROL 250-50 MCG/DOSE IN AEPB: 1.0000 | INHALATION_SPRAY | Freq: Two times a day (BID) | RESPIRATORY_TRACT | 3 refills | Status: DC

## 2020-03-25 MED ORDER — PRAVASTATIN SODIUM 10 MG PO TABS: 10.0000 mg | ORAL_TABLET | Freq: Every day | ORAL | 0 refills | Status: DC

## 2020-03-25 NOTE — Patient Instructions (Signed)
A few things to remember from today's visit:   Essential hypertension  Primary hypertension  Breast tenderness in female - Plan: MM DIAG BREAST TOMO BILATERAL  Raynaud's phenomenon without gangrene  Coronary artery disease involving native coronary artery of native heart without angina pectoris - Plan: pravastatin (PRAVACHOL) 10 MG tablet  Centrilobular emphysema (HCC) - Plan: Fluticasone-Salmeterol (ADVAIR DISKUS) 250-50 MCG/DOSE AEPB  If you need refills please call your pharmacy. Do not use My Chart to request refills or for acute issues that need immediate attention.  Stop Symbicort. Try Advair  2 times daily. Tyr Pravastatin lower dose and a few times per week. No changes is rest. Diagnostic mammogram is going to be arranged. Continue Cymbalta.   Please be sure medication list is accurate. If a new problem present, please set up appointment sooner than planned today.

## 2020-03-25 NOTE — Progress Notes (Signed)
HPI: [name] is a 59 y.o. female, who is here today for 3 months follow up.  She was last seen on 12/25/2019. Since her last visit she has seen her cardiologist.  According to pt Amlodipine was increased from 5 mg to 10 mg and because side effects, she was instructed to back to 5 mg. Home BP's 130's/70's.  Negative for severe/frequent headache, visual changes, chest pain,palpitation,focal weakness, or edema.  Lab Results  Component Value Date   CREATININE 0.73 11/13/2019   BUN 14 11/13/2019   NA 143 11/13/2019   K 4.3 11/13/2019   CL 108 11/13/2019   CO2 28 11/13/2019   Raynaud's phenomenon: Amlodipine has helped with toes cyanosis and cold sensation. Has not helped much with numbness/burning toes sensation.  HLD: She has not tolerated statin medications. + CAD.  Recently started on Pravastatin 80 mg, stopped because LE pain and balance issues that interfered with daily living activities and with sleep.  Lab Results  Component Value Date   CHOL 150 12/30/2019   HDL 38 (L) 12/30/2019   LDLCALC 91 12/30/2019   TRIG 114 12/30/2019   CHOLHDL 3.9 12/30/2019    She has also seen her neurologies. EMG and brain MRI were done. Neuro appt was moved from 04/2020 to 04/01/20 because some brain MRI abnormalities. Paresthesias:She was started on Cymbalta 60 mg , which she just started 2 days ago. Problem has been stable. EMG: This is an abnormal study.  There is electrodiagnostic evidence of mildly length dependent axonal peripheral neuropathy, sensory predominant.  There is no evidence of right lumbosacral radiculopathy.  Brain and cervical MRI 03/22/20:  1.   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  Additionally at this level, there is moderately severe right and moderate left foraminal narrowing with  probable right C4 nerve root compression. 2.    Mild spinal stenosis is also noted at C2-C3 but there is no nerve root compression at this level. 3.    Minimal anterolisthesis but no spinal stenosis or nerve root compression at C7-T1. 4.    Solid interbody fusion from C4-C6 and anterior and posterior fusion associated with metal artifact at C6-C7.  There does not appear to be nerve root compression at any of these levels. 5.    Normal enhancement pattern.  She just learned that another sister was Dx'ed with an aggressive form of breast cancer. Concerned because "weird" under left arm, "hurts", "annoy" sensation for about a month. She has not noted skin changes, masses,or nipple discharge.  She has 3 sisters with Hx of breast cancer. She had her last mammogram in 04/22/2019 [facility] 1.  She has not been exercising regularly, stopped walking when she was on statin.She was walking her dog 3 per day for an hour at time.  COPD:She is using Albuterol inh daily, sometimes bid due to SOB and wheezing. She is not using Symbicort, it causes burning chest sensation when breathing medication in.  Review of Systems  Constitutional: Positive for fatigue. Negative for activity change, appetite change and fever.  HENT: Negative for mouth sores, nosebleeds and sore throat.   Respiratory: Negative for cough.   Gastrointestinal: Negative for abdominal pain, nausea and vomiting.       Negative for changes in bowel habits.  Genitourinary: Negative for decreased urine volume, dysuria and hematuria.  Musculoskeletal: Positive  for gait problem.  Neurological: Negative for syncope and facial asymmetry.  Psychiatric/Behavioral: Negative for confusion.  Rest of ROS, see pertinent positives sand negatives in HPI  Current Outpatient Medications on File Prior to Visit  Medication Sig Dispense Refill  . albuterol (VENTOLIN HFA) 108 (90 Base) MCG/ACT inhaler INHALE 2 PUFFS INTO THE LUNGS EVERY 6 HOURS AS NEEDED FOR  WHEEZING OR SHORTNESS OF BREATH 54 g 1  . ALPRAZolam (XANAX) 0.25 MG tablet TAKE 1 TABLET(0.25 MG) BY MOUTH DAILY AS NEEDED FOR ANXIETY OR SLEEP 30 tablet 2  . amLODipine (NORVASC) 10 MG tablet Take 0.5 tablets (5 mg total) by mouth daily. 90 tablet 3  . aspirin EC 81 MG tablet Take 1 tablet (81 mg total) by mouth daily.    . DULoxetine (CYMBALTA) 60 MG capsule Take 1 capsule (60 mg total) by mouth daily. 30 capsule 11  . meloxicam (MOBIC) 15 MG tablet Take 1 tablet (15 mg total) by mouth daily. 30 tablet 3  . Multiple Vitamins-Minerals (CENTRUM SILVER PO) Take 1 tablet by mouth daily.    . Vitamin D, Ergocalciferol, (DRISDOL) 50000 units CAPS capsule Take 50,000 Units by mouth every 7 (seven) days.      No current facility-administered medications on file prior to visit.   Past Medical History:  Diagnosis Date  . Acoustic neuroma ([place])   . CAD (coronary artery disease)   . COPD (chronic obstructive pulmonary disease) ([place])   . Hyperlipemia   . Hypertension   . Neuropathy   . Optic neuritis    Allergies  Allergen Reactions  . Gabapentin Anaphylaxis and Swelling    Throat and tongue swells Throat and tongue swells  . Tramadol Swelling    Throat and hands  . Lisinopril Nausea Only    Social History   Socioeconomic History  . Marital status: Married    Spouse name: Not on file  . Number of children: 4  . Years of education: 62  . Highest education level: High school graduate  Occupational History  . Occupation: Disabled  Tobacco Use  . Smoking status: Former Smoker    Packs/day: 1.00    Years: 35.00    Pack years: 35.00    Types: Cigarettes    Quit date: 01/09/2018    Years since quitting: 2.2  . Smokeless tobacco: Never Used  Substance and Sexual Activity  . Alcohol use: Not Currently  . Drug use: Never  . Sexual activity: Not on file  Other Topics Concern  . Not on file  Social History Narrative   Lives at home with her husband and her great-grandson.    Right-handed.   2-3 cups caffeine per day.    Social Determinants of Health   Financial Resource Strain: Not on file  Food Insecurity: Not on file  Transportation Needs: Not on file  Physical Activity: Not on file  Stress: Not on file  Social Connections: Not on file    Vitals:   03/25/20 0712  BP: 136/80  Pulse: 76  Resp: 16  SpO2: 96%   Wt Readings from Last 3 Encounters:  03/25/20 246 lb (111.6 kg)  03/05/20 242 lb 8 oz (110 kg)  01/14/20 242 lb 6.4 oz (110 kg)   Body mass index is 42.23 kg/m.  Physical Exam Vitals and nursing note reviewed.  Constitutional:      General: She is not in acute distress.    Appearance: She is well-developed.  HENT:     Head: Normocephalic and atraumatic.  Mouth/Throat:     Mouth: Mucous membranes are moist.     Pharynx: Oropharynx is clear.  Eyes:     Conjunctiva/sclera: Conjunctivae normal.     Pupils: Pupils are equal, round, and reactive to light.  Cardiovascular:     Rate and Rhythm: Normal rate and regular rhythm.     Pulses:          Dorsalis pedis pulses are 2+ on the right side and 2+ on the left side.     Heart sounds: No murmur heard.   Pulmonary:     Effort: Pulmonary effort is normal. No respiratory distress.     Breath sounds: Normal breath sounds.  Chest:       Comments: Breast: Fibrocystic like changes. No skin changes,masses,or nipple discharge bilateral. Abdominal:     Palpations: Abdomen is soft. There is no hepatomegaly or mass.     Tenderness: There is no abdominal tenderness.  Lymphadenopathy:     Cervical: No cervical adenopathy.  Skin:    General: Skin is warm.     Findings: No erythema or rash.  Neurological:     Mental Status: She is alert and oriented to person, place, and time.     Cranial Nerves: No cranial nerve deficit.     Gait: Gait abnormal.  Psychiatric:     Comments: Well groomed, good eye contact.   ASSESSMENT AND PLAN:  [name] was seen today for 3  months follow-up.  Orders Placed This Encounter  Procedures  . MM DIAG BREAST TOMO BILATERAL    Breast tenderness in female ?Fibrocystic breast disease. Dx mammogram will be arranged.  Raynaud's phenomenon without gangrene Improved. Avoid exposure to trigger factors. Appropriate skin care.  Coronary artery disease involving native coronary artery of native heart without angina pectoris She has not tolerated statin meds well. We discussed CV benefits of statin. She agrees with trying a lower dose Pravastatin and if she doe snot tolerate daily she can try q 2 days or so. Continue Aspirin 81 mg daily.  -     pravastatin (PRAVACHOL) 10 MG tablet; Take 1 tablet (10 mg total) by mouth daily.  Centrilobular emphysema (HCC) Not well controlled. Side effects with Symbicort. She agrees with trying Advair 250-50 mcg bid. Continue Albuterol inh 2 puff q 4-6 hours prn.  -     Fluticasone-Salmeterol (ADVAIR DISKUS) 250-50 MCG/DOSE AEPB; Inhale 1 puff into the lungs 2 (two) times daily.  Paresthesia Brain MRI and EMG report reviewed. Continue Cymbalta 60 mg daily, some side effects. Appropriate skin care. Following with neuro.  Hypertension BP adequately controlled. Continue Amlodipine 5 mg daily. Low salt diet. Continue monitoring BP at home.  Morbid obesity ([place]) She understands the benefits of wt loss as well as adverse effects of obesity. Planning on resuming regular walking, caution with injuries. Consistency with healthy diet and with physical activity as tolerated.  Audrie Kuri G. [name], MD  [facility]. [place] office.   A few things to remember from today's visit:   Essential hypertension  Primary hypertension  Breast tenderness in female - Plan: MM DIAG BREAST TOMO BILATERAL  Raynaud's phenomenon without gangrene  Coronary artery disease involving native coronary artery of native heart without angina pectoris - Plan: pravastatin (PRAVACHOL) 10 MG  tablet  Centrilobular emphysema (HCC) - Plan: Fluticasone-Salmeterol (ADVAIR DISKUS) 250-50 MCG/DOSE AEPB  If you need refills please call your pharmacy. Do not use My Chart to request refills or for acute issues that need immediate  attention.  Stop Symbicort. Try Advair  2 times daily. Tyr Pravastatin lower dose and a few times per week. No changes is rest. Diagnostic mammogram is going to be arranged. Continue Cymbalta.   Please be sure medication list is accurate. If a new problem present, please set up appointment sooner than planned today.

## 2020-03-25 NOTE — Assessment & Plan Note (Addendum)
BP adequately controlled. Continue Amlodipine 5 mg daily. Low salt diet. Continue monitoring BP at home.

## 2020-04-01 ENCOUNTER — Other Ambulatory Visit: Payer: Self-pay

## 2020-04-01 ENCOUNTER — Ambulatory Visit: Payer: Medicare HMO | Admitting: Neurology

## 2020-04-01 ENCOUNTER — Encounter: Payer: Self-pay | Admitting: Neurology

## 2020-04-01 VITALS — BP 142/76 | HR 57 | Ht 64.0 in | Wt 242.5 lb

## 2020-04-01 DIAGNOSIS — Z86018 Personal history of other benign neoplasm: Secondary | ICD-10-CM | POA: Insufficient documentation

## 2020-04-01 DIAGNOSIS — R269 Unspecified abnormalities of gait and mobility: Secondary | ICD-10-CM

## 2020-04-01 DIAGNOSIS — I679 Cerebrovascular disease, unspecified: Secondary | ICD-10-CM

## 2020-04-01 DIAGNOSIS — N882 Stricture and stenosis of cervix uteri: Secondary | ICD-10-CM | POA: Diagnosis not present

## 2020-04-01 MED ORDER — DULOXETINE HCL 20 MG PO CPEP: 20.0000 mg | ORAL_CAPSULE | Freq: Every day | ORAL | 11 refills | Status: DC

## 2020-04-01 NOTE — Progress Notes (Signed)
Chief Complaint  Patient presents with  . Paresthesia/Gait Abnormality    She is here to review her cervical and brain MRI scans. She stopped duloxetine due to nausea and vomiting.    HISTORICAL  Amanda Cordova is a 59 year old female, seen in request by her podiatrist [name], [name], DPM and primary care physician Dr. [name], [name] for evaluation of worsening bilateral lower extremity paresthesia, initial evaluation was on March 05, 2020.  I reviewed and summarized the referring note. PMHx Left acoustic neuroma, presented with left hearing loss, vertigo, had surgery at hospital in [place] Probable optic neuritis Hospital admission to [facility] in September 2019 for left eye pain, blurry vision, was diagnosed with possible left optic neuritis, treated with a course of IV Solu-Medrol, I personally reviewed MRI of the brain, orbits with without contrast September 2019, no acute intracranial abnormality, status post left retrosigmoid craniotomy, no residual or recurrent tumor, mild to moderate chronic small vessel disease, atypical for classic demyelination, she still complains of bilateral blurry vision. Hypertension  She stepped on the curb wrong in 2019, had forceful right ankle inversion, suffered to right lateral ankle fracture, wearing boots for extended period of time, since then, she noticed intermittent numbness involving bilateral lower extremity, right much worse than left, she described paresthesia at the heel, and bottom of her feet, spreading to lateral part of her foot, lateral leg on the right side, left side is mainly involving her toes, and top of her toes.  She had gait abnormality since left acoustic neuroma surgery in 2011, there was no significant worsening  She also complains of intermittent bilateral first 3 finger paresthesia,  Lab evaluation in July 2021, normal B12 500, vitamin D 63, BMP, glucose was mildly elevated at 102, normal ESR,  C-reactive protein,  Update March 09, 2020: Patient return for electrodiagnostic study today, which showed evidence of mild length dependent axonal sensory predominant neuropathy  Laboratory November 2021: Elevated A1c 5.8, otherwise normal vitamin D, HIV, protein electrophoresis, ANA, CPK, RPR, Lyme titer,  UPDATE Apr 01 2020: She continue complains of right ankle pain, neck pain, gait abnormality, bilateral feet paresthesia, right worse than left  We personally reviewed MRI of the brain in December 2021, scattered deep white matter small vessel disease, left craniotomy, from remote internal auditory canal surgery, left cerebellar encephalomalacia  MRI of cervical spine showed solid fusion from C4-5, 6, abnormal cord signal adjacent to C4 bilaterally, C3-4 level, moderate spinal stenosis due to disc protrusion, ligamentum flavum hypertrophy, facet hypertrophy, uncovertebral spurring,  REVIEW OF SYSTEMS: Full 14 system review of systems performed and notable only for as above All other review of systems were negative.  ALLERGIES: Allergies  Allergen Reactions  . Gabapentin Anaphylaxis and Swelling    Throat and tongue swells Throat and tongue swells  . Tramadol Swelling    Throat and hands  . Duloxetine Nausea And Vomiting  . Lisinopril Nausea Only    HOME MEDICATIONS: Current Outpatient Medications  Medication Sig Dispense Refill  . albuterol (VENTOLIN HFA) 108 (90 Base) MCG/ACT inhaler INHALE 2 PUFFS INTO THE LUNGS EVERY 6 HOURS AS NEEDED FOR WHEEZING OR SHORTNESS OF BREATH 54 g 1  . ALPRAZolam (XANAX) 0.25 MG tablet TAKE 1 TABLET(0.25 MG) BY MOUTH DAILY AS NEEDED FOR ANXIETY OR SLEEP 30 tablet 2  . amLODipine (NORVASC) 10 MG tablet Take 0.5 tablets (5 mg total) by mouth daily. 90 tablet 3  . aspirin EC 81 MG tablet Take  1 tablet (81 mg total) by mouth daily.    . Fluticasone-Salmeterol (ADVAIR DISKUS) 250-50 MCG/DOSE AEPB Inhale 1 puff into the lungs 2 (two) times daily. 1  each 3  . Multiple Vitamins-Minerals (CENTRUM SILVER PO) Take 1 tablet by mouth daily.    . pravastatin (PRAVACHOL) 10 MG tablet Take 1 tablet (10 mg total) by mouth daily. 90 tablet 0  . Vitamin D, Ergocalciferol, (DRISDOL) 50000 units CAPS capsule Take 50,000 Units by mouth every 7 (seven) days.      No current facility-administered medications for this visit.    PAST MEDICAL HISTORY: Past Medical History:  Diagnosis Date  . Acoustic neuroma ([place])   . CAD (coronary artery disease)   . COPD (chronic obstructive pulmonary disease) ([place])   . Hyperlipemia   . Hypertension   . Neuropathy   . Optic neuritis     PAST SURGICAL HISTORY: Past Surgical History:  Procedure Laterality Date  . ABDOMINAL HYSTERECTOMY    . BREAST EXCISIONAL BIOPSY Right 2000   benign  . cerical fusion  1995  . CHOLECYSTECTOMY    . 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 HYSTERECTOMY ABDOMINAL WITH SALPINGECTOMY      FAMILY HISTORY: Family History  Problem Relation Age of Onset  . Breast cancer Sister   . Diabetes Sister   . Cancer Sister   . COPD Mother   . Heart failure Mother   . Heart attack Father   . Diabetes Maternal Aunt   . Cancer Sister   . Cancer Sister     SOCIAL HISTORY: Social History   Socioeconomic History  . Marital status: Married    Spouse name: Not on file  . Number of children: 4  . Years of education: 35  . Highest education level: High school graduate  Occupational History  . Occupation: Disabled  Tobacco Use  . Smoking status: Former Smoker    Packs/day: 1.00    Years: 35.00    Pack years: 35.00    Types: Cigarettes    Quit date: 01/09/2018    Years since quitting: 2.2  . Smokeless tobacco: Never Used  Substance and Sexual Activity  . Alcohol use: Not Currently  . Drug use: Never  . Sexual activity: Not on file  Other Topics Concern  . Not on file  Social History Narrative   Lives at home with her husband and her great-grandson.   Right-handed.    2-3 cups caffeine per day.    Social Determinants of Health   Financial Resource Strain: Not on file  Food Insecurity: Not on file  Transportation Needs: Not on file  Physical Activity: Not on file  Stress: Not on file  Social Connections: Not on file  Intimate Partner Violence: Not on file     PHYSICAL EXAM   Vitals:   04/01/20 0741  BP: (!) 142/76  Pulse: (!) 57  Weight: 242 lb 8 oz (110 kg)  Height: $Remove'5\' 4"'ipjtlHC$  (1.626 m)   Not recorded     Body mass index is 41.63 kg/m.  PHYSICAL EXAMNIATION:  Gen: NAD, conversant, well nourised, well groomed             NEUROLOGICAL EXAM:  MENTAL STATUS: Speech/cognition: Awake, alert, oriented to history taking and casual conversation  CRANIAL NERVES: CN II: Visual fields are full to confrontation. Pupils are round equal and briskly reactive to light. CN III, IV, VI: extraocular movement are normal. No ptosis. CN V: Facial sensation is intact to  light touch CN VII: Face is symmetric with normal eye closure  CN VIII: Decreased hearing at left ear, weber's sign to the right side CN IX, X: Phonation is normal. CN XI: Head turning and shoulder shrug are intact  MOTOR: Normal muscle tone, bulk, mild right ankle dorsiflexion weakness, also limited due to ankle weakness  REFLEXES: Reflexes are 3 and symmetric at the biceps, triceps, knees, and ankles. Plantar responses are flexor.  SENSORY: Mildly length dependent decreased toe vibratory sensation, light touch, pinprick to mid shin level.  COORDINATION: There is no trunk or limb dysmetria noted.  GAIT/STANCE: Need to push-up to get up from seated position, wide-based, unsteady, dragging right leg Positive Romberg signs   DIAGNOSTIC DATA (LABS, IMAGING, TESTING) - I reviewed patient records, labs, notes, testing and imaging myself where available.   ASSESSMENT AND PLAN  Amanda Cordova is a 59 y.o. 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, sensory predominant,   Her gait abnormality are likely multifactorial, including history of right ankle fracture, continued right ankle pain, left knee pain, overweight, deconditioning, residual deficit from cervical myelopathy, and vestibular dysfunction from previous left auditory canal surgery  Referral to physical therapy  Continue to address vascular risk factor, aspirin 81 mg daily  She has been using as needed NSAIDs, could not tolerate Cymbalta 60 mg due to GI side effect, will try low-dose 20 mg daily  Will return to clinic in 1 year, if there was no significant change, we do not have to repeat imaging study, previous presentation raised the possibility of multiple sclerosis based on abnormal MRI of the brain, but the location and morphology of the supratentorium lesions are most consistent with small vessel disease.  If she complains of significant change at next follow-up, may consider repeat MRI of the brain.  Marcial Pacas, M.D. Ph.D.  [facility] [address] [place], [place] [address] Ph: [phone] Fax: [phone]  CC:  Garrel Ridgel, DPM [address],  [place] [address]  [name], [name], MD

## 2020-04-09 ENCOUNTER — Encounter: Payer: Self-pay | Admitting: Podiatry

## 2020-04-09 ENCOUNTER — Other Ambulatory Visit: Payer: Self-pay | Admitting: Family Medicine

## 2020-04-09 ENCOUNTER — Ambulatory Visit: Payer: Medicare HMO | Admitting: Podiatry

## 2020-04-09 ENCOUNTER — Other Ambulatory Visit: Payer: Self-pay

## 2020-04-09 DIAGNOSIS — N644 Mastodynia: Secondary | ICD-10-CM

## 2020-04-09 DIAGNOSIS — G5793 Unspecified mononeuropathy of bilateral lower limbs: Secondary | ICD-10-CM | POA: Diagnosis not present

## 2020-04-09 NOTE — Progress Notes (Signed)
She presents today for follow-up of her pain in her feet.  The pain in her feet has not changed since I saw her last.  She states that she has been to neurology for her nerve conduction velocity exam which was abnormal and we referred her back to neurology for treatment and evaluation.  Through this treatment plan she was found to have problems in her brain her spine and her lower back.  She states that the neurologist only wanted to provide her with Cymbalta which makes her throw up she did reduce the dose but still is making her sick.  She has not heard from the neurologist as of yet regarding her physical therapy.  But was told to revisit [place].  Objective: Vital signs are stable she is alert oriented x3 she has no changes in her physical exam from previous evaluation.  Assessment: Pain from the spine and neuropathy in the feet and legs.  Plan: I would like to refer her to Dr. Davy Pique at [facility] neurosurgeons for evaluation and treatment.  She has bilateral lower extremity pain neurology says associated with her back.  They also said that she has axonal neuropathy.  I recommended that she call neurology and make sure that she tries physical therapy first and that she tries her lower dose of Cymbalta.  She will follow up with our office for routine foot care with Dr. Elisha Ponder

## 2020-04-15 ENCOUNTER — Encounter: Payer: Self-pay | Admitting: *Deleted

## 2020-04-21 ENCOUNTER — Ambulatory Visit: Payer: Medicare HMO | Attending: Neurology | Admitting: Physical Therapy

## 2020-04-21 ENCOUNTER — Other Ambulatory Visit: Payer: Self-pay

## 2020-04-21 DIAGNOSIS — M6281 Muscle weakness (generalized): Secondary | ICD-10-CM | POA: Insufficient documentation

## 2020-04-21 DIAGNOSIS — Z9181 History of falling: Secondary | ICD-10-CM | POA: Diagnosis not present

## 2020-04-21 DIAGNOSIS — R42 Dizziness and giddiness: Secondary | ICD-10-CM | POA: Diagnosis not present

## 2020-04-21 DIAGNOSIS — R2681 Unsteadiness on feet: Secondary | ICD-10-CM | POA: Insufficient documentation

## 2020-04-21 DIAGNOSIS — R2689 Other abnormalities of gait and mobility: Secondary | ICD-10-CM | POA: Insufficient documentation

## 2020-04-21 NOTE — Therapy (Signed)
[facility] [address] [place], [place], [address] Phone: [phone]   Fax:  [phone]  Physical Therapy Evaluation  Patient Details  Name: Amanda RegalKathleen Mary Cordova MRN: [id number] Date of Birth: 1960-09-06 Referring Provider (PT): Levert FeinsteinYan, Yijun, MD   Encounter Date: 04/21/2020   PT End of Session - 04/21/20 1027    Visit Number 1    Number of Visits 17    Date for PT Re-Evaluation 07/20/20   written for 60 day cert   Authorization Type [employer]    Progress Note Due on Visit 10    PT Start Time 0930    PT Stop Time 1015    PT Time Calculation (min) 45 min    Equipment Utilized During Treatment Gait belt    Activity Tolerance Patient tolerated treatment well    Behavior During Therapy [facility] for tasks assessed/performed           Past Medical History:  Diagnosis Date  . Acoustic neuroma (HCC)   . CAD (coronary artery disease)   . COPD (chronic obstructive pulmonary disease) (HCC)   . Hyperlipemia   . Hypertension   . Neuropathy   . Optic neuritis     Past Surgical History:  Procedure Laterality Date  . ABDOMINAL HYSTERECTOMY    . BREAST EXCISIONAL BIOPSY Right 2000   benign  . cerical fusion  1995  . CHOLECYSTECTOMY    . 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 HYSTERECTOMY ABDOMINAL WITH SALPINGECTOMY      There were no vitals filed for this visit.    Subjective Assessment - 04/21/20 0931    Subjective Reports her leg and ankles buckles when she is walking. Has been walking with a SPC since 2011. Reports some dizziness mainly when she is getting up. When she is walking she feels unsteady and a little bit of vertigo. Has had a couple falls, reports her legs give out, more so recently. Either when she is getting up or walking, reports that this has just been part of her life since the brain tumor. Notices balance is worse when she gets up after sitting too long. More fatigued and wobbly  towards the end of the day.    Pertinent History History of left acoustic neuroma, status post left retrosigmoid craniotomy, with residual gait abnormality in 2011, Cerebral small vessel disease, Cervical myelopathy, HLD, COPD, HTN, deaf in L ear, neuropathy.    Limitations Walking    How long can you walk comfortably? walks 45 minutes at least once a day.    Diagnostic tests per Dr. Zannie CoveYan's note on 04/01/20: MRI of the brain in December 2021, scattered deep white matter small vessel disease, left craniotomy, from remote internal auditory canal surgery, left cerebellar encephalomalacia, MRI of cervical spine showed solid fusion from C4-5, 6, abnormal cord signal adjacent to C4 bilaterally, C3-4 level, moderate spinal stenosis due to disc protrusion, ligamentum flavum hypertrophy, facet hypertrophy, uncovertebral spurring    Patient Stated Goals "not sure what PT is going to do for me because i've been like this for so long",  wants to work on balance/leg strength (doesn't want her legs to buckle)    Currently in Pain? Yes    Pain Score 4     Pain Location Foot    Pain Orientation Right;Left    Pain Descriptors / Indicators Burning   constant burning/numbness in B feet   Pain Type Chronic pain;Neuropathic pain    Aggravating Factors  depends on the day and what her body is going to do, at night, before bad weather              [facility] PT Assessment - 04/21/20 0941      Assessment   Medical Diagnosis hx of acoustic neuroma, neuropathy    Referring Provider (PT) Levert Feinstein, MD    Onset Date/Surgical Date 04/01/20   date of referral, balance started worsening last year (past couple months more stumbles and more falls)   Hand Dominance Right    Prior Therapy extensive PT after brain surgery in 2011      Precautions   Precautions Fall    Precaution Comments pt deaf in L ear, talk on R side      Balance Screen   Has the patient fallen in the past 6 months Yes    How many times? 3 or 4    Has  the patient had a decrease in activity level because of a fear of falling?  Yes    Is the patient reluctant to leave their home because of a fear of falling?  No      Home [employer] information [employer] residence    Living Arrangements Spouse/significant other   and shiba dog   Type of Home House    Home Access Stairs to enter    Entrance Stairs-Number of Steps 4 or 5    Entrance Stairs-Rails Can reach both    Home Layout One level    Home Equipment Ideal - single point;Other (comment)   no grab bars, but have thought about it   Additional Comments husband helps with cleaning, laundry      Prior Function   Level of Independence Independent with community mobility with device    Leisure enjoys crafting, reading      Sensation   Light Touch Impaired Detail    Light Touch Impaired Details --   able to detect B, when more distally (below B knees) pt reports it feeling like pins and needles   Proprioception Appears Intact   in B ankles for PF/DF   Additional Comments constant numbness/tingling bottom of feet      Coordination   Gross Motor Movements are Fluid and Coordinated Yes    Heel Shin Test able to perform, but reports pain in low back      ROM / Strength   AROM / PROM / Strength Strength      Strength   Strength Assessment Site Knee;Hip;Ankle    Right/Left Hip Left;Right    Right Hip Flexion 4-/5    Left Hip Flexion 4/5    Right/Left Knee Right;Left    Right Knee Flexion 5/5    Right Knee Extension 5/5    Left Knee Flexion 5/5    Left Knee Extension 5/5    Right/Left Ankle Right;Left    Right Ankle Dorsiflexion 5/5    Right Ankle Inversion 5/5    Right Ankle Eversion 4+/5    Left Ankle Dorsiflexion 5/5    Left Ankle Inversion 5/5    Left Ankle Eversion 5/5      Transfers   Transfers Sit to Stand;Stand to Sit    Sit to Stand 5: Supervision;4: Min guard;With upper extremity assist;From chair/3-in-1    Five time sit to stand comments  18.34 seconds using  BUE support from chair, during last 2 reps needing min guard in standing due to pt getting more unsteady/wobbly    Stand to  Sit 5: Supervision;4: Min guard;With upper extremity assist;To chair/3-in-1    Comments pt reports when she stands feels more wobbly, but not lightheadedness      Ambulation/Gait   Ambulation/Gait Yes    Ambulation/Gait Assistance 5: Supervision    Ambulation/Gait Assistance Details after performing DGI, pt with decr L foot clearance/toe drag when more fatigued    Ambulation Distance (Feet) --   clinic distances during eval   Assistive device Straight cane    Gait Pattern Step-through pattern;Decreased dorsiflexion - left;Poor foot clearance - left;Decreased dorsiflexion - right   B toe in (R>L)   Ambulation Surface Level;Indoor    Gait velocity 14.16 seconds = 2.31 ft/sec    Stairs Yes    Stairs Assistance 5: Supervision    Stairs Assistance Details (indicate cue type and reason) pt reports performing with step to pattern at home due to knee pain    Stair Management Technique Two rails;Alternating pattern;Forwards    Number of Stairs 4    Height of Stairs 6    Gait Comments pt reports at times at home she walks with no AD, holds onto furniture. when more fatigued, pt reports it is harder to pick up her feet (can either be the left or the right)      Standardized Balance Assessment   Standardized Balance Assessment Dynamic Gait Index      Dynamic Gait Index   Level Surface Mild Impairment    Change in Gait Speed Mild Impairment    Gait with Horizontal Head Turns Mild Impairment    Gait with Vertical Head Turns Mild Impairment    Gait and Pivot Turn Mild Impairment    Step Over Obstacle Mild Impairment    Step Around Obstacles Mild Impairment    Steps Mild Impairment    Total Score 16    DGI comment: 16/24, performing with SPC, pt reporting incr in dizziness after entirety of test, needing a brief seated rest break.      High Level Balance   High Level  Balance Comments mCTSIB: condition 1: 30 seconds, condition 2: 20 seconds with mild postural sway, condition 3: 30 seconds mild sway, condition 4: 2 seconds                      Objective measurements completed on examination: See above findings.               PT Education - 04/21/20 1026    Education Details clinical findings, POC, discussed potential of aquatic therapy for balance (pt reports that she potentially would be interested)- therapist to put in referral.    Person(s) Educated Patient    Methods Explanation    Comprehension Verbalized understanding            PT Short Term Goals - 04/21/20 1037      PT SHORT TERM GOAL #1   Title Pt will undergo further vestibular assessment - with STG and LTG to be written as appropriate. ALL STGS DUE 05/19/20    Time 4    Period Weeks    Status New    Target Date 05/19/20      PT SHORT TERM GOAL #2   Title Pt will improve DGI score to at least an 18/24 in order to demo decr fall risk.    Baseline 16/24 on 04/21/20    Time 4    Period Weeks    Status New      PT SHORT TERM GOAL #  3   Title Pt will improve condition 2 of mCTSIB to at least 30 seconds in order to demo improved balance with vision removed.    Baseline 20 seconds with mild postural sway.    Time 4    Period Weeks    Status New      PT SHORT TERM GOAL #4   Title Pt will perform 5x sit <> stand with BUE support in 16 seconds or less with supervision in order to demo decr fall risk.    Baseline needing min guard during last 2 reps due to imbalance. 18.34 seconds    Time 4    Period Weeks    Status New             PT Long Term Goals - 04/21/20 1038      PT LONG TERM GOAL #1   Title Vestibular goal to be written as appropriate. ALL LTGS DUE 06/16/20    Time 8    Period Weeks    Status New    Target Date 06/16/20      PT LONG TERM GOAL #2   Title Pt will be independent with final HEP in order to build upon functional gains made in  therapy.    Time 8    Period Weeks    Status New      PT LONG TERM GOAL #3   Title Pt will improve condition 4 of mCTSIB to at least 8 seconds in order to demo improved vestibular input for balance.    Baseline 2 seconds on 04/21/20    Time 8    Period Weeks    Status New      PT LONG TERM GOAL #4   Title Pt will improve DGI score to at least an 20/24 in order to demo decr fall risk.    Baseline 16/24    Time 8    Period Weeks    Status New      PT LONG TERM GOAL #5   Title Pt will improve gait speed to at least 2.7 ft/sec in order to demo improved gait efficiency for community mobility.    Baseline 2.31 ft/sec    Time 8    Period Weeks    Status New      Additional Long Term Goals   Additional Long Term Goals Yes      PT LONG TERM GOAL #6   Title Pt will ambulate at least 500' outdoors over paved surfaces with [facility] with supervision and perform a curb with supervision in order to demo improved community mobility.    Time 8    Period Weeks    Status New                  Plan - 04/21/20 1039    Clinical Impression Statement Patient is a 60 year old female referred to Neuro OPPT for imbalance/gait abnormality. Pt's PMH is significant for: History of left acoustic neuroma, status post left retrosigmoid craniotomy, with residual gait abnormality in 2011, Cerebral small vessel disease, Cervical myelopathy, HLD, COPD, HTN, neuropathy. Per Dr. 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  Pt has noticed a worsening in balance in the past few months and has had a couple falls (either when she is getting up or walking). Has noticed some foot drag when she is walking due to fatigue. The following deficits were present during the exam:  decr BLE strength, imbalance, impaired vestibular/somatosensory input for  balance, reports of dizziness (after turning/performing head motions during gait), impaired sensation, decr endurance, B foot neuropathic pain.  Based on DGI, pt is at an incr risk for falls. Pt's gait speed with SPC indicates that pt is a limited community ambulator. Pt would benefit from skilled PT to address these impairments and functional limitations to maximize functional mobility independence    Personal Factors and Comorbidities Comorbidity 3+;Past/Current Experience;Time since onset of injury/illness/exacerbation    Comorbidities History of left acoustic neuroma, status post left retrosigmoid craniotomy, with residual gait abnormality in 2011, Cerebral small vessel disease, Cervical myelopathy, HLD, COPD, HTN, neuropathy    Examination-Activity Limitations Stand;Transfers;Stairs;Locomotion Level    Examination-Participation Restrictions Cleaning;Community Activity    Stability/Clinical Decision Making Evolving/Moderate complexity    Clinical Decision Making Moderate    Rehab Potential Good    PT Frequency 2x / week    PT Duration 8 weeks    PT Treatment/Interventions ADLs/Self Care Home Management;Aquatic Therapy;Canalith Repostioning;DME Instruction;Gait training;Stair training;Functional mobility training;Therapeutic activities;Therapeutic exercise;Balance training;Neuromuscular re-education;Patient/family education;Passive range of motion;Vestibular    PT Next Visit Plan perform vestibular assessment and write STG/LTG as appropriate. initiate HEP (vestibular and balance). balance with vision removed. wonder if pt might benefit from foot up brace when more fatigued.    Recommended Other Services aquatic therapy.    Consulted and Agree with Plan of Care Patient           Patient will benefit from skilled therapeutic intervention in order to improve the following deficits and impairments:  Abnormal gait,Decreased balance,Decreased activity tolerance,Difficulty walking,Decreased strength,Dizziness,Impaired sensation,Pain  Visit Diagnosis: Unsteadiness on feet  Dizziness and giddiness  Other abnormalities of gait and  mobility  History of falling  Muscle weakness (generalized)     Problem List Patient Active Problem List   Diagnosis Date Noted  . Cerebral vascular disease 04/01/2020  . History of acoustic neuroma 04/01/2020  . Cervical stenosis (uterine cervix) 04/01/2020  . Paresthesia 03/05/2020  . Gait abnormality 03/05/2020  . Raynaud's phenomenon without gangrene 12/25/2019  . Emphysema of lung (HCC) 07/09/2019  . CAD (coronary artery disease) 06/19/2019  . History of craniotomy 06/19/2019  . Morbid obesity (HCC) 06/19/2019  . Unspecified optic neuritis 01/25/2018  . Hypertension 01/10/2018  . Optic neuritis, left 01/09/2018  . Cervical post-laminectomy syndrome 05/09/2016  . Cervical myofascial pain syndrome 05/09/2016  . DDD (degenerative disc disease), cervical 05/09/2016  . Cervical spondylosis without myelopathy 05/09/2016    Drake Leach, PT, DPT  04/21/2020, 10:56 AM  [facility] [address] [place], [place], [address] Phone: [phone]   Fax:  [phone]  Name: Jalexa Pifer MRN: [id number] Date of Birth: 12/03/1960

## 2020-04-24 ENCOUNTER — Encounter: Payer: Self-pay | Admitting: Physical Therapy

## 2020-04-24 ENCOUNTER — Other Ambulatory Visit: Payer: Self-pay

## 2020-04-24 ENCOUNTER — Ambulatory Visit: Payer: Medicare HMO | Admitting: Physical Therapy

## 2020-04-24 DIAGNOSIS — R42 Dizziness and giddiness: Secondary | ICD-10-CM | POA: Diagnosis not present

## 2020-04-24 DIAGNOSIS — Z9181 History of falling: Secondary | ICD-10-CM

## 2020-04-24 DIAGNOSIS — R2681 Unsteadiness on feet: Secondary | ICD-10-CM

## 2020-04-24 DIAGNOSIS — R2689 Other abnormalities of gait and mobility: Secondary | ICD-10-CM | POA: Diagnosis not present

## 2020-04-24 DIAGNOSIS — M6281 Muscle weakness (generalized): Secondary | ICD-10-CM | POA: Diagnosis not present

## 2020-04-24 NOTE — Therapy (Signed)
[facility] [address] [place], [place], [address] Phone: [phone]   Fax:  [phone]  Physical Therapy Treatment  Patient Details  Name: Amanda Cordova MRN: [id number] Date of Birth: 09-29-1960 Referring Provider (PT): Marcial Pacas, MD   Encounter Date: 04/24/2020   PT End of Session - 04/24/20 0832    Visit Number 2    Number of Visits 17    Date for PT Re-Evaluation 06/20/20   written for 60 day [place] [employer]- 10th visit PN and authorization required    Progress Note Due on Visit 10    PT Start Time 0720    PT Stop Time 0804    PT Time Calculation (min) 44 min    Activity Tolerance Patient tolerated treatment well    Behavior During Therapy [facility] for tasks assessed/performed           Past Medical History:  Diagnosis Date  . Acoustic neuroma ([place])   . CAD (coronary artery disease)   . COPD (chronic obstructive pulmonary disease) ([place])   . Hyperlipemia   . Hypertension   . Neuropathy   . Optic neuritis     Past Surgical History:  Procedure Laterality Date  . ABDOMINAL HYSTERECTOMY    . BREAST EXCISIONAL BIOPSY Right 2000   benign  . cerical fusion  1995  . CHOLECYSTECTOMY    . 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 HYSTERECTOMY ABDOMINAL WITH SALPINGECTOMY      There were no vitals filed for this visit.   Subjective Assessment - 04/24/20 0724    Subjective Today is pretty good day; having a little pain in the back of the neck and feet.   No dizziness sitting down.    Pertinent History History of left acoustic neuroma, status post left retrosigmoid craniotomy, with residual gait abnormality in 2011, Cerebral small vessel disease, Cervical myelopathy, HLD, COPD, HTN, deaf in L ear, neuropathy.    Limitations Walking    How long can you walk comfortably? walks 45 minutes at least once a day.    Diagnostic tests per Dr. Rhea Belton note on 04/01/20: MRI of the  brain in December 2021, scattered deep white matter small vessel disease, left craniotomy, from remote internal auditory canal surgery, left cerebellar encephalomalacia, MRI of cervical spine showed solid fusion from C4-5, 6, abnormal cord signal adjacent to C4 bilaterally, C3-4 level, moderate spinal stenosis due to disc protrusion, ligamentum flavum hypertrophy, facet hypertrophy, uncovertebral spurring    Patient Stated Goals "not sure what PT is going to do for me because i've been like this for so long",  wants to work on balance/leg strength (doesn't want her legs to buckle)    Currently in Pain? Yes    Pain Score 4     Pain Location Foot    Pain Orientation Right;Left    Pain Descriptors / Indicators Burning    Pain Type Chronic pain;Neuropathic pain                   Vestibular Assessment - 04/24/20 0726      Symptom Behavior   Subjective history of current problem Is with her every day since surgery; feels like she is off balance, has had multiple falls.  Reports having increase in visual blurring even with new glasses - constant.  Deaf in L ear, feels like it is harder to hear in R ear but it may be because of masks.  Sensitive to loud noises.  Does have headaches.  Does report having some spinning when she rolls over and sits up; this is brand new - started a couple months ago.  Tinnitus has increased in L side    Type of Dizziness  Imbalance;Spinning    Frequency of Dizziness daily    Duration of Dizziness imbalance is constant; spinning is intermittent    Symptom Nature Motion provoked    Aggravating Factors Forward bending;Sit to stand;Walking in a crowd;Turning body quickly;Turning head quickly;Rolling to right;Activity in general    Relieving Factors Comments;Medication   sitting   Progression of Symptoms Worse      Oculomotor Exam   Oculomotor Alignment Abnormal    Ocular ROM WFL    Spontaneous Absent    Gaze-induced  Absent    Smooth Pursuits Saccades     Saccades Poor trajectory;Undershoots;Hypometric    Comment Exophoria with cover-uncover test      Oculomotor Exam-Fixation Suppressed    Left Head Impulse positive    Right Head Impulse negative      Vestibulo-Ocular Reflex   VOR to Slow Head Movement Normal    VOR Cancellation Corrective saccades      Positional Testing   Dix-Hallpike Dix-Hallpike Right;Dix-Hallpike Left    Horizontal Canal Testing Horizontal Canal Right;Horizontal Canal Left      Dix-Hallpike Right   Dix-Hallpike Right Duration 0    Dix-Hallpike Right Symptoms No nystagmus      Dix-Hallpike Left   Dix-Hallpike Left Duration 0    Dix-Hallpike Left Symptoms No nystagmus      Horizontal Canal Right   Horizontal Canal Right Duration 0    Horizontal Canal Right Symptoms Normal      Horizontal Canal Left   Horizontal Canal Left Duration 0    Horizontal Canal Left Symptoms Normal      Positional Sensitivities   Sit to Supine Lightheadedness    Supine to Left Side Mild dizziness    Supine to Right Side Mild dizziness    Supine to Sitting Moderate dizziness    Right Hallpike Mild dizziness    Up from Right Hallpike Moderate dizziness    Up from Left Hallpike Mild dizziness    Nose to Right Knee Moderate dizziness    Rolling Right Mild dizziness    Rolling Left Mild dizziness                     Vestibular Treatment/Exercise - 04/24/20 0830      Vestibular Treatment/Exercise   Vestibular Treatment Provided Gaze    Gaze Exercises X1 Viewing Horizontal;X1 Viewing Vertical      X1 Viewing Horizontal   Foot Position seated    Reps 1    Comments 30 seconds; mild symptoms.  Cues for position of letter, speed of movement      X1 Viewing Vertical   Foot Position seated    Reps 1    Comments 30 seconds, no symptoms                 PT Education - 04/24/20 0831    Education Details aquatic therapy, ICD codes, vestibular findings and decompensation, no indication of BPPV, goal of  treatment and exercises, reviewed x1 viewing    Person(s) Educated Patient    Methods Explanation;Demonstration    Comprehension Verbalized understanding;Returned demonstration            PT Short Term Goals - 04/21/20 1037      PT SHORT TERM  GOAL #1   Title Pt will undergo further vestibular assessment - with STG and LTG to be written as appropriate. ALL STGS DUE 05/19/20    Time 4    Period Weeks    Status New    Target Date 05/19/20      PT SHORT TERM GOAL #2   Title Pt will improve DGI score to at least an 18/24 in order to demo decr fall risk.    Baseline 16/24 on 04/21/20    Time 4    Period Weeks    Status New      PT SHORT TERM GOAL #3   Title Pt will improve condition 2 of mCTSIB to at least 30 seconds in order to demo improved balance with vision removed.    Baseline 20 seconds with mild postural sway.    Time 4    Period Weeks    Status New      PT SHORT TERM GOAL #4   Title Pt will perform 5x sit <> stand with BUE support in 16 seconds or less with supervision in order to demo decr fall risk.    Baseline needing min guard during last 2 reps due to imbalance. 18.34 seconds    Time 4    Period Weeks    Status New             PT Long Term Goals - 04/24/20 [id number]      PT LONG TERM GOAL #1   Title Pt will demonstrate improved use of VOR as indicated by ability to perform VOR x1 in standing >60 seconds without UE support    Time 8    Period Weeks    Status New    Target Date 06/20/20      PT LONG TERM GOAL #2   Title Pt will be independent with final HEP in order to build upon functional gains made in therapy.    Time 8    Period Weeks    Status New    Target Date 06/20/20      PT LONG TERM GOAL #3   Title Pt will improve condition 4 of mCTSIB to at least 8 seconds in order to demo improved vestibular input for balance.    Baseline 2 seconds on 04/21/20    Time 8    Period Weeks    Status New    Target Date 06/20/20      PT LONG TERM GOAL #4   Title  Pt will improve DGI score to at least an 20/24 in order to demo decr fall risk.    Baseline 16/24    Time 8    Period Weeks    Status New    Target Date 06/20/20      PT LONG TERM GOAL #5   Title Pt will improve gait speed to at least 2.7 ft/sec in order to demo improved gait efficiency for community mobility.    Baseline 2.31 ft/sec    Time 8    Period Weeks    Status New    Target Date 06/20/20      PT LONG TERM GOAL #6   Title Pt will ambulate at least 500' outdoors over paved surfaces with [facility] with supervision and perform a curb with supervision in order to demo improved community mobility.    Time 8    Period Weeks    Status New    Target Date 06/20/20      PT LONG TERM  GOAL #7   Title Pt will report 0-1/5 for all movements on MSQ to indicate decreased motion sensitivity    Baseline 2-3/5    Time 8    Period Weeks    Status New    Target Date 06/20/20                 Plan - 04/24/20 [id number]    Clinical Impression Statement Completed vestibular assessment today.  No indication of BPPV.  Pt does present with impaired oculomotor exam, + HIT to the L, visual motion sensitivity.  Pt's balance impairments appear to be multi-factorial including vestibular decompensation, impaired sensory integration, impaired vision and cervicogenic.  Will continue to address in order to progress towards LTG and will initiate referral to aquatic therapy.    Personal Factors and Comorbidities Comorbidity 3+;Past/Current Experience;Time since onset of injury/illness/exacerbation    Comorbidities History of left acoustic neuroma, status post left retrosigmoid craniotomy, with residual gait abnormality in 2011, Cerebral small vessel disease, Cervical myelopathy, HLD, COPD, HTN, neuropathy    Examination-Activity Limitations Stand;Transfers;Stairs;Locomotion Level    Examination-Participation Restrictions Cleaning;Community Activity    Stability/Clinical Decision Making Evolving/Moderate complexity     Rehab Potential Good    PT Frequency 2x / week    PT Duration 8 weeks    PT Treatment/Interventions ADLs/Self Care Home Management;Aquatic Therapy;Canalith Repostioning;DME Instruction;Gait training;Stair training;Functional mobility training;Therapeutic activities;Therapeutic exercise;Balance training;Neuromuscular re-education;Patient/family education;Passive range of motion;Vestibular    PT Next Visit Plan initiate HEP (vestibular, balance, strengthening balance reactions, gentle neck exercises). balance with vision removed and on compliant surfaces. wonder if pt might benefit from foot up brace when more fatigued.  Vestibular-Progress VOR, habituation to rollling, supine <> sit, finish MSQ    Recommended Other Services aquatic therapy    Consulted and Agree with Plan of Care Patient           Patient will benefit from skilled therapeutic intervention in order to improve the following deficits and impairments:  Abnormal gait,Decreased balance,Decreased activity tolerance,Difficulty walking,Decreased strength,Dizziness,Impaired sensation,Pain  Visit Diagnosis: Unsteadiness on feet  Dizziness and giddiness  History of falling     Problem List Patient Active Problem List   Diagnosis Date Noted  . Cerebral vascular disease 04/01/2020  . History of acoustic neuroma 04/01/2020  . Cervical stenosis (uterine cervix) 04/01/2020  . Paresthesia 03/05/2020  . Gait abnormality 03/05/2020  . Raynaud's phenomenon without gangrene 12/25/2019  . Emphysema of lung ([place]) 07/09/2019  . CAD (coronary artery disease) 06/19/2019  . History of craniotomy 06/19/2019  . Morbid obesity ([place]) 06/19/2019  . Unspecified optic neuritis 01/25/2018  . Hypertension 01/10/2018  . Optic neuritis, left 01/09/2018  . Cervical post-laminectomy syndrome 05/09/2016  . Cervical myofascial pain syndrome 05/09/2016  . DDD (degenerative disc disease), cervical 05/09/2016  . Cervical spondylosis without  myelopathy 05/09/2016    Rico Junker, PT, DPT 04/24/20    8:42 AM    [facility] [address] [place], [place], [address] Phone: [phone]   Fax:  [phone]  Name: Annaliese Slupski MRN: [id number] Date of Birth: 04/22/1960

## 2020-04-24 NOTE — Patient Instructions (Signed)
Gaze Stabilization: Sitting    Keeping eyes on target on wall 3 feet away, and move head side to side for _30__ seconds. Repeat while moving head up and down for __30_ seconds. Do __2-3__ sessions per day.  Copyright  VHI. All rights reserved.   Gaze Stabilization: Tip Card  1.Target must remain in focus, not blurry, and appear stationary while head is in motion. 2.Perform exercises with small head movements (45 to either side of midline). 3.Increase speed of head motion so long as target is in focus. 4.If you wear eyeglasses, be sure you can see target through lens (therapist will give specific instructions for bifocal / progressive lenses). 5.These exercises may provoke dizziness or nausea. Work through these symptoms. If too dizzy, slow head movement slightly. Rest between each exercise. 6.Exercises demand concentration; avoid distractions.  Copyright  VHI. All rights reserved.     

## 2020-04-27 ENCOUNTER — Ambulatory Visit: Payer: Medicare HMO | Admitting: Physical Therapy

## 2020-04-27 ENCOUNTER — Other Ambulatory Visit: Payer: Self-pay

## 2020-04-27 DIAGNOSIS — R2681 Unsteadiness on feet: Secondary | ICD-10-CM | POA: Diagnosis not present

## 2020-04-27 DIAGNOSIS — Z9181 History of falling: Secondary | ICD-10-CM | POA: Diagnosis not present

## 2020-04-27 DIAGNOSIS — R42 Dizziness and giddiness: Secondary | ICD-10-CM

## 2020-04-27 DIAGNOSIS — R2689 Other abnormalities of gait and mobility: Secondary | ICD-10-CM | POA: Diagnosis not present

## 2020-04-27 DIAGNOSIS — M6281 Muscle weakness (generalized): Secondary | ICD-10-CM | POA: Diagnosis not present

## 2020-04-27 NOTE — Therapy (Signed)
[facility] [address] [place], [place], [address] Phone: [phone]   Fax:  [phone]  Physical Therapy Treatment  Patient Details  Name: Amanda Cordova MRN: [id number] Date of Birth: March 12, 1961 Referring Provider (PT): Marcial Pacas, MD   Encounter Date: 04/27/2020   PT End of Session - 04/27/20 1431    Visit Number 3    Number of Visits 17    Date for PT Re-Evaluation 06/20/20   written for 60 day [place] [employer]- 10th visit PN and authorization required    Progress Note Due on Visit 10    PT Start Time 0716    PT Stop Time 0756    PT Time Calculation (min) 40 min    Activity Tolerance Patient tolerated treatment well    Behavior During Therapy [facility] for tasks assessed/performed           Past Medical History:  Diagnosis Date  . Acoustic neuroma ([place])   . CAD (coronary artery disease)   . COPD (chronic obstructive pulmonary disease) ([place])   . Hyperlipemia   . Hypertension   . Neuropathy   . Optic neuritis     Past Surgical History:  Procedure Laterality Date  . ABDOMINAL HYSTERECTOMY    . BREAST EXCISIONAL BIOPSY Right 2000   benign  . cerical fusion  1995  . CHOLECYSTECTOMY    . 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 HYSTERECTOMY ABDOMINAL WITH SALPINGECTOMY      There were no vitals filed for this visit.   Subjective Assessment - 04/27/20 0718    Subjective Was having a little bit more burning in the feet the past couple of days, now it is feeling better.    Pertinent History History of left acoustic neuroma, status post left retrosigmoid craniotomy, with residual gait abnormality in 2011, Cerebral small vessel disease, Cervical myelopathy, HLD, COPD, HTN, deaf in L ear, neuropathy.    Limitations Walking    How long can you walk comfortably? walks 45 minutes at least once a day.    Diagnostic tests per Dr. Rhea Belton note on 04/01/20: MRI of the brain in  December 2021, scattered deep white matter small vessel disease, left craniotomy, from remote internal auditory canal surgery, left cerebellar encephalomalacia, MRI of cervical spine showed solid fusion from C4-5, 6, abnormal cord signal adjacent to C4 bilaterally, C3-4 level, moderate spinal stenosis due to disc protrusion, ligamentum flavum hypertrophy, facet hypertrophy, uncovertebral spurring    Patient Stated Goals "not sure what PT is going to do for me because i've been like this for so long",  wants to work on balance/leg strength (doesn't want her legs to buckle)    Currently in Pain? No/denies                             [facility] Adult PT Treatment/Exercise - 04/27/20 0001      Therapeutic Activites    Therapeutic Activities Other Therapeutic Activities    Other Therapeutic Activities pt reporting feeling more off balanced when bending open to open her freezer (demonstrates with feet hip width distance), discussed with patient using single hand on countertop for balance and performing with a wider staggered stance for additional support for balance           Vestibular Treatment/Exercise - 04/27/20 0001      Vestibular Treatment/Exercise   Vestibular Treatment Provided Gaze  X1 Viewing Horizontal   Foot Position seated    Reps 2    Comments 30 seconds; cues for proper cervical ROM while performing      X1 Viewing Vertical   Foot Position seated    Reps 1    Comments 30 seconds, no symptoms              Access Code: [email or web address] URL: [email or web address] Date: 04/27/2020 Prepared by: Janann [facility]  Initiated HEP. Pt needing seated rest break after performing sit <> stands. Discussed with pt making sure she takes seated breaks when performing at home when more fatigued.   Exercises Sit to Stand with Armchair - 1-2 x daily - 5 x weekly - 2 sets - 3-4 reps - cues for pt to look at object in the distance and focus on it while  performing, cues for slowed and controlled, using a chair in front while performing at home for balance as needed  Romberg Stance - 1-2 x daily - 5 x weekly - 3 sets - 25-30 hold - eyes closed, using chair as needed for balance, on level ground  Romberg Stance with Head Nods - 1-2 x daily - 5 x weekly - 2 sets - 5 reps- eyes open, head nods 2 x 5 reps, head turns 2 x 5 reps Alternating Step Taps with Counter Support - 1-2 x daily - 5 x weekly - 2 sets - 5 reps - opening cabinet and using bottom shelf, fingertip support for balance   Verbally reviewed with pt gentle neck exercises that she has been performing prior to therapy (gentle upper trap stretch - discussed with pt holding for 20-30 seconds for stretch), and gentle cervical rotation AROM.     PT Education - 04/27/20 0756    Education Details discussed rationale of exercises, aquatic therapy (therapist has put in referral, waiting to hear back from aquatic therapist regarding scheduling), initial HEP    Person(s) Educated Patient    Methods Explanation;Demonstration;Handout    Comprehension Verbalized understanding;Returned demonstration            PT Short Term Goals - 04/21/20 1037      PT SHORT TERM GOAL #1   Title Pt will undergo further vestibular assessment - with STG and LTG to be written as appropriate. ALL STGS DUE 05/19/20    Time 4    Period Weeks    Status New    Target Date 05/19/20      PT SHORT TERM GOAL #2   Title Pt will improve DGI score to at least an 18/24 in order to demo decr fall risk.    Baseline 16/24 on 04/21/20    Time 4    Period Weeks    Status New      PT SHORT TERM GOAL #3   Title Pt will improve condition 2 of mCTSIB to at least 30 seconds in order to demo improved balance with vision removed.    Baseline 20 seconds with mild postural sway.    Time 4    Period Weeks    Status New      PT SHORT TERM GOAL #4   Title Pt will perform 5x sit <> stand with BUE support in 16 seconds or less with  supervision in order to demo decr fall risk.    Baseline needing min guard during last 2 reps due to imbalance. 18.34 seconds    Time 4    Period Weeks    Status New  PT Long Term Goals - 04/24/20 0839      PT LONG TERM GOAL #1   Title Pt will demonstrate improved use of VOR as indicated by ability to perform VOR x1 in standing >60 seconds without UE support    Time 8    Period Weeks    Status New    Target Date 06/20/20      PT LONG TERM GOAL #2   Title Pt will be independent with final HEP in order to build upon functional gains made in therapy.    Time 8    Period Weeks    Status New    Target Date 06/20/20      PT LONG TERM GOAL #3   Title Pt will improve condition 4 of mCTSIB to at least 8 seconds in order to demo improved vestibular input for balance.    Baseline 2 seconds on 04/21/20    Time 8    Period Weeks    Status New    Target Date 06/20/20      PT LONG TERM GOAL #4   Title Pt will improve DGI score to at least an 20/24 in order to demo decr fall risk.    Baseline 16/24    Time 8    Period Weeks    Status New    Target Date 06/20/20      PT LONG TERM GOAL #5   Title Pt will improve gait speed to at least 2.7 ft/sec in order to demo improved gait efficiency for community mobility.    Baseline 2.31 ft/sec    Time 8    Period Weeks    Status New    Target Date 06/20/20      PT LONG TERM GOAL #6   Title Pt will ambulate at least 500' outdoors over paved surfaces with [facility] with supervision and perform a curb with supervision in order to demo improved community mobility.    Time 8    Period Weeks    Status New    Target Date 06/20/20      PT LONG TERM GOAL #7   Title Pt will report 0-1/5 for all movements on MSQ to indicate decreased motion sensitivity    Baseline 2-3/5    Time 8    Period Weeks    Status New    Target Date 06/20/20                 Plan - 04/27/20 1435    Clinical Impression Statement Reviewed seated VOR x1  exercise from last session with pt neeidng initial cues for proper technique (for cervical rotation ROM). Initiated remainder of pt's HEP today for sit <> stands, corner balance for incr vestibular input, and step taps at countertop for SLS/hip flexor strengthening. Pt tolerated session well with no reports of incr dizziness, just feeling unsteady. Will continue to progress towards LTGs.    Personal Factors and Comorbidities Comorbidity 3+;Past/Current Experience;Time since onset of injury/illness/exacerbation    Comorbidities History of left acoustic neuroma, status post left retrosigmoid craniotomy, with residual gait abnormality in 2011, Cerebral small vessel disease, Cervical myelopathy, HLD, COPD, HTN, neuropathy    Examination-Activity Limitations Stand;Transfers;Stairs;Locomotion Level    Examination-Participation Restrictions Cleaning;Community Activity    Stability/Clinical Decision Making Evolving/Moderate complexity    Rehab Potential Good    PT Frequency 2x / week    PT Duration 8 weeks    PT Treatment/Interventions ADLs/Self Care Home Management;Aquatic Therapy;Canalith Repostioning;DME Instruction;Gait training;Stair training;Functional mobility  training;Therapeutic activities;Therapeutic exercise;Balance training;Neuromuscular re-education;Patient/family education;Passive range of motion;Vestibular    PT Next Visit Plan how is HEP? any word from suzanne about aquatics? balance with vision removed and on compliant surfaces. functional BLE strengthening. wonder if pt might benefit from foot up brace when more fatigued.  Vestibular-Progress VOR, habituation to rollling, supine <> sit, finish MSQ    Consulted and Agree with Plan of Care Patient           Patient will benefit from skilled therapeutic intervention in order to improve the following deficits and impairments:  Abnormal gait,Decreased balance,Decreased activity tolerance,Difficulty walking,Decreased strength,Dizziness,Impaired  sensation,Pain  Visit Diagnosis: Dizziness and giddiness  Unsteadiness on feet  History of falling  Other abnormalities of gait and mobility     Problem List Patient Active Problem List   Diagnosis Date Noted  . Cerebral vascular disease 04/01/2020  . History of acoustic neuroma 04/01/2020  . Cervical stenosis (uterine cervix) 04/01/2020  . Paresthesia 03/05/2020  . Gait abnormality 03/05/2020  . Raynaud's phenomenon without gangrene 12/25/2019  . Emphysema of lung ([place]) 07/09/2019  . CAD (coronary artery disease) 06/19/2019  . History of craniotomy 06/19/2019  . Morbid obesity ([place]) 06/19/2019  . Unspecified optic neuritis 01/25/2018  . Hypertension 01/10/2018  . Optic neuritis, left 01/09/2018  . Cervical post-laminectomy syndrome 05/09/2016  . Cervical myofascial pain syndrome 05/09/2016  . DDD (degenerative disc disease), cervical 05/09/2016  . Cervical spondylosis without myelopathy 05/09/2016    Arliss Journey, PT, DPT  04/27/2020, 2:38 PM  [facility] [address] [place], [place], [address] Phone: [phone]   Fax:  [phone]  Name: Amanda Cordova MRN: [id number] Date of Birth: 29-Jul-1960

## 2020-04-27 NOTE — Patient Instructions (Signed)
Access Code: [email or web address] URL: [email or web address] Date: 04/27/2020 Prepared by: Janann [facility]  Exercises Sit to Stand with Armchair - 1-2 x daily - 5 x weekly - 2 sets - 3-4 reps Romberg Stance - 1-2 x daily - 5 x weekly - 3 sets - 25-30 hold Romberg Stance with Head Nods - 1-2 x daily - 5 x weekly - 2 sets - 5 reps Alternating Step Taps with Counter Support - 1-2 x daily - 5 x weekly - 2 sets - 5 reps

## 2020-04-28 DIAGNOSIS — E785 Hyperlipidemia, unspecified: Secondary | ICD-10-CM | POA: Diagnosis not present

## 2020-04-29 DIAGNOSIS — M792 Neuralgia and neuritis, unspecified: Secondary | ICD-10-CM | POA: Diagnosis not present

## 2020-04-29 DIAGNOSIS — I10 Essential (primary) hypertension: Secondary | ICD-10-CM | POA: Diagnosis not present

## 2020-04-29 DIAGNOSIS — Z6841 Body Mass Index (BMI) 40.0 and over, adult: Secondary | ICD-10-CM | POA: Diagnosis not present

## 2020-04-29 DIAGNOSIS — M4802 Spinal stenosis, cervical region: Secondary | ICD-10-CM | POA: Diagnosis not present

## 2020-04-29 LAB — LIPID PANEL
Chol/HDL Ratio: 4.5 ratio — ABNORMAL HIGH (ref 0.0–4.4)
Cholesterol, Total: 167 mg/dL (ref 100–199)
HDL: 37 mg/dL — ABNORMAL LOW (ref >39)
LDL Chol Calc (NIH): 106 mg/dL — ABNORMAL HIGH (ref 0–99)
Triglycerides: 131 mg/dL (ref 0–149)
VLDL Cholesterol Cal: 24 mg/dL (ref 5–40)

## 2020-04-29 LAB — HEPATIC FUNCTION PANEL
ALT: 13 IU/L (ref 0–32)
AST: 23 IU/L (ref 0–40)
Albumin: 4.3 g/dL (ref 3.8–4.8)
Alkaline Phosphatase: 114 IU/L (ref 44–121)
Bilirubin Total: 0.6 mg/dL (ref 0.0–1.2)
Bilirubin, Direct: 0.17 mg/dL (ref 0.00–0.40)
Total Protein: 6.8 g/dL (ref 6.0–8.5)

## 2020-04-30 ENCOUNTER — Ambulatory Visit: Payer: Medicare HMO | Admitting: Physical Therapy

## 2020-04-30 ENCOUNTER — Other Ambulatory Visit: Payer: Self-pay

## 2020-04-30 ENCOUNTER — Encounter: Payer: Self-pay | Admitting: Physical Therapy

## 2020-04-30 DIAGNOSIS — M6281 Muscle weakness (generalized): Secondary | ICD-10-CM | POA: Diagnosis not present

## 2020-04-30 DIAGNOSIS — Z9181 History of falling: Secondary | ICD-10-CM | POA: Diagnosis not present

## 2020-04-30 DIAGNOSIS — R2681 Unsteadiness on feet: Secondary | ICD-10-CM

## 2020-04-30 DIAGNOSIS — R2689 Other abnormalities of gait and mobility: Secondary | ICD-10-CM

## 2020-04-30 DIAGNOSIS — R42 Dizziness and giddiness: Secondary | ICD-10-CM

## 2020-04-30 NOTE — Therapy (Signed)
[facility] [address] [place], [place], [address] Phone: [phone]   Fax:  [phone]  Physical Therapy Treatment  Patient Details  Name: Amanda Cordova MRN: [id number] Date of Birth: 1960-10-24 Referring Provider (PT): Marcial Pacas, MD   Encounter Date: 04/30/2020   PT End of Session - 04/30/20 0857    Visit Number 4    Number of Visits 17    Date for PT Re-Evaluation 06/20/20   written for 60 day [employer]- 10th visit PN and authorization required    Progress Note Due on Visit 10    PT Start Time 0800    PT Stop Time 0845    PT Time Calculation (min) 45 min    Activity Tolerance Patient tolerated treatment well    Behavior During Therapy [facility] for tasks assessed/performed           Past Medical History:  Diagnosis Date  . Acoustic neuroma ([place])   . CAD (coronary artery disease)   . COPD (chronic obstructive pulmonary disease) ([place])   . Hyperlipemia   . Hypertension   . Neuropathy   . Optic neuritis     Past Surgical History:  Procedure Laterality Date  . ABDOMINAL HYSTERECTOMY    . BREAST EXCISIONAL BIOPSY Right 2000   benign  . cerical fusion  1995  . CHOLECYSTECTOMY    . 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 HYSTERECTOMY ABDOMINAL WITH SALPINGECTOMY      There were no vitals filed for this visit.   Subjective Assessment - 04/30/20 0804    Subjective Not having a good day, dizziness is a little worse, more fatigued.  Exercises are going well but does cause a little bit of back pain with the standing.    Pertinent History History of left acoustic neuroma, status post left retrosigmoid craniotomy, with residual gait abnormality in 2011, Cerebral small vessel disease, Cervical myelopathy, HLD, COPD, HTN, deaf in L ear, neuropathy.    Limitations Walking    How long can you walk comfortably? walks 45 minutes at least once a day.    Diagnostic tests  per Dr. Rhea Belton note on 04/01/20: MRI of the brain in December 2021, scattered deep white matter small vessel disease, left craniotomy, from remote internal auditory canal surgery, left cerebellar encephalomalacia, MRI of cervical spine showed solid fusion from C4-5, 6, abnormal cord signal adjacent to C4 bilaterally, C3-4 level, moderate spinal stenosis due to disc protrusion, ligamentum flavum hypertrophy, facet hypertrophy, uncovertebral spurring    Patient Stated Goals "not sure what PT is going to do for me because i've been like this for so long",  wants to work on balance/leg strength (doesn't want her legs to buckle)    Currently in Pain? Yes                   Vestibular Assessment - 04/30/20 0807      Positional Sensitivities   Sit to Supine Lightheadedness    Supine to Left Side Mild dizziness    Supine to Right Side Mild dizziness    Supine to Sitting Moderate dizziness    Right Hallpike Mild dizziness    Up from Right Hallpike Moderate dizziness    Up from Left Hallpike Mild dizziness    Nose to Right Knee Mild dizziness    Right Knee to Sitting Mild dizziness    Nose to Left Knee Mild dizziness    Left Knee  to Sitting Moderate dizziness    Head Turning x 5 Mild dizziness    Head Nodding x 5 Mild dizziness   neck pain   Pivot Right in Standing Moderate dizziness    Pivot Left in Standing Moderate dizziness    Rolling Right Mild dizziness    Rolling Left Mild dizziness    Positional Sensitivities Comments 2 is baseline at rest; 38                     Vestibular Treatment/Exercise - 04/30/20 0815      Vestibular Treatment/Exercise   Vestibular Treatment Provided Gaze      X1 Viewing Horizontal   Foot Position seated >> standing feet apart    Comments 30 seconds, no symptoms > increased to 60 seconds - no symptoms      X1 Viewing Vertical   Foot Position seated >> standing feet together    Comments 30 seconds, no symptoms > increased to 60 seconds -  no symptoms              Balance Exercises - 04/30/20 0855      Balance Exercises: Standing   Turning Right;Left;5 reps;Limitations    Turning Limitations 90 deg turns to the R <> center x 5, L <> center x 5 and then alternating R <> center <> L x 5 reps with UE support on cane and supervision-min A when pt experienced LOB    Heel Raises Both;10 reps    Toe Raise Both;10 reps             PT Education - 04/30/20 0857    Education Details updated HEP    Person(s) Educated Patient    Methods Explanation    Comprehension Verbalized understanding;Returned demonstration            PT Short Term Goals - 04/21/20 1037      PT SHORT TERM GOAL #1   Title Pt will undergo further vestibular assessment - with STG and LTG to be written as appropriate. ALL STGS DUE 05/19/20    Time 4    Period Weeks    Status New    Target Date 05/19/20      PT SHORT TERM GOAL #2   Title Pt will improve DGI score to at least an 18/24 in order to demo decr fall risk.    Baseline 16/24 on 04/21/20    Time 4    Period Weeks    Status New      PT SHORT TERM GOAL #3   Title Pt will improve condition 2 of mCTSIB to at least 30 seconds in order to demo improved balance with vision removed.    Baseline 20 seconds with mild postural sway.    Time 4    Period Weeks    Status New      PT SHORT TERM GOAL #4   Title Pt will perform 5x sit <> stand with BUE support in 16 seconds or less with supervision in order to demo decr fall risk.    Baseline needing min guard during last 2 reps due to imbalance. 18.34 seconds    Time 4    Period Weeks    Status New             PT Long Term Goals - 04/24/20 9379      PT LONG TERM GOAL #1   Title Pt will demonstrate improved use of VOR as indicated by ability to perform  VOR x1 in standing >60 seconds without UE support    Time 8    Period Weeks    Status New    Target Date 06/20/20      PT LONG TERM GOAL #2   Title Pt will be independent with final HEP  in order to build upon functional gains made in therapy.    Time 8    Period Weeks    Status New    Target Date 06/20/20      PT LONG TERM GOAL #3   Title Pt will improve condition 4 of mCTSIB to at least 8 seconds in order to demo improved vestibular input for balance.    Baseline 2 seconds on 04/21/20    Time 8    Period Weeks    Status New    Target Date 06/20/20      PT LONG TERM GOAL #4   Title Pt will improve DGI score to at least an 20/24 in order to demo decr fall risk.    Baseline 16/24    Time 8    Period Weeks    Status New    Target Date 06/20/20      PT LONG TERM GOAL #5   Title Pt will improve gait speed to at least 2.7 ft/sec in order to demo improved gait efficiency for community mobility.    Baseline 2.31 ft/sec    Time 8    Period Weeks    Status New    Target Date 06/20/20      PT LONG TERM GOAL #6   Title Pt will ambulate at least 500' outdoors over paved surfaces with [facility] with supervision and perform a curb with supervision in order to demo improved community mobility.    Time 8    Period Weeks    Status New    Target Date 06/20/20      PT LONG TERM GOAL #7   Title Pt will report 0-1/5 for all movements on MSQ to indicate decreased motion sensitivity    Baseline 2-3/5    Time 8    Period Weeks    Status New    Target Date 06/20/20                 Plan - 04/30/20 0857    Clinical Impression Statement Completed assessment of motion sensitivity with pt demonstrating greatest motion sensitivity to bending down to the ground and pivoting in standing.  Focused on progression of VOR to standing with narrow BOS, habituation to turns and ankle strategy training/strengthening.  Pt tolerated well with no significant increase in dizziness or pain during session.    Personal Factors and Comorbidities Comorbidity 3+;Past/Current Experience;Time since onset of injury/illness/exacerbation    Comorbidities History of left acoustic neuroma, status post left  retrosigmoid craniotomy, with residual gait abnormality in 2011, Cerebral small vessel disease, Cervical myelopathy, HLD, COPD, HTN, neuropathy    Examination-Activity Limitations Stand;Transfers;Stairs;Locomotion Level    Examination-Participation Restrictions Cleaning;Community Activity    Stability/Clinical Decision Making Evolving/Moderate complexity    Rehab Potential Good    PT Frequency 2x / week    PT Duration 8 weeks    PT Treatment/Interventions ADLs/Self Care Home Management;Aquatic Therapy;Canalith Repostioning;DME Instruction;Gait training;Stair training;Functional mobility training;Therapeutic activities;Therapeutic exercise;Balance training;Neuromuscular re-education;Patient/family education;Passive range of motion;Vestibular    PT Next Visit Plan Vinnie Level is asking Langley Gauss about aquatic.  Start on Nustep and add heat around neck before performing vestibular exercises.  How are new vestibular exercises?  Progress  VOR, turns in corner.  Rockerboard balance reaction training.  Balance with vision removed, compliant surfaces.  Gentle neck strengthening.    Consulted and Agree with Plan of Care Patient           Patient will benefit from skilled therapeutic intervention in order to improve the following deficits and impairments:  Abnormal gait,Decreased balance,Decreased activity tolerance,Difficulty walking,Decreased strength,Dizziness,Impaired sensation,Pain  Visit Diagnosis: Dizziness and giddiness  Unsteadiness on feet  History of falling  Other abnormalities of gait and mobility  Muscle weakness (generalized)     Problem List Patient Active Problem List   Diagnosis Date Noted  . Cerebral vascular disease 04/01/2020  . History of acoustic neuroma 04/01/2020  . Cervical stenosis (uterine cervix) 04/01/2020  . Paresthesia 03/05/2020  . Gait abnormality 03/05/2020  . Raynaud's phenomenon without gangrene 12/25/2019  . Emphysema of lung ([place]) 07/09/2019  . CAD  (coronary artery disease) 06/19/2019  . History of craniotomy 06/19/2019  . Morbid obesity ([place]) 06/19/2019  . Unspecified optic neuritis 01/25/2018  . Hypertension 01/10/2018  . Optic neuritis, left 01/09/2018  . Cervical post-laminectomy syndrome 05/09/2016  . Cervical myofascial pain syndrome 05/09/2016  . DDD (degenerative disc disease), cervical 05/09/2016  . Cervical spondylosis without myelopathy 05/09/2016    Rico Junker, PT, DPT 04/30/20    9:09 AM    [facility] [address] [place], [place], [address] Phone: [phone]   Fax:  [phone]  Name: Angeleque Maddock MRN: [id number] Date of Birth: 1960-10-03

## 2020-04-30 NOTE — Patient Instructions (Addendum)
Gaze Stabilization: Standing Feet Together    Heat on neck first. Feet together, keeping eyes on target on wall __3__ feet away, tilt head down 15-30 and move head side to side for _60_ seconds. Repeat while moving head up and down for _60_ seconds.  Work towards using cane less and less for support. Do __2__ sessions per day.    Access Code: [email or web address] URL: [email or web address] Date: 04/30/2020 Prepared by: Misty Stanley  Exercises Sit to Stand with Armchair - 1-2 x daily - 5 x weekly - 2 sets - 3-4 reps Romberg Stance - 1-2 x daily - 5 x weekly - 3 sets - 25-30 hold Romberg Stance with Head Nods - 1-2 x daily - 5 x weekly - 2 sets - 5 reps Alternating Step Taps with Counter Support - 1-2 x daily - 5 x weekly - 2 sets - 5 reps Standing Quarter Turn - 1 x daily - 7 x weekly - 2 sets - 5 reps Heel Toe Raises with Counter Support - 1 x daily - 7 x weekly - 2 sets - 10 reps

## 2020-05-01 ENCOUNTER — Telehealth: Payer: Self-pay | Admitting: Cardiology

## 2020-05-01 DIAGNOSIS — E785 Hyperlipidemia, unspecified: Secondary | ICD-10-CM

## 2020-05-01 MED ORDER — EZETIMIBE 10 MG PO TABS: 10.0000 mg | ORAL_TABLET | Freq: Every day | ORAL | 3 refills | Status: DC

## 2020-05-01 NOTE — Telephone Encounter (Signed)
Amanda Cordova is calling requesting a callback from a nurse to go over her lab results that were released to [facility] with recommendations. Please advise.

## 2020-05-01 NOTE — Telephone Encounter (Signed)
Spoke with the pt. Reviewed recent labs with Dr. Jacalyn Lefevre recommendations. She is agreeable to starting ezetimibe 10 mg. Voices understanding regarding repeat fasting blood work in 12 weeks. Lab orders placed, mailed to pt. Rx sent to pharmacy.

## 2020-05-04 ENCOUNTER — Ambulatory Visit: Payer: Medicare HMO | Admitting: Physical Therapy

## 2020-05-06 ENCOUNTER — Encounter: Payer: Self-pay | Admitting: Physical Therapy

## 2020-05-06 ENCOUNTER — Other Ambulatory Visit: Payer: Self-pay | Admitting: Neurological Surgery

## 2020-05-06 DIAGNOSIS — Z6841 Body Mass Index (BMI) 40.0 and over, adult: Secondary | ICD-10-CM | POA: Diagnosis not present

## 2020-05-06 DIAGNOSIS — I10 Essential (primary) hypertension: Secondary | ICD-10-CM | POA: Diagnosis not present

## 2020-05-06 DIAGNOSIS — M4802 Spinal stenosis, cervical region: Secondary | ICD-10-CM | POA: Diagnosis not present

## 2020-05-06 DIAGNOSIS — G959 Disease of spinal cord, unspecified: Secondary | ICD-10-CM | POA: Diagnosis not present

## 2020-05-06 NOTE — Therapy (Signed)
[facility] [address] [place], [place], [address] Phone: [phone]   Fax:  [phone]  Patient Details  Name: Adhya Cocco MRN: [id number] Date of Birth: 1961-02-02 Referring Provider:  No ref. provider found  Encounter Date: 05/06/2020  PHYSICAL THERAPY DISCHARGE SUMMARY  Visits from Start of Care: 4  Current functional level related to goals / functional outcomes: Unable to formally assess; pt's remaining visits cancelled due to upcoming cervical spine surgery.  Pt to resume therapy after surgery once cleared by physician.   Remaining deficits: Dizziness, impaired balance, weakness   Education / Equipment: HEP  Plan: Patient agrees to discharge.  Patient goals were not met. Patient is being discharged due to the physician's request.  ?????     Rico Junker, PT, DPT 05/06/20    4:01 PM  [facility] [address] [place], [place], [address] Phone: [phone]   Fax:  [phone]

## 2020-05-07 ENCOUNTER — Ambulatory Visit: Payer: Medicare HMO | Admitting: Physical Therapy

## 2020-05-11 ENCOUNTER — Ambulatory Visit: Payer: Medicare HMO | Admitting: Physical Therapy

## 2020-05-13 ENCOUNTER — Ambulatory Visit: Payer: Medicare HMO | Admitting: Physical Therapy

## 2020-05-18 ENCOUNTER — Other Ambulatory Visit: Payer: Self-pay | Admitting: Neurological Surgery

## 2020-05-18 ENCOUNTER — Ambulatory Visit: Payer: Medicare HMO | Admitting: Physical Therapy

## 2020-05-18 ENCOUNTER — Telehealth: Payer: Self-pay | Admitting: *Deleted

## 2020-05-18 NOTE — Telephone Encounter (Signed)
   Primary Cardiologist: Kirk Ruths, MD  Chart reviewed and patient contacted today as part of pre-operative protocol coverage. Given past medical history and time since last visit, based on ACC/AHA guidelines, Zonya Gudger Hamil would be at acceptable risk for the planned procedure without further cardiovascular testing.   OK to hold aspirin 3-5 days pre op.  The patient was advised that if she develops new symptoms prior to surgery to contact our office to arrange for a follow-up visit, and she verbalized understanding.  I will route this recommendation to the requesting party via Epic fax function and remove from pre-op pool.  Please call with questions.  Kerin Ransom, PA-C 05/18/2020, 3:03 PM

## 2020-05-18 NOTE — Telephone Encounter (Signed)
   [facility] Pre-operative Risk Assessment    HEARTCARE STAFF: - Please ensure there is not already an duplicate clearance open for this procedure. - Under Visit Info/Reason for Call, type in Other and utilize the format Clearance MM/DD/YY or Clearance TBD. Do not use dashes or single digits. - If request is for dental extraction, please clarify the # of teeth to be extracted.  Request for surgical clearance:  1. What type of surgery is being performed? Cervical Fusion   2. When is this surgery scheduled? 05/28/2020   3. What type of clearance is required (medical clearance vs. Pharmacy clearance to hold med vs. Both)? Medical  4. Are there any medications that need to be held prior to surgery and how long?Aspirin   5. Practice name and name of physician performing surgery? [facility] Dr Pieter Partridge [place]   6. What is the office phone number? [phone] ext [phone]   7.   What is the office fax number? [phone]  8.   Anesthesia type (None, local, MAC, general) ? Samule Ohm, Jeralene Huff 05/18/2020, 2:29 PM  _________________________________________________________________   (provider comments below)

## 2020-05-19 ENCOUNTER — Ambulatory Visit
Admission: RE | Admit: 2020-05-19 | Discharge: 2020-05-19 | Disposition: A | Discharge status: Home / Self Care | Payer: Medicare HMO | Source: Ambulatory Visit | Attending: Family Medicine | Admitting: Family Medicine

## 2020-05-19 ENCOUNTER — Other Ambulatory Visit: Payer: Self-pay | Admitting: Family Medicine

## 2020-05-19 ENCOUNTER — Other Ambulatory Visit: Payer: Self-pay

## 2020-05-19 DIAGNOSIS — R922 Inconclusive mammogram: Secondary | ICD-10-CM | POA: Diagnosis not present

## 2020-05-19 DIAGNOSIS — Z87898 Personal history of other specified conditions: Secondary | ICD-10-CM

## 2020-05-19 DIAGNOSIS — N644 Mastodynia: Secondary | ICD-10-CM

## 2020-05-19 DIAGNOSIS — N6452 Nipple discharge: Secondary | ICD-10-CM | POA: Diagnosis not present

## 2020-05-19 DIAGNOSIS — Z803 Family history of malignant neoplasm of breast: Secondary | ICD-10-CM | POA: Diagnosis not present

## 2020-05-21 ENCOUNTER — Encounter: Payer: Medicare HMO | Admitting: Physical Therapy

## 2020-05-21 DIAGNOSIS — G4733 Obstructive sleep apnea (adult) (pediatric): Secondary | ICD-10-CM | POA: Diagnosis not present

## 2020-05-22 NOTE — Progress Notes (Signed)
Surgical Instructions    Your procedure is scheduled on Thursday February 10th.  Report to [facility] Main Entrance "A" at 5:30 A.M., then check in with the Admitting office.  Call this number if you have problems the morning of surgery:  [phone]   If you have any questions prior to your surgery date call [phone]: Open Monday-Friday 8am-4pm    Remember:  Do not eat or drink anything after midnight the night before your surgery  You may drink clear liquids until 4:30am the morning of your surgery.   Clear liquids allowed are: Water, Non-Citrus Juices (without pulp), Carbonated Beverages, Clear Tea, Black Coffee Only, and Gatorade    Take these medicines the morning of surgery with A SIP OF WATER   amLODipine (NORVASC) 10 MG tablet   ezetimibe (ZETIA) 10 MG tablet   Fluticasone-Salmeterol (ADVAIR DISKUS) 250-50 MCG/DOSE AEPB  pravastatin (PRAVACHOL) 10 MG tablet IF NEEDED  albuterol (VENTOLIN HFA) 108 (90 Base) MCG/ACT inhaler  ALPRAZolam (XANAX) 0.25 MG tablet  Follow your surgeon's instructions on when to stop Aspirin.  If no instructions were given by your surgeon then you will need to call the office to get those instructions.    As of today, STOP taking any Aspirin (unless otherwise instructed by your surgeon) Aleve, Naproxen, Ibuprofen, Motrin, Advil, Goody's, BC's, all herbal medications, fish oil, and all vitamins.                     Do not wear jewelry, make up, or nail polish            Do not wear lotions, powders, perfumes, or deodorant.            Do not shave 48 hours prior to surgery.              Do not bring valuables to the hospital.            [facility] is not responsible for any belongings or valuables.  Do NOT Smoke (Tobacco/Vaping) or drink Alcohol 24 hours prior to your procedure If you use a CPAP at night, you may bring all equipment for your overnight stay.   Contacts, glasses, dentures or bridgework may not be worn into surgery, please bring  cases for these belongings   For patients admitted to the hospital, discharge time will be determined by your treatment team.   Patients discharged the day of surgery will not be allowed to drive home, and someone needs to stay with them for 24 hours.    Special instructions:   [facility]- Preparing For Surgery  Before surgery, you can play an important role. Because skin is not sterile, your skin needs to be as free of germs as possible. You can reduce the number of germs on your skin by washing with CHG (chlorahexidine gluconate) Soap before surgery.  CHG is an antiseptic cleaner which kills germs and bonds with the skin to continue killing germs even after washing.    Oral Hygiene is also important to reduce your risk of infection.  Remember - BRUSH YOUR TEETH THE MORNING OF SURGERY WITH YOUR REGULAR TOOTHPASTE  Please do not use if you have an allergy to CHG or antibacterial soaps. If your skin becomes reddened/irritated stop using the CHG.  Do not shave (including legs and underarms) for at least 48 hours prior to first CHG shower. It is OK to shave your face.  Please follow these instructions carefully.   1. If you  chose to wash your hair, wash your hair first as usual with your normal shampoo.  2. After you shampoo, rinse your hair and body thoroughly to remove the shampoo.  3. Wash Face and genitals (private parts) with your normal soap.   4. THEN Shower the NIGHT BEFORE SURGERY and the MORNING OF SURGERY with CHG Soap.   5. Use CHG as you would any other liquid soap. You can apply CHG directly to the skin and wash gently with a scrungie or a clean washcloth.   6. Apply the CHG Soap to your body ONLY FROM THE NECK DOWN.  Do not use on open wounds or open sores. Avoid contact with your eyes, ears, mouth and genitals (private parts). Wash Face and genitals (private parts)  with your normal soap.   7. Wash thoroughly, paying special attention to the area where your surgery will  be performed.  8. Thoroughly rinse your body with warm water from the neck down.  9. DO NOT shower/wash with your normal soap after using and rinsing off the CHG Soap.  10. Pat yourself dry with a CLEAN TOWEL.  11. Wear CLEAN PAJAMAS to bed the night before surgery  12. Place CLEAN SHEETS on your bed the night before your surgery  13. DO NOT SLEEP WITH PETS.   Day of Surgery: Wear Clean/Comfortable clothing the morning of surgery Do not apply any deodorants/lotions.   Remember to brush your teeth WITH YOUR REGULAR TOOTHPASTE.   Please read over the following fact sheets that you were given.

## 2020-05-25 ENCOUNTER — Ambulatory Visit: Payer: Medicare HMO | Admitting: Physical Therapy

## 2020-05-25 ENCOUNTER — Other Ambulatory Visit (HOSPITAL_COMMUNITY)
Admission: RE | Admit: 2020-05-25 | Discharge: 2020-05-25 | Disposition: A | Discharge status: Home / Self Care | Payer: Medicare HMO | Source: Ambulatory Visit | Attending: Neurological Surgery | Admitting: Neurological Surgery

## 2020-05-25 ENCOUNTER — Encounter (HOSPITAL_COMMUNITY): Payer: Self-pay

## 2020-05-25 ENCOUNTER — Encounter (HOSPITAL_COMMUNITY)
Admission: RE | Admit: 2020-05-25 | Discharge: 2020-05-25 | Disposition: A | Discharge status: Home / Self Care | Payer: Medicare HMO | Source: Ambulatory Visit | Attending: Neurological Surgery | Admitting: Neurological Surgery

## 2020-05-25 ENCOUNTER — Other Ambulatory Visit: Payer: Self-pay

## 2020-05-25 DIAGNOSIS — Z01812 Encounter for preprocedural laboratory examination: Secondary | ICD-10-CM | POA: Insufficient documentation

## 2020-05-25 DIAGNOSIS — E785 Hyperlipidemia, unspecified: Secondary | ICD-10-CM | POA: Diagnosis not present

## 2020-05-25 DIAGNOSIS — I2510 Atherosclerotic heart disease of native coronary artery without angina pectoris: Secondary | ICD-10-CM | POA: Insufficient documentation

## 2020-05-25 DIAGNOSIS — H469 Unspecified optic neuritis: Secondary | ICD-10-CM | POA: Diagnosis not present

## 2020-05-25 DIAGNOSIS — Z86018 Personal history of other benign neoplasm: Secondary | ICD-10-CM | POA: Diagnosis not present

## 2020-05-25 DIAGNOSIS — G629 Polyneuropathy, unspecified: Secondary | ICD-10-CM | POA: Insufficient documentation

## 2020-05-25 DIAGNOSIS — Z7951 Long term (current) use of inhaled steroids: Secondary | ICD-10-CM | POA: Diagnosis not present

## 2020-05-25 DIAGNOSIS — Z20822 Contact with and (suspected) exposure to covid-19: Secondary | ICD-10-CM | POA: Insufficient documentation

## 2020-05-25 DIAGNOSIS — Z6841 Body Mass Index (BMI) 40.0 and over, adult: Secondary | ICD-10-CM | POA: Diagnosis not present

## 2020-05-25 DIAGNOSIS — G4733 Obstructive sleep apnea (adult) (pediatric): Secondary | ICD-10-CM | POA: Insufficient documentation

## 2020-05-25 DIAGNOSIS — J449 Chronic obstructive pulmonary disease, unspecified: Secondary | ICD-10-CM | POA: Insufficient documentation

## 2020-05-25 DIAGNOSIS — Z7982 Long term (current) use of aspirin: Secondary | ICD-10-CM | POA: Diagnosis not present

## 2020-05-25 DIAGNOSIS — I10 Essential (primary) hypertension: Secondary | ICD-10-CM | POA: Insufficient documentation

## 2020-05-25 DIAGNOSIS — Z79899 Other long term (current) drug therapy: Secondary | ICD-10-CM | POA: Diagnosis not present

## 2020-05-25 HISTORY — DX: Sleep apnea, unspecified: G47.30

## 2020-05-25 LAB — CBC WITH DIFFERENTIAL/PLATELET
Abs Immature Granulocytes: 0.03 10*3/uL (ref 0.00–0.07)
Basophils Absolute: 0.1 10*3/uL (ref 0.0–0.1)
Basophils Relative: 1 %
Eosinophils Absolute: 0.2 10*3/uL (ref 0.0–0.5)
Eosinophils Relative: 2 %
HCT: 48.7 % — ABNORMAL HIGH (ref 36.0–46.0)
Hemoglobin: 15.4 g/dL — ABNORMAL HIGH (ref 12.0–15.0)
Immature Granulocytes: 0 %
Lymphocytes Relative: 24 %
Lymphs Abs: 1.9 10*3/uL (ref 0.7–4.0)
MCH: 29.6 pg (ref 26.0–34.0)
MCHC: 31.6 g/dL (ref 30.0–36.0)
MCV: 93.7 fL (ref 80.0–100.0)
Monocytes Absolute: 0.5 10*3/uL (ref 0.1–1.0)
Monocytes Relative: 7 %
Neutro Abs: 5 10*3/uL (ref 1.7–7.7)
Neutrophils Relative %: 66 %
Platelets: 281 10*3/uL (ref 150–400)
RBC: 5.2 MIL/uL — ABNORMAL HIGH (ref 3.87–5.11)
RDW: 13.2 % (ref 11.5–15.5)
WBC: 7.7 10*3/uL (ref 4.0–10.5)
nRBC: 0 % (ref 0.0–0.2)

## 2020-05-25 LAB — BASIC METABOLIC PANEL
Anion gap: 10 (ref 5–15)
BUN: 10 mg/dL (ref 8–23)
CO2: 27 mmol/L (ref 22–32)
Calcium: 10 mg/dL (ref 8.9–10.3)
Chloride: 106 mmol/L (ref 98–111)
Creatinine, Ser: 0.76 mg/dL (ref 0.44–1.00)
GFR, Estimated: >60 mL/min (ref >60)
Glucose, Bld: 116 mg/dL — ABNORMAL HIGH (ref 70–99)
Potassium: 3.8 mmol/L (ref 3.5–5.1)
Sodium: 143 mmol/L (ref 135–145)

## 2020-05-25 LAB — TYPE AND SCREEN
ABO/RH(D): A POS
Antibody Screen: NEGATIVE

## 2020-05-25 LAB — SURGICAL PCR SCREEN
MRSA, PCR: NEGATIVE
Staphylococcus aureus: NEGATIVE

## 2020-05-25 LAB — SARS CORONAVIRUS 2 (TAT 6-24 HRS): SARS Coronavirus 2: NEGATIVE

## 2020-05-25 NOTE — Progress Notes (Signed)
PCP - Dr. Betty [name] Cardiologist - Dr. Stanford Breed  PPM/ICD - n/a Device Orders -  Rep Notified -   Chest x-ray - n/a EKG - 08/14/19 Stress Test - 03/08/2018 ECHO - 03/05/2018 Cardiac Cath -   Sleep Study - requested from [facility] Sleep CPAP -   Fasting Blood Sugar - n/a Checks Blood Sugar _____ times a day  Blood Thinner Instructions: Aspirin Instructions: last dose 05/18/20  ERAS Protcol - npo after midnight PRE-SURGERY Ensure or G2-   COVID TEST- after PAT appointment   Anesthesia review: yes, hx of CAD  Patient denies shortness of breath, fever, cough and chest pain at PAT appointment   All instructions explained to the patient, with a verbal understanding of the material. Patient agrees to go over the instructions while at home for a better understanding. Patient also instructed to self quarantine after being tested for COVID-19. The opportunity to ask questions was provided.

## 2020-05-25 NOTE — Progress Notes (Signed)
Surgical Instructions    Your procedure is scheduled on Thursday February 10th.  Report to [facility] Main Entrance "A" at 5:30 A.M., then check in with the Admitting office.  Call this number if you have problems the morning of surgery:  [phone]   If you have any questions prior to your surgery date call [phone]: Open Monday-Friday 8am-4pm    Remember:  Do not eat or drink anything after midnight the night before your surgery    Take these medicines the morning of surgery with A SIP OF WATER   amLODipine (NORVASC)    ezetimibe (ZETIA)  Fluticasone-Salmeterol (ADVAIR DISKUS)   pravastatin (PRAVACHOL)  IF NEEDED  albuterol (VENTOLIN HFA)  inhaler  ALPRAZolam (XANAX)  Follow your surgeon's instructions on when to stop Aspirin.  If no instructions were given by your surgeon then you will need to call the office to get those instructions.    As of today, STOP taking any Aspirin (unless otherwise instructed by your surgeon) Aleve, Naproxen, Ibuprofen, Motrin, Advil, Goody's, BC's, all herbal medications, fish oil, and all vitamins.                     Do not wear jewelry, make up, or nail polish            Do not wear lotions, powders, perfumes, or deodorant.            Do not shave 48 hours prior to surgery.              Do not bring valuables to the hospital.            [facility] is not responsible for any belongings or valuables.  Do NOT Smoke (Tobacco/Vaping) or drink Alcohol 24 hours prior to your procedure  If you use a CPAP at night, you may bring all equipment for your overnight stay.   Contacts, glasses, hearing aids, dentures or partials may not be worn into surgery, please bring cases for these belongings   For patients admitted to the hospital, discharge time will be determined by your treatment team.   Patients discharged the day of surgery will not be allowed to drive home, and someone needs to stay with them for 24 hours.    Special instructions:    [place]- Preparing For Surgery  Before surgery, you can play an important role. Because skin is not sterile, your skin needs to be as free of germs as possible. You can reduce the number of germs on your skin by washing with CHG (chlorahexidine gluconate) Soap before surgery.  CHG is an antiseptic cleaner which kills germs and bonds with the skin to continue killing germs even after washing.    Oral Hygiene is also important to reduce your risk of infection.  Remember - BRUSH YOUR TEETH THE MORNING OF SURGERY WITH YOUR REGULAR TOOTHPASTE  Please do not use if you have an allergy to CHG or antibacterial soaps. If your skin becomes reddened/irritated stop using the CHG.  Do not shave (including legs and underarms) for at least 48 hours prior to first CHG shower. It is OK to shave your face.  Please follow these instructions carefully.   1. If you chose to wash your hair, wash your hair first as usual with your normal shampoo.  2. After you shampoo, rinse your hair and body thoroughly to remove the shampoo.  3. Wash Face and genitals (private parts) with your normal soap.   4. THEN  Shower the NIGHT BEFORE SURGERY and the MORNING OF SURGERY with CHG Soap.   5. Use CHG as you would any other liquid soap. You can apply CHG directly to the skin and wash gently with a scrungie or a clean washcloth.   6. Apply the CHG Soap to your body ONLY FROM THE NECK DOWN.  Do not use on open wounds or open sores. Avoid contact with your eyes, ears, mouth and genitals (private parts). Wash Face and genitals (private parts)  with your normal soap.   7. Wash thoroughly, paying special attention to the area where your surgery will be performed.  8. Thoroughly rinse your body with warm water from the neck down.  9. DO NOT shower/wash with your normal soap after using and rinsing off the CHG Soap.  10. Pat yourself dry with a CLEAN TOWEL.  11. Wear CLEAN PAJAMAS to bed the night before surgery  12. Place  CLEAN SHEETS on your bed the night before your surgery  13. DO NOT SLEEP WITH PETS.   Day of Surgery: Shower with CHG soap as directed Wear Clean/Comfortable clothing the morning of surgery Do not apply any deodorants/lotions.   Remember to brush your teeth WITH YOUR REGULAR TOOTHPASTE.   Please read over the following fact sheets that you were given.

## 2020-05-26 ENCOUNTER — Ambulatory Visit
Admission: RE | Admit: 2020-05-26 | Discharge: 2020-05-26 | Disposition: A | Discharge status: Home / Self Care | Payer: Medicare HMO | Source: Ambulatory Visit | Attending: Family Medicine | Admitting: Family Medicine

## 2020-05-26 ENCOUNTER — Other Ambulatory Visit: Payer: Self-pay | Admitting: Radiology

## 2020-05-26 DIAGNOSIS — N6315 Unspecified lump in the right breast, overlapping quadrants: Secondary | ICD-10-CM | POA: Diagnosis not present

## 2020-05-26 DIAGNOSIS — Z87898 Personal history of other specified conditions: Secondary | ICD-10-CM

## 2020-05-26 NOTE — Anesthesia Preprocedure Evaluation (Addendum)
Anesthesia Evaluation  Patient identified by MRN, date of birth, ID band Patient awake    Reviewed: Allergy & Precautions, NPO status , Patient's Chart, lab work & pertinent test results  Airway Mallampati: II  TM Distance: >3 FB Neck ROM: Limited    Dental no notable dental hx.    Pulmonary sleep apnea and Continuous Positive Airway Pressure Ventilation , COPD, former smoker,    Pulmonary exam normal breath sounds clear to auscultation       Cardiovascular hypertension, + CAD (on ASA)  Normal cardiovascular exam Rhythm:Regular Rate:Normal   EKG: 01/14/20: NSR, prolonged QT (QT 488, QTc 491 ms)   CV: Nuclear stress test 03/09/18:  The left ventricular ejection fraction is normal (55-65%).  Nuclear stress EF: 60%.  Blood pressure demonstrated a hypotensive response to exercise.  Nondiagnostic EKG due to baseline diffuse ST/T wave abnormality.  There is a small defect of mild severity present in the apical anterior location. In the setting of normal LVF this is consistent with attenuation artifact. No ischemia noted.  This is a low risk study.   Echo 03/05/18: Study Conclusions  - Left ventricle: The cavity size was normal. There was moderate  concentric hypertrophy. Systolic function was normal. The  estimated ejection fraction was in the range of 55% to 60%. Wall  motion was normal; there were no regional wall motion  abnormalities. Left ventricular diastolic function parameters  were normal.  - Aortic valve: Trileaflet; normal thickness leaflets.  - Aortic root: The aortic root was normal in size.  - Right ventricle: Systolic function was normal.  - Right atrium: The atrium was normal in size.  - Tricuspid valve: There was mild regurgitation.  - Pulmonary arteries: Systolic pressure was within the normal  range.  - Inferior vena cava: The vessel was normal in size.  - Pericardium, extracardiac:  There was no pericardial effusion.      Neuro/Psych  Neuromuscular disease (acoustic neuroma)    GI/Hepatic negative GI ROS, Neg liver ROS,   Endo/Other  Morbid obesity  Renal/GU negative Renal ROS  negative genitourinary   Musculoskeletal  (+) Arthritis ,   Abdominal   Peds  Hematology   Anesthesia Other Findings   Reproductive/Obstetrics negative OB ROS                           Anesthesia Physical Anesthesia Plan  ASA: III  Anesthesia Plan: General   Post-op Pain Management:    Induction: Intravenous  PONV Risk Score and Plan: 3 and Treatment may vary due to age or medical condition, Ondansetron and Dexamethasone  Airway Management Planned: Oral ETT and Video Laryngoscope Planned  Additional Equipment: None  Intra-op Plan:   Post-operative Plan: Extubation in OR  Informed Consent: I have reviewed the patients History and Physical, chart, labs and discussed the procedure including the risks, benefits and alternatives for the proposed anesthesia with the patient or authorized representative who has indicated his/her understanding and acceptance.     Dental advisory given  Plan Discussed with: CRNA and Anesthesiologist  Anesthesia Plan Comments: (PAT note written 05/26/2020 by Myra Gianotti, PA-C. )      Anesthesia Quick Evaluation

## 2020-05-26 NOTE — Progress Notes (Addendum)
Anesthesia Chart Review:  Case: [id number] Date/Time: 05/28/20 0715   Procedure: ACDF C34 (N/A ) - 3C   Anesthesia type: General   Pre-op diagnosis: CERVICAL MYELOPATHY   Location: [facility] OR ROOM 29 / [facility]   Surgeons: Dawley, Theodoro Doing, DO      DISCUSSION: Patient is a 60 year old female scheduled for the above procedure.  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 BMI is consistent with morbid obesity.   Medical clearance at "low risk" per Dr. [name] with permission to hold ASA 7-10 days prior to surgery.   Preoperative cardiologist input outlined on 05/18/20 by Kerin Ransom, PA-C, "Given past medical history and time since last visit, based on ACC/AHA guidelines, Amanda Cordova would be at acceptable risk for the planned procedure without further cardiovascular testing.  OK to hold aspirin 3-5 days pre op...." Last ASA 05/18/20.  05/25/20 presurgical COVID-19 test was negative. Anesthesia team to evaluate on the day of surgery. For 2nd specimen to blood bank and for PT/INR on the day of surgery (per surgeon order).     VS: BP (!) 149/58   Pulse 68   Temp 37.1 C (Oral)   Resp 18   Ht 5\' 4"  (1.626 m)   Wt 110.5 kg   SpO2 98%   BMI 41.80 kg/m    PROVIDERS: [name], [name], MD is PCP  Kirk Ruths, MD is cardiologist. Last visit 01/14/20.    Marcial Pacas, MD is neurologist   LABS: Labs reviewed: Acceptable for surgery. LFTS normal 04/28/20. A1c 5.8% 03/05/20. PT/INR ordered by surgeon.  (all labs ordered are listed, but only abnormal results are displayed)  Labs Reviewed  CBC WITH DIFFERENTIAL/PLATELET - Abnormal; Notable for the following components:      Result Value   RBC 5.20 (*)    Hemoglobin 15.4 (*)    HCT 48.7 (*)    All other components within normal  limits  BASIC METABOLIC PANEL - Abnormal; Notable for the following components:   Glucose, Bld 116 (*)    All other components within normal limits  SURGICAL PCR SCREEN  TYPE AND SCREEN     IMAGES: MRI c-spine 03/22/20: IMPRESSION:  1.   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  Additionally at this level, there is moderately severe right and moderate left foraminal narrowing with probable right C4 nerve root compression. 2.    Mild spinal stenosis is also noted at C2-C3 but there is no nerve root compression at this level. 3.    Minimal anterolisthesis but no spinal stenosis or nerve root compression at C7-T1. 4.    Solid interbody fusion from C4-C6 and anterior and posterior fusion associated with metal artifact at C6-C7.  There does not appear to be nerve root compression at any of these levels. 5.    Normal enhancement pattern.  MRI Brain 03/22/20: IMPRESSION: 1.   Scattered T2/FLAIR hyperintense foci in the subcortical deep white matter most consistent with chronic microvascular ischemic change, more than typical for age but stable compared to the 2019 MRI. 2.   Mild sequela of left craniotomy, likely from remote internal auditory canal surgery 3.   Normal enhancement pattern and no  acute findings.  CT Chest lung cancer screening 02/21/20: IMPRESSION: 1. Lung-RADS 2, benign appearance or behavior. Continue annual screening with low-dose chest CT without contrast in 12 months. 2. Aortic Atherosclerosis (ICD10-I70.0) and Emphysema (ICD10-J43.9). 3. Age advanced coronary artery atherosclerosis. Recommend assessment of coronary risk factors and consideration of medical therapy. 4. Hepatic steatosis. Nonspecific caudate lobe enlargement. Correlate with risk factors for cirrhosis.   EKG: 01/14/20: NSR, prolonged QT (QT 488, QTc  491 ms)   CV: Nuclear stress test 03/09/18:  The left ventricular ejection fraction is normal (55-65%).  Nuclear stress EF: 60%.  Blood pressure demonstrated a hypotensive response to exercise.  Nondiagnostic EKG due to baseline diffuse ST/T wave abnormality.  There is a small defect of mild severity present in the apical anterior location. In the setting of normal LVF this is consistent with attenuation artifact. No ischemia noted.  This is a low risk study.    Echo 03/05/18: Study Conclusions  - Left ventricle: The cavity size was normal. There was moderate  concentric hypertrophy. Systolic function was normal. The  estimated ejection fraction was in the range of 55% to 60%. Wall  motion was normal; there were no regional wall motion  abnormalities. Left ventricular diastolic function parameters  were normal.  - Aortic valve: Trileaflet; normal thickness leaflets.  - Aortic root: The aortic root was normal in size.  - Right ventricle: Systolic function was normal.  - Right atrium: The atrium was normal in size.  - Tricuspid valve: There was mild regurgitation.  - Pulmonary arteries: Systolic pressure was within the normal  range.  - Inferior vena cava: The vessel was normal in size.  - Pericardium, extracardiac: There was no pericardial effusion.    Past Medical History:  Diagnosis Date  . Acoustic neuroma ([place])   . CAD (coronary artery disease)   . COPD (chronic obstructive pulmonary disease) ([place])   . Hyperlipemia   . Hypertension   . Neuropathy   . Optic neuritis   . Sleep apnea    wears cpap    Past Surgical History:  Procedure Laterality Date  . ABDOMINAL HYSTERECTOMY    . BREAST EXCISIONAL BIOPSY Right 2000   benign  . cerical fusion  1995  . CHOLECYSTECTOMY    . 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 HYSTERECTOMY ABDOMINAL WITH SALPINGECTOMY    . MENISCUS REPAIR Left     MEDICATIONS: . albuterol (VENTOLIN HFA) 108 (90 Base)  MCG/ACT inhaler  . ALPRAZolam (XANAX) 0.25 MG tablet  . amLODipine (NORVASC) 10 MG tablet  . aspirin EC 81 MG tablet  . ezetimibe (ZETIA) 10 MG tablet  . Fluticasone-Salmeterol (ADVAIR DISKUS) 250-50 MCG/DOSE AEPB  . Multiple Vitamins-Minerals (CENTRUM SILVER PO)  . pravastatin (PRAVACHOL) 10 MG tablet  . Vitamin D, Ergocalciferol, (DRISDOL) 50000 units CAPS capsule   No current facility-administered medications for this encounter.    Myra Gianotti, PA-C Surgical Short Stay/Anesthesiology [facility] Phone [phone] [facility] Phone [phone] 05/26/2020 10:14 AM

## 2020-05-28 ENCOUNTER — Ambulatory Visit (HOSPITAL_COMMUNITY): Payer: Medicare HMO | Admitting: Anesthesiology

## 2020-05-28 ENCOUNTER — Ambulatory Visit (HOSPITAL_COMMUNITY): Payer: Medicare HMO | Admitting: Vascular Surgery

## 2020-05-28 ENCOUNTER — Encounter (HOSPITAL_COMMUNITY)
Admission: RE | Disposition: A | Discharge status: Home / Self Care | Payer: Self-pay | Source: Home / Self Care | Attending: Neurological Surgery

## 2020-05-28 ENCOUNTER — Encounter (HOSPITAL_COMMUNITY): Payer: Self-pay | Admitting: Neurological Surgery

## 2020-05-28 ENCOUNTER — Other Ambulatory Visit: Payer: Self-pay

## 2020-05-28 ENCOUNTER — Observation Stay (HOSPITAL_COMMUNITY)
Admission: RE | Admit: 2020-05-28 | Discharge: 2020-05-29 | Disposition: A | Discharge status: Home / Self Care | Payer: Medicare HMO | Attending: Neurological Surgery | Admitting: Neurological Surgery

## 2020-05-28 ENCOUNTER — Ambulatory Visit (HOSPITAL_COMMUNITY): Payer: Medicare HMO

## 2020-05-28 DIAGNOSIS — Z79899 Other long term (current) drug therapy: Secondary | ICD-10-CM | POA: Diagnosis not present

## 2020-05-28 DIAGNOSIS — I2510 Atherosclerotic heart disease of native coronary artery without angina pectoris: Secondary | ICD-10-CM | POA: Diagnosis not present

## 2020-05-28 DIAGNOSIS — M4712 Other spondylosis with myelopathy, cervical region: Secondary | ICD-10-CM | POA: Diagnosis not present

## 2020-05-28 DIAGNOSIS — M4722 Other spondylosis with radiculopathy, cervical region: Secondary | ICD-10-CM | POA: Insufficient documentation

## 2020-05-28 DIAGNOSIS — Z7982 Long term (current) use of aspirin: Secondary | ICD-10-CM | POA: Diagnosis not present

## 2020-05-28 DIAGNOSIS — G959 Disease of spinal cord, unspecified: Secondary | ICD-10-CM | POA: Diagnosis present

## 2020-05-28 DIAGNOSIS — Z981 Arthrodesis status: Secondary | ICD-10-CM | POA: Diagnosis not present

## 2020-05-28 DIAGNOSIS — M4322 Fusion of spine, cervical region: Secondary | ICD-10-CM | POA: Diagnosis not present

## 2020-05-28 DIAGNOSIS — I10 Essential (primary) hypertension: Secondary | ICD-10-CM | POA: Diagnosis not present

## 2020-05-28 DIAGNOSIS — M5031 Other cervical disc degeneration,  high cervical region: Secondary | ICD-10-CM | POA: Diagnosis not present

## 2020-05-28 DIAGNOSIS — J449 Chronic obstructive pulmonary disease, unspecified: Secondary | ICD-10-CM | POA: Diagnosis not present

## 2020-05-28 DIAGNOSIS — Z87891 Personal history of nicotine dependence: Secondary | ICD-10-CM | POA: Diagnosis not present

## 2020-05-28 DIAGNOSIS — Z419 Encounter for procedure for purposes other than remedying health state, unspecified: Secondary | ICD-10-CM

## 2020-05-28 HISTORY — PX: ANTERIOR CERVICAL DECOMP/DISCECTOMY FUSION: SHX1161

## 2020-05-28 LAB — PROTIME-INR
INR: 1.1 (ref 0.8–1.2)
Prothrombin Time: 13.7 seconds (ref 11.4–15.2)

## 2020-05-28 LAB — CBC
HCT: 45.2 % (ref 36.0–46.0)
Hemoglobin: 14.4 g/dL (ref 12.0–15.0)
MCH: 29.6 pg (ref 26.0–34.0)
MCHC: 31.9 g/dL (ref 30.0–36.0)
MCV: 93 fL (ref 80.0–100.0)
Platelets: 252 10*3/uL (ref 150–400)
RBC: 4.86 MIL/uL (ref 3.87–5.11)
RDW: 13.1 % (ref 11.5–15.5)
WBC: 7.2 10*3/uL (ref 4.0–10.5)
nRBC: 0 % (ref 0.0–0.2)

## 2020-05-28 LAB — CREATININE, SERUM
Creatinine, Ser: 0.86 mg/dL (ref 0.44–1.00)
GFR, Estimated: >60 mL/min (ref >60)

## 2020-05-28 LAB — ABO/RH: ABO/RH(D): A POS

## 2020-05-28 SURGERY — ANTERIOR CERVICAL DECOMPRESSION/DISCECTOMY FUSION 1 LEVEL
Anesthesia: General | Site: Spine Cervical

## 2020-05-28 MED ORDER — CHLORHEXIDINE GLUCONATE CLOTH 2 % EX PADS: 6.0000 | MEDICATED_PAD | Freq: Once | CUTANEOUS | Status: DC

## 2020-05-28 MED ORDER — ONDANSETRON HCL 4 MG/2ML IJ SOLN
INTRAMUSCULAR | Status: AC
  Filled 2020-05-28: qty 2

## 2020-05-28 MED ORDER — TRIAMCINOLONE ACETONIDE 40 MG/ML IJ SUSP
INTRAMUSCULAR | Status: DC | PRN
  Administered 2020-05-28: 40 mg

## 2020-05-28 MED ORDER — LIDOCAINE 2% (20 MG/ML) 5 ML SYRINGE
INTRAMUSCULAR | Status: DC | PRN
  Administered 2020-05-28: 80 mg via INTRAVENOUS

## 2020-05-28 MED ORDER — HYDROCODONE-ACETAMINOPHEN 5-325 MG PO TABS
1.0000 | ORAL_TABLET | ORAL | Status: DC | PRN
  Administered 2020-05-28: 1 via ORAL
  Administered 2020-05-28 – 2020-05-29 (×4): 2 via ORAL
  Filled 2020-05-28 (×4): qty 2

## 2020-05-28 MED ORDER — CHLORHEXIDINE GLUCONATE 0.12 % MT SOLN: 15.0000 mL | Freq: Once | OROMUCOSAL | Status: AC

## 2020-05-28 MED ORDER — SUGAMMADEX SODIUM 200 MG/2ML IV SOLN
INTRAVENOUS | Status: DC | PRN
  Administered 2020-05-28 (×2): 100 mg via INTRAVENOUS

## 2020-05-28 MED ORDER — SODIUM CHLORIDE 0.9% FLUSH: 3.0000 mL | INTRAVENOUS | Status: DC | PRN

## 2020-05-28 MED ORDER — CEFAZOLIN SODIUM-DEXTROSE 2-4 GM/100ML-% IV SOLN
2.0000 g | Freq: Three times a day (TID) | INTRAVENOUS | Status: AC
  Administered 2020-05-28 (×2): 2 g via INTRAVENOUS
  Filled 2020-05-28 (×2): qty 100

## 2020-05-28 MED ORDER — ACETAMINOPHEN 325 MG PO TABS
650.0000 mg | ORAL_TABLET | ORAL | Status: DC | PRN
  Filled 2020-05-28: qty 2

## 2020-05-28 MED ORDER — HEMOSTATIC AGENTS (NO CHARGE) OPTIME
TOPICAL | Status: DC | PRN
Start: 2020-05-28 — End: 2020-05-28
  Administered 2020-05-28: 1 via TOPICAL

## 2020-05-28 MED ORDER — LACTATED RINGERS IV SOLN: INTRAVENOUS | Status: DC

## 2020-05-28 MED ORDER — MENTHOL 3 MG MT LOZG
1.0000 | LOZENGE | OROMUCOSAL | Status: DC | PRN
  Filled 2020-05-28: qty 9

## 2020-05-28 MED ORDER — PHENOL 1.4 % MT LIQD: 1.0000 | OROMUCOSAL | Status: DC | PRN

## 2020-05-28 MED ORDER — FENTANYL CITRATE (PF) 250 MCG/5ML IJ SOLN
INTRAMUSCULAR | Status: AC
  Filled 2020-05-28: qty 5

## 2020-05-28 MED ORDER — HYDROMORPHONE HCL 1 MG/ML IJ SOLN
0.2500 mg | INTRAMUSCULAR | Status: DC | PRN
  Administered 2020-05-28: 0.25 mg via INTRAVENOUS
  Administered 2020-05-28: 0.5 mg via INTRAVENOUS
  Administered 2020-05-28: 0.25 mg via INTRAVENOUS

## 2020-05-28 MED ORDER — METHOCARBAMOL 500 MG PO TABS
ORAL_TABLET | ORAL | Status: AC
  Filled 2020-05-28: qty 1

## 2020-05-28 MED ORDER — HEPARIN SODIUM (PORCINE) 5000 UNIT/ML IJ SOLN
5000.0000 [IU] | Freq: Two times a day (BID) | INTRAMUSCULAR | Status: DC
  Administered 2020-05-29: 5000 [IU] via SUBCUTANEOUS
  Filled 2020-05-28: qty 1

## 2020-05-28 MED ORDER — FLUTICASONE FUROATE-VILANTEROL 200-25 MCG/INH IN AEPB
1.0000 | INHALATION_SPRAY | Freq: Every day | RESPIRATORY_TRACT | Status: DC
  Administered 2020-05-29: 08:00:00 1 via RESPIRATORY_TRACT
  Filled 2020-05-28: qty 28

## 2020-05-28 MED ORDER — PROMETHAZINE HCL 25 MG/ML IJ SOLN: 6.2500 mg | INTRAMUSCULAR | Status: DC | PRN

## 2020-05-28 MED ORDER — CEFAZOLIN SODIUM-DEXTROSE 2-4 GM/100ML-% IV SOLN
2.0000 g | INTRAVENOUS | Status: AC
  Administered 2020-05-28: 2 g via INTRAVENOUS
  Filled 2020-05-28: qty 100

## 2020-05-28 MED ORDER — ONDANSETRON HCL 4 MG/2ML IJ SOLN: 4.0000 mg | Freq: Four times a day (QID) | INTRAMUSCULAR | Status: DC | PRN

## 2020-05-28 MED ORDER — ROCURONIUM BROMIDE 10 MG/ML (PF) SYRINGE
PREFILLED_SYRINGE | INTRAVENOUS | Status: AC
  Filled 2020-05-28: qty 10

## 2020-05-28 MED ORDER — MIDAZOLAM HCL 5 MG/5ML IJ SOLN
INTRAMUSCULAR | Status: DC | PRN
  Administered 2020-05-28: 2 mg via INTRAVENOUS

## 2020-05-28 MED ORDER — METHOCARBAMOL 1000 MG/10ML IJ SOLN
500.0000 mg | Freq: Four times a day (QID) | INTRAVENOUS | Status: DC | PRN
  Filled 2020-05-28: qty 5

## 2020-05-28 MED ORDER — TRIAMCINOLONE ACETONIDE 40 MG/ML IJ SUSP
INTRAMUSCULAR | Status: AC
  Filled 2020-05-28: qty 5

## 2020-05-28 MED ORDER — ROCURONIUM BROMIDE 10 MG/ML (PF) SYRINGE
PREFILLED_SYRINGE | INTRAVENOUS | Status: DC | PRN
  Administered 2020-05-28: 80 mg via INTRAVENOUS

## 2020-05-28 MED ORDER — MIDAZOLAM HCL 2 MG/2ML IJ SOLN
INTRAMUSCULAR | Status: AC
  Filled 2020-05-28: qty 2

## 2020-05-28 MED ORDER — AMLODIPINE BESYLATE 5 MG PO TABS
5.0000 mg | ORAL_TABLET | Freq: Every day | ORAL | Status: DC
  Administered 2020-05-28 – 2020-05-29 (×2): 5 mg via ORAL
  Filled 2020-05-28 (×2): qty 1

## 2020-05-28 MED ORDER — LACTATED RINGERS IV SOLN: INTRAVENOUS | Status: DC | PRN

## 2020-05-28 MED ORDER — PROPOFOL 500 MG/50ML IV EMUL
INTRAVENOUS | Status: DC | PRN
  Administered 2020-05-28: 100 ug/kg/min via INTRAVENOUS

## 2020-05-28 MED ORDER — PROPOFOL 10 MG/ML IV BOLUS
INTRAVENOUS | Status: AC
  Filled 2020-05-28: qty 20

## 2020-05-28 MED ORDER — PROPOFOL 10 MG/ML IV BOLUS
INTRAVENOUS | Status: DC | PRN
  Administered 2020-05-28: 150 mg via INTRAVENOUS

## 2020-05-28 MED ORDER — THROMBIN 5000 UNITS EX SOLR
OROMUCOSAL | Status: DC | PRN
  Administered 2020-05-28: 5 mL via TOPICAL

## 2020-05-28 MED ORDER — ONDANSETRON HCL 4 MG PO TABS: 4.0000 mg | ORAL_TABLET | Freq: Four times a day (QID) | ORAL | Status: DC | PRN

## 2020-05-28 MED ORDER — MORPHINE SULFATE (PF) 2 MG/ML IV SOLN
2.0000 mg | INTRAVENOUS | Status: DC | PRN
Start: 2020-05-28 — End: 2020-05-29

## 2020-05-28 MED ORDER — FENTANYL CITRATE (PF) 250 MCG/5ML IJ SOLN
INTRAMUSCULAR | Status: DC | PRN
  Administered 2020-05-28: 100 ug via INTRAVENOUS
  Administered 2020-05-28: 50 ug via INTRAVENOUS

## 2020-05-28 MED ORDER — SODIUM CHLORIDE 0.9 % IV SOLN: 250.0000 mL | INTRAVENOUS | Status: DC

## 2020-05-28 MED ORDER — EZETIMIBE 10 MG PO TABS
10.0000 mg | ORAL_TABLET | Freq: Every day | ORAL | Status: DC
  Administered 2020-05-29: 10 mg via ORAL
  Filled 2020-05-28: qty 1

## 2020-05-28 MED ORDER — PRAVASTATIN SODIUM 10 MG PO TABS
10.0000 mg | ORAL_TABLET | Freq: Every day | ORAL | Status: DC
  Administered 2020-05-29: 10 mg via ORAL
  Filled 2020-05-28: qty 1

## 2020-05-28 MED ORDER — ORAL CARE MOUTH RINSE: 15.0000 mL | Freq: Once | OROMUCOSAL | Status: AC

## 2020-05-28 MED ORDER — CHLORHEXIDINE GLUCONATE 0.12 % MT SOLN
OROMUCOSAL | Status: AC
  Administered 2020-05-28: 15 mL via OROMUCOSAL
  Filled 2020-05-28: qty 15

## 2020-05-28 MED ORDER — ACETAMINOPHEN 650 MG RE SUPP: 650.0000 mg | RECTAL | Status: DC | PRN

## 2020-05-28 MED ORDER — DEXAMETHASONE SODIUM PHOSPHATE 10 MG/ML IJ SOLN
INTRAMUSCULAR | Status: AC
  Filled 2020-05-28: qty 1

## 2020-05-28 MED ORDER — SENNOSIDES-DOCUSATE SODIUM 8.6-50 MG PO TABS: 1.0000 | ORAL_TABLET | Freq: Every evening | ORAL | Status: DC | PRN

## 2020-05-28 MED ORDER — CEFAZOLIN SODIUM-DEXTROSE 2-4 GM/100ML-% IV SOLN: 2.0000 g | Freq: Three times a day (TID) | INTRAVENOUS | Status: DC

## 2020-05-28 MED ORDER — SODIUM CHLORIDE 0.9% FLUSH: 3.0000 mL | Freq: Two times a day (BID) | INTRAVENOUS | Status: DC

## 2020-05-28 MED ORDER — LIDOCAINE 2% (20 MG/ML) 5 ML SYRINGE
INTRAMUSCULAR | Status: AC
  Filled 2020-05-28: qty 5

## 2020-05-28 MED ORDER — 0.9 % SODIUM CHLORIDE (POUR BTL) OPTIME
TOPICAL | Status: DC | PRN
  Administered 2020-05-28: 1000 mL

## 2020-05-28 MED ORDER — METHOCARBAMOL 500 MG PO TABS
500.0000 mg | ORAL_TABLET | Freq: Four times a day (QID) | ORAL | Status: DC | PRN
  Administered 2020-05-28 – 2020-05-29 (×4): 500 mg via ORAL
  Filled 2020-05-28 (×3): qty 1

## 2020-05-28 MED ORDER — HYDROCODONE-ACETAMINOPHEN 5-325 MG PO TABS
1.0000 | ORAL_TABLET | ORAL | Status: DC | PRN
  Administered 2020-05-28: 1 via ORAL
  Filled 2020-05-28 (×2): qty 1

## 2020-05-28 MED ORDER — AMISULPRIDE (ANTIEMETIC) 5 MG/2ML IV SOLN: 10.0000 mg | Freq: Once | INTRAVENOUS | Status: DC | PRN

## 2020-05-28 MED ORDER — ALUM & MAG HYDROXIDE-SIMETH 200-200-20 MG/5ML PO SUSP: 30.0000 mL | Freq: Four times a day (QID) | ORAL | Status: DC | PRN

## 2020-05-28 MED ORDER — ACETAMINOPHEN 500 MG PO TABS
1000.0000 mg | ORAL_TABLET | Freq: Once | ORAL | Status: AC
  Administered 2020-05-28: 1000 mg via ORAL
  Filled 2020-05-28: qty 2

## 2020-05-28 MED ORDER — DEXAMETHASONE SODIUM PHOSPHATE 10 MG/ML IJ SOLN
INTRAMUSCULAR | Status: DC | PRN
  Administered 2020-05-28: 5 mg via INTRAVENOUS

## 2020-05-28 MED ORDER — THROMBIN 5000 UNITS EX SOLR
CUTANEOUS | Status: AC
  Filled 2020-05-28: qty 5000

## 2020-05-28 MED ORDER — SODIUM CHLORIDE 0.9 % IV SOLN: INTRAVENOUS | Status: DC

## 2020-05-28 MED ORDER — ALPRAZOLAM 0.25 MG PO TABS: 0.2500 mg | ORAL_TABLET | Freq: Every day | ORAL | Status: DC | PRN

## 2020-05-28 MED ORDER — TRIAMCINOLONE ACETONIDE 40 MG/ML IJ SUSP: INTRAMUSCULAR | Status: DC | PRN

## 2020-05-28 MED ORDER — HYDROMORPHONE HCL 1 MG/ML IJ SOLN
INTRAMUSCULAR | Status: AC
  Filled 2020-05-28: qty 1

## 2020-05-28 MED ORDER — ONDANSETRON HCL 4 MG/2ML IJ SOLN
INTRAMUSCULAR | Status: DC | PRN
  Administered 2020-05-28: 4 mg via INTRAVENOUS

## 2020-05-28 MED ORDER — ALBUTEROL SULFATE HFA 108 (90 BASE) MCG/ACT IN AERS: 2.0000 | INHALATION_SPRAY | Freq: Four times a day (QID) | RESPIRATORY_TRACT | Status: DC | PRN

## 2020-05-28 SURGICAL SUPPLY — 64 items
BAND RUBBER #18 3X1/16 STRL (MISCELLANEOUS) ×4 IMPLANT
BENZOIN TINCTURE PRP APPL 2/3 (GAUZE/BANDAGES/DRESSINGS) ×2 IMPLANT
BIT DRILL NEURO 2X3.1 SFT TUCH (MISCELLANEOUS) ×1 IMPLANT
BLADE CLIPPER SURG (BLADE) IMPLANT
BUR CARBIDE MATCH 3.0 (BURR) ×2 IMPLANT
CANISTER SUCT 3000ML PPV (MISCELLANEOUS) ×2 IMPLANT
CARTRIDGE OIL MAESTRO DRILL (MISCELLANEOUS) ×1 IMPLANT
COVER MAYO STAND STRL (DRAPES) ×2 IMPLANT
COVER WAND RF STERILE (DRAPES) IMPLANT
DIFFUSER DRILL AIR PNEUMATIC (MISCELLANEOUS) ×2 IMPLANT
DRAIN JACKSON RD 7FR 3/32 (WOUND CARE) IMPLANT
DRAPE C-ARM 42X72 X-RAY (DRAPES) ×2 IMPLANT
DRAPE HALF SHEET 40X57 (DRAPES) IMPLANT
DRAPE LAPAROTOMY 100X72X124 (DRAPES) ×2 IMPLANT
DRAPE MICROSCOPE LEICA (MISCELLANEOUS) ×2 IMPLANT
DRILL NEURO 2X3.1 SOFT TOUCH (MISCELLANEOUS) ×2
DRSG OPSITE POSTOP 4X6 (GAUZE/BANDAGES/DRESSINGS) ×2 IMPLANT
DURAPREP 6ML APPLICATOR 50/CS (WOUND CARE) ×2 IMPLANT
ELECT COATED BLADE 2.86 ST (ELECTRODE) ×2 IMPLANT
ELECT REM PT RETURN 9FT ADLT (ELECTROSURGICAL) ×2
ELECTRODE REM PT RTRN 9FT ADLT (ELECTROSURGICAL) ×1 IMPLANT
FEE INTRAOP MONITOR IMPULS NCS (MISCELLANEOUS) ×1 IMPLANT
GAUZE 4X4 16PLY RFD (DISPOSABLE) IMPLANT
GLOVE ECLIPSE 8.0 STRL XLNG CF (GLOVE) ×6 IMPLANT
GLOVE EXAM NITRILE LRG STRL (GLOVE) IMPLANT
GLOVE EXAM NITRILE XL STR (GLOVE) IMPLANT
GLOVE EXAM NITRILE XS STR PU (GLOVE) IMPLANT
GLOVE SRG 8 PF TXTR STRL LF DI (GLOVE) ×2 IMPLANT
GLOVE SURG UNDER POLY LF SZ6.5 (GLOVE) ×2 IMPLANT
GLOVE SURG UNDER POLY LF SZ8 (GLOVE) ×2
GOWN STRL REUS W/ TWL LRG LVL3 (GOWN DISPOSABLE) ×1 IMPLANT
GOWN STRL REUS W/ TWL XL LVL3 (GOWN DISPOSABLE) ×2 IMPLANT
GOWN STRL REUS W/TWL 2XL LVL3 (GOWN DISPOSABLE) IMPLANT
GOWN STRL REUS W/TWL LRG LVL3 (GOWN DISPOSABLE) ×1
GOWN STRL REUS W/TWL XL LVL3 (GOWN DISPOSABLE) ×2
HEMOSTAT POWDER KIT SURGIFOAM (HEMOSTASIS) ×2 IMPLANT
INTRAOP MONITOR FEE IMPULS NCS (MISCELLANEOUS) ×1
INTRAOP MONITOR FEE IMPULSE (MISCELLANEOUS) ×1
KIT BASIN OR (CUSTOM PROCEDURE TRAY) ×2 IMPLANT
KIT TURNOVER KIT B (KITS) ×2 IMPLANT
NEEDLE HYPO 18GX1.5 BLUNT FILL (NEEDLE) ×2 IMPLANT
NEEDLE HYPO 22GX1.5 SAFETY (NEEDLE) IMPLANT
NEEDLE SPNL 18GX3.5 QUINCKE PK (NEEDLE) ×2 IMPLANT
NS IRRIG 1000ML POUR BTL (IV SOLUTION) ×2 IMPLANT
OIL CARTRIDGE MAESTRO DRILL (MISCELLANEOUS) ×2
PACK LAMINECTOMY NEURO (CUSTOM PROCEDURE TRAY) ×2 IMPLANT
PAD ARMBOARD 7.5X6 YLW CONV (MISCELLANEOUS) ×6 IMPLANT
PIN DISTRACTION 14MM (PIN) ×4 IMPLANT
PLATE CERV CONS OZARK 1X20 (Plate) ×2 IMPLANT
PUTTY BONE 100 VESUVIUS 1CC (Putty) ×2 IMPLANT
SCREW FIXED ST OZARK 4X16 (Screw) ×4 IMPLANT
SCREW VA ST OZARK 4X14 (Screw) ×2 IMPLANT
SCREW VA ST OZARK 4X14 FX (Screw) ×2 IMPLANT
SPACER ANGLD CASCAD 16X13X7 7D (Spacer) ×2 IMPLANT
SPONGE INTESTINAL PEANUT (DISPOSABLE) ×2 IMPLANT
SPONGE SURGIFOAM ABS GEL SZ50 (HEMOSTASIS) ×2 IMPLANT
STAPLER VISISTAT 35W (STAPLE) ×2 IMPLANT
STRIP CLOSURE SKIN 1/2X4 (GAUZE/BANDAGES/DRESSINGS) ×2 IMPLANT
SYR 3ML LL SCALE MARK (SYRINGE) ×2 IMPLANT
TAPE SURG TRANSPORE 1 IN (GAUZE/BANDAGES/DRESSINGS) ×1 IMPLANT
TAPE SURGICAL TRANSPORE 1 IN (GAUZE/BANDAGES/DRESSINGS) ×1
TOWEL GREEN STERILE (TOWEL DISPOSABLE) ×2 IMPLANT
TOWEL GREEN STERILE FF (TOWEL DISPOSABLE) ×2 IMPLANT
WATER STERILE IRR 1000ML POUR (IV SOLUTION) ×2 IMPLANT

## 2020-05-28 NOTE — Anesthesia Postprocedure Evaluation (Signed)
Anesthesia Post Note  Patient: Amanda Cordova  Procedure(s) Performed: ANTERIOR CERVICAL DECOMPRESSION FUSION CERVICAL THREE-FOUR. (N/A Spine Cervical)     Patient location during evaluation: PACU Anesthesia Type: General Level of consciousness: awake Pain management: pain level controlled Vital Signs Assessment: post-procedure vital signs reviewed and stable Respiratory status: spontaneous breathing and respiratory function stable Cardiovascular status: stable Postop Assessment: no apparent nausea or vomiting Anesthetic complications: no   No complications documented.  Last Vitals:  Vitals:   05/28/20 1030 05/28/20 1112  BP: 119/69 134/77  Pulse: (!) 58 (!) 56  Resp: 15 18  Temp: 36.4 C (!) 36.4 C  SpO2: 96% 95%    Last Pain:  Vitals:   05/28/20 1112  TempSrc: Oral  PainSc:                  Merlinda Frederick

## 2020-05-28 NOTE — Op Note (Signed)
PREOP DIAGNOSIS: Cervical spondylosis with myeloradiculopathy, C3-4  POSTOP DIAGNOSIS: Same  PROCEDURE: 1. Arthrodesis C3-4, anterior interbody technique  2. Placement of intervertebral biomechanical device C3-4, K2 M [place] interbody 3. Placement of anterior instrumentation consisting of interbody plate and screws -X9-3, 20 mm K2 M [place] plate (71IR screws in C3, 14 mm screws in C4) 4. Discectomy at C3-4 for decompression of spinal cord and exiting nerve roots  5. Use of morselized bone allograft  6.  Use of autograft 7. Use of intraoperative microscope 8.  Intraoperative use of neuro monitoring, SSEPs  SURGEON: Dr. Elwin Sleight, DO  ASSISTANT: Dr. Duffy Rhody, MD  ANESTHESIA: General Endotracheal  EBL: 20 cc  SPECIMENS: None  DRAINS: None  COMPLICATIONS: None immediate  CONDITION: Hemodynamically stable to PACU  HISTORY: Amanda Cordova is a 60 y.o. y.o. female who had a history of a C4-7 ACDF, C6-7 posterior cervical fusion in the 1990s, initially presented to the outpatient clinic with signs and symptoms consistent with myelopathy. Over the past year her gait, numbness, tingling and fine motor movement have been declining.  She was requiring a cane for walking.  She noticed significant acceleration of this over the past 3 months.  She saw neurology and an MRI of the cervical spine was obtained, which revealed C3-4 stenosis with cord signal change. Treatment options were discussed including observation, physical therapy and surgical decompression and fusion. After all questions were answered, informed consent was obtained.  PROCEDURE IN DETAIL: The patient was brought to the operating room and transferred to the operative table. After induction of general anesthesia, the patient was positioned on the operative table in the supine position with all pressure points meticulously padded.  Prepositional SSEPs were obtained and noted to be monitorable.  The patient's neck  was gently placed in extension.  Post positional SSEPs were obtained and noted to be stable.  The skin of the neck was then prepped and draped in the usual sterile fashion.  After timeout was conducted, skin incision was then made sharply and Bovie electrocautery was used to dissect the subcutaneous tissue until the platysma was identified. The platysma was then divided and undermined. The sternocleidomastoid muscle was then identified and, utilizing natural fascial planes in the neck, the prevertebral fascia was identified and the carotid sheath was retracted laterally and the trachea and esophagus retracted medially. Again using fluoroscopy, the correct disc space, C3-4 was identified. Bovie electrocautery was used to dissect in the subperiosteal plane and elevate the bilateral longus coli muscles. Self-retaining retractors were then placed under the longus coli muscles. At this point, the microscope was draped and brought into the field, and the remainder of the case was done under the microscope using microdissecting technique.  Distraction pins were placed in midline at the C3 and C4 vertebral bodies. There were very large osteophytes over the disc space.  The anterior osteophytes were then removed with rongeurs, this bone was saved for autograft.  The disc space was incised sharply and rongeurs were use to initially complete a discectomy.  The disc base was placed under distraction.  The high-speed drill was then used to complete discectomy bilaterally from uncovertebral joint to uncovertebral joint until the posterior annulus was identified and removed and the posterior longitudinal ligament was identified.  The posterior osteophytes were removed with a high-speed drill bilaterally along the superior and inferior endplates.  Using a microcurette, the PLL was elevated, and Kerrison rongeurs were used to remove the posterior longitudinal ligament and the ventral  thecal sac was identified. Using a  combination of micro curettes and ronguers, complete decompression of the thecal sac and exiting nerve roots at this level was completed, and verified using micro-nerve hook.  There was significant osteophytosis at this level.  The bilateral foramen were felt with a blunt nerve hook and noted to be decompressed.  The disc space was taken out of distraction. Hemostasis was achieved with surgiflow and cottonoids.   Having completed our decompression, attention was turned to placement of the intervertebral device. Trial spacers were used to select a 7 mm lordotic graft. The epidural space was noted to be hemostatic. This graft was then filled with morcellized allograft, autograft and inserted under live fluoroscopy. The distraction pins were removed and hemostasis was achieved with surgiflo.  After placement of the intervertebral device, the above anterior cervical plate was selected, and placed across the interspace. Using a high-speed drill, the cortex of the cervical vertebral bodies was punctured, and screws inserted in the level above and below. Final fluoroscopic images in AP and lateral projections were taken to confirm good hardware placement.  At this point, after all counts were verified to be correct, meticulous hemostasis was secured using a combination of bipolar electrocautery and passive hemostatics.  The skin was then closed with staples.  Sterile dressing was applied.  SSEPs were noted to be stable throughout the entirety of the case.  The patient tolerated the procedure well and was extubated in the room and taken to the postanesthesia care unit in stable condition.

## 2020-05-28 NOTE — Progress Notes (Signed)
   Providing Compassionate, Quality Care - Together  NEUROSURGERY PROGRESS NOTE   S: pt s/e in pacu  O: EXAM:  BP 119/69 (BP Location: Right Arm)   Pulse (!) 58   Temp 97.9 F (36.6 C)   Resp 15   Ht 5\' 4"  (1.626 m)   Wt 110.2 kg   SpO2 96%   BMI 41.71 kg/m   Awake, alert Speech fluent, appropriate  nml phonation Trachea midline Neck soft CNs grossly intact  MAE symmetrically Incision c/d/i  ASSESSMENT:  60 y.o. female with   1. 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  Please do not hesitate to call with questions or concerns.   Elwin Sleight, [facility] Cell: [phone]

## 2020-05-28 NOTE — Anesthesia Procedure Notes (Signed)
Procedure Name: Intubation Date/Time: 05/28/2020 7:48 AM Performed by: Trinna Post., CRNA Pre-anesthesia Checklist: Patient identified, Emergency Drugs available, Suction available, Patient being monitored and Timeout performed Patient Re-evaluated:Patient Re-evaluated prior to induction Oxygen Delivery Method: Circle system utilized Preoxygenation: Pre-oxygenation with 100% oxygen Induction Type: IV induction Ventilation: Mask ventilation without difficulty and Oral airway inserted - appropriate to patient size Laryngoscope Size: Glidescope and 3 Grade View: Grade I Tube type: Oral Tube size: 7.0 mm Number of attempts: 1 Airway Equipment and Method: Rigid stylet and Video-laryngoscopy Placement Confirmation: ETT inserted through vocal cords under direct vision,  positive ETCO2 and breath sounds checked- equal and bilateral Secured at: 22 cm Tube secured with: Tape Dental Injury: Teeth and Oropharynx as per pre-operative assessment  Comments: Elective glidescope use to minimize manipulation of head and neck during induction and intubation. Head and neck maintained in neutral alignment throughout

## 2020-05-28 NOTE — Transfer of Care (Signed)
Immediate Anesthesia Transfer of Care Note  Patient: Amanda Cordova  Procedure(s) Performed: ANTERIOR CERVICAL DECOMPRESSION FUSION CERVICAL THREE-FOUR. (N/A Spine Cervical)  Patient Location: PACU  Anesthesia Type:General  Level of Consciousness: awake, alert , oriented and drowsy  Airway & Oxygen Therapy: Patient Spontanous Breathing  Post-op Assessment: Report given to RN and Post -op Vital signs reviewed and stable  Post vital signs: Reviewed and stable  Last Vitals:  Vitals Value Taken Time  BP 108/62 05/28/20 1006  Temp 36.6 C 05/28/20 1000  Pulse 59 05/28/20 1010  Resp 16 05/28/20 1010  SpO2 94 % 05/28/20 1010  Vitals shown include unvalidated device data.  Last Pain:  Vitals:   05/28/20 1000  TempSrc:   PainSc: 9       Patients Stated Pain Goal: 3 (38/37/79 3968)  Complications: No complications documented.

## 2020-05-28 NOTE — H&P (Signed)
Providing Compassionate, Quality Care - Together  NEUROSURGERY HISTORY & PHYSICAL   Amanda Cordova is an 60 y.o. female.   Chief Complaint: Bilateral upper extremity numbness, gait disturbance HPI: This is a 60 year old female with a history of C4-7 ACDF in the 1990s with C6-7 pseudoarthrosis and posterior instrumentation in the 1990s complains of worsening balance difficulty, numbness, tingling and weakness in her hands.  She complains of progressive weakness in terms of difficulty dropping objects and worsening walking now requiring a cane.  Over the past year this has been progressing especially over the past 3 months.  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  Past Medical History:  Diagnosis Date  . Acoustic neuroma ([place])   . CAD (coronary artery disease)   . COPD (chronic obstructive pulmonary disease) ([place])   . Hyperlipemia   . Hypertension   . Neuropathy   . Optic neuritis   . Sleep apnea    wears cpap    Past Surgical History:  Procedure Laterality Date  . ABDOMINAL HYSTERECTOMY    . BREAST EXCISIONAL BIOPSY Right 2000   benign  . cerical fusion  1995  . CHOLECYSTECTOMY    . 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 HYSTERECTOMY ABDOMINAL WITH SALPINGECTOMY    . MENISCUS REPAIR Left     Family History  Problem Relation Age of Onset  . Breast cancer Sister   . Diabetes Sister   . Cancer Sister   . COPD Mother   . Heart failure Mother   . Heart attack Father   . Diabetes Maternal Aunt   . Cancer Sister   . Breast cancer Sister   . Cancer Sister   . Breast cancer Sister    Social History:  reports that she quit smoking about 2 years ago. Her smoking use included cigarettes. She has a 35.00 pack-year smoking history. She has never used smokeless tobacco. She reports previous alcohol use. She reports that she does not use drugs.  Allergies:  Allergies  Allergen Reactions  . Gabapentin Anaphylaxis  and Swelling    Throat and tongue swells Throat and tongue swells  . Tramadol Swelling    Throat and hands  . Duloxetine Nausea And Vomiting  . Lisinopril Nausea Only    Medications Prior to Admission  Medication Sig Dispense Refill  . albuterol (VENTOLIN HFA) 108 (90 Base) MCG/ACT inhaler INHALE 2 PUFFS INTO THE LUNGS EVERY 6 HOURS AS NEEDED FOR WHEEZING OR SHORTNESS OF BREATH (Patient taking differently: Inhale 2 puffs into the lungs every 6 (six) hours as needed for wheezing or shortness of breath.) 54 g 1  . ALPRAZolam (XANAX) 0.25 MG tablet TAKE 1 TABLET(0.25 MG) BY MOUTH DAILY AS NEEDED FOR ANXIETY OR SLEEP (Patient taking differently: Take 0.25 mg by mouth daily as needed for anxiety.) 30 tablet 2  . amLODipine (NORVASC) 10 MG tablet Take 0.5 tablets (5 mg total) by mouth daily. 90 tablet 3  . ezetimibe (ZETIA) 10 MG tablet Take 1 tablet (10 mg total) by mouth daily. 90 tablet 3  . Fluticasone-Salmeterol (ADVAIR DISKUS) 250-50 MCG/DOSE AEPB Inhale 1 puff into the lungs 2 (two) times daily. 1 each 3  . Multiple Vitamins-Minerals (CENTRUM SILVER PO) Take 1 tablet by mouth daily.    . pravastatin (PRAVACHOL) 10 MG tablet Take 1 tablet (10 mg total) by mouth daily. 90 tablet 0  . Vitamin D, Ergocalciferol, (DRISDOL) 50000 units CAPS capsule Take  50,000 Units by mouth every 7 (seven) days.     Marland Kitchen aspirin EC 81 MG tablet Take 1 tablet (81 mg total) by mouth daily.      Results for orders placed or performed during the hospital encounter of 05/28/20 (from the past 48 hour(s))  Protime-INR     Status: None   Collection Time: 05/28/20  5:53 AM  Result Value Ref Range   Prothrombin Time 13.7 11.4 - 15.2 seconds   INR 1.1 0.8 - 1.2    Comment: (NOTE) INR goal varies based on device and disease states. Performed at [facility], [place] [address] [place], [place] [address]   ABO/Rh     Status: None (Preliminary result)   Collection Time: 05/28/20  6:19 AM  Result Value Ref Range    ABO/RH(D)      A POS Performed at [facility] [address] [place], [place] [address]    MM CLIP PLACEMENT RIGHT  Result Date: 05/26/2020 CLINICAL DATA:  Evaluate biopsy marker EXAM: DIAGNOSTIC RIGHT MAMMOGRAM POST ULTRASOUND BIOPSY COMPARISON:  Previous exam(s). FINDINGS: Mammographic images were obtained following ultrasound guided biopsy of a possible right intraductal mass. The biopsy marking clip is in expected position at the site of biopsy. IMPRESSION: Appropriate positioning of the ribbon shaped biopsy marking clip at the site of biopsy in the location of the biopsied possible mass in the right breast. Final Assessment: Post Procedure Mammograms for Marker Placement Electronically Signed   By: Dorise Bullion III M.D   On: 05/26/2020 14:35   [place] RT BREAST BX W LOC DEV 1ST LESION IMG BX [place] US GUIDE  Result Date: 05/26/2020 CLINICAL DATA:  Biopsy of a possible mass in the right breast at 12 o'clock, 1 cm from the nipple. EXAM: ULTRASOUND GUIDED RIGHT BREAST CORE NEEDLE BIOPSY COMPARISON:  Previous exam(s). PROCEDURE: I met with the patient and we discussed the procedure of ultrasound-guided biopsy, including benefits and alternatives. We discussed the high likelihood of a successful procedure. We discussed the risks of the procedure, including infection, bleeding, tissue injury, clip migration, and inadequate sampling. Informed written consent was given. The usual time-out protocol was performed immediately prior to the procedure. Lesion quadrant: 12 o'clock, 1 cm from the nipple Using sterile technique and 1% Lidocaine as local anesthetic, under direct ultrasound visualization, a 12 gauge spring-loaded device was used to perform biopsy of a right breast mass using a lateral approach. At the conclusion of the procedure a ribbon shaped tissue marker clip was deployed into the biopsy cavity. Follow up 2 view mammogram was performed and dictated separately. IMPRESSION: Ultrasound guided  biopsy of a right breast mass. No apparent complications. Electronically Signed   By: Dorise Bullion III M.D   On: 05/26/2020 14:36    ROS 14 point review of systems was obtained which all pertinent positive and negatives are listed in HPI above  Blood pressure (!) 151/89, pulse 73, temperature 98.1 F (36.7 C), temperature source Oral, resp. rate 18, height $RemoveBe'5\' 4"'KDeLnSlHZ$  (1.626 m), weight 110.2 kg, SpO2 96 %. Physical Exam  A&O x3, no acute distress PERRLA, EOMI Face symmetric Bilateral upper extremities 4+/5, positive Hoffmann's bilaterally Sensory intact to light touch Bilateral lower extremities 4+/5  Assessment/Plan 60 year old female with  1.  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  Due to her 2 previous anterior cervical surgeries, and her current slight difficulty with swallowing we  did discuss the possibility of having worsening swallowing difficulties in a prolonged fashion compared to someone and had not have previous cervical surgery.  Risks and benefits discussed including but not limited to heart attack, stroke, death, worsening or permanent neurologic deficit, infection, hardware failure, need for revision surgery.  The goal of the surgery is to stop her progressive myelopathy symptoms and hopes to regain her balance and improve her numbness/tingling and weakness with fine motor movement.  She agreed to proceed.  She was medically cleared by her physician, been off 81 mg of aspirin for 10 days.   Thank you for allowing me to participate in this patient's care.  Please do not hesitate to call with questions or concerns.   Elwin Sleight, [facility] Cell: [phone]

## 2020-05-29 ENCOUNTER — Encounter (HOSPITAL_COMMUNITY): Payer: Self-pay | Admitting: Neurological Surgery

## 2020-05-29 ENCOUNTER — Other Ambulatory Visit: Payer: Self-pay | Admitting: Neurological Surgery

## 2020-05-29 ENCOUNTER — Encounter: Payer: Medicare HMO | Admitting: Physical Therapy

## 2020-05-29 DIAGNOSIS — I2510 Atherosclerotic heart disease of native coronary artery without angina pectoris: Secondary | ICD-10-CM | POA: Diagnosis not present

## 2020-05-29 DIAGNOSIS — J449 Chronic obstructive pulmonary disease, unspecified: Secondary | ICD-10-CM | POA: Diagnosis not present

## 2020-05-29 DIAGNOSIS — I10 Essential (primary) hypertension: Secondary | ICD-10-CM | POA: Diagnosis not present

## 2020-05-29 DIAGNOSIS — Z7982 Long term (current) use of aspirin: Secondary | ICD-10-CM | POA: Diagnosis not present

## 2020-05-29 DIAGNOSIS — M4722 Other spondylosis with radiculopathy, cervical region: Secondary | ICD-10-CM | POA: Diagnosis not present

## 2020-05-29 DIAGNOSIS — Z87891 Personal history of nicotine dependence: Secondary | ICD-10-CM | POA: Diagnosis not present

## 2020-05-29 DIAGNOSIS — M4712 Other spondylosis with myelopathy, cervical region: Secondary | ICD-10-CM | POA: Diagnosis not present

## 2020-05-29 DIAGNOSIS — Z79899 Other long term (current) drug therapy: Secondary | ICD-10-CM | POA: Diagnosis not present

## 2020-05-29 MED ORDER — HYDROCODONE-ACETAMINOPHEN 5-325 MG PO TABS: 1.0000 | ORAL_TABLET | ORAL | 0 refills | Status: DC | PRN

## 2020-05-29 MED ORDER — METHOCARBAMOL 500 MG PO TABS: 500.0000 mg | ORAL_TABLET | Freq: Three times a day (TID) | ORAL | 1 refills | Status: DC | PRN

## 2020-05-29 MED ORDER — ASPIRIN EC 81 MG PO TBEC: 81.0000 mg | DELAYED_RELEASE_TABLET | Freq: Every day | ORAL | 11 refills | Status: DC

## 2020-05-29 MED FILL — METHOCARBAMOL 500 MG TABS: 500 | 30 days supply | Qty: 90 | Fill #0

## 2020-05-29 MED FILL — HYDROCODON-APAP 5-325: 5-325 | 5 days supply | Qty: 60 | Fill #0

## 2020-05-29 NOTE — Discharge Summary (Addendum)
  Physician Discharge Summary  Patient ID: Amanda Cordova MRN: [id number] DOB/AGE: 1960-08-31 60 y.o.  Admit date: 05/28/2020 Discharge date: 05/29/2020  Admission Diagnoses:  1. 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 female that presented for ACDF C3-4 on 05/28/2020. She tolerated surgery well, her numbness and tingling improved postop in her hands. She was seen by PT/OT and recommended home DC. Her pain was controlled and she was tolerating a diet, having normal bowel and bladder function upon discharge. Her wound was clean dry and intact.  Treatments: Surgery - ACDF C3-4  Discharge Exam: Blood pressure 115/65, pulse 61, temperature (!) 97.5 F (36.4 C), temperature source Oral, resp. rate 17, height 5\' 4"  (1.626 m), weight 110.2 kg, SpO2 96 %. Awake, alert, oriented Speech fluent, appropriate PERRL CN grossly intact 5/5 BUE/BLE Wound c/d/i Trachea midline nml phonation Neck soft   Disposition: Discharge disposition: 01-Home or Self Care       Discharge Instructions    Incentive spirometry RT   Complete by: As directed      Allergies as of 05/29/2020      Reactions   Gabapentin Anaphylaxis, Swelling   Throat and tongue swells Throat and tongue swells   Tramadol Swelling   Throat and hands   Duloxetine Nausea And Vomiting   Lisinopril Nausea Only      Medication List    STOP taking these medications   Vitamin D (Ergocalciferol) 1.25 MG (50000 UNIT) Caps capsule Commonly known as: DRISDOL     TAKE these medications   albuterol 108 (90 Base) MCG/ACT inhaler Commonly known as: VENTOLIN HFA INHALE 2 PUFFS INTO THE LUNGS EVERY 6 HOURS AS NEEDED FOR WHEEZING OR SHORTNESS OF BREATH What changed: See the new instructions.   ALPRAZolam 0.25 MG tablet Commonly known as:  XANAX TAKE 1 TABLET(0.25 MG) BY MOUTH DAILY AS NEEDED FOR ANXIETY OR SLEEP What changed: See the new instructions.   amLODipine 10 MG tablet Commonly known as: NORVASC Take 0.5 tablets (5 mg total) by mouth daily.   aspirin EC 81 MG tablet Take 1 tablet (81 mg total) by mouth daily. RESTART in 7 days What changed: additional instructions   CENTRUM SILVER PO Take 1 tablet by mouth daily.   ezetimibe 10 MG tablet Commonly known as: ZETIA Take 1 tablet (10 mg total) by mouth daily.   Fluticasone-Salmeterol 250-50 MCG/DOSE Aepb Commonly known as: Advair Diskus Inhale 1 puff into the lungs 2 (two) times daily.   HYDROcodone-acetaminophen 5-325 MG tablet Commonly known as: NORCO/VICODIN Take 1-2 tablets by mouth every 4 (four) hours as needed for moderate pain ((score 4 to 6)).   methocarbamol 500 MG tablet Commonly known as: ROBAXIN Take 1 tablet (500 mg total) by mouth every 8 (eight) hours as needed for muscle spasms.   pravastatin 10 MG tablet Commonly known as: PRAVACHOL Take 1 tablet (10 mg total) by mouth daily.       Follow-up Information    Syan Cullimore C, DO Follow up in 10 day(s).   Why: will set up appointment Contact information: [place] [address] [phone]               Signed: Theodoro Doing Karista Aispuro 05/29/2020, 10:15 AM

## 2020-05-29 NOTE — Evaluation (Addendum)
Physical Therapy Evaluation and Discharge Patient Details Name: Amanda Cordova MRN: [id number] DOB: [birth year] Today's Date: 05/29/2020   History of Present Illness  Pt is a 60 y/o female who presents s/p ACDF C3-4 on 05/28/20. PMH includes CAD, COPD, HTN, optic neuritis, sleep apnea and acoustic neuroma.    Clinical Impression  Patient evaluated by Physical Therapy with no further acute PT needs identified. All education has been completed and the patient has no further questions. Pt was able to demonstrate transfers and ambulation with gross modified independence and RW/SPC for support. Pt feels comfortable with SPC and prefers to use this at home. Pt was educated on precautions, brace application/wearing schedule, appropriate activity progression, and car transfer. See below for any follow-up Physical Therapy or equipment needs. PT is signing off. Thank you for this referral.     Follow Up Recommendations No PT follow up;Supervision for mobility/OOB    Equipment Recommendations  None recommended by PT    Recommendations for Other Services       Precautions / Restrictions Precautions Precautions: Cervical;Fall Precaution Booklet Issued: Yes (comment) Precaution Comments: Reviewed handout and pt was cued for precautions during functional mobility Required Braces or Orthoses: Cervical Brace Cervical Brace: Hard collar Restrictions Weight Bearing Restrictions: No      Mobility  Bed Mobility Overal bed mobility: Modified Independent             General bed mobility comments: pt reports her bed at home is able to adjust, modified independent for supine<>sit    Transfers Overall transfer level: Modified independent Equipment used: Rolling walker (2 wheeled);Straight cane             General transfer comment: VC's for hand placement on seated surface for safety with RW, however demonstrated proper hand placement with SPC later in  session.  Ambulation/Gait Ambulation/Gait assistance: Supervision Gait Distance (Feet): 300 Feet Assistive device: Rolling walker (2 wheeled);Straight cane Gait Pattern/deviations: Step-through pattern;Decreased stride length;Trunk flexed Gait velocity: Decreased Gait velocity interpretation: <1.31 ft/sec, indicative of household ambulator General Gait Details: VC's for improved posture, closer walker proximity, and forward gaze. No overt LOB noted.  Stairs Stairs: Yes Stairs assistance: Min guard Stair Management: One rail Right;Step to pattern Number of Stairs: 4 General stair comments: VC's for sequencing and general safety. No assist required however close guard provided for safety.  Wheelchair Mobility    Modified Rankin (Stroke Patients Only)       Balance Overall balance assessment: Needs assistance Sitting-balance support: Feet supported;No upper extremity supported Sitting balance-Leahy Scale: Fair     Standing balance support: No upper extremity supported;During functional activity Standing balance-Leahy Scale: Fair Standing balance comment: Better with UE support                             Pertinent Vitals/Pain Pain Assessment: 0-10 Pain Score: 5  Faces Pain Scale: Hurts little more Pain Location: Incision site Pain Descriptors / Indicators: Operative site guarding;Discomfort Pain Intervention(s): Limited activity within patient's tolerance;Monitored during session    Home Living Family/patient expects to be discharged to:: Private residence Living Arrangements: Spouse/significant other Available Help at Discharge: Family;Available 24 hours/day Type of Home: House Home Access: Stairs to enter   [employer] of Steps: 4 Home Layout: One level Home Equipment: Walker - standard;Cane - single point      Prior Function Level of Independence: Independent with assistive device(s)         Comments: SPC  PTA     Hand Dominance    Dominant Hand: Right    Extremity/Trunk Assessment   Upper Extremity Assessment Upper Extremity Assessment: Overall WFL for tasks assessed    Lower Extremity Assessment Lower Extremity Assessment: Overall WFL for tasks assessed    Cervical / Trunk Assessment Cervical / Trunk Assessment: Other exceptions Cervical / Trunk Exceptions: s/p surgery  Communication   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)  Cognition Arousal/Alertness: Awake/alert Behavior During Therapy: WFL for tasks assessed/performed Overall Cognitive Status: Within Functional Limits for tasks assessed                                 General Comments: demonstrated good safety awareness, good adherence to precautions      General Comments General comments (skin integrity, edema, etc.): vss    Exercises     Assessment/Plan    PT Assessment Patent does not need any further PT services  PT Problem List         PT Treatment Interventions      PT Goals (Current goals can be found in the Care Plan section)  Acute Rehab PT Goals Patient Stated Goal: Home today PT Goal Formulation: All assessment and education complete, DC therapy    Frequency     Barriers to discharge        Co-evaluation               AM-PAC PT "6 Clicks" Mobility  Outcome Measure Help needed turning from your back to your side while in a flat bed without using bedrails?: None Help needed moving from lying on your back to sitting on the side of a flat bed without using bedrails?: None Help needed moving to and from a bed to a chair (including a wheelchair)?: None Help needed standing up from a chair using your arms (e.g., wheelchair or bedside chair)?: None Help needed to walk in hospital room?: None Help needed climbing 3-5 steps with a railing? : A Little 6 Click Score: 23    End of Session Equipment Utilized During Treatment: Gait belt;Cervical collar Activity Tolerance: Patient tolerated treatment  well Patient left: in bed;with call bell/phone within reach Nurse Communication: Mobility status PT Visit Diagnosis: Unsteadiness on feet (R26.81)    Time: 0865-7846 PT Time Calculation (min) (ACUTE ONLY): 21 min   Charges:   PT Evaluation $PT Eval Low Complexity: 1 Low          Rolinda Roan, PT, DPT Acute Rehabilitation Services Pager: [phone] Office: [phone]   Thelma Comp 05/29/2020, 11:10 AM

## 2020-05-29 NOTE — Evaluation (Signed)
Occupational Therapy Evaluation Patient Details Name: Amanda Cordova MRN: [id number] DOB: [birth year] Today's Date: 05/29/2020    History of Present Illness Pt is a 60 y/o female who presents s/p ACDF C3-4 on 05/28/20. PMH includes CAD, COPD, HTN, optic neuritis, sleep apnea and acoustic neuroma.   Clinical Impression   Pt received in bed, demonstrated ability to complete bed mobility at modified independent level. Pt able to recall 2/3 precautions and demonstrated ability to complete ADL and functional mobility at modified independent level with use of AE and RW. Patient evaluated by Occupational Therapy with no further acute OT needs identified. All education has been completed and the patient has no further questions. See below for any follow-up Occupational Therapy or equipment needs. OT to sign off. Thank you for referral.      Follow Up Recommendations  No OT follow up    Equipment Recommendations  None recommended by OT    Recommendations for Other Services       Precautions / Restrictions Precautions Precautions: Cervical;Fall Precaution Booklet Issued: Yes (comment) Precaution Comments: Reviewed handout and pt was cued for precautions during functional mobility Required Braces or Orthoses: Cervical Brace Cervical Brace: Hard collar Restrictions Weight Bearing Restrictions: No      Mobility Bed Mobility Overal bed mobility: Modified Independent             General bed mobility comments: pt reports her bed at home is able to adjust, modified independent for supine<>sit    Transfers Overall transfer level: Modified independent Equipment used: Rolling walker (2 wheeled);Straight cane             General transfer comment: VC's for hand placement on seated surface for safety with RW, however demonstrated proper hand placement with SPC later in session.    Balance Overall balance assessment: Needs assistance Sitting-balance support: Feet supported;No  upper extremity supported Sitting balance-Leahy Scale: Fair     Standing balance support: No upper extremity supported;During functional activity Standing balance-Leahy Scale: Fair Standing balance comment: Better with UE support                           ADL either performed or assessed with clinical judgement   ADL Overall ADL's : Modified independent                                       General ADL Comments: demonstrated ability to complete ADL at modified independent level with use of AE and RW     Vision         Perception     Praxis      Pertinent Vitals/Pain Pain Assessment: 0-10 Pain Score: 5  Faces Pain Scale: Hurts little more Pain Location: Incision site Pain Descriptors / Indicators: Operative site guarding;Discomfort Pain Intervention(s): Limited activity within patient's tolerance;Monitored during session     Hand Dominance Right   Extremity/Trunk Assessment Upper Extremity Assessment Upper Extremity Assessment: Overall WFL for tasks assessed   Lower Extremity Assessment Lower Extremity Assessment: Overall WFL for tasks assessed   Cervical / Trunk Assessment Cervical / Trunk Assessment: Other exceptions Cervical / Trunk Exceptions: s/p surgery   Communication Communication 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)   Cognition Arousal/Alertness: Awake/alert Behavior During Therapy: WFL for tasks assessed/performed Overall Cognitive Status: Within Functional Limits for tasks assessed  General Comments: demonstrated good safety awareness, good adherence to precautions   General Comments  vss    Exercises     Shoulder Instructions      Home Living Family/patient expects to be discharged to:: Private residence Living Arrangements: Spouse/significant other Available Help at Discharge: Family;Available 24 hours/day Type of Home: House Home Access: Stairs to enter [employer] of Steps: 4   Home Layout: One level     Bathroom Shower/Tub: Teacher, early years/pre: Standard     Home Equipment: Environmental consultant - standard;Cane - single point          Prior Functioning/Environment Level of Independence: Independent with assistive device(s)        Comments: SPC PTA        OT Problem List: Decreased safety awareness;Decreased knowledge of precautions;Pain      OT Treatment/Interventions:      OT Goals(Current goals can be found in the care plan section) Acute Rehab OT Goals Patient Stated Goal: Home today OT Goal Formulation: With patient Time For Goal Achievement: 06/12/20 Potential to Achieve Goals: Good  OT Frequency:     Barriers to D/C:            Co-evaluation              AM-PAC OT "6 Clicks" Daily Activity     Outcome Measure Help from another person eating meals?: None Help from another person taking care of personal grooming?: None Help from another person toileting, which includes using toliet, bedpan, or urinal?: None Help from another person bathing (including washing, rinsing, drying)?: None Help from another person to put on and taking off regular upper body clothing?: None Help from another person to put on and taking off regular lower body clothing?: None 6 Click Score: 24   End of Session Equipment Utilized During Treatment: Rolling walker;Cervical collar Nurse Communication: Mobility status  Activity Tolerance: Patient tolerated treatment well Patient left: in bed;with call bell/phone within reach  OT Visit Diagnosis: Other abnormalities of gait and mobility (R26.89);Pain Pain - part of body:  (neck)                Time: 0347-4259 OT Time Calculation (min): 15 min Charges:  OT General Charges $OT Visit: 1 Visit OT Evaluation $OT Eval Low Complexity: [place] OTR/L Acute Rehabilitation Services Office: Joseph 05/29/2020, 10:47 AM

## 2020-05-29 NOTE — Discharge Instructions (Signed)

## 2020-05-29 NOTE — Progress Notes (Signed)
Patient is discharged from room 3C02 at this time. Alert and in stable condition. IV site d/c'd and instructions read to patient and spouse with understanding verbalized and all questions answered. Left unit via wheelchair with all belongings at side.  ?

## 2020-06-01 ENCOUNTER — Ambulatory Visit: Payer: Medicare HMO | Admitting: Physical Therapy

## 2020-06-05 ENCOUNTER — Encounter: Payer: Medicare HMO | Admitting: Physical Therapy

## 2020-06-08 ENCOUNTER — Ambulatory Visit: Payer: Medicare HMO | Admitting: Physical Therapy

## 2020-06-09 ENCOUNTER — Ambulatory Visit: Payer: Medicare HMO | Admitting: Neurology

## 2020-06-09 ENCOUNTER — Other Ambulatory Visit: Payer: Self-pay | Admitting: Family Medicine

## 2020-06-10 NOTE — Telephone Encounter (Signed)
Last filled 05/08/20

## 2020-06-11 ENCOUNTER — Telehealth: Payer: Self-pay | Admitting: Family Medicine

## 2020-06-11 ENCOUNTER — Encounter: Payer: Medicare HMO | Admitting: Physical Therapy

## 2020-06-11 NOTE — Telephone Encounter (Signed)
Left message for patient to call back and schedule Medicare Annual Wellness Visit (AWV) either virtually or in office. No detailed message left   Last AWV  No information  please schedule at anytime with [facility] Nurse Health Advisor 1 or 2   This should be a 45 minute visit. 

## 2020-06-15 ENCOUNTER — Ambulatory Visit: Payer: Medicare HMO | Admitting: Physical Therapy

## 2020-06-18 ENCOUNTER — Encounter: Payer: Medicare HMO | Admitting: Physical Therapy

## 2020-06-22 ENCOUNTER — Other Ambulatory Visit: Payer: Self-pay | Admitting: Family Medicine

## 2020-06-22 ENCOUNTER — Ambulatory Visit: Payer: Self-pay | Admitting: Surgery

## 2020-06-22 DIAGNOSIS — Z9189 Other specified personal risk factors, not elsewhere classified: Secondary | ICD-10-CM | POA: Diagnosis not present

## 2020-06-22 DIAGNOSIS — I2510 Atherosclerotic heart disease of native coronary artery without angina pectoris: Secondary | ICD-10-CM

## 2020-06-22 DIAGNOSIS — D241 Benign neoplasm of right breast: Secondary | ICD-10-CM | POA: Diagnosis not present

## 2020-06-22 DIAGNOSIS — D249 Benign neoplasm of unspecified breast: Secondary | ICD-10-CM | POA: Diagnosis not present

## 2020-06-22 DIAGNOSIS — Z803 Family history of malignant neoplasm of breast: Secondary | ICD-10-CM | POA: Diagnosis not present

## 2020-06-22 NOTE — H&P (Signed)
Amanda Cordova Appointment: 06/22/2020 11:00 AM Location: [facility] Patient #: [id number] DOB: 06-Nov-1960 Married / Language: Amanda Cordova / Race: White Female  History of Present Illness Amanda Moores A. Kohner Orlick MD; 06/22/2020 11:25 AM) Patient words: Patient presents for evaluation of bilateral nipple discharge. She's had a history of bilateral nipple discharge. It has been going on for many months. She was evaluated last month with mammography and underwent a right breast core biopsy which showed papilloma. Discharge was described as dark or green. It is since stopped since her neck fusion surgery about 3 weeks ago. She also complains of bilateral breast pain. Core biopsy showed papilloma on the right. 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 She has no mass lesion. She does complain of bilateral breast pain.                CLINICAL DATA: Patient has noted recent breast tenderness, involving the LATERAL quadrants of both breasts. Several weeks ago, she noted spontaneous dark nipple discharge from both breasts, when in the shower. History of benign circumareolar excisional biopsy in the RIGHT breast. Patient has 3 sisters. All have been diagnosed with breast cancer, 1 in her 55s, the second at age 47-50, and a third at age 74. The patient does not know any results of genetic testing.  Patient has an upcoming cervical spine surgery on February 10th.  EXAM: DIGITAL DIAGNOSTIC BILATERAL MAMMOGRAM WITH CAD AND TOMOSYNTHESIS  ULTRASOUND BILATERAL BREAST  TECHNIQUE: Bilateral digital diagnostic mammography and breast tomosynthesis was performed. Digital images of the breasts were evaluated with computer-aided detection. Targeted ultrasound examination of the Bilateral breast was performed.  COMPARISON: Previous exam(s).  ACR Breast Density Category d: The  breast tissue is extremely dense, which lowers the sensitivity of mammography.  FINDINGS: RIGHT BREAST:  Mammogram: Stable postsurgical change identified in the UPPER anterior portion of the RIGHT breast consistent previous benign excisional biopsy. No suspicious mass, distortion, or microcalcifications are identified to suggest presence of malignancy. Mammographic images were processed with CAD.  Ultrasound: Targeted ultrasound is performed, showing an oval mass with irregular margins in the 12 o'clock location of the RIGHT breast 1 centimeter from the nipple. Mass shows continuity with an adjacent duct and contains central hypoechoic components of lacks internal blood flow on Doppler evaluation. Mass measures 0.6 x 0.4 x 0.5 centimeters.  Evaluation of the RIGHT axilla is negative for adenopathy.  LEFT BREAST:  Mammogram: No suspicious mass, distortion, or microcalcifications are identified to suggest presence of malignancy. Mammographic images were processed with CAD.  Ultrasound: Targeted ultrasound is performed, showing normal appearing fibroglandular tissue in the retroareolar region of the LEFT breast.  IMPRESSION: 1. Indeterminate mass in the 12 o'clock location of the RIGHT breast possibly representing intraductal papilloma. 2. LEFT breast is negative. 3. Spontaneous bilateral nipple discharge warrants further evaluation. 4. Strong family history of breast cancer.  RECOMMENDATION: 1. Ultrasound-guided core biopsy of mass in the RIGHT breast 12 o'clock location, scheduled on 05/26/2020. 2. Recommend bilateral breast MRI. 3. Recommend surgical consultation to discuss options for bilateral nipple discharge.  I have discussed the findings and recommendations with the patient. If applicable, a reminder letter will be sent to the patient regarding the next appointment.  BI-RADS CATEGORY 4: Suspicious.   Electronically Signed By: Amanda Cordova M.D. On:  05/19/2020 08:50     Diagnosis Breast, right, needle core biopsy, 12 o'clock - FEATURES CONSISTENT WITH INTRADUCTAL  PAPILLOMA - SEE COMMENT.  The patient is a 60 year old female.   Past Surgical History Amanda Cordova, CMA; 06/22/2020 10:57 AM) Breast Biopsy Right. Gallbladder Surgery - Open Hysterectomy (not due to cancer) - Complete Knee Surgery Left. Shoulder Surgery Left. Spinal Surgery - Neck  Diagnostic Studies History Amanda Cordova, CMA; 06/22/2020 10:57 AM) Mammogram within last year  Allergies Amanda Cordova, CMA; 06/22/2020 10:58 AM) Gabapentin *CHEMICALS* traMADol HCl *ANALGESICS - OPIOID* Lisinopril *CHEMICALS*  Medication History (Amanda Cordova, CMA; 06/22/2020 10:59 AM) ALPRAZolam (0.25MG  Tablet, Oral) Active. Pravastatin Sodium (10MG  Tablet, Oral) Active. amLODIPine Besylate (5MG  Tablet, Oral) Active. Aspirin (81MG  Tablet, Oral) Active. Multi-Vitamin (Oral) Active. Medications Reconciled  Social History Amanda Cordova, CMA; 06/22/2020 10:57 AM) Caffeine use Coffee. No drug use Tobacco use Former smoker.  Family History Amanda Cordova, CMA; 06/22/2020 10:57 AM) Breast Cancer Sister. Diabetes Mellitus Sister. Heart Disease Father, Mother. Heart disease in female family member before age 62 Heart disease in female family member before age 20 Hypertension Brother, Daughter, Mother, Sister. Respiratory Condition Mother.  Pregnancy / Birth History Amanda Cordova, CMA; 06/22/2020 10:57 AM) Age at menarche 64 years. Age of menopause 43-50 Gravida 4 Maternal age 45-20 Para 4  Other Problems Amanda Cordova, CMA; 06/22/2020 10:57 AM) Back Pain Cholelithiasis Emphysema Of Lung High blood pressure Hypercholesterolemia Sleep Apnea     Review of Systems [facility] R. Cordova CMA; 06/22/2020 10:57 AM) General Not Present- Appetite Loss, Chills, Fatigue, Fever, Night Sweats, Weight Gain and  Weight Loss. Skin Not Present- Change in Wart/Mole, Dryness, Hives, Jaundice, New Lesions, Non-Healing Wounds, Rash and Ulcer. HEENT Present- Hearing Loss, Ringing in the Ears and Wears glasses/contact lenses. Not Present- Earache, Hoarseness, Nose Bleed, Oral Ulcers, Seasonal Allergies, Sinus Pain, Sore Throat, Visual Disturbances and Yellow Eyes. Respiratory Present- Snoring. Not Present- Bloody sputum, Chronic Cough, Difficulty Breathing and Wheezing. Breast Present- Nipple Discharge. Not Present- Breast Mass, Breast Pain and Skin Changes. Cardiovascular Not Present- Chest Pain, Difficulty Breathing Lying Down, Leg Cramps, Palpitations, Rapid Heart Rate, Shortness of Breath and Swelling of Extremities. Gastrointestinal Not Present- Abdominal Pain, Bloating, Bloody Stool, Change in Bowel Habits, Chronic diarrhea, Constipation, Difficulty Swallowing, Excessive gas, Gets full quickly at meals, Hemorrhoids, Indigestion, Nausea, Rectal Pain and Vomiting. Female Genitourinary Not Present- Frequency, Nocturia, Painful Urination, Pelvic Pain and Urgency. Musculoskeletal Present- Back Pain, Joint Pain and Joint Stiffness. Not Present- Muscle Pain, Muscle Weakness and Swelling of Extremities. Neurological Present- Headaches, Tingling and Trouble walking. Not Present- Decreased Memory, Fainting, Numbness, Seizures, Tremor and Weakness. Psychiatric Not Present- Anxiety, Bipolar, Change in Sleep Pattern, Depression, Fearful and Frequent crying. Endocrine Not Present- Cold Intolerance, Excessive Hunger, Hair Changes, Heat Intolerance, Hot flashes and New Diabetes. Hematology Not Present- Blood Thinners, Easy Bruising, Excessive bleeding, Gland problems, HIV and Persistent Infections.  Vitals [employer] R. Cordova CMA; 06/22/2020 10:56 AM) 06/22/2020 10:56 AM Weight: 243.5 lb Height: 64in Body Surface Area: 2.13 m Body Mass Index: 41.8 kg/m  Pulse: 78 (Regular)  BP: 132/86(Sitting, Left Arm,  Standard)        Physical Exam (Amanda Higley A. Lucious Zou MD; 06/22/2020 11:26 AM)  General Mental Status-Alert. General Appearance-Consistent with stated age. Hydration-Well hydrated. Voice-Normal.  Head and Neck Head-normocephalic, atraumatic with no lesions or palpable masses. Trachea-midline. Thyroid Gland Characteristics - normal size and consistency.  Chest and Lung Exam Chest and lung exam reveals -quiet, even and easy respiratory effort with no use of accessory muscles and on auscultation, normal breath sounds, no  adventitious sounds and normal vocal resonance. Inspection Chest Wall - Normal. Back - normal.  Breast Note: Large and pendulous breasts. There is clear discharge noted on the right which is minimal. Left has no discharge. No masses in either breast. Both tender to palpation.  Neurologic Neurologic evaluation reveals -alert and oriented x 3 with no impairment of recent or remote memory. Mental Status-Normal.  Lymphatic Head & Neck  General Head & Neck Lymphatics: Bilateral - Description - Normal. Axillary  General Axillary Region: Bilateral - Description - Normal. Tenderness - Non Tender.    Assessment & Plan (Amanda Cotroneo A. Raeley Gilmore MD; 06/22/2020 11:29 AM)  PAPILLOMA OF BREAST (D24.9) Impression: Recommend right breast seed localized lumpectomy. She needs further workup with magnetic resonance imaging, genetics and had discussion about high risk treatments and/or follow-up. Risk of lumpectomy include bleeding, infection, seroma, more surgery, use of seed/wire, wound care, cosmetic deformity and the need for other treatments, death , blood clots, death. Pt agrees to proceed.   PAPILLOMA OF RIGHT BREAST (D24.1)   FAMILY HISTORY OF BREAST CANCER (Z80.3)   AT HIGH RISK FOR BREAST CANCER (Z91.89) Impression: With history of atypical ductal hyperplasia, lifetime risk is over 20%. Discussed breast magnetic resonance imagings, risk reduction  surgery and risk reducing medications today since she is considered high risk. She will think things over. She does require lumpectomy at some point but I recommended magnetic resonance imaging to further evaluate both sides since she has a history of bilateral nipple discharge.   Total time 45 minutes for exam, counseling, chart review, review medications, reviewing x-ray and pathology reports and documentation  Current Plans Pt Education - [facility] Free Text Education/Instructions: discussed with patient and provided information. Pt Education - [facility] Breast Biopsy HCI: discussed with patient and provided information.

## 2020-06-24 ENCOUNTER — Other Ambulatory Visit: Payer: Self-pay | Admitting: Surgery

## 2020-06-24 DIAGNOSIS — D249 Benign neoplasm of unspecified breast: Secondary | ICD-10-CM

## 2020-06-26 ENCOUNTER — Telehealth: Payer: Self-pay | Admitting: Genetic Counselor

## 2020-06-26 NOTE — Telephone Encounter (Signed)
Received a genetic counseling referral from Dr. Brantley Stage for fhx of breast cancer. Pt has been cld and scheduled to see Raquel Sarna on 3/17 at 10am. Pt aware to arrive 20 minutes early.

## 2020-06-30 NOTE — Progress Notes (Signed)
Subjective:   Amanda Cordova is a 60 y.o. female who presents for Medicare Annual (Subsequent) preventive examination.  Review of Systems    N/A  Cardiac Risk Factors include: hypertension;dyslipidemia;obesity (BMI >30kg/m2)     Objective:    Today's Vitals   07/01/20 0948  BP: 140/80  Pulse: 67  Temp: 98.1 F (36.7 C)  TempSrc: Oral  SpO2: 97%  Weight: 242 lb 2 oz (109.8 kg)  Height: 5\' 4"  (1.626 m)   Body mass index is 41.56 kg/m.  Advanced Directives 07/01/2020 05/28/2020 05/25/2020 04/21/2020 01/10/2018 01/09/2018 06/08/2016  Does Patient Have a Medical Advance Directive? Yes Yes Yes Yes No No Yes  Type of Paramedic of [place];Living will [place];Living will [place];Living will - - - [place]  Does patient want to make changes to medical advance directive? No - Patient declined No - Patient declined No - Patient declined - - - -  Copy of [place] in Chart? - No - copy requested No - copy requested - - - No - copy requested  Would patient like information on creating a medical advance directive? - - - - No - Patient declined No - Patient declined -    Current Medications (verified) Outpatient Encounter Medications as of 07/01/2020  Medication Sig  . albuterol (VENTOLIN HFA) 108 (90 Base) MCG/ACT inhaler INHALE 2 PUFFS INTO THE LUNGS EVERY 6 HOURS AS NEEDED FOR WHEEZING OR SHORTNESS OF BREATH (Patient taking differently: Inhale 2 puffs into the lungs every 6 (six) hours as needed for wheezing or shortness of breath.)  . ALPRAZolam (XANAX) 0.25 MG tablet Take 1 tablet (0.25 mg total) by mouth daily as needed for anxiety.  Marland Kitchen amLODipine (NORVASC) 10 MG tablet Take 0.5 tablets (5 mg total) by mouth daily.  Marland Kitchen aspirin EC 81 MG tablet Take 1 tablet (81 mg total) by mouth daily. RESTART in 7 days  . ezetimibe (ZETIA) 10 MG tablet Take 1 tablet (10 mg total) by mouth daily.   . Fluticasone-Salmeterol (ADVAIR DISKUS) 250-50 MCG/DOSE AEPB Inhale 1 puff into the lungs 2 (two) times daily.  . methocarbamol (ROBAXIN) 500 MG tablet Take 1 tablet (500 mg total) by mouth every 8 (eight) hours as needed for muscle spasms.  . Multiple Vitamins-Minerals (CENTRUM SILVER PO) Take 1 tablet by mouth daily.  . pravastatin (PRAVACHOL) 10 MG tablet TAKE 1 TABLET(10 MG) BY MOUTH DAILY  . [DISCONTINUED] HYDROcodone-acetaminophen (NORCO/VICODIN) 5-325 MG tablet Take 1-2 tablets by mouth every 4 (four) hours as needed for moderate pain ((score 4 to 6)). (Patient not taking: Reported on 07/01/2020)   No facility-administered encounter medications on file as of 07/01/2020.    Allergies (verified) Gabapentin, Tramadol, Crestor [rosuvastatin], Duloxetine, and Lisinopril   History: Past Medical History:  Diagnosis Date  . Acoustic neuroma ([place])   . CAD (coronary artery disease)   . COPD (chronic obstructive pulmonary disease) ([place])   . Hyperlipemia   . Hypertension   . Neuropathy   . Optic neuritis   . Sleep apnea    wears cpap   Past Surgical History:  Procedure Laterality Date  . ABDOMINAL HYSTERECTOMY    . ANTERIOR CERVICAL DECOMP/DISCECTOMY FUSION N/A 05/28/2020   Procedure: ANTERIOR CERVICAL DECOMPRESSION FUSION CERVICAL THREE-FOUR.;  Surgeon: Karsten Ro, DO;  Location: [facility];  Service: Neurosurgery;  Laterality: N/A;  anterior  . BREAST EXCISIONAL BIOPSY Right 2000   benign  . cerical fusion  [place]    . 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 HYSTERECTOMY ABDOMINAL WITH SALPINGECTOMY    . MENISCUS REPAIR Left    Family History  Problem Relation Age of Onset  . Breast cancer Sister   . Diabetes Sister   . Cancer Sister   . COPD Mother   . Heart failure Mother   . Heart attack Father   . Diabetes Maternal Aunt   . Cancer Sister   . Breast cancer Sister   . Cancer Sister   . Breast cancer Sister    Social History   Socioeconomic  History  . Marital status: Married    Spouse name: Not on file  . Number of children: 4  . Years of education: 34  . Highest education level: High school graduate  Occupational History  . Occupation: Disabled  Tobacco Use  . Smoking status: Former Smoker    Packs/day: 1.00    Years: 35.00    Pack years: 35.00    Types: Cigarettes    Quit date: 01/09/2018    Years since quitting: 2.4  . Smokeless tobacco: Never Used  Substance and Sexual Activity  . Alcohol use: Not Currently  . Drug use: Never  . Sexual activity: Not on file  Other Topics Concern  . Not on file  Social History Narrative   Lives at home with her husband and her great-grandson.   Right-handed.   2-3 cups caffeine per day.    Social Determinants of Health   Financial Resource Strain: Low Risk   . Difficulty of Paying Living Expenses: Not hard at all  Food Insecurity: No Food Insecurity  . Worried About Charity fundraiser in the Last Year: Never true  . Ran Out of Food in the Last Year: Never true  Transportation Needs: No Transportation Needs  . Lack of Transportation (Medical): No  . Lack of Transportation (Non-Medical): No  Physical Activity: Sufficiently Active  . Days of Exercise per Week: 7 days  . Minutes of Exercise per Session: 30 min  Stress: No Stress Concern Present  . Feeling of Stress : Not at all  Social Connections: Moderately Isolated  . Frequency of Communication with Friends and Family: More than three times a week  . Frequency of Social Gatherings with Friends and Family: Once a week  . Attends Religious Services: Never  . Active Member of Clubs or Organizations: No  . Attends Archivist Meetings: Never  . Marital Status: Married    Tobacco Counseling Counseling given: Not Answered   Clinical Intake:  Pre-visit preparation completed: Yes  Pain : No/denies pain     Nutritional Risks: None Diabetes: No  How often do you need to have someone help you when you  read instructions, pamphlets, or other written materials from your doctor or pharmacy?: 1 - Never  Diabetic? No  Interpreter Needed?: No  Information entered by :: [place] of Daily Living In your present state of health, do you have any difficulty performing the following activities: 07/01/2020 05/28/2020  Hearing? 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 Y Y  Comment has blurry vision even with glasses -  Difficulty concentrating or making decisions? N N  Walking or climbing stairs? Y Y  Dressing or bathing? N Y  Doing errands, shopping? N N  Preparing Food and eating ? N -  Using the Toilet? N -  In  the past six months, have you accidently leaked urine? N -  Do you have problems with loss of bowel control? N -  Managing your Medications? N -  Managing your Finances? N -  Housekeeping or managing your Housekeeping? N -  Some recent data might be hidden    Patient Care Team: [name], [name], MD as PCP - General (Family Medicine) Stanford Breed Denice Bors, MD as PCP - Cardiology (Cardiology) Garrel Ridgel, DPM as Consulting Physician (Podiatry)  Indicate any recent Medical Services you may have received from other than [facility] providers in the past year (date may be approximate).     Assessment:   This is a routine wellness examination for Ilina.  Hearing/Vision screen  Hearing Screening   125Hz  250Hz  500Hz  1000Hz  2000Hz  3000Hz  4000Hz  6000Hz  8000Hz   Right ear:           Left ear:           Vision Screening Comments: Patient states she gets her eyes examined year. Wears glasses.   Dietary issues and exercise activities discussed: Current Exercise Habits: Home exercise routine, Type of exercise: walking, Time (Minutes): 35, Frequency (Times/Week): 7, Weekly Exercise (Minutes/Week): 245, Intensity: Mild  Goals    . Patient Stated     I would like to maintain where I am now and possibly go back to school       Depression Screen PHQ 2/9  Scores 07/01/2020 12/25/2019 05/09/2016  PHQ - 2 Score 0 0 2  PHQ- 9 Score - - 9    Fall Risk Fall Risk  07/01/2020 05/09/2016  Falls in the past year? 1 No  Number falls in past yr: 0 -  Injury with Fall? 0 -  Risk for fall due to : History of fall(s) Impaired balance/gait  Follow up Falls evaluation completed;Falls prevention discussed -    FALL RISK PREVENTION PERTAINING TO THE HOME:  Any stairs in or around the home? Yes  If so, are there any without handrails? No  Home free of loose throw rugs in walkways, pet beds, electrical cords, etc? Yes  Adequate lighting in your home to reduce risk of falls? Yes   ASSISTIVE DEVICES UTILIZED TO PREVENT FALLS:  Life alert? No  Use of a cane, walker or w/c? Yes  Grab bars in the bathroom? No  Shower chair or bench in shower? No  Elevated toilet seat or a handicapped toilet? No   TIMED UP AND GO:  Was the test performed? Yes .  Length of time to ambulate 10 feet: 4 sec.   Gait steady and fast with assistive device  Cognitive Function:   Normal cognitive status assessed by direct observation by this Nurse Health Advisor. No abnormalities found.        Immunizations Immunization History  Administered Date(s) Administered  . Influenza Split 02/02/2001, 03/06/2012, 12/11/2013  . Influenza-Unspecified 12/05/2012  . Td 09/06/2005    TDAP status: Due, Education has been provided regarding the importance of this vaccine. Advised may receive this vaccine at local pharmacy or Health Dept. Aware to provide a copy of the vaccination record if obtained from local pharmacy or Health Dept. Verbalized acceptance and understanding.  Flu Vaccine status: Declined, Education has been provided regarding the importance of this vaccine but patient still declined. Advised may receive this vaccine at local pharmacy or Health Dept. Aware to provide a copy of the vaccination record if obtained from local pharmacy or Health Dept. Verbalized acceptance and  understanding.  Pneumococcal vaccine  status: Up to date  Covid-19 vaccine status: Declined, Education has been provided regarding the importance of this vaccine but patient still declined. Advised may receive this vaccine at local pharmacy or Health Dept.or vaccine clinic. Aware to provide a copy of the vaccination record if obtained from local pharmacy or Health Dept. Verbalized acceptance and understanding.  Qualifies for Shingles Vaccine? Yes   Zostavax completed No   Shingrix Completed?: No.    Education has been provided regarding the importance of this vaccine. Patient has been advised to call insurance company to determine out of pocket expense if they have not yet received this vaccine. Advised may also receive vaccine at local pharmacy or Health Dept. Verbalized acceptance and understanding.  Screening Tests Health Maintenance  Topic Date Due  . Hepatitis C Screening  Never done  . COVID-19 Vaccine (1) Never done  . TETANUS/TDAP  11/14/2020 (Originally 09/07/2015)  . MAMMOGRAM  05/19/2021  . COLONOSCOPY (Pts 45-28yrs Insurance coverage will need to be confirmed)  04/18/2026  . HIV Screening  Completed  . HPV VACCINES  Aged Out  . INFLUENZA VACCINE  Discontinued  . PAP SMEAR-Modifier  Discontinued    Health Maintenance  Health Maintenance Due  Topic Date Due  . Hepatitis C Screening  Never done  . COVID-19 Vaccine (1) Never done    Colorectal cancer screening: Type of screening: Colonoscopy. Completed 04/18/2016. Repeat every 10 years  Mammogram status: Completed 05/19/2020. Repeat every year  Bone Density status: Not due until age 36  Lung Cancer Screening: (Low Dose CT Chest recommended if Age 71-80 years, 30 pack-year currently smoking OR have quit w/in 15years.) does qualify.   Lung Cancer Screening Referral: No  Additional Screening:  Hepatitis C Screening: does qualify;   Vision Screening: Recommended annual ophthalmology exams for early detection of  glaucoma and other disorders of the eye. Is the patient up to date with their annual eye exam?  Yes  Who is the provider or what is the name of the office in which the patient attends annual eye exams? [facility] If pt is not established with a provider, would they like to be referred to a provider to establish care? No .   Dental Screening: Recommended annual dental exams for proper oral hygiene  Community Resource Referral / Chronic Care Management: CRR required this visit?  No   CCM required this visit?  No      Plan:     I have personally reviewed and noted the following in the patient's chart:   . Medical and social history . Use of alcohol, tobacco or illicit drugs  . Current medications and supplements . Functional ability and status . Nutritional status . Physical activity . Advanced directives . List of other physicians . Hospitalizations, surgeries, and ER visits in previous 12 months . Vitals . Screenings to include cognitive, depression, and falls . Referrals and appointments  In addition, I have reviewed and discussed with patient certain preventive protocols, quality metrics, and best practice recommendations. A written personalized care plan for preventive services as well as general preventive health recommendations were provided to patient.     Ofilia Neas, LPN   [id number]   Nurse Notes: None

## 2020-07-01 ENCOUNTER — Other Ambulatory Visit: Payer: Self-pay

## 2020-07-01 ENCOUNTER — Ambulatory Visit (INDEPENDENT_AMBULATORY_CARE_PROVIDER_SITE_OTHER): Payer: Medicare HMO

## 2020-07-01 VITALS — BP 140/80 | HR 67 | Temp 98.1°F | Ht 64.0 in | Wt 242.1 lb

## 2020-07-01 DIAGNOSIS — Z0000 Encounter for general adult medical examination without abnormal findings: Secondary | ICD-10-CM | POA: Diagnosis not present

## 2020-07-01 NOTE — Patient Instructions (Signed)
Amanda Cordova , Thank you for taking time to come for your Medicare Wellness Visit. I appreciate your ongoing commitment to your health goals. Please review the following plan we discussed and let me know if I can assist you in the future.   Screening recommendations/referrals: Colonoscopy: Up to date, next due 04/18/2026 Mammogram: Up to date, next due 05/19/2021 Bone Density: Not due until age 62  Recommended yearly ophthalmology/optometry visit for glaucoma screening and checkup Recommended yearly dental visit for hygiene and checkup  Vaccinations: Influenza vaccine: Patient declined  Pneumococcal vaccine: Not due until 60 unless provider recommends  Tdap vaccine: Currently due, you may await for an injury. Shingles vaccine: Currently due for shingrix, if you would like to receive we recommend that you do so at your local pharmacy so that we may scan into your chart.    Advanced directives:  Please bring in a copy of your advanced medical directives so that we may scan into your chart.   Conditions/risks identified: None   Next appointment: 08/20/2020 @ 8:00 am with Dr. [name]   Preventive Care 40-64 Years, Female Preventive care refers to lifestyle choices and visits with your health care provider that can promote health and wellness. What does preventive care include?  A yearly physical exam. This is also called an annual well check.  Dental exams once or twice a year.  Routine eye exams. Ask your health care provider how often you should have your eyes checked.  Personal lifestyle choices, including:  Daily care of your teeth and gums.  Regular physical activity.  Eating a healthy diet.  Avoiding tobacco and drug use.  Limiting alcohol use.  Practicing safe sex.  Taking low-dose aspirin daily starting at age 47.  Taking vitamin and mineral supplements as recommended by your health care provider. What happens during an annual well check? The services and  screenings done by your health care provider during your annual well check will depend on your age, overall health, lifestyle risk factors, and family history of disease. Counseling  Your health care provider may ask you questions about your:  Alcohol use.  Tobacco use.  Drug use.  Emotional well-being.  Home and relationship well-being.  Sexual activity.  Eating habits.  Work and work Statistician.  Method of birth control.  Menstrual cycle.  Pregnancy history. Screening  You may have the following tests or measurements:  Height, weight, and BMI.  Blood pressure.  Lipid and cholesterol levels. These may be checked every 5 years, or more frequently if you are over 85 years old.  Skin check.  Lung cancer screening. You may have this screening every year starting at age 65 if you have a 30-pack-year history of smoking and currently smoke or have quit within the past 15 years.  Fecal occult blood test (FOBT) of the stool. You may have this test every year starting at age 26.  Flexible sigmoidoscopy or colonoscopy. You may have a sigmoidoscopy every 5 years or a colonoscopy every 10 years starting at age 28.  Hepatitis C blood test.  Hepatitis B blood test.  Sexually transmitted disease (STD) testing.  Diabetes screening. This is done by checking your blood sugar (glucose) after you have not eaten for a while (fasting). You may have this done every 1-3 years.  Mammogram. This may be done every 1-2 years. Talk to your health care provider about when you should start having regular mammograms. This may depend on whether you have a family history of breast cancer.  BRCA-related cancer screening. This may be done if you have a family history of breast, ovarian, tubal, or peritoneal cancers.  Pelvic exam and Pap test. This may be done every 3 years starting at age 57. Starting at age 67, this may be done every 5 years if you have a Pap test in combination with an HPV  test.  Bone density scan. This is done to screen for osteoporosis. You may have this scan if you are at high risk for osteoporosis. Discuss your test results, treatment options, and if necessary, the need for more tests with your health care provider. Vaccines  Your health care provider may recommend certain vaccines, such as:  Influenza vaccine. This is recommended every year.  Tetanus, diphtheria, and acellular pertussis (Tdap, Td) vaccine. You may need a Td booster every 10 years.  Zoster vaccine. You may need this after age 96.  Pneumococcal 13-valent conjugate (PCV13) vaccine. You may need this if you have certain conditions and were not previously vaccinated.  Pneumococcal polysaccharide (PPSV23) vaccine. You may need one or two doses if you smoke cigarettes or if you have certain conditions. Talk to your health care provider about which screenings and vaccines you need and how often you need them. This information is not intended to replace advice given to you by your health care provider. Make sure you discuss any questions you have with your health care provider. Document Released: 05/01/2015 Document Revised: 12/23/2015 Document Reviewed: 02/03/2015 Elsevier Interactive Patient Education  2017 [place] Prevention in the Home Falls can cause injuries. They can happen to people of all ages. There are many things you can do to make your home safe and to help prevent falls. What can I do on the outside of my home?  Regularly fix the edges of walkways and driveways and fix any cracks.  Remove anything that might make you trip as you walk through a door, such as a raised step or threshold.  Trim any bushes or trees on the path to your home.  Use bright outdoor lighting.  Clear any walking paths of anything that might make someone trip, such as rocks or tools.  Regularly check to see if handrails are loose or broken. Make sure that both sides of any steps have  handrails.  Any raised decks and porches should have guardrails on the edges.  Have any leaves, snow, or ice cleared regularly.  Use sand or salt on walking paths during winter.  Clean up any spills in your garage right away. This includes oil or grease spills. What can I do in the bathroom?  Use night lights.  Install grab bars by the toilet and in the tub and shower. Do not use towel bars as grab bars.  Use non-skid mats or decals in the tub or shower.  If you need to sit down in the shower, use a plastic, non-slip stool.  Keep the floor dry. Clean up any water that spills on the floor as soon as it happens.  Remove soap buildup in the tub or shower regularly.  Attach bath mats securely with double-sided non-slip rug tape.  Do not have throw rugs and other things on the floor that can make you trip. What can I do in the bedroom?  Use night lights.  Make sure that you have a light by your bed that is easy to reach.  Do not use any sheets or blankets that are too big for your bed. They  should not hang down onto the floor.  Have a firm chair that has side arms. You can use this for support while you get dressed.  Do not have throw rugs and other things on the floor that can make you trip. What can I do in the kitchen?  Clean up any spills right away.  Avoid walking on wet floors.  Keep items that you use a lot in easy-to-reach places.  If you need to reach something above you, use a strong step stool that has a grab bar.  Keep electrical cords out of the way.  Do not use floor polish or wax that makes floors slippery. If you must use wax, use non-skid floor wax.  Do not have throw rugs and other things on the floor that can make you trip. What can I do with my stairs?  Do not leave any items on the stairs.  Make sure that there are handrails on both sides of the stairs and use them. Fix handrails that are broken or loose. Make sure that handrails are as long as  the stairways.  Check any carpeting to make sure that it is firmly attached to the stairs. Fix any carpet that is loose or worn.  Avoid having throw rugs at the top or bottom of the stairs. If you do have throw rugs, attach them to the floor with carpet tape.  Make sure that you have a light switch at the top of the stairs and the bottom of the stairs. If you do not have them, ask someone to add them for you. What else can I do to help prevent falls?  Wear shoes that:  Do not have high heels.  Have rubber bottoms.  Are comfortable and fit you well.  Are closed at the toe. Do not wear sandals.  If you use a stepladder:  Make sure that it is fully opened. Do not climb a closed stepladder.  Make sure that both sides of the stepladder are locked into place.  Ask someone to hold it for you, if possible.  Clearly mark and make sure that you can see:  Any grab bars or handrails.  First and last steps.  Where the edge of each step is.  Use tools that help you move around (mobility aids) if they are needed. These include:  Canes.  Walkers.  Scooters.  Crutches.  Turn on the lights when you go into a dark area. Replace any light bulbs as soon as they burn out.  Set up your furniture so you have a clear path. Avoid moving your furniture around.  If any of your floors are uneven, fix them.  If there are any pets around you, be aware of where they are.  Review your medicines with your doctor. Some medicines can make you feel dizzy. This can increase your chance of falling. Ask your doctor what other things that you can do to help prevent falls. This information is not intended to replace advice given to you by your health care provider. Make sure you discuss any questions you have with your health care provider. Document Released: 01/29/2009 Document Revised: 09/10/2015 Document Reviewed: 05/09/2014 Elsevier Interactive Patient Education  2017 [employer].

## 2020-07-02 ENCOUNTER — Inpatient Hospital Stay: Payer: Medicare HMO

## 2020-07-02 ENCOUNTER — Inpatient Hospital Stay: Payer: Medicare HMO | Attending: Genetic Counselor | Admitting: Genetic Counselor

## 2020-07-02 DIAGNOSIS — Z803 Family history of malignant neoplasm of breast: Secondary | ICD-10-CM

## 2020-07-02 DIAGNOSIS — Z8052 Family history of malignant neoplasm of bladder: Secondary | ICD-10-CM | POA: Diagnosis not present

## 2020-07-02 DIAGNOSIS — Z808 Family history of malignant neoplasm of other organs or systems: Secondary | ICD-10-CM | POA: Diagnosis not present

## 2020-07-03 ENCOUNTER — Encounter: Payer: Self-pay | Admitting: Genetic Counselor

## 2020-07-03 DIAGNOSIS — Z8052 Family history of malignant neoplasm of bladder: Secondary | ICD-10-CM | POA: Insufficient documentation

## 2020-07-03 DIAGNOSIS — Z803 Family history of malignant neoplasm of breast: Secondary | ICD-10-CM | POA: Insufficient documentation

## 2020-07-03 DIAGNOSIS — Z808 Family history of malignant neoplasm of other organs or systems: Secondary | ICD-10-CM | POA: Insufficient documentation

## 2020-07-03 NOTE — Progress Notes (Signed)
REFERRING PROVIDER: Erroll Luna, MD [address] [place] [address]  PRIMARY PROVIDER:  [name], [name], MD  PRIMARY REASON FOR VISIT:  1. Family history of breast cancer   2. Family history of bladder cancer   3. Family history of thyroid cancer      HISTORY OF PRESENT ILLNESS:   Amanda Cordova, a 60 y.o. female, was seen for a [facility] cancer genetics consultation at the request of Dr. Brantley Stage due to a family history of cancer.  [name] presents to clinic today to discuss the possibility of a hereditary predisposition to cancer, genetic testing, and to further clarify her future cancer risks, as well as potential cancer risks for family members.   [name] does not have a personal history of cancer. 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   RISK FACTORS:  Menarche was at age 46.  First live birth at age 27.  Ovaries intact: no.  Hysterectomy: yes.  Menopausal status: postmenopausal.  HRT use: 0 years. Mammogram within the last year: yes. Number of breast biopsies: 2.   Past Medical History:  Diagnosis Date  . Acoustic neuroma ([place])   . CAD (coronary artery disease)   . COPD (chronic obstructive pulmonary disease) ([place])   . Family history of bladder cancer   . Family history of breast cancer   . Family history of thyroid cancer   . Hyperlipemia   . Hypertension   . Neuropathy   . Optic neuritis   . Sleep apnea    wears cpap    Past Surgical History:  Procedure Laterality Date  . ABDOMINAL HYSTERECTOMY    . ANTERIOR CERVICAL DECOMP/DISCECTOMY FUSION N/A 05/28/2020   Procedure: ANTERIOR CERVICAL DECOMPRESSION FUSION CERVICAL THREE-FOUR.;  Surgeon: Karsten Ro, DO;  Location: [facility];  Service: Neurosurgery;  Laterality: N/A;  anterior  . BREAST EXCISIONAL BIOPSY Right 2000   benign  .  cerical fusion  1995  . CHOLECYSTECTOMY    . 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 HYSTERECTOMY ABDOMINAL WITH SALPINGECTOMY    . MENISCUS REPAIR Left     Social History   Socioeconomic History  . Marital status: Married    Spouse name: Not on file  . Number of children: 4  . Years of education: 34  . Highest education level: High school graduate  Occupational History  . Occupation: Disabled  Tobacco Use  . Smoking status: Former Smoker    Packs/day: 1.00    Years: 35.00    Pack years: 35.00    Types: Cigarettes    Quit date: 01/09/2018    Years since quitting: 2.4  . Smokeless tobacco: Never Used  Substance and Sexual Activity  . Alcohol use: Not Currently  . Drug use: Never  . Sexual activity: Not on file  Other Topics Concern  . Not on file  Social History Narrative   Lives at home with her husband and her great-grandson.   Right-handed.   2-3 cups caffeine per day.    Social Determinants of Health   Financial Resource Strain: Low Risk   . Difficulty of Paying Living Expenses: Not hard at all  Food Insecurity: No Food Insecurity  . Worried About Charity fundraiser in the Last Year: Never true  . Ran Out of Food in the Last Year: Never true  Transportation Needs: No Transportation Needs  . Lack of Transportation (Medical): No  . Lack of Transportation (Non-Medical): No  Physical Activity: Sufficiently Active  . Days of Exercise per Week: 7 days  . Minutes of Exercise per Session: 30 min  Stress: No Stress Concern Present  . Feeling of Stress : Not at all  Social Connections: Moderately Isolated  . Frequency of Communication with Friends and Family: More than three times a week  . Frequency of Social Gatherings with Friends and Family: Once a week  . Attends Religious Services: Never  . Active Member of Clubs or Organizations: No  . Attends Archivist Meetings: Never  . Marital Status: Married     FAMILY HISTORY:  We obtained a  detailed, 4-generation family history.  Significant diagnoses are listed below: Family History  Problem Relation Age of Onset  . Breast cancer Sister   . Diabetes Sister 67       negative genetic test (VUS in CHEK2 c.1420C>T possibly mosaic)  . COPD Mother   . Heart failure Mother   . Heart attack Father   . Diabetes Maternal Aunt   . Breast cancer Sister 2  . Breast cancer Sister        dx 80s  . Bladder Cancer Maternal Uncle        dx >50  . Bladder Cancer Other        dx 15s (mother's first cousin)  . Thyroid cancer Other        dx 86s (mother's first cousin)   [name] has one daughter and three sons, all in their 77s. She has one brother (in his 88s) and three sisters (ages 21s-60s). All three sisters have had breast cancer - one at age 87 Sharyn Lull), one at age 60 Arbie Cookey), and one in her 49s Maudie Mercury). 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  [name]'s mother died at age 31 without cancer. There was one maternal aunt and one maternal uncle. Her uncle was diagnosed with bladder cancer older than 27. There is no known cancer among maternal cousins. [name]'s maternal grandparents died older than 29 without cancer. She notes a maternal cousin once removed (mother's first cousin) who was diagnosed with bladder cancer and thyroid cancer in her 47s.  [name]'s father died at age 53 without cancer. [name] does not have any additional information about her paternal relatives, as her father became an orphan at age 13.  [name] is aware of previous family history of genetic testing for hereditary cancer risks. Patient's maternal ancestors are of [place] descent, and paternal ancestors are of unknown descent. There is no reported Ashkenazi Jewish ancestry. There is no known consanguinity.  GENETIC COUNSELING ASSESSMENT: [name] is a 60 y.o. female with a  family history of breast cancer, bladder cancer, and thyroid cancer, which is somewhat suggestive of a hereditary cancer syndrome and predisposition to cancer. We, therefore, discussed and recommended the following at today's visit.   DISCUSSION: We discussed that approximately 5-10% of breast cancer is hereditary, with most cases associated with the BRCA1 and BRCA2 genes. There are other genes that can be associated with hereditary breast cancer syndromes. These include ATM, CHEK2, PALB2, etc. We discussed that testing is beneficial for several reasons, including knowing about other cancer risks, identifying potential screening and risk-reduction options that may be appropriate, and to understand if other family  members could be at risk for cancer and allow them to undergo genetic testing.   We reviewed the characteristics, features and inheritance patterns of hereditary cancer syndromes. We also discussed genetic testing, including the appropriate family members to test, the process of testing, insurance coverage and turn-around-time for results. We discussed the implications of a negative, positive and/or variant of uncertain significant result. We recommended Ms. Uselman pursue genetic testing for the [facility] Multi-Cancer + RNA gene panel.   The Multi-Cancer + RNA Panel offered by [facility] includes sequencing and/or deletion/duplication analysis of the following 84 genes:  AIP*, ALK, APC*, ATM*, AXIN2*, BAP1*, BARD1*, BLM*, BMPR1A*, BRCA1*, BRCA2*, BRIP1*, CASR, CDC73*, CDH1*, CDK4, CDKN1B*, CDKN1C*, CDKN2A, CEBPA, CHEK2*, CTNNA1*, DICER1*, DIS3L2*, EGFR, EPCAM, FH*, FLCN*, GATA2*, GPC3, GREM1, HOXB13, HRAS, KIT, MAX*, MEN1*, MET, MITF, MLH1*, MSH2*, MSH3*, MSH6*, MUTYH*, NBN*, NF1*, NF2*, NTHL1*, PALB2*, PDGFRA, PHOX2B, PMS2*, POLD1*, POLE*, POT1*, PRKAR1A*, PTCH1*, PTEN*, RAD50*, RAD51C*, RAD51D*, RB1*, RECQL4, RET, RUNX1*, SDHA*, SDHAF2*, SDHB*, SDHC*, SDHD*, SMAD4*, SMARCA4*, SMARCB1*, SMARCE1*,  STK11*, SUFU*, TERC, TERT, TMEM127*, Tp53*, TSC1*, TSC2*, VHL*, WRN*, and WT1.  RNA analysis is performed for * genes.   Based on Ms. Jaworowski's family history of cancer, she meets medical criteria for genetic testing. Despite that she meets criteria, there may still be an out of pocket cost. We discussed that if her out of pocket cost for testing is over $100, the laboratory will reach out to let her know. If the out of pocket cost of testing is less than $100 she will be billed by the genetic testing laboratory.   PLAN: After considering the risks, benefits, and limitations, Ms. Mcconnell provided informed consent to pursue genetic testing and the blood sample was sent to [facility] for analysis of the Multi-Cancer + RNA panel. Results should be available within approximately two-three weeks' time, at which point they will be disclosed by telephone to Ms. Forker, as will any additional recommendations warranted by these results. Ms. Kaner will receive a summary of her genetic counseling visit and a copy of her results once available. This information will also be available in Epic.   Ms. Guterrez's questions were answered to her satisfaction today. Our contact information was provided should additional questions or concerns arise. Thank you for the referral and allowing [place] to share in the care of your patient.   Clint Guy, [place], [facility] Licensed, Certified Dispensing [email or web address] Phone: [phone]  The patient was seen for a total of 40 minutes in face-to-face genetic counseling.  This patient was discussed with Drs. Magrinat, Lindi Adie and/or Burr Medico who agrees with the above.    _______________________________________________________________________ For Office Staff:  Number of people involved in session: 1 Was an Intern/ student involved with case: no

## 2020-07-13 ENCOUNTER — Other Ambulatory Visit: Payer: Self-pay

## 2020-07-13 ENCOUNTER — Ambulatory Visit: Payer: Medicare HMO | Admitting: Podiatry

## 2020-07-13 ENCOUNTER — Encounter: Payer: Self-pay | Admitting: Podiatry

## 2020-07-13 DIAGNOSIS — M79675 Pain in left toe(s): Secondary | ICD-10-CM

## 2020-07-13 DIAGNOSIS — M8580 Other specified disorders of bone density and structure, unspecified site: Secondary | ICD-10-CM | POA: Insufficient documentation

## 2020-07-13 DIAGNOSIS — B351 Tinea unguium: Secondary | ICD-10-CM | POA: Diagnosis not present

## 2020-07-13 DIAGNOSIS — G5760 Lesion of plantar nerve, unspecified lower limb: Secondary | ICD-10-CM | POA: Insufficient documentation

## 2020-07-13 DIAGNOSIS — E559 Vitamin D deficiency, unspecified: Secondary | ICD-10-CM | POA: Insufficient documentation

## 2020-07-13 DIAGNOSIS — Q828 Other specified congenital malformations of skin: Secondary | ICD-10-CM | POA: Diagnosis not present

## 2020-07-13 DIAGNOSIS — M79674 Pain in right toe(s): Secondary | ICD-10-CM | POA: Diagnosis not present

## 2020-07-13 DIAGNOSIS — E785 Hyperlipidemia, unspecified: Secondary | ICD-10-CM | POA: Insufficient documentation

## 2020-07-13 DIAGNOSIS — G5793 Unspecified mononeuropathy of bilateral lower limbs: Secondary | ICD-10-CM | POA: Diagnosis not present

## 2020-07-13 DIAGNOSIS — G4733 Obstructive sleep apnea (adult) (pediatric): Secondary | ICD-10-CM | POA: Insufficient documentation

## 2020-07-13 DIAGNOSIS — G4700 Insomnia, unspecified: Secondary | ICD-10-CM | POA: Insufficient documentation

## 2020-07-13 DIAGNOSIS — R7303 Prediabetes: Secondary | ICD-10-CM | POA: Insufficient documentation

## 2020-07-13 DIAGNOSIS — Z8669 Personal history of other diseases of the nervous system and sense organs: Secondary | ICD-10-CM | POA: Insufficient documentation

## 2020-07-14 NOTE — Progress Notes (Signed)
  Subjective:  Patient ID: Amanda Cordova, female    DOB: [birth year],  MRN: [id number]  60 y.o. female presents at risk foot care with history of peripheral neuropathy and callus(es) right heel and painful thick toenails that are difficult to trim. Painful toenails interfere with ambulation. Aggravating factors include wearing enclosed shoe gear. Pain is relieved with periodic professional debridement. Painful calluses are aggravated when weightbearing with shoegear which rubs it. Pain is relieved with periodic professional debridement.  Pain interferes with ambulation.   Allergies  Allergen Reactions  . Gabapentin Anaphylaxis and Swelling    Throat and tongue swells Throat and tongue swells Other reaction(s): eyes swelled, throat, hands,  . Tramadol Swelling    Throat and hands  . Crestor [Rosuvastatin] Other (See Comments)  . Duloxetine Nausea And Vomiting  . Tramadol Hcl     Other reaction(s): nausea/dizzy/blurry vision/hands tingling  . Lisinopril Nausea Only   Review of Systems: Negative except as noted in the HPI.  Objective:   Constitutional Pt is a pleasant 60 y.o. Caucasian female morbidly obese in NAD. AAO x 3.   Vascular Capillary refill time to digits immediate b/l. Palpable pedal pulses b/l LE. Pedal hair present. Lower extremity skin temperature gradient within normal limits. No pain with calf compression b/l. No edema noted b/l lower extremities.  Neurologic Pt has subjective symptoms of neuropathy. Protective sensation intact 5/5 intact bilaterally with 10g monofilament b/l. Vibratory sensation intact b/l. Clonus negative b/l.  Dermatologic Pedal skin with normal turgor, texture and tone bilaterally. No open wounds bilaterally. No interdigital macerations bilaterally. Toenails 1-5 b/l elongated, discolored, dystrophic, thickened, crumbly with subungual debris and tenderness to dorsal palpation. Porokeratotic lesion(s) posterior aspect of heel right foot. No erythema,  no edema, no drainage, no fluctuance.  Orthopedic: Normal muscle strength 5/5 to all lower extremity muscle groups bilaterally. No pain crepitus or joint limitation noted with ROM b/l. No gross bony deformities bilaterally.   Radiographs: None Assessment:  No diagnosis found. Plan:  Patient was evaluated and treated and all questions answered.  Onychomycosis with pain -Nails palliatively debridement as below. -Educated on self-care  Procedure: Nail Debridement Rationale: Pain Type of Debridement: manual, sharp debridement. Instrumentation: Nail nipper, rotary burr. Number of Nails: 10  -Examined patient. -Patient to continue soft, supportive shoe gear daily. -Porokeratotic lesion pared and enucleated without incident right heel. Number of lesions=1. -Toenails 1-5 b/l were debrided in length and girth with sterile nail nippers and dremel without iatrogenic bleeding.  -Patient to report any pedal injuries to medical professional immediately. -Patient/POA to call should there be question/concern in the interim.  Return in about 3 months (around 10/13/2020).  Marzetta Board, DPM

## 2020-07-16 ENCOUNTER — Other Ambulatory Visit: Payer: Self-pay | Admitting: Family Medicine

## 2020-07-19 ENCOUNTER — Other Ambulatory Visit: Payer: Self-pay

## 2020-07-19 ENCOUNTER — Ambulatory Visit
Admission: RE | Admit: 2020-07-19 | Discharge: 2020-07-19 | Disposition: A | Discharge status: Home / Self Care | Payer: Medicare HMO | Source: Ambulatory Visit | Attending: Surgery | Admitting: Surgery

## 2020-07-19 DIAGNOSIS — D249 Benign neoplasm of unspecified breast: Secondary | ICD-10-CM

## 2020-07-19 DIAGNOSIS — D241 Benign neoplasm of right breast: Secondary | ICD-10-CM | POA: Diagnosis not present

## 2020-07-19 MED ORDER — GADOBUTROL 1 MMOL/ML IV SOLN
10.0000 mL | Freq: Once | INTRAVENOUS | Status: AC | PRN
  Administered 2020-07-19: 10 mL via INTRAVENOUS

## 2020-07-20 DIAGNOSIS — Z981 Arthrodesis status: Secondary | ICD-10-CM | POA: Diagnosis not present

## 2020-07-21 DIAGNOSIS — Z1379 Encounter for other screening for genetic and chromosomal anomalies: Secondary | ICD-10-CM | POA: Insufficient documentation

## 2020-07-21 DIAGNOSIS — Z0000 Encounter for general adult medical examination without abnormal findings: Secondary | ICD-10-CM | POA: Insufficient documentation

## 2020-07-22 ENCOUNTER — Telehealth: Payer: Self-pay | Admitting: Genetic Counselor

## 2020-07-22 ENCOUNTER — Ambulatory Visit: Payer: Medicare HMO | Attending: Neurology

## 2020-07-22 ENCOUNTER — Other Ambulatory Visit: Payer: Self-pay | Admitting: Surgery

## 2020-07-22 ENCOUNTER — Other Ambulatory Visit: Payer: Self-pay

## 2020-07-22 ENCOUNTER — Ambulatory Visit: Payer: Self-pay | Admitting: Genetic Counselor

## 2020-07-22 DIAGNOSIS — M542 Cervicalgia: Secondary | ICD-10-CM | POA: Insufficient documentation

## 2020-07-22 DIAGNOSIS — M6281 Muscle weakness (generalized): Secondary | ICD-10-CM | POA: Diagnosis not present

## 2020-07-22 DIAGNOSIS — R2689 Other abnormalities of gait and mobility: Secondary | ICD-10-CM | POA: Diagnosis not present

## 2020-07-22 DIAGNOSIS — Z1379 Encounter for other screening for genetic and chromosomal anomalies: Secondary | ICD-10-CM

## 2020-07-22 DIAGNOSIS — R9389 Abnormal findings on diagnostic imaging of other specified body structures: Secondary | ICD-10-CM

## 2020-07-22 DIAGNOSIS — R29898 Other symptoms and signs involving the musculoskeletal system: Secondary | ICD-10-CM | POA: Diagnosis not present

## 2020-07-22 DIAGNOSIS — Z9181 History of falling: Secondary | ICD-10-CM | POA: Insufficient documentation

## 2020-07-22 DIAGNOSIS — R2681 Unsteadiness on feet: Secondary | ICD-10-CM | POA: Diagnosis not present

## 2020-07-22 NOTE — Telephone Encounter (Signed)
Revealed negative genetic testing. Discussed that we do not know why there is cancer in the family. There could be a genetic mutation in the family that Amanda Cordova did not inherit. There could also be a mutation in a different gene that we are not testing, or our current technology may not be able to detect certain mutations. It will therefore be important for her to stay in contact with genetics to keep up with whether additional testing may be appropriate in the future.

## 2020-07-22 NOTE — Therapy (Signed)
OUTPATIENT PHYSICAL THERAPY NEURO EVALUATION   Patient Name: Amanda Cordova MRN: [id number] DOB:28-Jun-1960, 60 y.o., female Today's Date: 07/22/2020  PCP: [name], [name], MD REFERRING PROVIDER: Karsten Ro, DO    PT End of Session - 07/22/20 [phone]    Visit Number 1    Number of Visits 10    Date for PT Re-Evaluation 09/30/20    Authorization Type [employer]    Progress Note Due on Visit 10    PT Start Time 0805    PT Stop Time 0850    PT Time Calculation (min) 45 min    Activity Tolerance Patient tolerated treatment well    Behavior During Therapy [facility] for tasks assessed/performed           Past Medical History:  Diagnosis Date  . Acoustic neuroma ([place])   . CAD (coronary artery disease)   . COPD (chronic obstructive pulmonary disease) ([place])   . Family history of bladder cancer   . Family history of breast cancer   . Family history of thyroid cancer   . Hyperlipemia   . Hypertension   . Neuropathy   . Optic neuritis   . Sleep apnea    wears cpap   Past Surgical History:  Procedure Laterality Date  . ABDOMINAL HYSTERECTOMY    . ANTERIOR CERVICAL DECOMP/DISCECTOMY FUSION N/A 05/28/2020   Procedure: ANTERIOR CERVICAL DECOMPRESSION FUSION CERVICAL THREE-FOUR.;  Surgeon: Karsten Ro, DO;  Location: [place];  Service: Neurosurgery;  Laterality: N/A;  anterior  . BREAST EXCISIONAL BIOPSY Right 2000   benign  . cerical fusion  1995  . CHOLECYSTECTOMY    . 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 HYSTERECTOMY ABDOMINAL WITH SALPINGECTOMY    . MENISCUS REPAIR Left    Patient Active Problem List   Diagnosis Date Noted  . History of optic neuritis 07/13/2020  . Hyperlipidemia 07/13/2020  . Insomnia 07/13/2020  . Obstructive sleep apnea syndrome 07/13/2020  . Osteopenia 07/13/2020  . Plantar nerve lesion 07/13/2020  . Prediabetes 07/13/2020  . Vitamin D deficiency 07/13/2020  . Family history of breast cancer   . Family history of bladder cancer    . Family history of thyroid cancer   . Cervical myelopathy ([place]) 05/28/2020  . Cerebral vascular disease 04/01/2020  . History of acoustic neuroma 04/01/2020  . Cervical stenosis (uterine cervix) 04/01/2020  . Paresthesia 03/05/2020  . Gait abnormality 03/05/2020  . Raynaud's phenomenon without gangrene 12/25/2019  . Emphysema of lung ([place]) 07/09/2019  . CAD (coronary artery disease) 06/19/2019  . History of craniotomy 06/19/2019  . Morbid obesity ([place]) 06/19/2019  . Unspecified optic neuritis 01/25/2018  . Hypertension 01/10/2018  . Optic neuritis, left 01/09/2018  . Cervical post-laminectomy syndrome 05/09/2016  . Cervical myofascial pain syndrome 05/09/2016  . DDD (degenerative disc disease), cervical 05/09/2016  . Cervical spondylosis without myelopathy 05/09/2016    Onset date: 05/28/20 REFERRING DIAG: cervical myelopathy    THERAPY DIAG:  Unsteadiness on feet  History of falling  Neck pain  Arm weakness  SUBJECTIVE:   PATIENT HISTORY: Patient is a S/P ACDF C3-4 on 05/28/20. Pt still has numbness in thumb, index and middle finger on left. Prior to surgery, she had pain down R side of neck and R arm. She had difficulty turning neck. This was gradual over last year. After surgery, she is feeling better. Pt had C5-7 fusion in 1997. Pt has bil peripheral neuropathy in bil feet.   Neuropathy is worst in  R leg but her L leg buckles when she fell without warning. Pt had brain surgery Nov, 2011. She has been using st. Cane on and off. I have a headache for last 3 days.                                                                                                                                                                                               PAIN:  Are you having pain? Yes VAS scale: 2-5/10 Pain location: R>L side of neck, Headaches (L tempporal region) Pain orientation: Bilateral  PAIN TYPE: aching and dull Pain description: constant and dull  Aggravating  factors: turning head, UE use with ADLs, sleeping Relieving factors: rest, repositioning  PRECAUTIONS: None  WEIGHT BEARING RESTRICTIONS No  FALLS: Has patient fallen in last 6 months? Yes, Number of falls: 6,  LIVING ENVIRONMENT: Lives with: places; lives with: Medium dog Lives in: House/apartment Stairs: Yes; External: 4 steps; Rail on bil going up Has following equipment at home: Single point cane  PLOF: Independent  PATIENT GOALS improve neck pain, improve balance.  OBJECTIVE:   DIAGNOSTIC FINDINGS: Single cross-table lateral intraoperative radiograph of the cervical spine demonstrates cervical spine from the occiput foot 3 C5. Anterior cervical discectomy infusion with instrumentation of C3-4 has been performed. Solid fusion of C4-5 is again noted. Advanced degenerative disc disease at C2-3 is seen. Metallic port overlies the C2-3 intervertebral disc space. Soft tissue retractors seen within the soft tissues anterior and inferior to the hyoid. Straightening of the cervical spine is likely positional in nature. Endotracheal tube in place.   COGNITION: Overall cognitive status: Within functional limits for tasks assessed    SENSATION: Light touch: Deficits L thumb, index, middle finger  CERVICAL AROM/PROM A/PROM A/PROM (deg) 07/22/2020  Flexion 34  Extension 45  Right lateral flexion 18  Left lateral flexion 14  Right rotation 40  Left rotation 45   Upper Extremity AROM/PROM: WNL grossly  UE MMT:  MMT Right 07/22/2020 Left 07/22/2020  Shoulder flexion 4/5 5/5  Shoulder abduction 4/5 5/5  Shoulder extension 5/5 5/5  Elbow flexion 5/5 5/5  Elbow extension 5/5 5/5  Wrist flexion 5/5 5/5  Wrist extension 5/5 5/5  Grip strength 60 lbs 42 lbs     PATIENT SURVEYS:  NDI 34%  TODAY'S TREATMENT:  Movement with mobilization: unilateral PA mobilization with contralateral cervical rotation Central PA mobilization with cervical retraction   PATIENT  EDUCATION: Education details: Patient Person educated: Patient Education method: Customer service manager Education comprehension: verbalized understanding   HOME EXERCISE PROGRAM: Access Code: [email or web address] URL: [email or web address] Date: 07/22/2020 Prepared by:  Markus Jarvis  Exercises Seated Assisted Cervical Rotation with Towel - 3-5 x daily - 7 x weekly - 10 reps   ASSESSMENT:  CLINICAL IMPRESSION: Patient is a 60 y.o. female who was seen today for neck pain s/p ACDF C3-4 on 05/28/20. Objective impairments include decreased balance, decreased ROM, decreased strength, hypomobility, increased fascial restrictions, increased muscle spasms, impaired flexibility, impaired sensation, impaired UE functional use, postural dysfunction and pain. These impairments are limiting patient from cleaning, driving, meal prep, yard work and sleeping. Personal factors including Past/current experiences, Time since onset of injury/illness/exacerbation and 1-2 comorbidities: CAD, COPD, HTN, sleep apnea, acoustic neuroma are also affecting patient's functional outcome. Patient will benefit from skilled PT to address above impairments and improve overall function.  REHAB POTENTIAL: Good  CLINICAL DECISION MAKING: Stable/uncomplicated  EVALUATION COMPLEXITY: Low   GOALS: Goals reviewed with patient? Yes  SHORT TERM GOALS: =  Long term goals  LONG TERM GOALS:   LTG Name Target Date Goal status  1 Patient will demo at least 50 deg of cervical flexion without pain to improve ability to read literature on her lap Comments: Eval 34 deg 09/30/20 INITIAL  2 Patient will report no instances of L leg buckling and falls in 5 consecutive weeks to reduce fall frequency and improve safety awareness. Comments: Had 2 falls since 05/2020 09/30/20 INITIAL  3 Patient will demo 70 deg of cervical rotation bil without pain to improve ability to drive. Comments: 40 deg to R & 45 deg to L (eval) 09/30/20  INITIAL  4 Patient will report <1-2 headaches per week to improve functional endurance. Comments: on eval had headache for 3 days straight 09/30/20 INITIAL  5 Patient will demo improve grip strength in L UE by 5lbs to improve grip strength in L UE with ADLs. Comments: 42lbs L & 60lbs R (eval) 09/30/20 INITIAL   PLAN: PT FREQUENCY: 1x/week  PT DURATION: 10 weeks  PLANNED INTERVENTIONS: Therapeutic exercises, Therapeutic activity, Neuro Muscular re-education, Balance training, Gait training, Patient/Family education, Joint mobilization, Dry Needling, Electrical stimulation, Spinal mobilization, Cryotherapy, Moist heat and Traction  PLAN FOR NEXT SESSION: Review HEP, continue with cervical mobilization, Assess balance   Kerrie Pleasure, PT 07/22/2020, 8:56 AM  [facility] [address] [place], [place], [address] Phone: [phone]   Fax:  [phone]

## 2020-07-27 ENCOUNTER — Other Ambulatory Visit: Payer: Medicare HMO

## 2020-07-27 ENCOUNTER — Ambulatory Visit
Admission: RE | Admit: 2020-07-27 | Discharge: 2020-07-27 | Disposition: A | Discharge status: Home / Self Care | Payer: Medicare HMO | Source: Ambulatory Visit | Attending: Surgery | Admitting: Surgery

## 2020-07-27 ENCOUNTER — Encounter: Payer: Self-pay | Admitting: Genetic Counselor

## 2020-07-27 ENCOUNTER — Other Ambulatory Visit: Payer: Self-pay

## 2020-07-27 DIAGNOSIS — D241 Benign neoplasm of right breast: Secondary | ICD-10-CM | POA: Diagnosis not present

## 2020-07-27 DIAGNOSIS — R9389 Abnormal findings on diagnostic imaging of other specified body structures: Secondary | ICD-10-CM

## 2020-07-27 DIAGNOSIS — N6489 Other specified disorders of breast: Secondary | ICD-10-CM | POA: Diagnosis not present

## 2020-07-27 NOTE — Progress Notes (Signed)
HPI:  Amanda Cordova was previously seen in the [facility] due to a family history of cancer and concerns regarding a hereditary predisposition to cancer. Please refer to our prior cancer genetics clinic note for more information regarding our discussion, assessment and recommendations, at the time. Amanda Cordova's recent genetic test results were disclosed to her, as were recommendations warranted by these results. These results and recommendations are discussed in more detail below.  FAMILY HISTORY:  We obtained a detailed, 4-generation family history.  Significant diagnoses are listed below: Family History  Problem Relation Age of Onset  . Breast cancer Sister   . Diabetes Sister 6       negative genetic test (VUS in CHEK2 c.1420C>T possibly mosaic)  . COPD Mother   . Heart failure Mother   . Heart attack Father   . Diabetes Maternal Aunt   . Breast cancer Sister 22  . Breast cancer Sister        dx 37s  . Bladder Cancer Maternal Uncle        dx >50  . Bladder Cancer Other        dx 52s (mother's first cousin)  . Thyroid cancer Other        dx 47s (mother's first cousin)   Amanda Cordova has one daughter and three sons, all in their 49s. She has one brother (in his 48s) and three sisters (ages 70s-60s). All three sisters have had breast cancer - one at age 25 Amanda Cordova), one at age 45 Amanda Cordova), and one in her 30s Amanda Cordova). 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  Amanda Cordova's mother died at age 81 without cancer. There was one maternal aunt and one maternal uncle. Her uncle was diagnosed with bladder cancer older than 50. There is no known cancer among maternal cousins. Amanda Cordova's maternal grandparents died older than 34 without cancer. She notes a maternal cousin once removed (mother's first cousin) who was diagnosed with bladder cancer and thyroid cancer  in her 32s.  Amanda Cordova's father died at age 59 without cancer. Amanda Cordova does not have any additional information about her paternal relatives, as her father became an orphan at age 58.  Amanda Cordova is aware of previous family history of genetic testing for hereditary cancer risks. Patient's maternal ancestors are of [place] descent, and paternal ancestors are of unknown descent. There is no reported Ashkenazi Jewish ancestry. There is no known consanguinity.  GENETIC TEST RESULTS: Genetic testing reported out on 07/21/2020 through the [facility] Multi-Cancer + RNA panel. No pathogenic variants were detected.   The Multi-Cancer + RNA Panel offered by [facility] includes sequencing and/or deletion/duplication analysis of the following 84 genes:  AIP*, ALK, APC*, ATM*, AXIN2*, BAP1*, BARD1*, BLM*, BMPR1A*, BRCA1*, BRCA2*, BRIP1*, CASR, CDC73*, CDH1*, CDK4, CDKN1B*, CDKN1C*, CDKN2A, CEBPA, CHEK2*, CTNNA1*, DICER1*, DIS3L2*, EGFR, EPCAM, FH*, FLCN*, GATA2*, GPC3, GREM1, HOXB13, HRAS, KIT, MAX*, MEN1*, MET, MITF, MLH1*, MSH2*, MSH3*, MSH6*, MUTYH*, NBN*, NF1*, NF2*, NTHL1*, PALB2*, PDGFRA, PHOX2B, PMS2*, POLD1*, POLE*, POT1*, PRKAR1A*, PTCH1*, PTEN*, RAD50*, RAD51C*, RAD51D*, RB1*, RECQL4, RET, RUNX1*, SDHA*, SDHAF2*, SDHB*, SDHC*, SDHD*, SMAD4*, SMARCA4*, SMARCB1*, SMARCE1*, STK11*, SUFU*, TERC, TERT, TMEM127*, Tp53*, TSC1*, TSC2*, VHL*, WRN*, and WT1.  RNA analysis is performed for * genes. The test report will be scanned into EPIC and located under the Molecular Pathology section of the Results Review tab.  A portion of the result report is  included below for reference.     We discussed with Amanda Cordova that because current genetic testing is not perfect, it is possible there may be a gene mutation in one of these genes that current testing cannot detect, but that chance is small.  We also discussed that there could be another gene that has not yet been discovered, or that we have not yet tested, that is  responsible for the cancer diagnoses in the family. It is also possible there is a hereditary cause for the cancer in the family that Amanda Cordova did not inherit and therefore was not identified in her testing.  Therefore, it is important to remain in touch with cancer genetics in the future so that we can continue to offer Amanda Cordova the most up to date genetic testing.    ADDITIONAL GENETIC TESTING: We discussed with Amanda Cordova that her genetic testing was fairly extensive.  If there are genes identified to increase cancer risk that can be analyzed in the future, we would be happy to discuss and coordinate this testing at that time.    CANCER SCREENING RECOMMENDATIONS: Amanda Cordova's test result is considered negative (normal).  This means that we have not identified a hereditary cause for her family history of cancer at this time. While reassuring, this does not definitively rule out a hereditary predisposition to cancer. It is still possible that there could be genetic mutations that are undetectable by current technology. There could be genetic mutations in genes that have not been tested or identified to increase cancer risk.  Therefore, it is recommended she continue to follow the cancer management and screening guidelines provided by her primary healthcare provider.   An individual's cancer risk and medical management are not determined by genetic test results alone. Overall cancer risk assessment incorporates additional factors, including personal medical history, family history, and any available genetic information that may result in a personalized plan for cancer prevention and surveillance.  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 However, it has been observed that individuals with a history of atypical ductal hyperplasia have an increased  risk to develop breast cancer, up to 30% within 25 years (PMID: 85277824). The ACS recommends consideration of breast MRI screening as an adjunct to mammography for patients at high risk (defined as 20% or greater lifetime risk).  Amanda Cordova may be considered to be at high risk for breast cancer due to her personal history of atypical ductal hyperplasia. Therefore, we recommend that she consider annual breast MRIs in addition to annual mammograms. We discussed that Amanda Cordova should discuss her individual situation with her referring physician and determine a breast cancer screening plan with which they are both comfortable.    RECOMMENDATIONS FOR FAMILY MEMBERS:  Individuals in this family might be at some increased risk of developing cancer, over the general population risk, simply due to the family history of cancer.  We recommended women in this family have a yearly mammogram beginning at age 48, or 69 years younger than the earliest onset of cancer, an annual clinical breast exam, and perform monthly breast self-exams. Women in this family should also have a gynecological exam as recommended by their primary provider. All family members should be referred for colonoscopy starting at age 69.  It is also possible there is a hereditary cause for the cancer in Amanda Cordova's family that  she did not inherit and therefore was not identified in her.  Based on Amanda Cordova's family history, we recommended her two sisters who were diagnosed with breast cancer in their 45s Arbie Cookey and Maudie Mercury) have genetic counseling and testing. Amanda Cordova will let us know if we can be of any assistance in coordinating genetic counseling and/or testing for this family member.   FOLLOW-UP: Lastly, we discussed with Amanda Cordova that cancer genetics is a rapidly advancing field and it is possible that new genetic tests will be appropriate for her and/or her family members in the future. We encouraged her to remain in contact with  cancer genetics on an annual basis so we can update her personal and family histories and let her know of advances in cancer genetics that may benefit this family.   Our contact number was provided. Amanda Cordova's questions were answered to her satisfaction, and she knows she is welcome to call us at anytime with additional questions or concerns.   Clint Guy, MS, [facility] Genetic Counselor [email or web address] Phone: [phone]

## 2020-07-29 ENCOUNTER — Other Ambulatory Visit: Payer: Self-pay

## 2020-07-29 ENCOUNTER — Ambulatory Visit: Payer: Medicare HMO

## 2020-07-29 DIAGNOSIS — M542 Cervicalgia: Secondary | ICD-10-CM

## 2020-07-29 DIAGNOSIS — R2689 Other abnormalities of gait and mobility: Secondary | ICD-10-CM | POA: Diagnosis not present

## 2020-07-29 DIAGNOSIS — M6281 Muscle weakness (generalized): Secondary | ICD-10-CM

## 2020-07-29 DIAGNOSIS — R29898 Other symptoms and signs involving the musculoskeletal system: Secondary | ICD-10-CM | POA: Diagnosis not present

## 2020-07-29 DIAGNOSIS — Z9181 History of falling: Secondary | ICD-10-CM | POA: Diagnosis not present

## 2020-07-29 DIAGNOSIS — R2681 Unsteadiness on feet: Secondary | ICD-10-CM

## 2020-07-29 NOTE — Therapy (Signed)
OUTPATIENT PHYSICAL THERAPY TREATMENT NOTE   Patient Name: Amanda Cordova MRN: [id number] DOB:23-Sep-1960, 60 y.o., female Today's Date: 07/29/2020  PCP: [name], [name], MD REFERRING PROVIDER: Karsten Ro, DO     PT End of Session - 07/29/20 [phone]    Visit Number 2    Number of Visits 10    Date for PT Re-Evaluation 09/30/20    Authorization Type [employer] Medicare    Progress Note Due on Visit 10    PT Start Time 0750    PT Stop Time 0835    PT Time Calculation (min) 45 min    Activity Tolerance Patient tolerated treatment well    Behavior During Therapy [facility] for tasks assessed/performed           Past Medical History:  Diagnosis Date  . Acoustic neuroma ([place])   . CAD (coronary artery disease)   . COPD (chronic obstructive pulmonary disease) ([place])   . Family history of bladder cancer   . Family history of breast cancer   . Family history of thyroid cancer   . Hyperlipemia   . Hypertension   . Neuropathy   . Optic neuritis   . Sleep apnea    wears cpap   Past Surgical History:  Procedure Laterality Date  . ABDOMINAL HYSTERECTOMY    . ANTERIOR CERVICAL DECOMP/DISCECTOMY FUSION N/A 05/28/2020   Procedure: ANTERIOR CERVICAL DECOMPRESSION FUSION CERVICAL THREE-FOUR.;  Surgeon: Karsten Ro, DO;  Location: [place];  Service: Neurosurgery;  Laterality: N/A;  anterior  . BREAST EXCISIONAL BIOPSY Right 2000   benign  . cerical fusion  1995  . CHOLECYSTECTOMY    . 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 HYSTERECTOMY ABDOMINAL WITH SALPINGECTOMY    . MENISCUS REPAIR Left    Patient Active Problem List   Diagnosis Date Noted  . Genetic testing 07/21/2020  . History of optic neuritis 07/13/2020  . Hyperlipidemia 07/13/2020  . Insomnia 07/13/2020  . Obstructive sleep apnea syndrome 07/13/2020  . Osteopenia 07/13/2020  . Plantar nerve lesion 07/13/2020  . Prediabetes 07/13/2020  . Vitamin D deficiency 07/13/2020  . Family history of breast cancer   .  Family history of bladder cancer   . Family history of thyroid cancer   . Cervical myelopathy ([place]) 05/28/2020  . Cerebral vascular disease 04/01/2020  . History of acoustic neuroma 04/01/2020  . Cervical stenosis (uterine cervix) 04/01/2020  . Paresthesia 03/05/2020  . Gait abnormality 03/05/2020  . Raynaud's phenomenon without gangrene 12/25/2019  . Emphysema of lung ([place]) 07/09/2019  . CAD (coronary artery disease) 06/19/2019  . History of craniotomy 06/19/2019  . Morbid obesity ([place]) 06/19/2019  . Unspecified optic neuritis 01/25/2018  . Hypertension 01/10/2018  . Optic neuritis, left 01/09/2018  . Cervical post-laminectomy syndrome 05/09/2016  . Cervical myofascial pain syndrome 05/09/2016  . DDD (degenerative disc disease), cervical 05/09/2016  . Cervical spondylosis without myelopathy 05/09/2016    Onset date: 05/28/20 REFERRING DIAG: cervical myelopathy  PCP: [name], [name], MD REFERRING PROVIDER: Dawley, Theodoro Doing, DO    THERAPY DIAG:  Unsteadiness on feet  Neck pain  Arm weakness  Other abnormalities of gait and mobility  Muscle weakness (generalized)  SUBJECTIVE: Pain in neck is about the same. I haven't had headache since last session.   PAIN:  Are you having pain? Yes VAS scale: 2/10 Pain location: NEck Pain orientation: Bilateral  PAIN TYPE: aching Pain description: constant  Aggravating factors: rotation, UE use Relieving factors: rest, reposition  OBJECTIVE:   CERVICAL AROM/PROM A/PROM A/PROM (deg) 07/22/2020 AROM (07/29/20) Pre session  Flexion 34 34  Extension 45 45  Right lateral flexion 18 NT  Left lateral flexion 14 NT  Right rotation 40 55  Left rotation 45 49       NT = not tested  TODAY'S TREATMENT:  07/22/20: Movement with mobilization: unilateral PA mobilization with contralateral cervical rotation Central PA mobilization with cervical retraction 07/29/20 Movement with mobilization: PA mobs with cervical retraction: central  and unilateral on L and R focused on OA, C1-2 Movement with mobilization: lateral glides: OA on L  Cervical ROM: after manual therapy  Flexion: 42 deg  Extension:  R lateral flexion: 25 deg  L lateral flexion: 25 deg  R rotation:60 deg  L rotation: 55 deg Strap mobilization: PA pressure with cervical retractions: 20x Strap mobilization: unilateral PA with contralateral cervical rotations: 20x R and L Part of the FGA completed    PATIENT EDUCATION: Education details: Patient Person educated: Patient Education method: Customer service manager Education comprehension: verbalized understanding     HOME EXERCISE PROGRAM: Access Code: [email or web address] URL: [email or web address] Date: 07/22/2020 Prepared by: Markus Jarvis   Exercises Seated Assisted Cervical Rotation with Towel - 3-5 x daily - 7 x weekly - 10 reps   On 07/29/20: Verbally reviewed with patient: added strap mobilization with PA and cervical retraction and seated horizontal and vertical head turns (10 -20 reps, 1x/day) for vestibular desensitization.   ASSESSMENT:   CLINICAL IMPRESSION: Pt is reporting decreasing intensity and frequency of headaches. Patient was reporting lower pain levels today compared to before. Patient's cervical rotation was improved compared to last session. Cervical flexion improved within session today significantly. Pt demo significant balance issues with head turns with functional ambulation   REHAB POTENTIAL: Good   CLINICAL DECISION MAKING: Stable/uncomplicated   EVALUATION COMPLEXITY: Low     GOALS: Goals reviewed with patient? Yes   SHORT TERM GOALS: =  Long term goals   LONG TERM GOALS:    LTG Name Target Date Goal status  1 Patient will demo at least 50 deg of cervical flexion without pain to improve ability to read literature on her lap Comments: Eval 34 deg 09/30/20 INITIAL  2 Patient will report no instances of L leg buckling and falls in 5 consecutive weeks  to reduce fall frequency and improve safety awareness. Comments: Had 2 falls since 05/2020 09/30/20 INITIAL  3 Patient will demo 70 deg of cervical rotation bil without pain to improve ability to drive. Comments: 40 deg to R & 45 deg to L (eval) 09/30/20 INITIAL  4 Patient will report <1-2 headaches per week to improve functional endurance. Comments: on eval had headache for 3 days straight 09/30/20 INITIAL  5 Patient will demo improve grip strength in L UE by 5lbs to improve grip strength in L UE with ADLs. Comments: 42lbs L & 60lbs R (eval) 09/30/20 INITIAL    PLAN: PT FREQUENCY: 1x/week   PT DURATION: 10 weeks   PLANNED INTERVENTIONS: Therapeutic exercises, Therapeutic activity, Neuro Muscular re-education, Balance training, Gait training, Patient/Family education, Joint mobilization, Dry Needling, Electrical stimulation, Spinal mobilization, Cryotherapy, Moist heat and Traction   PLAN FOR NEXT SESSION: Review HEP, continue with cervical mobilization, Finish FGA     Kerrie Pleasure, PT 07/29/2020, 8:43 AM    [facility] [address] [place], [place], [address] Phone: [phone]   Fax:  [phone]  Patient name: Amanda Cordova MRN:  [id number] DOB: 12/06/1960

## 2020-07-30 ENCOUNTER — Other Ambulatory Visit: Payer: Self-pay | Admitting: Surgery

## 2020-07-30 DIAGNOSIS — N6342 Unspecified lump in left breast, subareolar: Secondary | ICD-10-CM | POA: Diagnosis not present

## 2020-07-30 DIAGNOSIS — R9389 Abnormal findings on diagnostic imaging of other specified body structures: Secondary | ICD-10-CM

## 2020-07-30 DIAGNOSIS — N6321 Unspecified lump in the left breast, upper outer quadrant: Secondary | ICD-10-CM | POA: Diagnosis not present

## 2020-08-04 ENCOUNTER — Other Ambulatory Visit: Payer: Self-pay | Admitting: Surgery

## 2020-08-04 DIAGNOSIS — R9389 Abnormal findings on diagnostic imaging of other specified body structures: Secondary | ICD-10-CM

## 2020-08-05 ENCOUNTER — Other Ambulatory Visit: Payer: Self-pay

## 2020-08-05 ENCOUNTER — Ambulatory Visit: Payer: Medicare HMO

## 2020-08-05 DIAGNOSIS — R2689 Other abnormalities of gait and mobility: Secondary | ICD-10-CM

## 2020-08-05 DIAGNOSIS — Z9181 History of falling: Secondary | ICD-10-CM | POA: Diagnosis not present

## 2020-08-05 DIAGNOSIS — R29898 Other symptoms and signs involving the musculoskeletal system: Secondary | ICD-10-CM

## 2020-08-05 DIAGNOSIS — R2681 Unsteadiness on feet: Secondary | ICD-10-CM | POA: Diagnosis not present

## 2020-08-05 DIAGNOSIS — M542 Cervicalgia: Secondary | ICD-10-CM

## 2020-08-05 DIAGNOSIS — M6281 Muscle weakness (generalized): Secondary | ICD-10-CM | POA: Diagnosis not present

## 2020-08-05 NOTE — Therapy (Signed)
OUTPATIENT PHYSICAL THERAPY TREATMENT NOTE   Patient Name: Amanda Cordova MRN: [id number] DOB:Sep 05, 1960, 60 y.o., female Today's Date: 08/05/2020  PCP: [name], [name], MD REFERRING PROVIDER: Karsten Ro, DO     PT End of Session - 08/05/20 [phone]    Visit Number 3    Number of Visits 10    Date for PT Re-Evaluation 09/30/20    Authorization Type [employer]    Progress Note Due on Visit 10    PT Start Time 0850    PT Stop Time 0930    PT Time Calculation (min) 40 min    Activity Tolerance Patient tolerated treatment well    Behavior During Therapy [facility] for tasks assessed/performed           Past Medical History:  Diagnosis Date  . Acoustic neuroma ([place])   . CAD (coronary artery disease)   . COPD (chronic obstructive pulmonary disease) ([place])   . Family history of bladder cancer   . Family history of breast cancer   . Family history of thyroid cancer   . Hyperlipemia   . Hypertension   . Neuropathy   . Optic neuritis   . Sleep apnea    wears cpap   Past Surgical History:  Procedure Laterality Date  . ABDOMINAL HYSTERECTOMY    . ANTERIOR CERVICAL DECOMP/DISCECTOMY FUSION N/A 05/28/2020   Procedure: ANTERIOR CERVICAL DECOMPRESSION FUSION CERVICAL THREE-FOUR.;  Surgeon: Karsten Ro, DO;  Location: [place];  Service: Neurosurgery;  Laterality: N/A;  anterior  . BREAST EXCISIONAL BIOPSY Right 2000   benign  . cerical fusion  1995  . CHOLECYSTECTOMY    . 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 HYSTERECTOMY ABDOMINAL WITH SALPINGECTOMY    . MENISCUS REPAIR Left    Patient Active Problem List   Diagnosis Date Noted  . Genetic testing 07/21/2020  . History of optic neuritis 07/13/2020  . Hyperlipidemia 07/13/2020  . Insomnia 07/13/2020  . Obstructive sleep apnea syndrome 07/13/2020  . Osteopenia 07/13/2020  . Plantar nerve lesion 07/13/2020  . Prediabetes 07/13/2020  . Vitamin D deficiency 07/13/2020  . Family history of breast cancer   .  Family history of bladder cancer   . Family history of thyroid cancer   . Cervical myelopathy ([place]) 05/28/2020  . Cerebral vascular disease 04/01/2020  . History of acoustic neuroma 04/01/2020  . Cervical stenosis (uterine cervix) 04/01/2020  . Paresthesia 03/05/2020  . Gait abnormality 03/05/2020  . Raynaud's phenomenon without gangrene 12/25/2019  . Emphysema of lung ([place]) 07/09/2019  . CAD (coronary artery disease) 06/19/2019  . History of craniotomy 06/19/2019  . Morbid obesity ([place]) 06/19/2019  . Unspecified optic neuritis 01/25/2018  . Hypertension 01/10/2018  . Optic neuritis, left 01/09/2018  . Cervical post-laminectomy syndrome 05/09/2016  . Cervical myofascial pain syndrome 05/09/2016  . DDD (degenerative disc disease), cervical 05/09/2016  . Cervical spondylosis without myelopathy 05/09/2016    Onset date: 05/28/20 REFERRING DIAG: cervical myelopathy  PCP: [name], [name], MD REFERRING PROVIDER: Dawley, Theodoro Doing, DO    THERAPY DIAG:  Neck pain  Unsteadiness on feet  Arm weakness  Other abnormalities of gait and mobility  SUBJECTIVE: Pain in neck is about the same. I haven't had headache since last session. It hurts at base of the neck.  PAIN:  Are you having pain? Yes VAS scale: 3/10 Pain location: NEck Pain orientation: Bilateral  PAIN TYPE: aching Pain description: constant  Aggravating factors: rotation, UE use Relieving factors: rest,  reposition    OBJECTIVE:   CERVICAL AROM/PROM A/PROM A/PROM (deg) 07/22/2020 AROM (07/29/20) Pre session  Flexion 34 34  Extension 45 45  Right lateral flexion 18 NT  Left lateral flexion 14 NT  Right rotation 40 55  Left rotation 45 49       NT = not tested  TODAY'S TREATMENT:  08/05/20:   Movement with mobilization: unilateral PA mobilization with contralateral cervical rotation Central PA mobilization with cervical retraction STM and myofascial release to bil upper trap, levator scapulae R sidelying: L  scapulothoracic upglide, downglides, protraction and retraction, passively with scapular depression hold to stretch soft tissue L rib 1 PA mobilization unilateral, grade IV  07/22/20: Movement with mobilization: unilateral PA mobilization with contralateral cervical rotation Central PA mobilization with cervical retraction 07/29/20 Movement with mobilization: PA mobs with cervical retraction: central and unilateral on L and R focused on OA, C1-2 Movement with mobilization: lateral glides: OA on L  Cervical ROM: after manual therapy  Flexion: 42 deg  Extension:  R lateral flexion: 25 deg  L lateral flexion: 25 deg  R rotation:60 deg  L rotation: 55 deg Strap mobilization: PA pressure with cervical retractions: 20x Strap mobilization: unilateral PA with contralateral cervical rotations: 20x R and L Part of the FGA completed    PATIENT EDUCATION: Education details: Patient Person educated: Patient Education method: Customer service manager Education comprehension: verbalized understanding     HOME EXERCISE PROGRAM: Access Code: [email or web address] URL: [email or web address] Date: 07/22/2020 Prepared by: Markus Jarvis   Exercises Seated Assisted Cervical Rotation with Towel - 3-5 x daily - 7 x weekly - 10 reps   On 07/29/20: Verbally reviewed with patient: added strap mobilization with PA and cervical retraction and seated horizontal and vertical head turns (10 -20 reps, 1x/day) for vestibular desensitization.   ASSESSMENT:   CLINICAL IMPRESSION: Pt had excessive myofascial restrctions in upper thoracic spine L>R. Soft tissue restrictions improved. ROM in cervical spine improved with less restrictions reported by patient towards end range. Cervical flexion seems to be most restricting. Patient has not reported headaches for 2-3 weeks.   REHAB POTENTIAL: Good   CLINICAL DECISION MAKING: Stable/uncomplicated   EVALUATION COMPLEXITY: Low     GOALS: Goals reviewed  with patient? Yes   SHORT TERM GOALS: =  Long term goals   LONG TERM GOALS:    LTG Name Target Date Goal status  1 Patient will demo at least 50 deg of cervical flexion without pain to improve ability to read literature on her lap Comments: Eval 34 deg 09/30/20 INITIAL  2 Patient will report no instances of L leg buckling and falls in 5 consecutive weeks to reduce fall frequency and improve safety awareness. Comments: Had 2 falls since 05/2020 09/30/20 INITIAL  3 Patient will demo 70 deg of cervical rotation bil without pain to improve ability to drive. Comments: 40 deg to R & 45 deg to L (eval) 09/30/20 INITIAL  4 Patient will report <1-2 headaches per week to improve functional endurance. Comments: on eval had headache for 3 days straight 09/30/20 INITIAL  5 Patient will demo improve grip strength in L UE by 5lbs to improve grip strength in L UE with ADLs. Comments: 42lbs L & 60lbs R (eval) 09/30/20 INITIAL    PLAN: PT FREQUENCY: 1x/week   PT DURATION: 10 weeks   PLANNED INTERVENTIONS: Therapeutic exercises, Therapeutic activity, Neuro Muscular re-education, Balance training, Gait training, Patient/Family education, Joint mobilization, Dry Needling, Electrical stimulation, Spinal mobilization, Cryotherapy, Moist heat and  Traction   PLAN FOR NEXT SESSION: Review HEP, continue with cervical mobilization, Finish FGA     Kerrie Pleasure, PT 08/05/2020, 8:53 AM    [facility] [address] [place], [place], [address] Phone: [phone]   Fax:  [phone]  Patient name: Serria Sloma MRN: [id number] DOB: December 04, 1960

## 2020-08-12 ENCOUNTER — Other Ambulatory Visit: Payer: Self-pay

## 2020-08-12 ENCOUNTER — Ambulatory Visit: Payer: Medicare HMO

## 2020-08-12 DIAGNOSIS — M6281 Muscle weakness (generalized): Secondary | ICD-10-CM

## 2020-08-12 DIAGNOSIS — M542 Cervicalgia: Secondary | ICD-10-CM

## 2020-08-12 DIAGNOSIS — Z9181 History of falling: Secondary | ICD-10-CM | POA: Diagnosis not present

## 2020-08-12 DIAGNOSIS — R29898 Other symptoms and signs involving the musculoskeletal system: Secondary | ICD-10-CM

## 2020-08-12 DIAGNOSIS — R2681 Unsteadiness on feet: Secondary | ICD-10-CM

## 2020-08-12 DIAGNOSIS — R2689 Other abnormalities of gait and mobility: Secondary | ICD-10-CM | POA: Diagnosis not present

## 2020-08-12 NOTE — Therapy (Signed)
OUTPATIENT PHYSICAL THERAPY TREATMENT NOTE   Patient Name: Amanda Cordova MRN: [id number] DOB:01-Aug-1960, 60 y.o., female Today's Date: 08/12/2020  PCP: [name], [name], MD REFERRING PROVIDER: Karsten Ro, DO     PT End of Session - 08/12/20 0805    Visit Number 4    Number of Visits 10    Date for PT Re-Evaluation 09/30/20    Authorization Type [employer]    Progress Note Due on Visit 10    PT Start Time 0800    PT Stop Time 0845    PT Time Calculation (min) 45 min    Activity Tolerance Patient tolerated treatment well    Behavior During Therapy [facility] for tasks assessed/performed           Past Medical History:  Diagnosis Date  . Acoustic neuroma ([place])   . CAD (coronary artery disease)   . COPD (chronic obstructive pulmonary disease) ([place])   . Family history of bladder cancer   . Family history of breast cancer   . Family history of thyroid cancer   . Hyperlipemia   . Hypertension   . Neuropathy   . Optic neuritis   . Sleep apnea    wears cpap   Past Surgical History:  Procedure Laterality Date  . ABDOMINAL HYSTERECTOMY    . ANTERIOR CERVICAL DECOMP/DISCECTOMY FUSION N/A 05/28/2020   Procedure: ANTERIOR CERVICAL DECOMPRESSION FUSION CERVICAL THREE-FOUR.;  Surgeon: Karsten Ro, DO;  Location: [place];  Service: Neurosurgery;  Laterality: N/A;  anterior  . BREAST EXCISIONAL BIOPSY Right 2000   benign  . cerical fusion  1995  . CHOLECYSTECTOMY    . 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 HYSTERECTOMY ABDOMINAL WITH SALPINGECTOMY    . MENISCUS REPAIR Left    Patient Active Problem List   Diagnosis Date Noted  . Genetic testing 07/21/2020  . History of optic neuritis 07/13/2020  . Hyperlipidemia 07/13/2020  . Insomnia 07/13/2020  . Obstructive sleep apnea syndrome 07/13/2020  . Osteopenia 07/13/2020  . Plantar nerve lesion 07/13/2020  . Prediabetes 07/13/2020  . Vitamin D deficiency 07/13/2020  . Family history of breast cancer   .  Family history of bladder cancer   . Family history of thyroid cancer   . Cervical myelopathy ([place]) 05/28/2020  . Cerebral vascular disease 04/01/2020  . History of acoustic neuroma 04/01/2020  . Cervical stenosis (uterine cervix) 04/01/2020  . Paresthesia 03/05/2020  . Gait abnormality 03/05/2020  . Raynaud's phenomenon without gangrene 12/25/2019  . Emphysema of lung ([place]) 07/09/2019  . CAD (coronary artery disease) 06/19/2019  . History of craniotomy 06/19/2019  . Morbid obesity ([place]) 06/19/2019  . Unspecified optic neuritis 01/25/2018  . Hypertension 01/10/2018  . Optic neuritis, left 01/09/2018  . Cervical post-laminectomy syndrome 05/09/2016  . Cervical myofascial pain syndrome 05/09/2016  . DDD (degenerative disc disease), cervical 05/09/2016  . Cervical spondylosis without myelopathy 05/09/2016    Onset date: 05/28/20 REFERRING DIAG: cervical myelopathy  PCP: [name], [name], MD REFERRING PROVIDER: Dawley, Theodoro Doing, DO    THERAPY DIAG:  Neck pain  Unsteadiness on feet  Arm weakness  Muscle weakness (generalized)  SUBJECTIVE: Pt reports sore for 2-3 days after last time. No headaches sine first session. Feels sore in top left of neck at base of head.  PAIN:  Are you having pain? Yes VAS scale: 3/10 Pain location: NEck Pain orientation: Bilateral  PAIN TYPE: aching Pain description: constant  Aggravating factors: rotation, UE use Relieving factors:  rest, reposition    OBJECTIVE:   CERVICAL AROM/PROM A/PROM A/PROM (deg) 07/22/2020 AROM (07/29/20) Pre session AROM 08/12/20  Flexion 34 34 55  Extension 45 45 52  Right lateral flexion 18 NT 25  Left lateral flexion 14 NT 20  Right rotation 40 55 55  Left rotation 45 49 55       NT = not tested  TODAY'S TREATMENT: 08/12/20: Movement with mobilization: unilateral PA mobilization with contralateral cervical rotation Central PA mobilization with cervical retraction- unilateral on L at C1-2 STM and  myofascial release to bil upper trap, levator scapulae R sidelying: L scapulothoracic upglide, downglides, protraction and retraction, passively with scapular depression hold to stretch soft tissue L rib 1 PA mobilization unilateral, grade IV Cervical retraction with overpressure at Chin by patient and cervical flexion with overpressure at top of head: 2 x 10 Myofascial release to anterior neck musculature wih    08/05/20:   Movement with mobilization: unilateral PA mobilization with contralateral cervical rotation Central PA mobilization with cervical retraction STM and myofascial release to bil upper trap, levator scapulae R sidelying: L scapulothoracic upglide, downglides, protraction and retraction, passively with scapular depression hold to stretch soft tissue L rib 1 PA mobilization unilateral, grade IV  07/22/20: Movement with mobilization: unilateral PA mobilization with contralateral cervical rotation Central PA mobilization with cervical retraction 07/29/20 Movement with mobilization: PA mobs with cervical retraction: central and unilateral on L and R focused on OA, C1-2 Movement with mobilization: lateral glides: OA on L  Cervical ROM: after manual therapy  Flexion: 42 deg  Extension:  R lateral flexion: 25 deg  L lateral flexion: 25 deg  R rotation:60 deg  L rotation: 55 deg Strap mobilization: PA pressure with cervical retractions: 20x Strap mobilization: unilateral PA with contralateral cervical rotations: 20x R and L Part of the FGA completed    PATIENT EDUCATION: Education details: Patient Person educated: Patient Education method: Customer service manager Education comprehension: verbalized understanding     HOME EXERCISE PROGRAM: Access Code: [email or web address] URL: [email or web address] Date: 07/22/2020 Prepared by: Markus Jarvis   Exercises Seated Assisted Cervical Rotation with Towel - 3-5 x daily - 7 x weekly - 10 reps   On 07/29/20: Verbally  reviewed with patient: added strap mobilization with PA and cervical retraction and seated horizontal and vertical head turns (10 -20 reps, 1x/day) for vestibular desensitization.   ASSESSMENT:   CLINICAL IMPRESSION: Pt is demonstrating significant improvement in her cervical ROM compared to first and last reassessment. Pt is compliant with HEP. Any lateral flexion in cervical spine, pt instantly feels numbness going down to her hands.   REHAB POTENTIAL: Good   CLINICAL DECISION MAKING: Stable/uncomplicated   EVALUATION COMPLEXITY: Low     GOALS: Goals reviewed with patient? Yes   SHORT TERM GOALS: =  Long term goals   LONG TERM GOALS:    LTG Name Target Date Goal status  1 Patient will demo at least 50 deg of cervical flexion without pain to improve ability to read literature on her lap Comments: Eval 34 deg 09/30/20 INITIAL  2 Patient will report no instances of L leg buckling and falls in 5 consecutive weeks to reduce fall frequency and improve safety awareness. Comments: Had 2 falls since 05/2020 09/30/20 INITIAL  3 Patient will demo 70 deg of cervical rotation bil without pain to improve ability to drive. Comments: 40 deg to R & 45 deg to L (eval) 09/30/20 INITIAL  4 Patient will report <1-2 headaches per week  to improve functional endurance. Comments: on eval had headache for 3 days straight; no headache since first visit (08/12/20) 09/30/20 Goal met  5 Patient will demo improve grip strength in L UE by 5lbs to improve grip strength in L UE with ADLs. Comments: 42lbs L & 60lbs R (eval) 09/30/20 INITIAL    PLAN: PT FREQUENCY: 1x/week   PT DURATION: 10 weeks   PLANNED INTERVENTIONS: Therapeutic exercises, Therapeutic activity, Neuro Muscular re-education, Balance training, Gait training, Patient/Family education, Joint mobilization, Dry Needling, Electrical stimulation, Spinal mobilization, Cryotherapy, Moist heat and Traction   PLAN FOR NEXT SESSION: Review HEP, continue with  cervical mobilization, Finish FGA     Kerrie Pleasure, PT 08/12/2020, 8:06 AM    [facility] [address] [place], [place], [address] Phone: [phone]   Fax:  [phone]  Patient name: Amanda Cordova MRN: [id number] DOB: 07-17-1960

## 2020-08-13 ENCOUNTER — Other Ambulatory Visit: Payer: Medicare HMO

## 2020-08-13 ENCOUNTER — Ambulatory Visit
Admission: RE | Admit: 2020-08-13 | Discharge: 2020-08-13 | Disposition: A | Discharge status: Home / Self Care | Payer: Medicare HMO | Source: Ambulatory Visit | Attending: Surgery | Admitting: Surgery

## 2020-08-13 ENCOUNTER — Other Ambulatory Visit (HOSPITAL_COMMUNITY): Payer: Self-pay | Admitting: Diagnostic Radiology

## 2020-08-13 DIAGNOSIS — R9389 Abnormal findings on diagnostic imaging of other specified body structures: Secondary | ICD-10-CM

## 2020-08-13 DIAGNOSIS — N6022 Fibroadenosis of left breast: Secondary | ICD-10-CM | POA: Diagnosis not present

## 2020-08-13 DIAGNOSIS — N6342 Unspecified lump in left breast, subareolar: Secondary | ICD-10-CM | POA: Diagnosis not present

## 2020-08-13 DIAGNOSIS — N6012 Diffuse cystic mastopathy of left breast: Secondary | ICD-10-CM | POA: Diagnosis not present

## 2020-08-13 DIAGNOSIS — N6321 Unspecified lump in the left breast, upper outer quadrant: Secondary | ICD-10-CM | POA: Diagnosis not present

## 2020-08-13 MED ORDER — GADOBUTROL 1 MMOL/ML IV SOLN
10.0000 mL | Freq: Once | INTRAVENOUS | Status: AC | PRN
  Administered 2020-08-13: 10 mL via INTRAVENOUS

## 2020-08-14 ENCOUNTER — Other Ambulatory Visit (HOSPITAL_COMMUNITY): Payer: Self-pay | Admitting: Diagnostic Radiology

## 2020-08-14 ENCOUNTER — Other Ambulatory Visit: Payer: Medicare HMO

## 2020-08-14 ENCOUNTER — Ambulatory Visit
Admission: RE | Admit: 2020-08-14 | Discharge: 2020-08-14 | Disposition: A | Discharge status: Home / Self Care | Payer: Medicare HMO | Source: Ambulatory Visit | Attending: Surgery | Admitting: Surgery

## 2020-08-14 ENCOUNTER — Other Ambulatory Visit: Payer: Self-pay | Admitting: Surgery

## 2020-08-14 ENCOUNTER — Other Ambulatory Visit: Payer: Self-pay

## 2020-08-14 DIAGNOSIS — R9389 Abnormal findings on diagnostic imaging of other specified body structures: Secondary | ICD-10-CM

## 2020-08-14 DIAGNOSIS — O0289 Other abnormal products of conception: Secondary | ICD-10-CM

## 2020-08-14 DIAGNOSIS — R921 Mammographic calcification found on diagnostic imaging of breast: Secondary | ICD-10-CM | POA: Diagnosis not present

## 2020-08-14 DIAGNOSIS — N6011 Diffuse cystic mastopathy of right breast: Secondary | ICD-10-CM | POA: Diagnosis not present

## 2020-08-18 ENCOUNTER — Other Ambulatory Visit: Payer: Self-pay | Admitting: Surgery

## 2020-08-18 DIAGNOSIS — R9389 Abnormal findings on diagnostic imaging of other specified body structures: Secondary | ICD-10-CM

## 2020-08-19 ENCOUNTER — Ambulatory Visit: Payer: Medicare HMO

## 2020-08-20 ENCOUNTER — Ambulatory Visit (INDEPENDENT_AMBULATORY_CARE_PROVIDER_SITE_OTHER): Payer: Medicare HMO | Admitting: Family Medicine

## 2020-08-20 ENCOUNTER — Other Ambulatory Visit: Payer: Self-pay

## 2020-08-20 ENCOUNTER — Encounter: Payer: Self-pay | Admitting: Family Medicine

## 2020-08-20 VITALS — BP 130/80 | HR 80 | Resp 16 | Ht 64.0 in | Wt 245.0 lb

## 2020-08-20 DIAGNOSIS — G629 Polyneuropathy, unspecified: Secondary | ICD-10-CM | POA: Diagnosis not present

## 2020-08-20 DIAGNOSIS — E559 Vitamin D deficiency, unspecified: Secondary | ICD-10-CM | POA: Diagnosis not present

## 2020-08-20 DIAGNOSIS — I10 Essential (primary) hypertension: Secondary | ICD-10-CM | POA: Diagnosis not present

## 2020-08-20 DIAGNOSIS — R1084 Generalized abdominal pain: Secondary | ICD-10-CM | POA: Diagnosis not present

## 2020-08-20 LAB — BASIC METABOLIC PANEL
BUN: 14 mg/dL (ref 6–23)
CO2: 28 mEq/L (ref 19–32)
Calcium: 9.6 mg/dL (ref 8.4–10.5)
Chloride: 107 mEq/L (ref 96–112)
Creatinine, Ser: 0.7 mg/dL (ref 0.40–1.20)
GFR: 93.21 mL/min (ref >60.00)
Glucose, Bld: 122 mg/dL — ABNORMAL HIGH (ref 70–99)
Potassium: 4.2 mEq/L (ref 3.5–5.1)
Sodium: 144 mEq/L (ref 135–145)

## 2020-08-20 LAB — CBC
HCT: 43.8 % (ref 36.0–46.0)
Hemoglobin: 14.9 g/dL (ref 12.0–15.0)
MCHC: 33.9 g/dL (ref 30.0–36.0)
MCV: 90.3 fl (ref 78.0–100.0)
Platelets: 291 10*3/uL (ref 150.0–400.0)
RBC: 4.85 Mil/uL (ref 3.87–5.11)
RDW: 14 % (ref 11.5–15.5)
WBC: 8.4 10*3/uL (ref 4.0–10.5)

## 2020-08-20 LAB — TSH: TSH: 2.66 u[IU]/mL (ref 0.35–4.50)

## 2020-08-20 LAB — VITAMIN D 25 HYDROXY (VIT D DEFICIENCY, FRACTURES): VITD: 41.48 ng/mL (ref 30.00–100.00)

## 2020-08-20 NOTE — Progress Notes (Signed)
HPI: [name] is a 60 y.o. female, who is here today for follow up.   She was last seen on 03/25/20. Since her last visit she has followed with sleep clinic. She had cervical spine surgery on 05/28/20. She is doing PT. Dx;ed with peripheral neuropathy, podiatrist did EMG.  Also since her last visit she underwent left breast Bx and she is supposed to go go back for a 2nd Bx.Reluctant to do so because hematoma on bx place and according to pt, mammogram result and indication for Bx were not explained.  She has been on Alprazolam 0.25 mg daily prn since 2011, helps with balance.   No hx of anxiety. She is not taking Duloxetine 60 mg , prescribed by neuro. She did not tolerate well.  Vit D deficiency: She was on Ergocalciferol 50,000 U. Last taken 2-3 months ago. Last 25 OH vit 03/05/20 was 47.7.  HTN: She is on Amlodipine 5 mg daily. 130's/7-80's. Negative for severe/frequent headache, visual changes, chest pain, dyspnea, palpitation, focal weakness, or edema.  Lab Results  Component Value Date   CREATININE 0.86 05/28/2020   BUN 10 05/25/2020   NA 143 05/25/2020   K 3.8 05/25/2020   CL 106 05/25/2020   CO2 27 05/25/2020   Since started Pravastatin and Amlodipine, she has noted wt gain. She is eating < 1200 kcal/day. Drinks water and coffee. She is cooking at home. She ate pizza, which is not common.  She has noted "packets of fat" around joints. She is exercising ,walking 1-2 hours, walking her dog a few times per days.  2 days of generalized abdominal pain. Nauseas x 1 day. Pain "moves" to different areas of abdomen when lying down. She has not identified exacerbating or alleviating factors. Denies hx of abdominal pain.  Negative for fever,chills,changes in appetite, or skin rash.  No sick contact or recent travel. Loose stools after a couple of days of constipation. No blood in stool and not vomiting. Last bowel movement yesterday.  Slowly  getting better. Last colonoscopy on 04/18/16.  Lab Results  Component Value Date   ALT 13 04/28/2020   AST 23 04/28/2020   ALKPHOS 114 04/28/2020   BILITOT 0.6 04/28/2020   Review of Systems  Constitutional: Negative for activity change and fatigue.  HENT: Negative for mouth sores, nosebleeds, sore throat and trouble swallowing.   Respiratory: Negative for cough and wheezing.   Endocrine: Negative for cold intolerance and heat intolerance.  Genitourinary: Negative for decreased urine volume, dysuria and hematuria.  Neurological: Negative for syncope and facial asymmetry.  Hematological: Negative for adenopathy. Does not bruise/bleed easily.  Psychiatric/Behavioral: Negative for confusion.  Rest of ROS, see pertinent positives sand negatives in HPI  Current Outpatient Medications on File Prior to Visit  Medication Sig Dispense Refill  . albuterol (VENTOLIN HFA) 108 (90 Base) MCG/ACT inhaler INHALE 2 PUFFS INTO THE LUNGS EVERY 6 HOURS AS NEEDED FOR WHEEZING OR SHORTNESS OF BREATH (Patient taking differently: Inhale 2 puffs into the lungs every 6 (six) hours as needed for wheezing or shortness of breath.) 54 g 1  . ALPRAZolam (XANAX) 0.25 MG tablet Take 1 tablet (0.25 mg total) by mouth daily as needed for anxiety. 30 tablet 2  . amLODipine (NORVASC) 5 MG tablet TAKE 1 TABLET(5 MG) BY MOUTH DAILY 90 tablet 2  . aspirin 81 MG chewable tablet     . aspirin EC 81 MG tablet Take 1 tablet (81 mg total) by mouth daily. RESTART in 7  days 30 tablet 11  . clobetasol cream (TEMOVATE) 0.86 % 1 application    . clotrimazole-betamethasone (LOTRISONE) cream 1 application to affected area    . Fluticasone-Salmeterol (ADVAIR DISKUS) 250-50 MCG/DOSE AEPB Inhale 1 puff into the lungs 2 (two) times daily. 1 each 3  . Multiple Vitamins-Minerals (CENTRUM SILVER PO) Take 1 tablet by mouth daily.    . pravastatin (PRAVACHOL) 10 MG tablet TAKE 1 TABLET(10 MG) BY MOUTH DAILY 90 tablet 1  . Respiratory Therapy  Supplies (CARETOUCH 2 CPAP HOSE HANGER) MISC     . ezetimibe (ZETIA) 10 MG tablet Take 1 tablet (10 mg total) by mouth daily. 90 tablet 3   No current facility-administered medications on file prior to visit.   Past Medical History:  Diagnosis Date  . Acoustic neuroma ([place])   . CAD (coronary artery disease)   . COPD (chronic obstructive pulmonary disease) ([place])   . Family history of bladder cancer   . Family history of breast cancer   . Family history of thyroid cancer   . Hyperlipemia   . Hypertension   . Neuropathy   . Optic neuritis   . Sleep apnea    wears cpap   Allergies  Allergen Reactions  . Gabapentin Anaphylaxis and Swelling    Throat and tongue swells Throat and tongue swells Other reaction(s): eyes swelled, throat, hands,  . Tramadol Swelling    Throat and hands  . Crestor [Rosuvastatin] Other (See Comments)  . Duloxetine Nausea And Vomiting  . Tramadol Hcl     Other reaction(s): nausea/dizzy/blurry vision/hands tingling  . Lisinopril Nausea Only    Social History   Socioeconomic History  . Marital status: Married    Spouse name: Not on file  . Number of children: 4  . Years of education: 49  . Highest education level: High school graduate  Occupational History  . Occupation: Disabled  Tobacco Use  . Smoking status: Former Smoker    Packs/day: 1.00    Years: 35.00    Pack years: 35.00    Types: Cigarettes    Quit date: 01/09/2018    Years since quitting: 2.6  . Smokeless tobacco: Never Used  Substance and Sexual Activity  . Alcohol use: Not Currently  . Drug use: Never  . Sexual activity: Not on file  Other Topics Concern  . Not on file  Social History Narrative   Lives at home with her husband and her great-grandson.   Right-handed.   2-3 cups caffeine per day.    Social Determinants of Health   Financial Resource Strain: Low Risk   . Difficulty of Paying Living Expenses: Not hard at all  Food Insecurity: No Food Insecurity  . Worried  About Charity fundraiser in the Last Year: Never true  . Ran Out of Food in the Last Year: Never true  Transportation Needs: No Transportation Needs  . Lack of Transportation (Medical): No  . Lack of Transportation (Non-Medical): No  Physical Activity: Sufficiently Active  . Days of Exercise per Week: 7 days  . Minutes of Exercise per Session: 30 min  Stress: No Stress Concern Present  . Feeling of Stress : Not at all  Social Connections: Moderately Isolated  . Frequency of Communication with Friends and Family: More than three times a week  . Frequency of Social Gatherings with Friends and Family: Once a week  . Attends Religious Services: Never  . Active Member of Clubs or Organizations: No  . Attends Club or  Organization Meetings: Never  . Marital Status: Married   Vitals:   08/20/20 0745  BP: 130/80  Pulse: 80  Resp: 16  SpO2: 97%   Body mass index is 42.05 kg/m.  Physical Exam Vitals and nursing note reviewed.  Constitutional:      General: She is not in acute distress.    Appearance: She is well-developed.  HENT:     Head: Normocephalic and atraumatic.     Mouth/Throat:     Mouth: Mucous membranes are dry.     Pharynx: Oropharynx is clear.  Eyes:     Conjunctiva/sclera: Conjunctivae normal.     Pupils: Pupils are equal, round, and reactive to light.  Cardiovascular:     Rate and Rhythm: Normal rate and regular rhythm.     Pulses:          Dorsalis pedis pulses are 2+ on the right side and 2+ on the left side.     Heart sounds: No murmur heard.   Pulmonary:     Effort: Pulmonary effort is normal. No respiratory distress.     Breath sounds: Normal breath sounds.  Abdominal:     Palpations: Abdomen is soft. There is no hepatomegaly or mass.     Tenderness: There is generalized abdominal tenderness.  Lymphadenopathy:     Cervical: No cervical adenopathy.  Skin:    General: Skin is warm.     Findings: No erythema or rash.  Neurological:     Mental Status:  She is alert and oriented to person, place, and time.     Cranial Nerves: No cranial nerve deficit.     Comments: Antalgic gait assisted by a cane.  Psychiatric:     Comments: Well groomed, good eye contact.   ASSESSMENT AND PLAN:  Amanda Cordova was seen today for follow-up.  Orders Placed This Encounter  Procedures  . VITAMIN D 25 Hydroxy (Vit-D Deficiency, Fractures)  . Basic metabolic panel  . CBC  . TSH   Lab Results  Component Value Date   TSH 2.66 08/20/2020   Lab Results  Component Value Date   WBC 8.4 08/20/2020   HGB 14.9 08/20/2020   HCT 43.8 08/20/2020   MCV 90.3 08/20/2020   PLT 291.0 08/20/2020   Lab Results  Component Value Date   TSH 2.66 08/20/2020   Lab Results  Component Value Date   CREATININE 0.70 08/20/2020   BUN 14 08/20/2020   NA 144 08/20/2020   K 4.2 08/20/2020   CL 107 08/20/2020   CO2 28 08/20/2020   Abdominal pain, generalized Acute, new problem. Improving. We discussed possible causes. Hx and examination today do not suggest a serious process. I do not think imaging is needed at this time. Clearly instructed about warning signs. Further recommendations will be given according to lab results.  Peripheral polyneuropathy Stable. Appropriate foot care. She has not tolerated Gabapentin and Duloxetine in the past. For now she is not interested in pharmacologic treatment.  Vitamin D deficiency OTC Vit D 1000 U daily. Further recommendations will be given according to 25 OH vit D results.  Morbid obesity ([place]) She understands benefits of wt loss as well as adverse effects of obesity. We discussed natural hx of obesity. Wt gained is nit a common side effect of Pravastatin nor Amlodipine. We could change to different medications.She has not tolerated well statins in the past and hx of CAD. Last visit her wt was 246 Lb. There are apps that are helpful  for calorie count and food log. Weight Watchers is also a good  option. Consistency with healthy diet and physical activity recommended.  Primary hypertension BP adequately controlled. She decides to continue Amlodipine 5 mg daily for now, we discussed some side effects and wt gain is not a common one. Monitor BP at home. Low salt diet.  Spent 40 minutes with pt.  During this time history was obtained and documented, examination was performed, prior labs reviewed, and assessment/plan discussed.  Return in about 4 months (around 12/21/2020).   Dajae Kizer G. [name], MD  [facility]. [place] office.  A few things to remember from today's visit:   Vitamin D deficiency - Plan: VITAMIN D 25 Hydroxy (Vit-D Deficiency, Fractures)  Peripheral polyneuropathy - Plan: TSH  Abdominal pain, generalized - Plan: Basic metabolic panel, CBC  Morbid obesity ([place]) - Plan: TSH  If you need refills please call your pharmacy. Do not use My Chart to request refills or for acute issues that need immediate attention.   No changes today. Continue monitoring wt and food diary. Low impact exercise.  Follow a bland diet for the next few days, small meals, adequate hydration.   GET HELP RIGHT AWAY IF:   The pain is does not go away within 2 hours.  Sudden severe/worsening pain.  You keep throwing up (vomiting).  The pain changes and is only in the right or left part of the belly.  Not being able to pass gas or poop.  You have bloody or tarry looking poop.   MAKE SURE YOU:   Understand these instructions.  Will watch your condition.  Will get help right away if you are not doing well or get worse.   If symptoms are persistent please arrange a follow up appointment.    Please be sure medication list is accurate. If a new problem present, please set up appointment sooner than planned today.

## 2020-08-20 NOTE — Patient Instructions (Addendum)
A few things to remember from today's visit:   Vitamin D deficiency - Plan: VITAMIN D 25 Hydroxy (Vit-D Deficiency, Fractures)  Peripheral polyneuropathy - Plan: TSH  Abdominal pain, generalized - Plan: Basic metabolic panel, CBC  Morbid obesity ([place]) - Plan: TSH  If you need refills please call your pharmacy. Do not use My Chart to request refills or for acute issues that need immediate attention.   No changes today. Continue monitoring wt and food diary. Low impact exercise.  Follow a bland diet for the next few days, small meals, adequate hydration.   GET HELP RIGHT AWAY IF:   The pain is does not go away within 2 hours.  Sudden severe/worsening pain.  You keep throwing up (vomiting).  The pain changes and is only in the right or left part of the belly.  Not being able to pass gas or poop.  You have bloody or tarry looking poop.   MAKE SURE YOU:   Understand these instructions.  Will watch your condition.  Will get help right away if you are not doing well or get worse.   If symptoms are persistent please arrange a follow up appointment.    Please be sure medication list is accurate. If a new problem present, please set up appointment sooner than planned today.

## 2020-08-26 ENCOUNTER — Ambulatory Visit: Payer: Medicare HMO

## 2020-08-26 ENCOUNTER — Other Ambulatory Visit: Payer: Self-pay

## 2020-08-26 ENCOUNTER — Ambulatory Visit: Payer: Medicare HMO | Attending: Neurology

## 2020-08-26 DIAGNOSIS — R29898 Other symptoms and signs involving the musculoskeletal system: Secondary | ICD-10-CM | POA: Diagnosis not present

## 2020-08-26 DIAGNOSIS — M542 Cervicalgia: Secondary | ICD-10-CM | POA: Diagnosis not present

## 2020-08-26 DIAGNOSIS — R2681 Unsteadiness on feet: Secondary | ICD-10-CM | POA: Diagnosis not present

## 2020-08-26 NOTE — Therapy (Signed)
OUTPATIENT PHYSICAL THERAPY DISCHARGE NOTE   Patient Name: Amanda Cordova MRN: [id number] DOB:[birth year], 60 y.o., female Today's Date: 08/26/2020  PCP: [name], [name], MD REFERRING PROVIDER: Karsten Ro, DO     PT End of Session - 08/26/20 0850    Visit Number 5    Number of Visits 10    Date for PT Re-Evaluation 09/30/20    Authorization Type [employer]    Progress Note Due on Visit 10    PT Start Time 0845    PT Stop Time 0925    PT Time Calculation (min) 40 min    Activity Tolerance Patient tolerated treatment well    Behavior During Therapy [facility] for tasks assessed/performed           Past Medical History:  Diagnosis Date  . Acoustic neuroma ([place])   . CAD (coronary artery disease)   . COPD (chronic obstructive pulmonary disease) ([place])   . Family history of bladder cancer   . Family history of breast cancer   . Family history of thyroid cancer   . Hyperlipemia   . Hypertension   . Neuropathy   . Optic neuritis   . Sleep apnea    wears cpap   Past Surgical History:  Procedure Laterality Date  . ABDOMINAL HYSTERECTOMY    . ANTERIOR CERVICAL DECOMP/DISCECTOMY FUSION N/A 05/28/2020   Procedure: ANTERIOR CERVICAL DECOMPRESSION FUSION CERVICAL THREE-FOUR.;  Surgeon: Karsten Ro, DO;  Location: [place] [facility];  Service: Neurosurgery;  Laterality: N/A;  anterior  . BREAST EXCISIONAL BIOPSY Right 2000   benign  . cerical fusion  1995  . CHOLECYSTECTOMY    . 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 HYSTERECTOMY ABDOMINAL WITH SALPINGECTOMY    . MENISCUS REPAIR Left    Patient Active Problem List   Diagnosis Date Noted  . Peripheral neuropathy 08/20/2020  . Genetic testing 07/21/2020  . History of optic neuritis 07/13/2020  . Hyperlipidemia 07/13/2020  . Insomnia 07/13/2020  . Obstructive sleep apnea syndrome 07/13/2020  . Osteopenia 07/13/2020  . Plantar nerve lesion 07/13/2020  . Prediabetes 07/13/2020  . Vitamin D deficiency 07/13/2020  .  Family history of breast cancer   . Family history of bladder cancer   . Family history of thyroid cancer   . Cervical myelopathy ([place]) 05/28/2020  . Cerebral vascular disease 04/01/2020  . History of acoustic neuroma 04/01/2020  . Cervical stenosis (uterine cervix) 04/01/2020  . Paresthesia 03/05/2020  . Gait abnormality 03/05/2020  . Raynaud's phenomenon without gangrene 12/25/2019  . Emphysema of lung ([place]) 07/09/2019  . CAD (coronary artery disease) 06/19/2019  . History of craniotomy 06/19/2019  . Morbid obesity ([place]) 06/19/2019  . Unspecified optic neuritis 01/25/2018  . Hypertension 01/10/2018  . Optic neuritis, left 01/09/2018  . Cervical post-laminectomy syndrome 05/09/2016  . Cervical myofascial pain syndrome 05/09/2016  . DDD (degenerative disc disease), cervical 05/09/2016  . Cervical spondylosis without myelopathy 05/09/2016    Onset date: 05/28/20 REFERRING DIAG: cervical myelopathy  PCP: [name], [name], MD REFERRING PROVIDER: Dawley, Theodoro Doing, DO    THERAPY DIAG:  Neck pain  Unsteadiness on feet  Arm weakness  SUBJECTIVE: Pt reports she is doing pretty good. No pain currently.  PAIN:  Are you having pain? No    OBJECTIVE:   CERVICAL AROM/PROM A/PROM A/PROM (deg) 07/22/2020 AROM (07/29/20) Pre session AROM 08/12/20 AROM 08/26/20  Flexion 34 34 55 45  Extension 45 45 52   Right lateral flexion 18 NT  25   Left lateral flexion 14 NT 20   Right rotation 40 55 55 57  Left rotation 45 49 55 57       NT = not tested  TODAY'S TREATMENT: 08/26/20: Movement with mobilization: unilateral PA mobilization with contralateral cervical rotation Central PA mobilization with cervical retraction- unilateral on L at C1-2 STM and myofascial release to bil upper trap, levator scapulae Seated cervical extension with platisma stretch: 5 x 10"  Myofascial release:    08/12/20: Movement with mobilization: unilateral PA mobilization with contralateral cervical  rotation Central PA mobilization with cervical retraction- unilateral on L at C1-2 STM and myofascial release to bil upper trap, levator scapulae R sidelying: L scapulothoracic upglide, downglides, protraction and retraction, passively with scapular depression hold to stretch soft tissue L rib 1 PA mobilization unilateral, grade IV Cervical retraction with overpressure at Chin by patient and cervical flexion with overpressure at top of head: 2 x 10 Myofascial release to anterior neck musculature wih    08/05/20:   Movement with mobilization: unilateral PA mobilization with contralateral cervical rotation Central PA mobilization with cervical retraction STM and myofascial release to bil upper trap, levator scapulae R sidelying: L scapulothoracic upglide, downglides, protraction and retraction, passively with scapular depression hold to stretch soft tissue L rib 1 PA mobilization unilateral, grade IV  07/22/20: Movement with mobilization: unilateral PA mobilization with contralateral cervical rotation Central PA mobilization with cervical retraction 07/29/20 Movement with mobilization: PA mobs with cervical retraction: central and unilateral on L and R focused on OA, C1-2 Movement with mobilization: lateral glides: OA on L  Cervical ROM: after manual therapy  Flexion: 42 deg  Extension:  R lateral flexion: 25 deg  L lateral flexion: 25 deg  R rotation:60 deg  L rotation: 55 deg Strap mobilization: PA pressure with cervical retractions: 20x Strap mobilization: unilateral PA with contralateral cervical rotations: 20x R and L Part of the FGA completed    PATIENT EDUCATION: Education details: Patient Person educated: Patient Education method: Customer service manager Education comprehension: verbalized understanding     HOME EXERCISE PROGRAM: Access Code: [email or web address] URL: [email or web address] Date: 07/22/2020 Prepared by: Markus Jarvis   Exercises Seated  Assisted Cervical Rotation with Towel - 3-5 x daily - 7 x weekly - 10 reps   On 07/29/20: Verbally reviewed with patient: added strap mobilization with PA and cervical retraction and seated horizontal and vertical head turns (10 -20 reps, 1x/day) for vestibular desensitization.   ASSESSMENT:   CLINICAL IMPRESSION: Pt has been seen for total of 5 session from  07/22/20 to 08/26/20. Patient has met 3/5 long term goals. Patient has demonstrated significant improvement in her cervical ROM since her initial evaluation. Patient has not reported any headaches since her initial evaluations. Patient has reached her maximum potential at this time for her cervical ROM and pain and will be discharged from skilled PT with independent home exercise program. Patient verbalized agreement with plan of care.  REHAB POTENTIAL: Good   CLINICAL DECISION MAKING: Stable/uncomplicated   EVALUATION COMPLEXITY: Low     GOALS: Goals reviewed with patient? Yes   SHORT TERM GOALS: =  Long term goals   LONG TERM GOALS:    LTG Name Target Date Goal status  1 Patient will demo at least 50 deg of cervical flexion without pain to improve ability to read literature on her lap Comments: Eval 34 deg; 08/12/20 55 deg 09/30/20 Goal met   2 Patient will report no instances of L leg buckling  and falls in 5 consecutive weeks to reduce fall frequency and improve safety awareness. Comments: Had 2 falls since 05/2020 09/30/20 Goal met  3 Patient will demo 70 deg of cervical rotation bil without pain to improve ability to drive. Comments: 40 deg to R & 45 deg to L (eval); 57 deg bil (08/26/20) 09/30/20 Not met  4 Patient will report <1-2 headaches per week to improve functional endurance. Comments: on eval had headache for 3 days straight; no headache since first visit (08/12/20) 09/30/20 Goal met  5 Patient will demo improve grip strength in L UE by 5lbs to improve grip strength in L UE with ADLs. Comments: 42lbs L & 60lbs R (eval) 09/30/20  Not tested    PLAN: Discharge from skilled PT      Kerrie Pleasure, PT 08/26/2020, 9:30 AM    [place] [address] [place], [place], [address] Phone: [phone]   Fax:  [phone]  Patient name: Amanda Cordova MRN: [id number] DOB: Apr 13, 1961

## 2020-08-27 ENCOUNTER — Other Ambulatory Visit: Payer: Medicare HMO

## 2020-08-27 ENCOUNTER — Ambulatory Visit
Admission: RE | Admit: 2020-08-27 | Discharge: 2020-08-27 | Disposition: A | Discharge status: Home / Self Care | Payer: Medicare HMO | Source: Ambulatory Visit | Attending: Surgery | Admitting: Surgery

## 2020-08-27 ENCOUNTER — Other Ambulatory Visit (HOSPITAL_COMMUNITY): Payer: Self-pay | Admitting: Diagnostic Radiology

## 2020-08-27 DIAGNOSIS — R9389 Abnormal findings on diagnostic imaging of other specified body structures: Secondary | ICD-10-CM

## 2020-08-27 DIAGNOSIS — D242 Benign neoplasm of left breast: Secondary | ICD-10-CM | POA: Diagnosis not present

## 2020-08-27 DIAGNOSIS — N6321 Unspecified lump in the left breast, upper outer quadrant: Secondary | ICD-10-CM | POA: Diagnosis not present

## 2020-08-27 MED ORDER — GADOBENATE DIMEGLUMINE 529 MG/ML IV SOLN
10.0000 mL | Freq: Once | INTRAVENOUS | Status: AC | PRN
  Administered 2020-08-27: 10 mL via INTRAVENOUS

## 2020-09-02 ENCOUNTER — Ambulatory Visit: Payer: Medicare HMO

## 2020-09-09 ENCOUNTER — Ambulatory Visit: Payer: Medicare HMO

## 2020-09-16 ENCOUNTER — Ambulatory Visit: Payer: Medicare HMO

## 2020-09-19 ENCOUNTER — Other Ambulatory Visit: Payer: Self-pay | Admitting: Family Medicine

## 2020-09-21 NOTE — Telephone Encounter (Signed)
Last filled: 08/16/20 Last OV: 08/20/20 Next OV: 12/22/20

## 2020-09-23 ENCOUNTER — Ambulatory Visit: Payer: Medicare HMO

## 2020-09-30 ENCOUNTER — Ambulatory Visit: Payer: Medicare HMO

## 2020-10-27 ENCOUNTER — Other Ambulatory Visit: Payer: Self-pay

## 2020-10-27 ENCOUNTER — Encounter: Payer: Self-pay | Admitting: Podiatry

## 2020-10-27 ENCOUNTER — Ambulatory Visit: Payer: Medicare HMO | Admitting: Podiatry

## 2020-10-27 DIAGNOSIS — B351 Tinea unguium: Secondary | ICD-10-CM

## 2020-10-27 DIAGNOSIS — M25571 Pain in right ankle and joints of right foot: Secondary | ICD-10-CM | POA: Diagnosis not present

## 2020-10-27 DIAGNOSIS — G8929 Other chronic pain: Secondary | ICD-10-CM | POA: Diagnosis not present

## 2020-10-27 DIAGNOSIS — M79674 Pain in right toe(s): Secondary | ICD-10-CM

## 2020-10-27 DIAGNOSIS — M79675 Pain in left toe(s): Secondary | ICD-10-CM

## 2020-10-27 DIAGNOSIS — Z8781 Personal history of (healed) traumatic fracture: Secondary | ICD-10-CM

## 2020-10-27 DIAGNOSIS — G5793 Unspecified mononeuropathy of bilateral lower limbs: Secondary | ICD-10-CM

## 2020-10-27 DIAGNOSIS — L84 Corns and callosities: Secondary | ICD-10-CM

## 2020-10-27 NOTE — Progress Notes (Signed)
Subjective: Amanda Cordova is a pleasant 60 y.o. female patient seen today with h/o neuropathy, callus right heel and painful thick toenails that are difficult to trim. Pain interferes with ambulation. Aggravating factors include wearing enclosed shoe gear. Pain is relieved with periodic professional debridement.  Patient states she has h/o right ankle fracture (?2019?) and she has some residual pain. She has noticed increased swelling of her ankle lately and would like to have it evaluated  She is also concerned about lesion posterior right heel and states it's been present for a long time and she has filed it down to manage it.   PCP is [name], [name], MD. Last visit was: 03/25/2020.  Allergies  Allergen Reactions   Gabapentin Anaphylaxis and Swelling    Throat and tongue swells Throat and tongue swells Other reaction(s): eyes swelled, throat, hands,   Tramadol Swelling    Throat and hands   Crestor [Rosuvastatin] Other (See Comments)   Duloxetine Nausea And Vomiting   Tramadol Hcl     Other reaction(s): nausea/dizzy/blurry vision/hands tingling   Lisinopril Nausea Only    Objective: Physical Exam  General: Aslee Such is a pleasant 60 y.o. Caucasian female, morbidly obese in NAD. AAO x 3.   Vascular:  Capillary refill time to digits immediate b/l. Palpable pedal pulses b/l LE. Pedal hair present. Lower extremity skin temperature gradient within normal limits. No pain with calf compression b/l. Trace edema noted right ankle.  Dermatological:  Pedal skin with normal turgor, texture and tone b/l lower extremities No open wounds b/l lower extremities No interdigital macerations b/l lower extremities Toenails 1-5 b/l elongated, discolored, dystrophic, thickened, crumbly with subungual debris and tenderness to dorsal palpation. Hyperkeratotic lesion(s) posterior right heel. There are no thrombosed capillaries to indicate verruca.  No erythema, no edema, no drainage,  no fluctuance.  Musculoskeletal:  Normal muscle strength 5/5 to all lower extremity muscle groups bilaterally. Mild discomfort with right ankle joint ROM. No edema, no warmth, no erythema.  Neurological:  Pt has subjective symptoms of neuropathy. Protective sensation intact 5/5 intact bilaterally with 10g monofilament b/l. Vibratory sensation intact b/l.  Assessment and Plan:  1. Pain due to onychomycosis of toenails of both feet   2. Callus   3. Neuropathy of both feet   4. Chronic pain of right ankle   5. History of fracture of right ankle      -Examined patient. -Patient to continue soft, supportive shoe gear daily. -Toenails 1-5 b/l were debrided in length and girth with sterile nail nippers and dremel without iatrogenic bleeding.  -Patient to report any pedal injuries to medical professional immediately. -Patient referred to Dr. Lanae Crumbly for evaluation of right ankle pain s/p fracture and evaluation of lesion right heel. We also discussed Dermatology consult for lesion right heel. -Patient/POA to call should there be question/concern in the interim.  Return in about 3 months (around 01/27/2021).  Marzetta Board, DPM

## 2020-11-03 ENCOUNTER — Ambulatory Visit (INDEPENDENT_AMBULATORY_CARE_PROVIDER_SITE_OTHER): Payer: Medicare HMO

## 2020-11-03 ENCOUNTER — Encounter: Payer: Self-pay | Admitting: Podiatry

## 2020-11-03 ENCOUNTER — Other Ambulatory Visit: Payer: Self-pay

## 2020-11-03 ENCOUNTER — Telehealth: Payer: Self-pay

## 2020-11-03 ENCOUNTER — Ambulatory Visit (INDEPENDENT_AMBULATORY_CARE_PROVIDER_SITE_OTHER): Payer: Medicare HMO | Admitting: Podiatry

## 2020-11-03 DIAGNOSIS — M25371 Other instability, right ankle: Secondary | ICD-10-CM | POA: Diagnosis not present

## 2020-11-03 DIAGNOSIS — M7751 Other enthesopathy of right foot: Secondary | ICD-10-CM

## 2020-11-03 DIAGNOSIS — M7750 Other enthesopathy of unspecified foot: Secondary | ICD-10-CM

## 2020-11-03 NOTE — Telephone Encounter (Signed)
Orders and demographics faxed to [facility]  [phone] Evaluate and treat Ankle stabilization program Active and passive ROM PRN

## 2020-11-06 NOTE — Progress Notes (Signed)
  Subjective:  Patient ID: Amanda Cordova, female    DOB: [birth year],  MRN: [id number]  Chief Complaint  Patient presents with   Tendonitis    evaluation of right ankle pain per Dr Elisha Ponder    60 y.o. female presents with the above complaint. History confirmed with patient.  She has had a history of ankle sprains before.  The ankle feels he rolls in very easily.  Feels very unstable  Objective:  Physical Exam: warm, good capillary refill, no trophic changes or ulcerative lesions, normal DP and PT pulses, and normal sensory exam.  Right Foot: She does have laxity of the ATFL and CFL slightly more than her left side which is also quite lax   Radiographs: Multiple views x-ray of right ankle: no fracture, dislocation, swelling or major degenerative changes noted Assessment:   1. Ankle instability, right      Plan:  Patient was evaluated and treated and all questions answered.  Discussed treatment options for chronic ankle instability with functional anatomic with her in detail.  She does have quite a bit of laxity.  I recommend physical therapy and bracing.  A Tri-Lock ankle brace was dispensed.  [facility]  She will follow-up with me in 6 weeks after physical therapy.  No follow-ups on file.

## 2020-11-11 DIAGNOSIS — R531 Weakness: Secondary | ICD-10-CM | POA: Diagnosis not present

## 2020-11-11 DIAGNOSIS — M25371 Other instability, right ankle: Secondary | ICD-10-CM | POA: Diagnosis not present

## 2020-11-11 DIAGNOSIS — M25571 Pain in right ankle and joints of right foot: Secondary | ICD-10-CM | POA: Diagnosis not present

## 2020-11-11 DIAGNOSIS — R262 Difficulty in walking, not elsewhere classified: Secondary | ICD-10-CM | POA: Diagnosis not present

## 2020-11-12 DIAGNOSIS — R262 Difficulty in walking, not elsewhere classified: Secondary | ICD-10-CM | POA: Diagnosis not present

## 2020-11-12 DIAGNOSIS — M25571 Pain in right ankle and joints of right foot: Secondary | ICD-10-CM | POA: Diagnosis not present

## 2020-11-12 DIAGNOSIS — R531 Weakness: Secondary | ICD-10-CM | POA: Diagnosis not present

## 2020-11-12 DIAGNOSIS — M25371 Other instability, right ankle: Secondary | ICD-10-CM | POA: Diagnosis not present

## 2020-11-16 DIAGNOSIS — G959 Disease of spinal cord, unspecified: Secondary | ICD-10-CM | POA: Diagnosis not present

## 2020-11-16 DIAGNOSIS — Z981 Arthrodesis status: Secondary | ICD-10-CM | POA: Diagnosis not present

## 2020-11-17 DIAGNOSIS — M25571 Pain in right ankle and joints of right foot: Secondary | ICD-10-CM | POA: Diagnosis not present

## 2020-11-17 DIAGNOSIS — R531 Weakness: Secondary | ICD-10-CM | POA: Diagnosis not present

## 2020-11-17 DIAGNOSIS — R262 Difficulty in walking, not elsewhere classified: Secondary | ICD-10-CM | POA: Diagnosis not present

## 2020-11-17 DIAGNOSIS — M25371 Other instability, right ankle: Secondary | ICD-10-CM | POA: Diagnosis not present

## 2020-11-20 DIAGNOSIS — R531 Weakness: Secondary | ICD-10-CM | POA: Diagnosis not present

## 2020-11-20 DIAGNOSIS — R262 Difficulty in walking, not elsewhere classified: Secondary | ICD-10-CM | POA: Diagnosis not present

## 2020-11-20 DIAGNOSIS — M25571 Pain in right ankle and joints of right foot: Secondary | ICD-10-CM | POA: Diagnosis not present

## 2020-11-20 DIAGNOSIS — M25371 Other instability, right ankle: Secondary | ICD-10-CM | POA: Diagnosis not present

## 2020-11-21 ENCOUNTER — Other Ambulatory Visit: Payer: Self-pay | Admitting: Family Medicine

## 2020-11-21 DIAGNOSIS — J432 Centrilobular emphysema: Secondary | ICD-10-CM

## 2020-11-30 ENCOUNTER — Ambulatory Visit: Payer: Medicare HMO | Admitting: Physician Assistant

## 2020-11-30 ENCOUNTER — Other Ambulatory Visit: Payer: Self-pay

## 2020-11-30 ENCOUNTER — Encounter: Payer: Self-pay | Admitting: Physician Assistant

## 2020-11-30 VITALS — BP 128/72 | HR 61 | Ht 64.0 in | Wt 249.0 lb

## 2020-11-30 DIAGNOSIS — R072 Precordial pain: Secondary | ICD-10-CM

## 2020-11-30 DIAGNOSIS — I25119 Atherosclerotic heart disease of native coronary artery with unspecified angina pectoris: Secondary | ICD-10-CM

## 2020-11-30 DIAGNOSIS — I10 Essential (primary) hypertension: Secondary | ICD-10-CM | POA: Diagnosis not present

## 2020-11-30 DIAGNOSIS — E785 Hyperlipidemia, unspecified: Secondary | ICD-10-CM

## 2020-11-30 NOTE — Patient Instructions (Signed)
Medication Instructions:  Your physician recommends that you continue on your current medications as directed. Please refer to the Current Medication list given to you today.  *If you need a refill on your cardiac medications before your next appointment, please call your pharmacy*  Lab Work: Your physician recommends that you return for lab work in at your earliest convenience:  Fasting Lipid Panel-DO NOT EAT OR DRINK PAST MIDNIGHT. OKAY TO HAVE WATER.  Hepatic (Liver) Function Test  If you have labs (blood work) drawn today and your tests are completely normal, you will receive your results only by: MyChart Message (if you have MyChart) OR A paper copy in the mail If you have any lab test that is abnormal or we need to change your treatment, we will call you to review the results.  Testing/Procedures: Your physician has requested that you have a lexiscan myoview. For further information please visit [email or web address]. Please follow instruction sheet, as given.  Please schedule for 1-2 weeks at [facility] office    Follow-Up: At [facility], you and your health needs are our priority.  As part of our continuing mission to provide you with exceptional heart care, we have created designated Provider Care Teams.  These Care Teams include your primary Cardiologist (physician) and Advanced Practice Providers (APPs -  Physician Assistants and Nurse Practitioners) who all work together to provide you with the care you need, when you need it.  Your next appointment:   4-6 week(s)  The format for your next appointment:   In Person  Provider:   Almyra Deforest, PA-C  Other Instructions

## 2020-11-30 NOTE — Progress Notes (Signed)
Cardiology Office Note:    Date:  12/02/2020   ID:  Amanda Cordova, DOB 06-Jun-1960, MRN [id number]  PCP:  [name], [name], MD   [facility] Providers Cardiologist:  Kirk Ruths, MD     Referring MD: [name], [name], MD   Chief Complaint  Patient presents with   Follow-up    Chest discomfort     History of Present Illness:    Amanda Cordova is a 60 y.o. female with a hx of coronary artery calcification seen on CT scan, tobacco abuse, COPD, hypertension and hyperlipidemia.  Patient previously had chest CT for lung cancer screening and noted to have aortic atherosclerosis and coronary artery calcification.  Myoview obtained in November 2019 showed EF 60%, attenuation artifact but no ischemia.  Echocardiogram in November 2019 showed normal EF, moderate LVH, mild TR.  Patient was last seen by Dr. Stanford Breed in September 2021 at which time she was doing well without any exertional chest pain or worsening dyspnea.  She has been unable to tolerate Lipitor and Crestor.  She was placed on pravastatin 40 mg daily, unfortunately she was unable to tolerate this either.  Patient was eventually switched to Zetia 10 mg daily.  Since the last visit, patient has underwent cervical fusion surgery in February 2022.  Patient presents today for follow-up.  In the past several months, she has been noticing some pressure in the chest.  This is especially noticeable during exertion.  She is not sure if this is related to her lung issue of breath or heart issue.  I recommend a nuclear stress test to further assess.  Otherwise she has no lower extremity edema, orthopnea or PND.   Past Medical History:  Diagnosis Date   Acoustic neuroma ([place])    CAD (coronary artery disease)    COPD (chronic obstructive pulmonary disease) (HCC)    Family history of bladder cancer    Family history of breast cancer    Family history of thyroid cancer    Hyperlipemia    Hypertension    Neuropathy    Optic  neuritis    Sleep apnea    wears cpap    Past Surgical History:  Procedure Laterality Date   ABDOMINAL HYSTERECTOMY     ANTERIOR CERVICAL DECOMP/DISCECTOMY FUSION N/A 05/28/2020   Procedure: ANTERIOR CERVICAL DECOMPRESSION FUSION CERVICAL THREE-FOUR.;  Surgeon: Karsten Ro, DO;  Location: [place];  Service: Neurosurgery;  Laterality: N/A;  anterior   BREAST EXCISIONAL BIOPSY Right 2000   benign   cerical fusion  1995   CHOLECYSTECTOMY     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   HYSTERECTOMY ABDOMINAL WITH SALPINGECTOMY     MENISCUS REPAIR Left     Current Medications: Current Meds  Medication Sig   albuterol (VENTOLIN HFA) 108 (90 Base) MCG/ACT inhaler INHALE 2 PUFFS INTO THE LUNGS EVERY 6 HOURS AS NEEDED FOR WHEEZING OR SHORTNESS OF BREATH (Patient taking differently: Inhale 2 puffs into the lungs every 6 (six) hours as needed for wheezing or shortness of breath.)   ALPRAZolam (XANAX) 0.25 MG tablet TAKE 1 TABLET(0.25 MG) BY MOUTH DAILY AS NEEDED FOR ANXIETY   amLODipine (NORVASC) 5 MG tablet TAKE 1 TABLET(5 MG) BY MOUTH DAILY   aspirin 81 MG chewable tablet    aspirin EC 81 MG tablet Take 1 tablet (81 mg total) by mouth daily. RESTART in 7 days   clobetasol cream (TEMOVATE) [id number] % 1 application   clotrimazole-betamethasone (LOTRISONE) cream 1 application to  affected area   Multiple Vitamins-Minerals (CENTRUM SILVER PO) Take 1 tablet by mouth daily.   pravastatin (PRAVACHOL) 10 MG tablet TAKE 1 TABLET(10 MG) BY MOUTH DAILY   Respiratory Therapy Supplies (CARETOUCH 2 CPAP HOSE HANGER) MISC    WIXELA INHUB 250-50 MCG/ACT AEPB INHALE 1 PUFF INTO THE LUNGS TWICE DAILY     Allergies:   Gabapentin, Tramadol, Crestor [rosuvastatin], Duloxetine, Tramadol hcl, and Lisinopril   Social History   Socioeconomic History   Marital status: Married    Spouse name: Not on file   Number of children: 4   Years of education: 12   Highest education level: High school graduate   Occupational History   Occupation: Disabled  Tobacco Use   Smoking status: Former    Packs/day: 1.00    Years: 35.00    Pack years: 35.00    Types: Cigarettes    Quit date: 01/09/2018    Years since quitting: 2.8   Smokeless tobacco: Never  Substance and Sexual Activity   Alcohol use: Not Currently   Drug use: Never   Sexual activity: Not on file  Other Topics Concern   Not on file  Social History Narrative   Lives at home with her husband and her great-grandson.   Right-handed.   2-3 cups caffeine per day.    Social Determinants of Health   Financial Resource Strain: Low Risk    Difficulty of Paying Living Expenses: Not hard at all  Food Insecurity: No Food Insecurity   Worried About Charity fundraiser in the Last Year: Never true   [place] in the Last Year: Never true  Transportation Needs: No Transportation Needs   Lack of Transportation (Medical): No   Lack of Transportation (Non-Medical): No  Physical Activity: Sufficiently Active   Days of Exercise per Week: 7 days   Minutes of Exercise per Session: 30 min  Stress: No Stress Concern Present   Feeling of Stress : Not at all  Social Connections: Moderately Isolated   Frequency of Communication with Friends and Family: More than three times a week   Frequency of Social Gatherings with Friends and Family: Once a week   Attends Religious Services: Never   Marine scientist or Organizations: No   Attends Music therapist: Never   Marital Status: Married     Family History: The patient's family history includes Bladder Cancer in her maternal uncle and another family member; Breast cancer in her sister and sister; Breast cancer (age of onset: 51) in her sister; COPD in her mother; Diabetes in her maternal aunt; Diabetes (age of onset: 66) in her sister; Heart attack in her father; Heart failure in her mother; Thyroid cancer in an other family member.  ROS:   Please see the history of present  illness.     All other systems reviewed and are negative.  EKGs/Labs/Other Studies Reviewed:    The following studies were reviewed today:  Echo 03/05/2018 LV EF: 55% -   60%  Study Conclusions   - Left ventricle: The cavity size was normal. There was moderate    concentric hypertrophy. Systolic function was normal. The    estimated ejection fraction was in the range of 55% to 60%. Wall    motion was normal; there were no regional wall motion    abnormalities. Left ventricular diastolic function parameters    were normal.  - Aortic valve: Trileaflet; normal thickness leaflets.  - Aortic root: The aortic  root was normal in size.  - Right ventricle: Systolic function was normal.  - Right atrium: The atrium was normal in size.  - Tricuspid valve: There was mild regurgitation.  - Pulmonary arteries: Systolic pressure was within the normal    range.  - Inferior vena cava: The vessel was normal in size.  - Pericardium, extracardiac: There was no pericardial effusion.     Myoview 03/09/2018 The left ventricular ejection fraction is normal (55-65%). Nuclear stress EF: 60%. Blood pressure demonstrated a hypotensive response to exercise. Nondiagnostic EKG due to baseline diffuse ST/T wave abnormality. There is a small defect of mild severity present in the apical anterior location. In the setting of normal LVF this is consistent with attenuation artifact. No ischemia noted. This is a low risk study.    EKG:  EKG is ordered today.  The ekg ordered today demonstrates normal sinus rhythm, no significant ST-T wave changes  Recent Labs: 04/28/2020: ALT 13 08/20/2020: BUN 14; Creatinine, Ser 0.70; Hemoglobin 14.9; Platelets 291.0; Potassium 4.2; Sodium 144; TSH 2.66  Recent Lipid Panel    Component Value Date/Time   CHOL 167 04/28/2020 0849   TRIG 131 04/28/2020 0849   HDL 37 (Amanda) 04/28/2020 0849   CHOLHDL 4.5 (H) 04/28/2020 0849   LDLCALC 106 (H) 04/28/2020 0849     Risk  Assessment/Calculations:           Physical Exam:    VS:  BP 128/72   Pulse 61   Ht '5\' 4"'$  (1.626 m)   Wt 249 lb (112.9 kg)   SpO2 97%   BMI 42.74 kg/m     Wt Readings from Last 3 Encounters:  11/30/20 249 lb (112.9 kg)  08/20/20 245 lb (111.1 kg)  07/01/20 242 lb 2 oz (109.8 kg)     GEN:  Well nourished, well developed in no acute distress HEENT: Normal NECK: No JVD; No carotid bruits LYMPHATICS: No lymphadenopathy CARDIAC: RRR, no murmurs, rubs, gallops RESPIRATORY:  Clear to auscultation without rales, wheezing or rhonchi  ABDOMEN: Soft, non-tender, non-distended MUSCULOSKELETAL:  No edema; No deformity  SKIN: Warm and dry NEUROLOGIC:  Alert and oriented x 3 PSYCHIATRIC:  Normal affect   ASSESSMENT:    1. Precordial pain   2. Hyperlipidemia, unspecified hyperlipidemia type   3. Coronary artery disease involving native coronary artery of native heart with angina pectoris ([place])   4. Primary hypertension    PLAN:    In order of problems listed above:  Precordial chest pain: I recommended nuclear stress test to further assess  Hyperlipidemia: Continue Zetia and pravastatin  Coronary artery calcification: Seen on previous CT image.  Hypertension: Continue on current therapy.   Shared Decision Making/Informed Consent The risks [chest pain, shortness of breath, cardiac arrhythmias, dizziness, blood pressure fluctuations, myocardial infarction, stroke/transient ischemic attack, nausea, vomiting, allergic reaction, radiation exposure, metallic taste sensation and life-threatening complications (estimated to be 1 in 10,000)], benefits (risk stratification, diagnosing coronary artery disease, treatment guidance) and alternatives of a nuclear stress test were discussed in detail with Ms. Paulos and she agrees to proceed.    Medication Adjustments/Labs and Tests Ordered: Current medicines are reviewed at length with the patient today.  Concerns regarding medicines are  outlined above.  Orders Placed This Encounter  Procedures   Hepatic function panel   Lipid panel   MYOCARDIAL PERFUSION IMAGING   EKG 12-Lead   No orders of the defined types were placed in this encounter.   Patient Instructions  Medication Instructions:  Your physician recommends that you continue on your current medications as directed. Please refer to the Current Medication list given to you today.  *If you need a refill on your cardiac medications before your next appointment, please call your pharmacy*  Lab Work: Your physician recommends that you return for lab work in at your earliest convenience:  Fasting Lipid Panel-DO NOT EAT OR DRINK PAST MIDNIGHT. OKAY TO HAVE WATER.  Hepatic (Liver) Function Test  If you have labs (blood work) drawn today and your tests are completely normal, you will receive your results only by: MyChart Message (if you have MyChart) OR A paper copy in the mail If you have any lab test that is abnormal or we need to change your treatment, we will call you to review the results.  Testing/Procedures: Your physician has requested that you have a lexiscan myoview. For further information please visit [email or web address]. Please follow instruction sheet, as given.  Please schedule for 1-2 weeks at [facility] office    Follow-Up: At [facility], you and your health needs are our priority.  As part of our continuing mission to provide you with exceptional heart care, we have created designated Provider Care Teams.  These Care Teams include your primary Cardiologist (physician) and Advanced Practice Providers (APPs -  Physician Assistants and Nurse Practitioners) who all work together to provide you with the care you need, when you need it.  Your next appointment:   4-6 week(s)  The format for your next appointment:   In Person  Provider:   Almyra Deforest, PA-C  Other Instructions     Signed, Almyra Deforest, [place]  12/02/2020 11:34 PM    [place]

## 2020-12-01 DIAGNOSIS — R531 Weakness: Secondary | ICD-10-CM | POA: Diagnosis not present

## 2020-12-01 DIAGNOSIS — M25371 Other instability, right ankle: Secondary | ICD-10-CM | POA: Diagnosis not present

## 2020-12-01 DIAGNOSIS — R262 Difficulty in walking, not elsewhere classified: Secondary | ICD-10-CM | POA: Diagnosis not present

## 2020-12-01 DIAGNOSIS — M25571 Pain in right ankle and joints of right foot: Secondary | ICD-10-CM | POA: Diagnosis not present

## 2020-12-02 ENCOUNTER — Encounter: Payer: Self-pay | Admitting: Physician Assistant

## 2020-12-04 DIAGNOSIS — M25571 Pain in right ankle and joints of right foot: Secondary | ICD-10-CM | POA: Diagnosis not present

## 2020-12-04 DIAGNOSIS — E785 Hyperlipidemia, unspecified: Secondary | ICD-10-CM | POA: Diagnosis not present

## 2020-12-04 DIAGNOSIS — R262 Difficulty in walking, not elsewhere classified: Secondary | ICD-10-CM | POA: Diagnosis not present

## 2020-12-04 DIAGNOSIS — M25371 Other instability, right ankle: Secondary | ICD-10-CM | POA: Diagnosis not present

## 2020-12-04 DIAGNOSIS — R531 Weakness: Secondary | ICD-10-CM | POA: Diagnosis not present

## 2020-12-05 LAB — HEPATIC FUNCTION PANEL
ALT: 9 IU/L (ref 0–32)
AST: 18 IU/L (ref 0–40)
Albumin: 4.3 g/dL (ref 3.8–4.8)
Alkaline Phosphatase: 103 IU/L (ref 44–121)
Bilirubin Total: 0.5 mg/dL (ref 0.0–1.2)
Bilirubin, Direct: 0.15 mg/dL (ref 0.00–0.40)
Total Protein: 7 g/dL (ref 6.0–8.5)

## 2020-12-05 LAB — LIPID PANEL
Chol/HDL Ratio: 3.3 ratio (ref 0.0–4.4)
Cholesterol, Total: 128 mg/dL (ref 100–199)
HDL: 39 mg/dL — ABNORMAL LOW (ref >39)
LDL Chol Calc (NIH): 68 mg/dL (ref 0–99)
Triglycerides: 112 mg/dL (ref 0–149)
VLDL Cholesterol Cal: 21 mg/dL (ref 5–40)

## 2020-12-07 DIAGNOSIS — G4733 Obstructive sleep apnea (adult) (pediatric): Secondary | ICD-10-CM | POA: Diagnosis not present

## 2020-12-08 DIAGNOSIS — M25371 Other instability, right ankle: Secondary | ICD-10-CM | POA: Diagnosis not present

## 2020-12-08 DIAGNOSIS — R262 Difficulty in walking, not elsewhere classified: Secondary | ICD-10-CM | POA: Diagnosis not present

## 2020-12-08 DIAGNOSIS — R531 Weakness: Secondary | ICD-10-CM | POA: Diagnosis not present

## 2020-12-08 DIAGNOSIS — M25571 Pain in right ankle and joints of right foot: Secondary | ICD-10-CM | POA: Diagnosis not present

## 2020-12-10 DIAGNOSIS — R69 Illness, unspecified: Secondary | ICD-10-CM | POA: Diagnosis not present

## 2020-12-15 ENCOUNTER — Ambulatory Visit: Payer: Medicare HMO | Admitting: Podiatry

## 2020-12-15 DIAGNOSIS — Z0100 Encounter for examination of eyes and vision without abnormal findings: Secondary | ICD-10-CM | POA: Diagnosis not present

## 2020-12-15 DIAGNOSIS — I10 Essential (primary) hypertension: Secondary | ICD-10-CM | POA: Diagnosis not present

## 2020-12-15 DIAGNOSIS — H524 Presbyopia: Secondary | ICD-10-CM | POA: Diagnosis not present

## 2020-12-17 ENCOUNTER — Telehealth: Payer: Self-pay

## 2020-12-17 ENCOUNTER — Telehealth (HOSPITAL_COMMUNITY): Payer: Self-pay

## 2020-12-17 ENCOUNTER — Other Ambulatory Visit: Payer: Self-pay

## 2020-12-17 ENCOUNTER — Other Ambulatory Visit: Payer: Self-pay | Admitting: Family Medicine

## 2020-12-17 ENCOUNTER — Ambulatory Visit: Payer: Medicare HMO | Admitting: Podiatry

## 2020-12-17 DIAGNOSIS — I2510 Atherosclerotic heart disease of native coronary artery without angina pectoris: Secondary | ICD-10-CM

## 2020-12-17 DIAGNOSIS — Q828 Other specified congenital malformations of skin: Secondary | ICD-10-CM

## 2020-12-17 DIAGNOSIS — L608 Other nail disorders: Secondary | ICD-10-CM | POA: Diagnosis not present

## 2020-12-17 DIAGNOSIS — M25371 Other instability, right ankle: Secondary | ICD-10-CM | POA: Diagnosis not present

## 2020-12-17 DIAGNOSIS — L603 Nail dystrophy: Secondary | ICD-10-CM

## 2020-12-17 NOTE — Telephone Encounter (Signed)
Toenail specimen mailed to [facility] for fungal culture

## 2020-12-17 NOTE — Telephone Encounter (Signed)
Spoke with the patient, detailed instructions given. She stated that she would be here for her test. Asked to call back with any questions. She is aware that her test is a 2 day study. S.Glenys Snader EMTP

## 2020-12-22 ENCOUNTER — Ambulatory Visit (HOSPITAL_COMMUNITY): Payer: Medicare HMO | Attending: Cardiology

## 2020-12-22 ENCOUNTER — Other Ambulatory Visit: Payer: Self-pay

## 2020-12-22 ENCOUNTER — Ambulatory Visit: Payer: Medicare HMO | Admitting: Family Medicine

## 2020-12-22 DIAGNOSIS — R072 Precordial pain: Secondary | ICD-10-CM | POA: Insufficient documentation

## 2020-12-22 MED ORDER — TECHNETIUM TC 99M PYROPHOSPHATE
32.4000 | Freq: Once | INTRAVENOUS | Status: AC
  Administered 2020-12-22: 32.4 via INTRAVENOUS

## 2020-12-22 MED ORDER — REGADENOSON 0.4 MG/5ML IV SOLN
0.4000 mg | Freq: Once | INTRAVENOUS | Status: AC
  Administered 2020-12-22: 0.4 mg via INTRAVENOUS

## 2020-12-22 NOTE — Progress Notes (Signed)
  Subjective:  Patient ID: Collene Mares, female    DOB: April 06, 1961,  MRN: [id number]  Chief Complaint  Patient presents with   ankle instability    6 week follow up right    60 y.o. female returns for follow-up with the above complaint. History confirmed with patient.  Therapy has been helpful  Objective:  Physical Exam: warm, good capillary refill, no trophic changes or ulcerative lesions, normal DP and PT pulses, and normal sensory exam.  Dystrophic nail and porokeratosis present  Right Foot: She does have laxity of the ATFL and CFL slightly more than her left side which is also quite lax   Radiographs: Multiple views x-ray of right ankle: no fracture, dislocation, swelling or major degenerative changes noted Assessment:   1. Ankle instability, right   2. Nail dystrophy   3. Porokeratosis      Plan:  Patient was evaluated and treated and all questions answered.  She is improved quite a bit with therapy I recommend continuing her PT for 1 more week and then working on a home exercise plan which hopefully will resolve this.  She is a porokeratosis I recommend she use urea cream 40 to keep under control, her intermittent debridements with Dr. Elisha Ponder have been helpful as well.  I took a culture of the nail plate to evaluate if there is onychomycosis that would be treatable we will discuss this further once we have the results  Return in about 8 weeks (around 02/11/2021) for re-check ankle instability, f/u culture results .

## 2020-12-23 ENCOUNTER — Ambulatory Visit (HOSPITAL_COMMUNITY): Payer: Medicare HMO | Attending: Cardiovascular Disease

## 2020-12-23 ENCOUNTER — Encounter: Payer: Self-pay | Admitting: Family Medicine

## 2020-12-23 ENCOUNTER — Ambulatory Visit (INDEPENDENT_AMBULATORY_CARE_PROVIDER_SITE_OTHER): Payer: Medicare HMO | Admitting: Family Medicine

## 2020-12-23 VITALS — BP 130/80 | HR 76 | Resp 16 | Ht 64.0 in | Wt 245.0 lb

## 2020-12-23 DIAGNOSIS — L309 Dermatitis, unspecified: Secondary | ICD-10-CM | POA: Diagnosis not present

## 2020-12-23 DIAGNOSIS — G629 Polyneuropathy, unspecified: Secondary | ICD-10-CM | POA: Diagnosis not present

## 2020-12-23 DIAGNOSIS — M546 Pain in thoracic spine: Secondary | ICD-10-CM

## 2020-12-23 DIAGNOSIS — H6123 Impacted cerumen, bilateral: Secondary | ICD-10-CM | POA: Diagnosis not present

## 2020-12-23 DIAGNOSIS — R7303 Prediabetes: Secondary | ICD-10-CM | POA: Diagnosis not present

## 2020-12-23 DIAGNOSIS — I10 Essential (primary) hypertension: Secondary | ICD-10-CM | POA: Diagnosis not present

## 2020-12-23 LAB — MYOCARDIAL PERFUSION IMAGING
LV dias vol: 109 mL (ref 46–106)
LV sys vol: 45 mL
Nuc Stress EF: 58 %
Peak HR: 85 {beats}/min
Rest HR: 67 {beats}/min
Rest Nuclear Isotope Dose: 32.1 mCi
SDS: 2
SRS: 0
SSS: 2
ST Depression (mm): 0 mm
Stress Nuclear Isotope Dose: 32.4 mCi
TID: 1.18

## 2020-12-23 LAB — POCT GLYCOSYLATED HEMOGLOBIN (HGB A1C): Hemoglobin A1C: 5.6 % (ref 4.0–5.6)

## 2020-12-23 MED ORDER — TECHNETIUM TC 99M TETROFOSMIN IV KIT
32.1000 | PACK | Freq: Once | INTRAVENOUS | Status: AC | PRN
  Administered 2020-12-23: 32.1 via INTRAVENOUS
  Filled 2020-12-23: qty 33

## 2020-12-23 MED ORDER — AMITRIPTYLINE HCL 25 MG PO TABS: 25.0000 mg | ORAL_TABLET | Freq: Every day | ORAL | 2 refills | Status: DC

## 2020-12-23 NOTE — Assessment & Plan Note (Signed)
A1c today 5.6, improved. A healthful diet and low impact regular physical activity to continue for diabetes prevention.

## 2020-12-23 NOTE — Assessment & Plan Note (Addendum)
BP adequately controlled. Continue current management: Amlodipine 5 mg daily. DASH/low salt diet to continue. Monitor BP at home.

## 2020-12-23 NOTE — Assessment & Plan Note (Signed)
Weight otherwise stable. She understands the benefits of wt loss as well as adverse effects of obesity. Consistency with healthy diet and physical activity recommended. Weight Watchers is a good option, recommended.

## 2020-12-23 NOTE — Progress Notes (Addendum)
HPI:  Amanda Cordova is a 60 y.o. female, who is here today for 4 months follow up.   She was last seen on 08/20/2020, when she was c/o upper abdominal pain, it has greatly improved.  Since her last visit she has seen podiatrist and cardiologist. She had stress test this morning.   Hypertension:  Medications:Amlodipine 5 mg daily. BP readings at home:Most of the time 120-130's/80's, late pm 140's/80's. Side effects:None. Still having mild chest tightness, it happens at rest and no associated symptoms. Negative for unusual or severe headache, visual changes, dyspnea,  focal weakness, or edema.  Lab Results  Component Value Date   CREATININE 0.70 08/20/2020   BUN 14 08/20/2020   NA 144 08/20/2020   K 4.2 08/20/2020   CL 107 08/20/2020   CO2 28 08/20/2020   Bilateral ear pruritus and earache. L>R, she thinks it was caused by dental procedure, had tooth extraction. She was treated with abx. Fullness sensation but no significant hearing or balance changes. Negative for recent travel or URI.  Recently Dx'ed with macular degeneration. She is following with eye care provider.  Chronic tinnitus and residual unstable gait, acoustic neuroma s/p craniotomy. She takes Alprazolam 0.25 mg daily , which helps with balance.   Prediabetes: Negative for polydipsia,polyuria, or polyphagia. She walks her dog 3 times daily and in general she feels like she follows a healthful diet.   Lab Results  Component Value Date   HGBA1C 5.8 (H) 03/05/2020   Peripheral neuropathy. Feet burning sensation. She has seen Dr Krista Blue, she is supposed to follow annually.  Back pain and myalgias attributed to statin and Zetia. No hx of trauma. She is on Pravastatin 10 mg daily and Zetia 10 mg daily and FLP has improved. She has not tolerated other statin medications in the past.  Lab Results  Component Value Date   CHOL 128 12/04/2020   HDL 39 (L) 12/04/2020   LDLCALC 68 12/04/2020    TRIG 112 12/04/2020   CHOLHDL 3.3 12/04/2020   Very pruritic skin rash on elbows noted over 2 months ago. Similar rash left hand, dorsum,noted in 05/2020. Negative for new medication, detergent, soap, or body product. No known insect bite or outdoor exposures to plants. No sick contact. No Hx of eczema or similar rash in the past.  OTC medication for this problem: Cortizone.  She has not identified exacerbating or alleviating factors.  Review of Systems  Constitutional:  Positive for fatigue. Negative for activity change, appetite change and fever.  HENT:  Negative for mouth sores, nosebleeds and sore throat.   Eyes:  Negative for redness and visual disturbance.  Respiratory:  Negative for cough and wheezing.   Cardiovascular:  Negative for leg swelling.  Gastrointestinal:  Negative for nausea and vomiting.       Negative for changes in bowel habits.  Genitourinary:  Negative for decreased urine volume and hematuria.  Musculoskeletal:  Positive for arthralgias, back pain and gait problem.  Neurological:  Negative for syncope, facial asymmetry and weakness.  Psychiatric/Behavioral:  Negative for confusion.   Rest of ROS, see pertinent positives sand negatives in HPI  Current Outpatient Medications on File Prior to Visit  Medication Sig Dispense Refill   albuterol (VENTOLIN HFA) 108 (90 Base) MCG/ACT inhaler INHALE 2 PUFFS INTO THE LUNGS EVERY 6 HOURS AS NEEDED FOR WHEEZING OR SHORTNESS OF BREATH (Patient taking differently: Inhale 2 puffs into the lungs every 6 (six) hours as needed for wheezing or  shortness of breath.) 54 g 1   ALPRAZolam (XANAX) 0.25 MG tablet TAKE 1 TABLET(0.25 MG) BY MOUTH DAILY AS NEEDED FOR ANXIETY 30 tablet 2   amLODipine (NORVASC) 5 MG tablet TAKE 1 TABLET(5 MG) BY MOUTH DAILY 90 tablet 2   aspirin 81 MG chewable tablet      aspirin EC 81 MG tablet Take 1 tablet (81 mg total) by mouth daily. RESTART in 7 days 30 tablet 11   clotrimazole-betamethasone  (LOTRISONE) cream 1 application to affected area     Multiple Vitamins-Minerals (CENTRUM SILVER PO) Take 1 tablet by mouth daily.     pravastatin (PRAVACHOL) 10 MG tablet TAKE 1 TABLET(10 MG) BY MOUTH DAILY 90 tablet 1   Respiratory Therapy Supplies (CARETOUCH 2 CPAP HOSE HANGER) MISC      WIXELA INHUB 250-50 MCG/ACT AEPB INHALE 1 PUFF INTO THE LUNGS TWICE DAILY 60 each 2   ezetimibe (ZETIA) 10 MG tablet Take 1 tablet (10 mg total) by mouth daily. 90 tablet 3   No current facility-administered medications on file prior to visit.   Past Medical History:  Diagnosis Date   Acoustic neuroma ([place])    CAD (coronary artery disease)    COPD (chronic obstructive pulmonary disease) (HCC)    Family history of bladder cancer    Family history of breast cancer    Family history of thyroid cancer    Hyperlipemia    Hypertension    Neuropathy    Optic neuritis    Sleep apnea    wears cpap   Allergies  Allergen Reactions   Gabapentin Anaphylaxis and Swelling    Throat and tongue swells Throat and tongue swells Other reaction(s): eyes swelled, throat, hands,   Tramadol Swelling    Throat and hands   Crestor [Rosuvastatin] Other (See Comments)   Duloxetine Nausea And Vomiting   Tramadol Hcl     Other reaction(s): nausea/dizzy/blurry vision/hands tingling   Lisinopril Nausea Only    Social History   Socioeconomic History   Marital status: Married    Spouse name: Not on file   Number of children: 4   Years of education: 12   Highest education level: High school graduate  Occupational History   Occupation: Disabled  Tobacco Use   Smoking status: Former    Packs/day: 1.00    Years: 35.00    Pack years: 35.00    Types: Cigarettes    Quit date: 01/09/2018    Years since quitting: 2.9   Smokeless tobacco: Never  Substance and Sexual Activity   Alcohol use: Not Currently   Drug use: Never   Sexual activity: Not on file  Other Topics Concern   Not on file  Social History Narrative    Lives at home with her husband and her great-grandson.   Right-handed.   2-3 cups caffeine per day.    Social Determinants of Health   Financial Resource Strain: Low Risk    Difficulty of Paying Living Expenses: Not hard at all  Food Insecurity: No Food Insecurity   Worried About Charity fundraiser in the Last Year: Never true   [place] in the Last Year: Never true  Transportation Needs: No Transportation Needs   Lack of Transportation (Medical): No   Lack of Transportation (Non-Medical): No  Physical Activity: Sufficiently Active   Days of Exercise per Week: 7 days   Minutes of Exercise per Session: 30 min  Stress: No Stress Concern Present   Feeling of Stress :  Not at all  Social Connections: Moderately Isolated   Frequency of Communication with Friends and Family: More than three times a week   Frequency of Social Gatherings with Friends and Family: Once a week   Attends Religious Services: Never   Marine scientist or Organizations: No   Attends Archivist Meetings: Never   Marital Status: Married   Vitals:   12/23/20 1357  BP: 130/80  Pulse: 76  Resp: 16  SpO2: 98%   Wt Readings from Last 3 Encounters:  12/23/20 245 lb (111.1 kg)  12/22/20 249 lb (112.9 kg)  11/30/20 249 lb (112.9 kg)   Body mass index is 42.05 kg/m.  Physical Exam Vitals and nursing note reviewed.  Constitutional:      General: She is not in acute distress.    Appearance: She is well-developed.  HENT:     Head: Normocephalic and atraumatic.     Right Ear: External ear normal.     Left Ear: External ear normal.     Ears:     Comments: Bilateral cerumen impaction.  Could not see left TM. Right TM seen partially.     Mouth/Throat:     Mouth: Mucous membranes are moist.     Pharynx: Oropharynx is clear.  Eyes:     Conjunctiva/sclera: Conjunctivae normal.  Cardiovascular:     Rate and Rhythm: Normal rate and regular rhythm.     Pulses:          Dorsalis pedis  pulses are 2+ on the right side and 2+ on the left side.     Heart sounds: No murmur heard. Pulmonary:     Effort: Pulmonary effort is normal. No respiratory distress.     Breath sounds: Normal breath sounds.  Abdominal:     Palpations: Abdomen is soft. There is no hepatomegaly or mass.     Tenderness: There is no abdominal tenderness.  Lymphadenopathy:     Cervical: No cervical adenopathy.  Skin:    General: Skin is warm.     Findings: Rash present.     Comments: Micropapular confluent erythematous rash on elbows.  Neurological:     General: No focal deficit present.     Mental Status: She is alert and oriented to person, place, and time.     Cranial Nerves: No cranial nerve deficit.     Gait: Gait abnormal.     Comments: Mildly unstable, antalgic gait; assisted with a cane.  Psychiatric:     Comments: Well groomed, good eye contact.   ASSESSMENT AND PLAN:  Amanda Cordova was seen today for 4 months follow-up.  Orders Placed This Encounter  Procedures   POC HgB A1c   Lab Results  Component Value Date   HGBA1C 5.6 12/23/2020   Bilateral impacted cerumen It is very dry, I do not think ear lavage will be successful, sp recommend applying Debrox for 7 days before next visit. Avoid Q tips. Instructed about warning signs.  Dermatitis Possible etiologies discussed. ? Seborrheic dermatitis. Topical clobetasol bid prn for up to 14 days at the time recommended. We discussed some side effects of chronic topical steroid use. If not better, derma evaluation to be considered.  -     clobetasol cream (TEMOVATE) 0.05 %; Apply topically 2 (two) times daily as needed.  Peripheral neuropathy After discussion of some side effects, she would like to try amitriptyline 25 mg at bedtime.  Recommend starting with 1/2 tablet for a few days and  increase to the whole dose if well-tolerated. Continue appropriate foot care.  Prediabetes A1c today 5.6, improved. A healthful diet  and low impact regular physical activity to continue for diabetes prevention.   Hypertension BP adequately controlled. Continue current management: Amlodipine 5 mg daily. DASH/low salt diet to continue. Monitor BP at home.  Morbid obesity ([place]) Weight otherwise stable. She understands the benefits of wt loss as well as adverse effects of obesity. Consistency with healthy diet and physical activity recommended. Weight Watchers is a good option, recommended.  Bilateral thoracic back pain, unspecified chronicity Attributed to statin medication. Recommend holding Pravastatin 10 mg for 2-3 weeks and if pain resolves, she can try taking medication 3 times per week. We discussed CV benefits of stain medications.  I spent a total of 43 minutes in both face to face and non face to face activities for this visit on the date of this encounter. During this time history was obtained and documented, examination was performed, prior labs reviewed, and assessment/plan discussed.   Return in about 4 months (around 04/24/2021).   Jersee Winiarski G. [name], MD  [facility]. [place] office.

## 2020-12-23 NOTE — Assessment & Plan Note (Signed)
After discussion of some side effects, she would like to try amitriptyline 25 mg at bedtime.  Recommend starting with 1/2 tablet for a few days and increase to the whole dose if well-tolerated. Continue appropriate foot care.

## 2020-12-23 NOTE — Patient Instructions (Addendum)
A few things to remember from today's visit:  Peripheral polyneuropathy  Prediabetes  Bilateral impacted cerumen  Essential hypertension  Debrox in ears 7 days before next appt. Continue monitoring blood pressure.  Please let me know where you are going to have your next mammogram.  Weigh Watcher is a good options for wt loss.   If you need refills please call your pharmacy. Do not use My Chart to request refills or for acute issues that need immediate attention.    Please be sure medication list is accurate. If a new problem present, please set up appointment sooner than planned today.

## 2020-12-24 MED ORDER — CLOBETASOL PROPIONATE 0.05 % EX CREA: TOPICAL_CREAM | Freq: Two times a day (BID) | CUTANEOUS | 2 refills | Status: DC | PRN

## 2020-12-25 DIAGNOSIS — R262 Difficulty in walking, not elsewhere classified: Secondary | ICD-10-CM | POA: Diagnosis not present

## 2020-12-25 DIAGNOSIS — R531 Weakness: Secondary | ICD-10-CM | POA: Diagnosis not present

## 2020-12-25 DIAGNOSIS — M25371 Other instability, right ankle: Secondary | ICD-10-CM | POA: Diagnosis not present

## 2020-12-25 DIAGNOSIS — M25571 Pain in right ankle and joints of right foot: Secondary | ICD-10-CM | POA: Diagnosis not present

## 2020-12-28 DIAGNOSIS — R531 Weakness: Secondary | ICD-10-CM | POA: Diagnosis not present

## 2020-12-28 DIAGNOSIS — M25371 Other instability, right ankle: Secondary | ICD-10-CM | POA: Diagnosis not present

## 2020-12-28 DIAGNOSIS — R262 Difficulty in walking, not elsewhere classified: Secondary | ICD-10-CM | POA: Diagnosis not present

## 2020-12-28 DIAGNOSIS — M25571 Pain in right ankle and joints of right foot: Secondary | ICD-10-CM | POA: Diagnosis not present

## 2020-12-31 ENCOUNTER — Other Ambulatory Visit: Payer: Self-pay | Admitting: Surgery

## 2020-12-31 DIAGNOSIS — D249 Benign neoplasm of unspecified breast: Secondary | ICD-10-CM

## 2021-01-11 ENCOUNTER — Ambulatory Visit: Payer: Medicare HMO | Admitting: Physician Assistant

## 2021-01-11 ENCOUNTER — Encounter: Payer: Self-pay | Admitting: Physician Assistant

## 2021-01-11 ENCOUNTER — Other Ambulatory Visit: Payer: Self-pay | Admitting: Family Medicine

## 2021-01-11 ENCOUNTER — Other Ambulatory Visit: Payer: Self-pay

## 2021-01-11 VITALS — BP 128/84 | HR 75 | Ht 64.0 in | Wt 248.8 lb

## 2021-01-11 DIAGNOSIS — J449 Chronic obstructive pulmonary disease, unspecified: Secondary | ICD-10-CM | POA: Diagnosis not present

## 2021-01-11 DIAGNOSIS — I10 Essential (primary) hypertension: Secondary | ICD-10-CM | POA: Diagnosis not present

## 2021-01-11 DIAGNOSIS — E785 Hyperlipidemia, unspecified: Secondary | ICD-10-CM | POA: Diagnosis not present

## 2021-01-11 DIAGNOSIS — R0789 Other chest pain: Secondary | ICD-10-CM

## 2021-01-11 MED ORDER — ISOSORBIDE MONONITRATE ER 30 MG PO TB24: 30.0000 mg | ORAL_TABLET | Freq: Every day | ORAL | 3 refills | Status: DC

## 2021-01-11 NOTE — Progress Notes (Signed)
Cardiology Office Note:    Date:  01/13/2021   ID:  Amanda Cordova, DOB August 01, 1960, MRN [id number]  PCP:  [name], [name], MD   [facility] Providers Cardiologist:  Kirk Ruths, MD     Referring MD: [name], [name], MD   Chief Complaint  Patient presents with   Follow-up    Seen for Dr. Stanford Breed    History of Present Illness:    Amanda Cordova is a 59 y.o. female with a hx of coronary artery calcification seen on CT scan, tobacco abuse, COPD, hypertension and hyperlipidemia.  Patient previously had chest CT for lung cancer screening and noted to have aortic atherosclerosis and coronary artery calcification.  Myoview obtained in November 2019 showed EF 60%, attenuation artifact but no ischemia.  Echocardiogram in November 2019 showed normal EF, moderate LVH, mild TR.  Patient was last seen by Dr. Stanford Breed in September 2021 at which time she was doing well without any exertional chest pain or worsening dyspnea.  She has been unable to tolerate Lipitor and Crestor.  She was placed on pravastatin 40 mg daily, unfortunately she was unable to tolerate this either.  Patient was eventually switched to Zetia 10 mg daily.  Since the last visit, patient has underwent cervical fusion surgery in February 2022.  I last saw the patient on 11/30/2020 for intermittent chest discomfort.  I was not sure if the symptom was related to her lung issue or heart issue.  I decided to order a nuclear stress test which came back normal without ischemia or infarction with normal EF.  Talking with the patient today, she continued to have intermittent sharp chest pain that last from a few seconds to 20 minutes.  Most of the symptoms occur with exertion but may also occur at rest.  Symptom occurs about 1-2 times per week.  I recommended a trial of medical therapy given the negative stress test.  I will place the patient on 30 mg daily of Imdur.  He is on 5 mg of amlodipine.  We can consider reassessment  in 6 weeks, if symptoms still worsens despite medical therapy, may need to consider either definitive evaluation using cardiac catheterization.  Past Medical History:  Diagnosis Date   Acoustic neuroma ([place])    CAD (coronary artery disease)    COPD (chronic obstructive pulmonary disease) (HCC)    Family history of bladder cancer    Family history of breast cancer    Family history of thyroid cancer    Hyperlipemia    Hypertension    Neuropathy    Optic neuritis    Sleep apnea    wears cpap    Past Surgical History:  Procedure Laterality Date   ABDOMINAL HYSTERECTOMY     ANTERIOR CERVICAL DECOMP/DISCECTOMY FUSION N/A 05/28/2020   Procedure: ANTERIOR CERVICAL DECOMPRESSION FUSION CERVICAL THREE-FOUR.;  Surgeon: Karsten Ro, DO;  Location: [place];  Service: Neurosurgery;  Laterality: N/A;  anterior   BREAST EXCISIONAL BIOPSY Right 2000   benign   cerical fusion  1995   CHOLECYSTECTOMY     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   aspirin 81 MG chewable tablet    aspirin EC 81 MG tablet Take 1 tablet (81 mg total) by mouth daily. RESTART in 7 days   clobetasol cream (TEMOVATE) 0.05 % Apply topically 2 (two) times daily as needed.   clotrimazole-betamethasone (LOTRISONE) cream 1 application to affected area   isosorbide mononitrate (IMDUR) 30 MG 24 hr tablet Take 1 tablet (30 mg total) by mouth daily.    Multiple Vitamins-Minerals (CENTRUM SILVER PO) Take 1 tablet by mouth daily.   pravastatin (PRAVACHOL) 10 MG tablet TAKE 1 TABLET(10 MG) BY MOUTH DAILY   Respiratory Therapy Supplies (CARETOUCH 2 CPAP HOSE HANGER) MISC    WIXELA INHUB 250-50 MCG/ACT AEPB INHALE 1 PUFF INTO THE LUNGS TWICE DAILY   [DISCONTINUED] amLODipine (NORVASC) 5 MG tablet TAKE 1 TABLET(5 MG) BY MOUTH DAILY     Allergies:   Gabapentin, Tramadol, Crestor [rosuvastatin], Duloxetine, Tramadol hcl, and Lisinopril   Social History   Socioeconomic History   Marital status: Married    Spouse name: Not on file   Number of children: 4   Years of education: 12   Highest education level: High school graduate  Occupational History   Occupation: Disabled  Tobacco Use   Smoking status: Former    Packs/day: 1.00    Years: 35.00    Pack years: 35.00    Types: Cigarettes    Quit date: 01/09/2018    Years since quitting: 3.0   Smokeless tobacco: Never  Substance and Sexual Activity   Alcohol use: Not Currently   Drug use: Never   Sexual activity: Not on file  Other Topics Concern   Not on file  Social History Narrative   Lives at home with her husband and her great-grandson.   Right-handed.   2-3 cups caffeine per day.    Social Determinants of Health   Financial Resource Strain: Low Risk    Difficulty of Paying Living Expenses: Not hard at all  Food Insecurity: No Food Insecurity   Worried About Charity fundraiser in the Last Year: Never true   [place] in the Last Year: Never true  Transportation Needs: No Transportation Needs   Lack of Transportation (Medical): No   Lack of Transportation (Non-Medical): No  Physical Activity: Sufficiently Active   Days of Exercise per Week: 7 days   Minutes of Exercise per Session: 30 min  Stress: No Stress Concern Present   Feeling of Stress : Not at all  Social Connections: Moderately Isolated   Frequency of Communication with Friends and Family: More than three  times a week   Frequency of Social Gatherings with Friends and Family: Once a week   Attends Religious Services: Never   Marine scientist or Organizations: No   Attends Music therapist: Never   Marital Status: Married     Family History: The patient's family history includes Bladder Cancer in her maternal uncle and another family member; Breast cancer in her sister and sister; Breast cancer (age of onset: 42) in her sister; COPD in her mother; Diabetes in her maternal aunt; Diabetes (age of onset: 75) in her sister; Heart attack in her father; Heart failure in her mother; Thyroid cancer in an other  family member.  ROS:   Please see the history of present illness.     All other systems reviewed and are negative.  EKGs/Labs/Other Studies Reviewed:    The following studies were reviewed today:  Myoview 12/23/2020   The study is normal. The study is low risk.   No ST deviation was noted.   LV perfusion is normal. There is no evidence of ischemia. There is no evidence of infarction.   Left ventricular function is normal. Nuclear stress EF: 58 %. The left ventricular ejection fraction is normal (55-65%). End diastolic cavity size is normal.   Prior study available for comparison from 03/08/2018. Previous apical attenuation more diaphragmatic on this study compared to 2019   Normal resting and stress perfusion. No ischemia or infarction EF 58% some diaphragmatic attenuation on resting images     EKG:  EKG is not ordered today.   Recent Labs: 08/20/2020: BUN 14; Creatinine, Ser 0.70; Hemoglobin 14.9; Platelets 291.0; Potassium 4.2; Sodium 144; TSH 2.66 12/04/2020: ALT 9  Recent Lipid Panel    Component Value Date/Time   CHOL 128 12/04/2020 0904   TRIG 112 12/04/2020 0904   HDL 39 (L) 12/04/2020 0904   CHOLHDL 3.3 12/04/2020 0904   LDLCALC 68 12/04/2020 0904     Risk Assessment/Calculations:           Physical Exam:    VS:  BP 128/84   Pulse 75   Ht 5\' 4"   (1.626 m)   Wt 248 lb 12.8 oz (112.9 kg)   SpO2 96%   BMI 42.71 kg/m     Wt Readings from Last 3 Encounters:  01/11/21 248 lb 12.8 oz (112.9 kg)  12/23/20 245 lb (111.1 kg)  12/22/20 249 lb (112.9 kg)     GEN:  Well nourished, well developed in no acute distress HEENT: Normal NECK: No JVD; No carotid bruits LYMPHATICS: No lymphadenopathy CARDIAC: RRR, no murmurs, rubs, gallops RESPIRATORY:  Clear to auscultation without rales, wheezing or rhonchi  ABDOMEN: Soft, non-tender, non-distended MUSCULOSKELETAL:  No edema; No deformity  SKIN: Warm and dry NEUROLOGIC:  Alert and oriented x 3 PSYCHIATRIC:  Normal affect   ASSESSMENT:    1. Atypical chest pain   2. Primary hypertension   3. Hyperlipidemia LDL goal <70   4. Chronic obstructive pulmonary disease, unspecified COPD type ([place])    PLAN:    In order of problems listed above:  Atypical chest pain: History of coronary artery calcification.  Previous Myoview in 2019 was normal.  Given recent chest pain, I repeated Myoview which also came back negative for significant ischemia.  He continues to have intermittent chest discomfort lasting from a few seconds to a few minutes, I recommended addition of Imdur 30 mg daily given the recent reassuring Myoview.  Hypertension: Blood pressure stable  Hyperlipidemia: On pravastatin  COPD: No acute exacerbation.        Medication Adjustments/Labs and Tests Ordered: Current medicines are reviewed at length with the patient today.  Concerns regarding medicines are outlined above.  No orders of the defined types were placed in this encounter.  Meds ordered this encounter  Medications   isosorbide mononitrate (IMDUR) 30 MG 24 hr tablet    Sig: Take 1 tablet (30 mg total) by mouth daily.    Dispense:  90 tablet    Refill:  3    Patient Instructions  Medication Instructions:  START Imdur 30 mg daily. If headache continues after 1 week ou may discontinue use  *  If you need a  refill on your cardiac medications before your next appointment, please call your pharmacy*  Lab Work: NONE ordered at this time of appointment   If you have labs (blood work) drawn today and your tests are completely normal, you will receive your results only by: [place] (if you have MyChart) OR A paper copy in the mail If you have any lab test that is abnormal or we need to change your treatment, we will call you to review the results.  Testing/Procedures: NONE ordered at this time of appointment   Follow-Up: At [facility], you and your health needs are our priority.  As part of our continuing mission to provide you with exceptional heart care, we have created designated Provider Care Teams.  These Care Teams include your primary Cardiologist (physician) and Advanced Practice Providers (APPs -  Physician Assistants and Nurse Practitioners) who all work together to provide you with the care you need, when you need it.  Your next appointment:   6 week(s)  The format for your next appointment:   In Person  Provider:   Kirk Ruths, MD or APP on day Dr. Stanford Breed is in office   Other Instructions    Signed, Almyra Deforest, [place]  01/13/2021 11:24 PM    [place]

## 2021-01-11 NOTE — Patient Instructions (Signed)
Medication Instructions:  START Imdur 30 mg daily. If headache continues after 1 week ou may discontinue use  *If you need a refill on your cardiac medications before your next appointment, please call your pharmacy*  Lab Work: NONE ordered at this time of appointment   If you have labs (blood work) drawn today and your tests are completely normal, you will receive your results only by: [place] (if you have MyChart) OR A paper copy in the mail If you have any lab test that is abnormal or we need to change your treatment, we will call you to review the results.  Testing/Procedures: NONE ordered at this time of appointment   Follow-Up: At [facility], you and your health needs are our priority.  As part of our continuing mission to provide you with exceptional heart care, we have created designated Provider Care Teams.  These Care Teams include your primary Cardiologist (physician) and Advanced Practice Providers (APPs -  Physician Assistants and Nurse Practitioners) who all work together to provide you with the care you need, when you need it.  Your next appointment:   6 week(s)  The format for your next appointment:   In Person  Provider:   Kirk Ruths, MD or APP on day Dr. Stanford Breed is in office   Other Instructions

## 2021-01-12 ENCOUNTER — Encounter: Payer: Self-pay | Admitting: Family Medicine

## 2021-01-12 DIAGNOSIS — Z87891 Personal history of nicotine dependence: Secondary | ICD-10-CM

## 2021-01-12 NOTE — Telephone Encounter (Signed)
It is ok to place referral. Thanks, BJ

## 2021-01-13 ENCOUNTER — Encounter: Payer: Self-pay | Admitting: Physician Assistant

## 2021-01-22 DIAGNOSIS — H35363 Drusen (degenerative) of macula, bilateral: Secondary | ICD-10-CM | POA: Diagnosis not present

## 2021-01-30 ENCOUNTER — Other Ambulatory Visit: Payer: Self-pay | Admitting: Family Medicine

## 2021-02-01 NOTE — Telephone Encounter (Signed)
Okay for refill?    LOV 01/02/2021   Last Refill  09/21/2020   30  QTY.  2 Refills

## 2021-02-02 ENCOUNTER — Ambulatory Visit: Payer: Medicare HMO | Admitting: Podiatry

## 2021-02-02 ENCOUNTER — Other Ambulatory Visit: Payer: Self-pay

## 2021-02-02 ENCOUNTER — Encounter: Payer: Self-pay | Admitting: Podiatry

## 2021-02-02 DIAGNOSIS — B351 Tinea unguium: Secondary | ICD-10-CM

## 2021-02-02 DIAGNOSIS — L84 Corns and callosities: Secondary | ICD-10-CM | POA: Diagnosis not present

## 2021-02-02 DIAGNOSIS — L920 Granuloma annulare: Secondary | ICD-10-CM | POA: Insufficient documentation

## 2021-02-02 DIAGNOSIS — M79675 Pain in left toe(s): Secondary | ICD-10-CM | POA: Diagnosis not present

## 2021-02-02 DIAGNOSIS — G5793 Unspecified mononeuropathy of bilateral lower limbs: Secondary | ICD-10-CM

## 2021-02-02 DIAGNOSIS — M79674 Pain in right toe(s): Secondary | ICD-10-CM | POA: Diagnosis not present

## 2021-02-02 DIAGNOSIS — L820 Inflamed seborrheic keratosis: Secondary | ICD-10-CM | POA: Insufficient documentation

## 2021-02-04 ENCOUNTER — Ambulatory Visit (INDEPENDENT_AMBULATORY_CARE_PROVIDER_SITE_OTHER): Payer: Medicare HMO

## 2021-02-04 ENCOUNTER — Other Ambulatory Visit: Payer: Self-pay

## 2021-02-04 ENCOUNTER — Other Ambulatory Visit: Payer: Self-pay | Admitting: Podiatry

## 2021-02-04 ENCOUNTER — Ambulatory Visit: Payer: Medicare HMO | Admitting: Podiatry

## 2021-02-04 DIAGNOSIS — M25372 Other instability, left ankle: Secondary | ICD-10-CM

## 2021-02-04 DIAGNOSIS — S93402A Sprain of unspecified ligament of left ankle, initial encounter: Secondary | ICD-10-CM

## 2021-02-04 DIAGNOSIS — M25371 Other instability, right ankle: Secondary | ICD-10-CM

## 2021-02-04 DIAGNOSIS — L603 Nail dystrophy: Secondary | ICD-10-CM

## 2021-02-06 NOTE — Progress Notes (Signed)
Subjective: Amanda Cordova is a 60 y.o. female patient seen today for follow up of mycotic toenails b/l and lesion posterior aspect of right heel.  She has seen Dr.McDonald for right ankle instability and has completed physical therapy. Dr. Sherryle Lis also cultured her toenails. She will see him in a few weeks for follow up to discuss results as well as reassess her right ankle.  New problems reported today: None.  PCP is [name], [name], MD. Last visit was: 12/23/2020.  Allergies  Allergen Reactions   Gabapentin Anaphylaxis and Swelling    Throat and tongue swells Throat and tongue swells Other reaction(s): eyes swelled, throat, hands,   Tramadol Swelling    Throat and hands   Crestor [Rosuvastatin] Other (See Comments)   Duloxetine Nausea And Vomiting   Tramadol Hcl     Other reaction(s): nausea/dizzy/blurry vision/hands tingling   Lisinopril Nausea Only    PCP is [name], [name], MD .  Objective: Physical Exam  General: Patient is a pleasant 60 y.o. Caucasian female in NAD. AAO x 3.   Neurovascular Examination: Capillary refill time to digits immediate b/l lower extremities. Palpable DP pulse(s) b/l lower extremities Palpable PT pulse(s) b/l lower extremities Pedal hair sparse. Lower extremity skin temperature gradient within normal limits. No pain with calf compression b/l. No edema noted b/l lower extremities.  Pt has subjective symptoms of neuropathy. Protective sensation intact 5/5 intact bilaterally with 10g monofilament b/l. Vibratory sensation intact b/l.  Dermatological:  Toenails 1-5 b/l elongated, discolored, dystrophic, thickened, crumbly with subungual debris and tenderness to dorsal palpation. Hyperkeratotic lesion(s) posterolateral heel RLE. No thrombosed capillaries; no pinpoint bleeding.  No erythema, no edema, no drainage, no fluctuance.  Musculoskeletal:  Normal muscle strength 5/5 to all lower extremity muscle groups bilaterally. No gross bony  deformities b/l lower extremities.  Assessment: 1. Pain due to onychomycosis of toenails of both feet   2. Callus   3. Neuropathy of both feet    Plan: Patient was evaluated and treated and all questions answered. Consent given for treatment as described below: -Examined patient. -We discussed her right posterior heel lesion. She states she has had deep excision in the past by another podiatrist years ago. We discussed biopsy and I recommended she speak with Dr. Sherryle Lis on her next visit with him next month. -Discussed treatment options for onychomycosis. Patient opted for topical topical therapy. Patient is to apply Formula 7 Emulsion to affected toenail(s) once daily. -Toenails 1-5 b/l were debrided in length and girth with sterile nail nippers and dremel without iatrogenic bleeding.  -Callus(es) posterior aspect of heel right foot pared utilizing sterile scalpel blade without complication or incident. Total number debrided =1. -Patient to report any pedal injuries to medical professional immediately. -Patient/POA to call should there be question/concern in the interim.  Return in about 3 months (around 05/05/2021).  Marzetta Board, DPM

## 2021-02-08 NOTE — Progress Notes (Signed)
  Subjective:  Patient ID: Collene Mares, female    DOB: 1960/08/02,  MRN: [id number]  Chief Complaint  Patient presents with   ankle instability       re-check ankle instability, f/u culture results     60 y.o. female returns for follow-up with the above complaint. History confirmed with patient.  She sprained her left ankle yesterday, therapy has helped with the right side and this is doing better but still feels very uneven and feels that she is going to fall quite a bit.  Objective:  Physical Exam: warm, good capillary refill, no trophic changes or ulcerative lesions, normal DP and PT pulses, and normal sensory exam.  Dystrophic nail and porokeratosis present  bilaterally she has gross ankle instability On the left ankle today there is severe edema and ecchymosis and pain on palpation of the distal fibula lateral ankle ligament complex, not on the navicular medial ankle ligaments or fifth metatarsal base no proximal fibular pain  Radiographs: Multiple views x-ray of left ankle: no fracture, dislocation, there is soft tissue swelling Assessment:   1. Severe sprain of left ankle, initial encounter   2. Ankle instability, right   3. Nail dystrophy      Plan:  Patient was evaluated and treated and all questions answered.  She is had a quite severe sprain on the left side now.  I recommended immobilization in a short CAM boot which I dispensed today, RICE protocol reviewed and rehab exercises reviewed as well.  She will work on her home therapy that she did the right side and left side and is feeling better.  OTC NSAIDs for pain control.  I think long-term she may need something such as a Nurse, mental health for support if therapy is not helping.  We also discussed surgical stabilization but this may not be necessary at this time.  Reviewed previous culture results and she has no fungus which we discussed  No follow-ups on file.

## 2021-02-11 ENCOUNTER — Ambulatory Visit: Payer: Medicare HMO | Admitting: Podiatry

## 2021-02-18 ENCOUNTER — Other Ambulatory Visit: Payer: Medicare HMO

## 2021-02-22 DIAGNOSIS — H35363 Drusen (degenerative) of macula, bilateral: Secondary | ICD-10-CM | POA: Diagnosis not present

## 2021-02-22 NOTE — Progress Notes (Deleted)
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[facility])    CAD (coronary artery disease)    COPD (chronic obstructive pulmonary disease) (HCC)    Family history of bladder cancer    Family history of breast cancer    Family history of thyroid cancer    Hyperlipemia    Hypertension    Neuropathy    Optic neuritis    Sleep apnea    wears cpap   Past Surgical History:  Procedure Laterality Date   ABDOMINAL HYSTERECTOMY     ANTERIOR CERVICAL DECOMP/DISCECTOMY FUSION N/A 05/28/2020   Procedure: ANTERIOR CERVICAL DECOMPRESSION FUSION CERVICAL THREE-FOUR.;  Surgeon: Bethann Goo, DO;  Location: [facility] OR;  Service: Neurosurgery;  Laterality: N/A;  anterior   BREAST EXCISIONAL BIOPSY Right 2000   benign   cerical fusion  1995   CHOLECYSTECTOMY     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   HYSTERECTOMY ABDOMINAL WITH SALPINGECTOMY     MENISCUS REPAIR Left     Allergies  Allergies  Allergen Reactions   Gabapentin Anaphylaxis and Swelling    Throat and tongue swells Throat and tongue swells Other reaction(s): eyes swelled, throat, hands,   Tramadol Swelling    Throat and hands   Crestor [Rosuvastatin] Other (See Comments)   Duloxetine Nausea And Vomiting   Tramadol Hcl     Other reaction(s): nausea/dizzy/blurry vision/hands tingling   Lisinopril Nausea Only    History of Present Illness    ***  Home Medications    Prior to Admission medications   Medication Sig Start Date End Date Taking? Authorizing Provider  albuterol (VENTOLIN HFA) 108 (90 Base) MCG/ACT inhaler INHALE 2 PUFFS INTO THE LUNGS EVERY 6 HOURS AS NEEDED FOR WHEEZING OR SHORTNESS OF BREATH Patient taking differently: Inhale 2 puffs into the lungs every 6 (six)  hours as needed for wheezing or shortness of breath. 12/06/19   [place], [name], MD  ALPRAZolam Prudy Feeler) 0.25 MG tablet TAKE 1 TABLET(0.25 MG) BY MOUTH DAILY AS NEEDED FOR ANXIETY 02/01/21   [place], [name], MD  amitriptyline (ELAVIL) 25 MG tablet Take 1 tablet (25 mg total) by mouth at bedtime. 12/23/20   [place], [name], MD  amLODipine (NORVASC) 5 MG tablet TAKE 1 TABLET(5 MG) BY MOUTH DAILY 01/11/21   [place], [name], MD  aspirin EC 81 MG tablet Take 1 tablet (81 mg total) by mouth daily. RESTART in 7 days 05/29/20   Dawley, Alan Mulder, DO  clindamycin (CLEOCIN) 150 MG capsule  11/23/20   [provider]  clobetasol cream (TEMOVATE) 0.05 % Apply topically 2 (two) times daily as needed. 12/24/20   [place], [name], MD  clotrimazole-betamethasone (LOTRISONE) cream 1 application to affected area 06/13/14   [provider]  ezetimibe (ZETIA) 10 MG tablet Take 1 tablet (10 mg total) by mouth daily. 05/01/20 07/30/20  Lewayne Bunting, MD  isosorbide mononitrate (IMDUR) 30 MG 24 hr tablet Take 1 tablet (30 mg total) by mouth daily. 01/11/21   Azalee Course, PA  Multiple Vitamins-Minerals (CENTRUM SILVER PO) Take 1 tablet by mouth daily.    [provider]  pravastatin (PRAVACHOL) 10 MG tablet TAKE 1 TABLET(10 MG) BY MOUTH DAILY 12/18/20   [place], Betty  G, MD  Respiratory Therapy Supplies (CARETOUCH 2 CPAP HOSE HANGER) MISC     [provider]  Grant Ruts INHUB 250-50 MCG/ACT AEPB INHALE 1 PUFF INTO THE LUNGS TWICE DAILY 11/23/20   [name], [name], MD    Family History    Family History  Problem Relation Age of Onset   Breast cancer Sister    Diabetes Sister 3       negative genetic test (VUS in CHEK2 [id number] possibly mosaic)   COPD Mother    Heart failure Mother    Heart attack Father    Diabetes Maternal Aunt    Breast cancer Sister 41   Breast cancer Sister        dx 50s   Bladder Cancer Maternal Uncle        dx >50   Bladder Cancer Other        dx 36s (mother's first cousin)    Thyroid cancer Other        dx 53s (mother's first cousin)   She indicated that her mother is deceased. She indicated that her father is deceased. She indicated that all of her three sisters are alive. She indicated that her brother is alive. She indicated that her maternal grandmother is deceased. She indicated that her maternal grandfather is deceased. She indicated that her maternal aunt is deceased. She indicated that her maternal uncle is deceased. She indicated that the status of her other is unknown.  Social History    Social History   Socioeconomic History   Marital status: Married    Spouse name: Not on file   Number of children: 4   Years of education: 12   Highest education level: High school graduate  Occupational History   Occupation: Disabled  Tobacco Use   Smoking status: Former    Packs/day: 1.00    Years: 35.00    Pack years: 35.00    Types: Cigarettes    Quit date: 01/09/2018    Years since quitting: 3.1   Smokeless tobacco: Never  Substance and Sexual Activity   Alcohol use: Not Currently   Drug use: Never   Sexual activity: Not on file  Other Topics Concern   Not on file  Social History Narrative   Lives at home with her husband and her great-grandson.   Right-handed.   2-3 cups caffeine per day.    Social Determinants of Health   Financial Resource Strain: Low Risk    Difficulty of Paying Living Expenses: Not hard at all  Food Insecurity: No Food Insecurity   Worried About Charity fundraiser in the Last Year: Never true   [place] in the Last Year: Never true  Transportation Needs: No Transportation Needs   Lack of Transportation (Medical): No   Lack of Transportation (Non-Medical): No  Physical Activity: Sufficiently Active   Days of Exercise per Week: 7 days   Minutes of Exercise per Session: 30 min  Stress: No Stress Concern Present   Feeling of Stress : Not at all  Social Connections: Moderately Isolated   Frequency of  Communication with Friends and Family: More than three times a week   Frequency of Social Gatherings with Friends and Family: Once a week   Attends Religious Services: Never   Marine scientist or Organizations: No   Attends Archivist Meetings: Never   Marital Status: Married  Human resources officer Violence: Not At Risk   Fear of Current or Ex-Partner: No  Emotionally Abused: No   Physically Abused: No   Sexually Abused: No     Review of Systems    General:  No chills, fever, night sweats or weight changes.  Cardiovascular:  No chest pain, dyspnea on exertion, edema, orthopnea, palpitations, paroxysmal nocturnal dyspnea. Dermatological: No rash, lesions/masses Respiratory: No cough, dyspnea Urologic: No hematuria, dysuria Abdominal:   No nausea, vomiting, diarrhea, bright red blood per rectum, melena, or hematemesis Neurologic:  No visual changes, wkns, changes in mental status. All other systems reviewed and are otherwise negative except as noted above.  Physical Exam    VS:  There were no vitals taken for this visit. , BMI There is no height or weight on file to calculate BMI. GEN: Well nourished, well developed, in no acute distress. HEENT: normal. Neck: Supple, no JVD, carotid bruits, or masses. Cardiac: RRR, no murmurs, rubs, or gallops. No clubbing, cyanosis, edema.  Radials/DP/PT 2+ and equal bilaterally.  Respiratory:  Respirations regular and unlabored, clear to auscultation bilaterally. GI: Soft, nontender, nondistended, BS + x 4. MS: no deformity or atrophy. Skin: warm and dry, no rash. Neuro:  Strength and sensation are intact. Psych: Normal affect.  Accessory Clinical Findings    Recent Labs: 08/20/2020: BUN 14; Creatinine, Ser 0.70; Hemoglobin 14.9; Platelets 291.0; Potassium 4.2; Sodium 144; TSH 2.66 12/04/2020: ALT 9   Recent Lipid Panel    Component Value Date/Time   CHOL 128 12/04/2020 0904   TRIG 112 12/04/2020 0904   HDL 39 (L) 12/04/2020  0904   CHOLHDL 3.3 12/04/2020 0904   LDLCALC 68 12/04/2020 0904    ECG personally reviewed by me today- *** - No acute changes  Assessment & Plan   1.  ***   Jossie Ng. Amahd Morino NP-C    02/22/2021, 9:58 AM [place] [address] Office [phone] Fax [phone]  Notice: This dictation was prepared with Dragon dictation along with smaller phrase technology. Any transcriptional errors that result from this process are unintentional and may not be corrected upon review.  I spent***minutes examining this patient, reviewing medications, and using patient centered shared decision making involving her cardiac care.  Prior to her visit I spent greater than 20 minutes reviewing her past medical history,  medications, and prior cardiac tests.

## 2021-02-24 ENCOUNTER — Other Ambulatory Visit: Payer: Self-pay

## 2021-02-24 ENCOUNTER — Ambulatory Visit (INDEPENDENT_AMBULATORY_CARE_PROVIDER_SITE_OTHER)
Admission: RE | Admit: 2021-02-24 | Discharge: 2021-02-24 | Disposition: A | Discharge status: Home / Self Care | Payer: Medicare HMO | Source: Ambulatory Visit | Attending: Acute Care | Admitting: Acute Care

## 2021-02-24 ENCOUNTER — Ambulatory Visit: Payer: Medicare HMO | Admitting: General Practice

## 2021-02-24 DIAGNOSIS — Z87891 Personal history of nicotine dependence: Secondary | ICD-10-CM

## 2021-03-01 ENCOUNTER — Other Ambulatory Visit: Payer: Self-pay | Admitting: Acute Care

## 2021-03-01 DIAGNOSIS — Z87891 Personal history of nicotine dependence: Secondary | ICD-10-CM

## 2021-03-09 ENCOUNTER — Other Ambulatory Visit: Payer: Medicare HMO

## 2021-03-16 ENCOUNTER — Ambulatory Visit
Admission: RE | Admit: 2021-03-16 | Discharge: 2021-03-16 | Disposition: A | Discharge status: Home / Self Care | Payer: Medicare HMO | Source: Ambulatory Visit | Attending: Surgery | Admitting: Surgery

## 2021-03-16 ENCOUNTER — Other Ambulatory Visit: Payer: Self-pay

## 2021-03-16 DIAGNOSIS — D249 Benign neoplasm of unspecified breast: Secondary | ICD-10-CM

## 2021-03-16 DIAGNOSIS — N6489 Other specified disorders of breast: Secondary | ICD-10-CM | POA: Diagnosis not present

## 2021-03-16 MED ORDER — GADOBUTROL 1 MMOL/ML IV SOLN
10.0000 mL | Freq: Once | INTRAVENOUS | Status: AC | PRN
  Administered 2021-03-16: 10 mL via INTRAVENOUS

## 2021-03-17 ENCOUNTER — Other Ambulatory Visit: Payer: Self-pay | Admitting: Family Medicine

## 2021-03-17 DIAGNOSIS — I2510 Atherosclerotic heart disease of native coronary artery without angina pectoris: Secondary | ICD-10-CM

## 2021-03-22 DIAGNOSIS — Z1239 Encounter for other screening for malignant neoplasm of breast: Secondary | ICD-10-CM | POA: Diagnosis not present

## 2021-04-01 ENCOUNTER — Ambulatory Visit: Payer: Medicare HMO | Admitting: Neurology

## 2021-04-05 NOTE — Progress Notes (Signed)
Cardiology Office Note   Date:  04/09/2021   ID:  Amanda Cordova, DOB January 30, 1961, MRN [id number]  PCP:  [name], [name], MD  Cardiologist:  Dr. Stanford Breed CC: Follow Up  History of Present Illness: Amanda Cordova is a 60 y.o. female who presents for ongoing assessment and management of CAD, seen on chest CT scan, tobacco abuse COPD, hypertension, and hyperlipidemia.  The patient previously had chest CT for lung cancer screening and was noted to have aortic atherosclerosis and coronary artery calcifications.  A follow-up Myoview in November 2019 revealed an EF of 60% attenuation artifact but no evidence of ischemia.  Follow-up echocardiogram in November 2019 revealed a normal EF with moderate LVH mild TR.  The patient was on statin therapy but had been unable to tolerate Lipitor, Crestor and Pravastatin, and was unable to tolerate and therefore placed on Zetia 10 mg daily. Lipid profile on 12/04/2020 LDL 68, TC 128, HDL, 39.  Was last seen by Almyra Deforest, PA, on 11/30/2020 for complaints of intermittent chest discomfort.  A nuclear stress test was ordered which came back without evidence of ischemia or infarction with a normal EF.  She describes the pain is intermittent and sharp lasting a few seconds to 20 minutes.  They occur 1-2 times a week.  The patient was started on medical therapy with isosorbide 30 mg daily.  The patient is here for follow-up to evaluate response to medication.  She comes today with complaints of severe myalgia.  In fact she is walking with a cane due to muscle aches and pains.  She feels that mostly in her back and in her shoulders as well as her legs.  The back is really bothering her a lot and she is not as active as she wants to be.  She used to walk 3 hours a day with her dog but now is down to about an hour due to the amount of pain she is experiencing.  She did stop the statin temporarily at the request of her primary care just to see if she felt better.  And  she began to feel better concerning movement and myalgia pain and was able to exercise.  However when she followed up she was restarted on the statin at a lower dose.  This again has been causing her to have myalgia pain.    She states "I cannot live like this" and would like to stop it.  She has also gained weight which is very concerning to her.  She states she has never weighed this much in her life and despite eating right even though her exercise has diminished she continues to gain weight.  Concerning chest pain, she is not experiencing any chest pain but does have some occasional chest pressure.  That does get better also when she does not take the statin.  She usually feels it at rest and not during exercise.  She is otherwise medically compliant.  Past Medical History:  Diagnosis Date   Acoustic neuroma ([place])    CAD (coronary artery disease)    COPD (chronic obstructive pulmonary disease) (HCC)    Family history of bladder cancer    Family history of breast cancer    Family history of thyroid cancer    Hyperlipemia    Hypertension    Neuropathy    Optic neuritis    Sleep apnea    wears cpap    Past Surgical History:  Procedure Laterality Date   ABDOMINAL HYSTERECTOMY  ANTERIOR CERVICAL DECOMP/DISCECTOMY FUSION N/A 05/28/2020   Procedure: ANTERIOR CERVICAL DECOMPRESSION FUSION CERVICAL THREE-FOUR.;  Surgeon: Karsten Ro, DO;  Location: [facility];  Service: Neurosurgery;  Laterality: N/A;  anterior   BREAST EXCISIONAL BIOPSY Right 2000   benign   cerical fusion  1995   CHOLECYSTECTOMY     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   HYSTERECTOMY ABDOMINAL WITH SALPINGECTOMY     MENISCUS REPAIR Left      Current Outpatient Medications  Medication Sig Dispense Refill   albuterol (VENTOLIN HFA) 108 (90 Base) MCG/ACT inhaler INHALE 2 PUFFS INTO THE LUNGS EVERY 6 HOURS AS NEEDED FOR WHEEZING OR SHORTNESS OF BREATH (Patient taking differently: Inhale 2 puffs into the lungs  every 6 (six) hours as needed for wheezing or shortness of breath.) 54 g 1   ALPRAZolam (XANAX) 0.25 MG tablet TAKE 1 TABLET(0.25 MG) BY MOUTH DAILY AS NEEDED FOR ANXIETY 30 tablet 2   amLODipine (NORVASC) 5 MG tablet TAKE 1 TABLET(5 MG) BY MOUTH DAILY 90 tablet 2   aspirin EC 81 MG tablet Take 1 tablet (81 mg total) by mouth daily. RESTART in 7 days 30 tablet 11   clobetasol cream (TEMOVATE) 0.05 % Apply topically 2 (two) times daily as needed. 30 g 2   clotrimazole-betamethasone (LOTRISONE) cream 1 application to affected area     Respiratory Therapy Supplies (CARETOUCH 2 CPAP HOSE HANGER) MISC      WIXELA INHUB 250-50 MCG/ACT AEPB INHALE 1 PUFF INTO THE LUNGS TWICE DAILY 60 each 2   amitriptyline (ELAVIL) 25 MG tablet Take 1 tablet (25 mg total) by mouth at bedtime. (Patient not taking: Reported on 04/09/2021) 30 tablet 2   clindamycin (CLEOCIN) 150 MG capsule  (Patient not taking: Reported on 04/09/2021)     DULoxetine (CYMBALTA) 20 MG capsule Take 20 mg by mouth as needed. (Patient not taking: Reported on 04/09/2021)     ezetimibe (ZETIA) 10 MG tablet Take 1 tablet (10 mg total) by mouth daily. 90 tablet 3   isosorbide mononitrate (IMDUR) 30 MG 24 hr tablet Take 1 tablet (30 mg total) by mouth daily. (Patient not taking: Reported on 04/09/2021) 90 tablet 3   Multiple Vitamins-Minerals (CENTRUM SILVER PO) Take 1 tablet by mouth daily. (Patient not taking: Reported on 04/09/2021)     No current facility-administered medications for this visit.    Allergies:   Gabapentin, Tramadol, Crestor [rosuvastatin], Duloxetine, Tramadol hcl, and Lisinopril    Social History:  The patient  reports that she quit smoking about 3 years ago. Her smoking use included cigarettes. She has a 35.00 pack-year smoking history. She has never used smokeless tobacco. She reports that she does not currently use alcohol. She reports that she does not use drugs.   Family History:  The patient's family history includes  Bladder Cancer in her maternal uncle and another family member; Breast cancer in her sister and sister; Breast cancer (age of onset: 77) in her sister; COPD in her mother; Diabetes in her maternal aunt; Diabetes (age of onset: 86) in her sister; Heart attack in her father; Heart failure in her mother; Thyroid cancer in an other family member.    ROS: All other systems are reviewed and negative. Unless otherwise mentioned in H&P    PHYSICAL EXAM: VS:  Pulse 81    Ht 5\' 4"  (1.626 m)    Wt 250 lb 9.6 oz (113.7 kg)    SpO2 97%    BMI 43.02 kg/m  ,  BMI Body mass index is 43.02 kg/m. GEN: Well nourished, well developed, in no acute distress, obese HEENT: normal Neck: no JVD, carotid bruits, or masses Cardiac: RRR; bradycardic, no murmurs, rubs, or gallops, mild 1+ dependent edema  Respiratory:  Clear to auscultation bilaterally, normal work of breathing GI: soft, nontender, nondistended, + BS MS: no deformity or atrophy Skin: warm and dry, no rash Neuro:  Strength and sensation are intact Psych: euthymic mood, full affect   EKG:  EKG is not ordered today.    Recent Labs: 08/20/2020: BUN 14; Creatinine, Ser 0.70; Hemoglobin 14.9; Platelets 291.0; Potassium 4.2; Sodium 144; TSH 2.66 12/04/2020: ALT 9    Lipid Panel    Component Value Date/Time   CHOL 128 12/04/2020 0904   TRIG 112 12/04/2020 0904   HDL 39 (L) 12/04/2020 0904   CHOLHDL 3.3 12/04/2020 0904   LDLCALC 68 12/04/2020 0904      Wt Readings from Last 3 Encounters:  04/09/21 250 lb 9.6 oz (113.7 kg)  01/11/21 248 lb 12.8 oz (112.9 kg)  12/23/20 245 lb (111.1 kg)      Other studies Reviewed: NM Stress Test The study is normal. The study is low risk.   No ST deviation was noted.   LV perfusion is normal. There is no evidence of ischemia. There is no evidence of infarction.   Left ventricular function is normal. Nuclear stress EF: 58 %. The left ventricular ejection fraction is normal (55-65%). End diastolic cavity  size is normal.   Prior study available for comparison from 03/08/2018. Previous apical attenuation more diaphragmatic on this study compared to 2019   Normal resting and stress perfusion. No ischemia or infarction EF 58% some diaphragmatic attenuation on resting images   Echocardiogram 03/05/2018 Left ventricle: The cavity size was normal. There was moderate    concentric hypertrophy. Systolic function was normal. The    estimated ejection fraction was in the range of 55% to 60%. Wall    motion was normal; there were no regional wall motion    abnormalities. Left ventricular diastolic function parameters    were normal.  - Aortic valve: Trileaflet; normal thickness leaflets.  - Aortic root: The aortic root was normal in size.  - Right ventricle: Systolic function was normal.  - Right atrium: The atrium was normal in size.  - Tricuspid valve: There was mild regurgitation.  - Pulmonary arteries: Systolic pressure was within the normal    range.  - Inferior vena cava: The vessel was normal in size.  - Pericardium, extracardiac: There was no pericardial effusion.    ASSESSMENT AND PLAN:  1.  Hyperlipidemia: She has been on pravastatin, rosuvastatin, atorvastatin, and has not been able to tolerate due to severe myalgia pain.  She is even reduced the dose of pravastatin from 20 to 10 mg to see if she could tolerate a lower dose.  This is not been helpful to her and myalgia pain continues to the point where she is using a cane for ambulation.  She enjoys walking with her dog and exercising, but has not been able to fully do this due to her discomfort.  I have spoken with her about the lipid clinic and consideration for PCSK-9-9 inhibition with Repatha.  She is very interested in this treatment.  I will refer her to the lipid clinic for discussion, insurance preauthorization, and follow-up. She is to stop the Pravastatin and will check her labs in 6 weeks. Pharmacist will see her today and follow  up appointment will see her in 6 weeks after labs.   2.  Coronary artery disease: Myoview 2019 by history was negative for ischemia.  A CT scan of her chest for lung cancer screening did note aortic atherosclerosis and coronary artery calcifications.  She denies any significant chest pain she does have some chest pressure which can occur without exertion but is also transient.  She is able to walk at least an hour without experiencing any discomfort in her chest.  Can consider repeating her stress test or even a coronary CTA if symptoms persist after stopping statin medication and beginning PCK S9 inhibition.  3.  Hypertension: She is well controlled currently on amlodipine.  She does have some complaints of dependent edema.  I have explained to her that this medication can cause this.  She is to avoid salty foods if possible to make sure that this is not severe.  4.  Obesity: She is continued to gain weight over this last few months.  She has become less active due to myalgia pain.  She is going to see her primary care in a couple of weeks for labs which should include TSH, vitamin D, hemoglobin A1c.  Current medicines are reviewed at length with the patient today.  I have spent 30 min's  dedicated to the care of this patient on the date of this encounter to include pre-visit review of records, assessment, management and diagnostic testing,with shared decision making.  Labs/ tests ordered today include: None  Phill Myron. West Pugh, ANP, [facility]   04/09/2021 8:32 AM    [facility] [address] Office [phone] Fax [phone]  Notice: This dictation was prepared with Dragon dictation along with smaller phrase technology. Any transcriptional errors that result from this process are unintentional and may not be corrected upon review.

## 2021-04-09 ENCOUNTER — Other Ambulatory Visit: Payer: Self-pay

## 2021-04-09 ENCOUNTER — Encounter: Payer: Self-pay | Admitting: Adult Health

## 2021-04-09 ENCOUNTER — Ambulatory Visit: Payer: Medicare HMO | Admitting: Adult Health

## 2021-04-09 VITALS — BP 132/77 | HR 81 | Ht 64.0 in | Wt 250.6 lb

## 2021-04-09 DIAGNOSIS — I2510 Atherosclerotic heart disease of native coronary artery without angina pectoris: Secondary | ICD-10-CM | POA: Diagnosis not present

## 2021-04-09 DIAGNOSIS — E7800 Pure hypercholesterolemia, unspecified: Secondary | ICD-10-CM

## 2021-04-09 DIAGNOSIS — J432 Centrilobular emphysema: Secondary | ICD-10-CM | POA: Diagnosis not present

## 2021-04-09 DIAGNOSIS — I10 Essential (primary) hypertension: Secondary | ICD-10-CM

## 2021-04-09 NOTE — Patient Instructions (Addendum)
Medication Instructions:  Stop taking pravastatin.  *If you need a refill on your cardiac medications before your next appointment, please call your pharmacy*  Lab work:  Get fasting blood work (lipids and liver panel) before meeting with pharmacist in February) Get at end of January.  Follow-Up: At [facility], you and your health needs are our priority.  As part of our continuing mission to provide you with exceptional heart care, we have created designated Provider Care Teams.  These Care Teams include your primary Cardiologist (physician) and Advanced Practice Providers (APPs -  Physician Assistants and Nurse Practitioners) who all work together to provide you with the care you need, when you need it.  We recommend signing up for the patient portal called "MyChart".  Sign up information is provided on this After Visit Summary.  MyChart is used to connect with patients for Virtual Visits (Telemedicine).  Patients are able to view lab/test results, encounter notes, upcoming appointments, etc.  Non-urgent messages can be sent to your provider as well.   To learn more about what you can do with MyChart, go to [email or web address].    Your next appointment:   6 month(s)  The format for your next appointment:   In Person  Provider:   Kirk Ruths, MD   Meet with Tommy Medal Pharmacist in February.

## 2021-04-09 NOTE — Addendum Note (Signed)
Addended by: Orma Render on: 04/09/2021 09:45 AM   Modules accepted: Orders

## 2021-04-15 ENCOUNTER — Other Ambulatory Visit: Payer: Self-pay | Admitting: Cardiology

## 2021-04-20 ENCOUNTER — Telehealth: Payer: Self-pay | Admitting: Family Medicine

## 2021-04-20 ENCOUNTER — Telehealth: Payer: Self-pay

## 2021-04-20 NOTE — Progress Notes (Signed)
ACUTE VISIT Chief Complaint  Patient presents with   Cough    Symptoms started on 12/27, husband was dx with pneumonia and in the hospital with sepsis. Symptoms include throat burning, "on fire", back pain from coughing, and ear pain. No fever except for one day, 101. Vomiting due to the phlegm.    Ear Pain   Sore Throat   HPI: Amanda Cordova is a 61 y.o. female with history of chronic pain, insomnia, hyperlipidemia, OSA, COPD, hypertension, CAD,and prediabetes who is here today complaining of 7-8 days of respiratory symptoms as described above. URI  This is a new problem. The current episode started 1 to 4 weeks ago. The problem has been gradually improving. The fever has been present for Less than 1 day. Associated symptoms include abdominal pain, congestion, coughing, ear pain (Left, no hearing changes.), nausea, a plugged ear sensation, rhinorrhea, a sore throat and vomiting. Pertinent negatives include no diarrhea, dysuria, rash, sinus pain or swollen glands.  She had fever x 1d 101.0 F. "I hurt all over." Productive cough, no hemoptysis. Intermittent wheezing and SOB exacerbated by exertion.  COPD, Last time she used Albuterol inh yesterday, it helped.  She has tried cough drops. Cough is interfering with sleep.  For the past 2 nights she has had episodes of nausea and vomiting when she is in bed. She had 3 vomiting last night and once the night before. "Just mucus" content. + Post nasal drainage. Periumbilical abdominal pain intermittently for the past 2 days ago. + Bloating sensation. She has not identified exacerbating or alleviating factors.  Mid chest burning sensation up to throat, exacerbated by swallowing. Negative for stridor or dysphagia.  No changes in bowel habits. Negative for exertional CP, palpitations, edema, orthopnea, PND.  She states that since her last visit she has seen cardiologist, pravastatin was discontinued.  Pending fasting blood work,  planning on starting PCKS 9 inhibitor.  Review of Systems  Constitutional:  Positive for activity change, appetite change and fatigue.  HENT:  Positive for congestion, ear pain (Left, no hearing changes.), rhinorrhea and sore throat. Negative for sinus pain.   Eyes:  Negative for redness and visual disturbance.  Respiratory:  Positive for cough.   Gastrointestinal:  Positive for abdominal pain, nausea and vomiting. Negative for diarrhea.  Genitourinary:  Negative for decreased urine volume, dysuria and hematuria.  Skin:  Negative for rash.  Neurological:  Negative for syncope and facial asymmetry.  Hematological:  Negative for adenopathy. Does not bruise/bleed easily.  Psychiatric/Behavioral:  Positive for sleep disturbance. Negative for confusion. The patient is nervous/anxious.   Rest see pertinent positives and negatives per HPI.  Current Outpatient Medications on File Prior to Visit  Medication Sig Dispense Refill   albuterol (VENTOLIN HFA) 108 (90 Base) MCG/ACT inhaler INHALE 2 PUFFS INTO THE LUNGS EVERY 6 HOURS AS NEEDED FOR WHEEZING OR SHORTNESS OF BREATH (Patient taking differently: Inhale 2 puffs into the lungs every 6 (six) hours as needed for wheezing or shortness of breath.) 54 g 1   ALPRAZolam (XANAX) 0.25 MG tablet TAKE 1 TABLET(0.25 MG) BY MOUTH DAILY AS NEEDED FOR ANXIETY 30 tablet 2   amLODipine (NORVASC) 5 MG tablet TAKE 1 TABLET(5 MG) BY MOUTH DAILY 90 tablet 2   aspirin EC 81 MG tablet Take 1 tablet (81 mg total) by mouth daily. RESTART in 7 days 30 tablet 11   clobetasol cream (TEMOVATE) 0.05 % Apply topically 2 (two) times daily as needed. 30 g 2   clotrimazole-betamethasone (  LOTRISONE) cream 1 application to affected area     ezetimibe (ZETIA) 10 MG tablet TAKE 1 TABLET(10 MG) BY MOUTH DAILY 90 tablet 3   Respiratory Therapy Supplies (CARETOUCH 2 CPAP HOSE HANGER) MISC      WIXELA INHUB 250-50 MCG/ACT AEPB INHALE 1 PUFF INTO THE LUNGS TWICE DAILY 60 each 2    amitriptyline (ELAVIL) 25 MG tablet Take 1 tablet (25 mg total) by mouth at bedtime. (Patient not taking: Reported on 04/09/2021) 30 tablet 2   clindamycin (CLEOCIN) 150 MG capsule  (Patient not taking: Reported on 04/09/2021)     DULoxetine (CYMBALTA) 20 MG capsule Take 20 mg by mouth as needed. (Patient not taking: Reported on 04/09/2021)     isosorbide mononitrate (IMDUR) 30 MG 24 hr tablet Take 1 tablet (30 mg total) by mouth daily. (Patient not taking: Reported on 04/09/2021) 90 tablet 3   Multiple Vitamins-Minerals (CENTRUM SILVER PO) Take 1 tablet by mouth daily. (Patient not taking: Reported on 04/09/2021)     No current facility-administered medications on file prior to visit.   Past Medical History:  Diagnosis Date   Acoustic neuroma ([place])    CAD (coronary artery disease)    COPD (chronic obstructive pulmonary disease) (HCC)    Family history of bladder cancer    Family history of breast cancer    Family history of thyroid cancer    Hyperlipemia    Hypertension    Neuropathy    Optic neuritis    Sleep apnea    wears cpap   Allergies  Allergen Reactions   Gabapentin Anaphylaxis and Swelling    Throat and tongue swells Throat and tongue swells Other reaction(s): eyes swelled, throat, hands,   Tramadol Swelling    Throat and hands   Crestor [Rosuvastatin] Other (See Comments)   Duloxetine Nausea And Vomiting   Tramadol Hcl     Other reaction(s): nausea/dizzy/blurry vision/hands tingling   Lisinopril Nausea Only   Social History   Socioeconomic History   Marital status: Married    Spouse name: Not on file   Number of children: 4   Years of education: 12   Highest education level: 12th grade  Occupational History   Occupation: Disabled  Tobacco Use   Smoking status: Former    Packs/day: 1.00    Years: 35.00    Pack years: 35.00    Types: Cigarettes    Quit date: 01/09/2018    Years since quitting: 3.2   Smokeless tobacco: Never  Substance and Sexual  Activity   Alcohol use: Not Currently   Drug use: Never   Sexual activity: Not on file  Other Topics Concern   Not on file  Social History Narrative   Lives at home with her husband and her great-grandson.   Right-handed.   2-3 cups caffeine per day.    Social Determinants of Health   Financial Resource Strain: Low Risk    Difficulty of Paying Living Expenses: Not hard at all  Food Insecurity: No Food Insecurity   Worried About Charity fundraiser in the Last Year: Never true   [place] in the Last Year: Never true  Transportation Needs: No Transportation Needs   Lack of Transportation (Medical): No   Lack of Transportation (Non-Medical): No  Physical Activity: Sufficiently Active   Days of Exercise per Week: 7 days   Minutes of Exercise per Session: 60 min  Stress: No Stress Concern Present   Feeling of Stress : Not at  all  Social Connections: Unknown   Frequency of Communication with Friends and Family: More than three times a week   Frequency of Social Gatherings with Friends and Family: Twice a week   Attends Religious Services: Patient refused   Active Member of Clubs or Organizations: No   Attends Archivist Meetings: Never   Marital Status: Married   Vitals:   04/21/21 0711  BP: 130/80  Pulse: (!) 58  Resp: 16  Temp: 98.5 F (36.9 C)  SpO2: 96%   Body mass index is 41.39 kg/m.  Physical Exam Vitals and nursing note reviewed.  Constitutional:      General: She is not in acute distress.    Appearance: She is well-developed. She is not ill-appearing.  HENT:     Head: Normocephalic and atraumatic.     Right Ear: Tympanic membrane, ear canal and external ear normal.     Left Ear: External ear normal.     Ears:     Comments: Left ear canal cerumen excess, cough no see TM.    Nose: Congestion and rhinorrhea present.     Right Sinus: No maxillary sinus tenderness or frontal sinus tenderness.     Left Sinus: No maxillary sinus tenderness or  frontal sinus tenderness.     Mouth/Throat:     Pharynx: Uvula midline. Posterior oropharyngeal erythema (Mild.) present. No pharyngeal swelling, oropharyngeal exudate or uvula swelling.     Comments: Postnasal drainage. Eyes:     Conjunctiva/sclera: Conjunctivae normal.  Cardiovascular:     Rate and Rhythm: Regular rhythm. Bradycardia present.     Heart sounds: No murmur heard. Pulmonary:     Effort: Pulmonary effort is normal. No respiratory distress.     Breath sounds: Normal breath sounds. No stridor.     Comments: Prolonged expiration and coughing spells, non productive. Abdominal:     Palpations: Abdomen is soft. There is no hepatomegaly or mass.     Tenderness: There is abdominal tenderness in the periumbilical area. There is no guarding or rebound.  Musculoskeletal:     Cervical back: No edema or erythema. No muscular tenderness.  Lymphadenopathy:     Head:     Right side of head: No submandibular adenopathy.     Left side of head: No submandibular adenopathy.     Cervical: No cervical adenopathy.  Skin:    General: Skin is warm.     Findings: No erythema or rash.  Neurological:     General: No focal deficit present.     Mental Status: She is alert and oriented to person, place, and time.     Comments: Antalgic gait assisted with a walker.  Psychiatric:        Speech: Speech normal.     Comments: Well groomed, good eye contact.   ASSESSMENT AND PLAN:  [name] was seen today for cough, ear pain and sore throat.  Diagnoses and all orders for this visit: Orders Placed This Encounter  Procedures   DG Chest 2 View   Periumbilical abdominal pain History and examination do not suggest a serious process. ?  Abdominal wall pain. Pain was not reproducible during abdominal exam today. I do not think imaging needed at this time.  Instructed about warning signs.  COPD exacerbation ([place]) After verbal consent and discussion of some side effects, she agrees with DuoNeb  treatment. Cough improved with Duoneb neb treatment in the office, tolerated well. I do not think antibiotic treatment is needed at this time, further  recommendation will be given according to chest x-ray result. She has tolerated prednisone well in the past, recommend 3 to 5 days of prednisone 40 mg with breakfast. Albuterol inh 2 puff every 6 hours for a week then as needed for wheezing or shortness of breath.  Instructed about warning signs.  -     predniSONE (DELTASONE) 20 MG tablet; Take 2 tablets (40 mg total) by mouth daily with breakfast for 5 days. -     ipratropium-albuterol (DUONEB) 0.5-2.5 (3) MG/3ML nebulizer solution 3 mL  Chest discomfort Burning like sensation. Clearly instructed about warning signs. Following with cardiologist.  Gastroesophageal reflux disease, unspecified whether esophagitis present Recommend trial of PPI and GERD precautions. Nausea and vomiting at night could be GERD related.  -    Omeprazole (PRILOSEC) 40 MG capsule; Take 1 capsule (40 mg total) by mouth daily.  URI, acute Improving. Most likely viral, symptomatic treatment to continue. Because it has been > 5 days testing for flu or COVID 19 will not change management. Her husband had both negative. Instructed to monitor for new symptoms, including recurrence of fever.  -     ipratropium-albuterol (DUONEB) 0.5-2.5 (3) MG/3ML nebulizer solution 3 mL  Cough, unspecified type We discussed possible etiologies, some of her chronic medical problems could be aggravating problem. Explained that after recovering from a respiratory tract infection cough and congestion can last a few days and even weeks. Plain Mucinex may help. She has taking hydrocodone in the past with no problem, so recommend Hycodan 5 mils at bedtime mainly and benzonatate during the day. Adequate hydration. Clearly instructed about warning signs. Further recommendation will be given according to chest x-ray results.  -      HYDROcodone bit-homatropine (HYCODAN) 5-1.5 MG/5ML syrup; Take 5 mLs by mouth every 12 (twelve) hours as needed for up to 10 days for cough. -     benzonatate (TESSALON) 100 MG capsule; Take 2 capsules (200 mg total) by mouth 2 (two) times daily as needed for up to 10 days. -     ipratropium-albuterol (DUONEB) 0.5-2.5 (3) MG/3ML nebulizer solution 3 mL  I spent a total of 43 minutes in both face to face and non face to face activities for this visit on the date of this encounter. During this time history was obtained and documented, examination was performed,and assessment/plan discussed.  Return in about 10 days (around 05/01/2021).  Shakemia Madera G. [name], MD  [facility]. [place] office.

## 2021-04-20 NOTE — Telephone Encounter (Signed)
--  Caller states she has an appt on 04/26/2021 and wants to know if she can be seen today. Caller states she is coughing , sore throat, chest hurts from coughing and earache. temp of 100.1  04/19/2021 7:53:24 AM Go to ED Now Radford Pax, RN, [facility] - ED

## 2021-04-20 NOTE — Telephone Encounter (Signed)
Patient calling in with respiratory symptoms: Shortness of breath, chest pain, palpitations or other red words send to Triage  Does the patient have a fever over 100, cough, congestion, sore throat, runny nose, lost of taste/smell (please list symptoms that patient has)? Chest hurting from cough, sore throat and ear pain What date did symptoms start?04-13-2021 (If over 5 days ago, pt may be scheduled for in person visit)  Have you tested for Covid in the last 5 days? No   If yes, was it positive []  OR negative [] ? If positive in the last 5 days, please schedule virtual visit now. If negative, schedule for an in person OV with the next available provider if PCP has no openings. Please also let patient know they will be tested again (follow the script below)  "you will have to arrive 69mins prior to your appt time to be Covid tested. Please park in back of office at the cone & call [phone] to let the staff know you have arrived. A staff member will meet you at your car to do a rapid covid test. Once the test has resulted you will be notified by phone of your results to determine if appt will remain an in person visit or be converted to a virtual/phone visit. If you arrive less than 45mins before your appt time, your visit will be automatically converted to virtual & any recommended testing will happen AFTER the visit." Pt has an appt with dr [name] on 04-21-2021  THINGS TO REMEMBER  If no availability for virtual visit in office,  please schedule another [place] office  If no availability at another [place] office, please instruct patient that they can schedule an evisit or virtual visit through their mychart account. Visits up to 8pm  patients can be seen in office 5 days after positive COVID test

## 2021-04-21 ENCOUNTER — Encounter: Payer: Self-pay | Admitting: Family Medicine

## 2021-04-21 ENCOUNTER — Other Ambulatory Visit: Payer: Self-pay | Admitting: Family Medicine

## 2021-04-21 ENCOUNTER — Other Ambulatory Visit: Payer: Self-pay

## 2021-04-21 ENCOUNTER — Ambulatory Visit (INDEPENDENT_AMBULATORY_CARE_PROVIDER_SITE_OTHER): Payer: Medicare HMO | Admitting: Family Medicine

## 2021-04-21 ENCOUNTER — Ambulatory Visit (INDEPENDENT_AMBULATORY_CARE_PROVIDER_SITE_OTHER): Payer: Medicare HMO

## 2021-04-21 VITALS — BP 130/80 | HR 58 | Temp 98.5°F | Resp 16 | Ht 64.0 in | Wt 241.1 lb

## 2021-04-21 DIAGNOSIS — J069 Acute upper respiratory infection, unspecified: Secondary | ICD-10-CM

## 2021-04-21 DIAGNOSIS — R1033 Periumbilical pain: Secondary | ICD-10-CM

## 2021-04-21 DIAGNOSIS — R0789 Other chest pain: Secondary | ICD-10-CM

## 2021-04-21 DIAGNOSIS — R059 Cough, unspecified: Secondary | ICD-10-CM

## 2021-04-21 DIAGNOSIS — R509 Fever, unspecified: Secondary | ICD-10-CM | POA: Diagnosis not present

## 2021-04-21 DIAGNOSIS — J441 Chronic obstructive pulmonary disease with (acute) exacerbation: Secondary | ICD-10-CM

## 2021-04-21 DIAGNOSIS — R062 Wheezing: Secondary | ICD-10-CM | POA: Diagnosis not present

## 2021-04-21 DIAGNOSIS — K219 Gastro-esophageal reflux disease without esophagitis: Secondary | ICD-10-CM | POA: Diagnosis not present

## 2021-04-21 MED ORDER — HYDROCODONE BIT-HOMATROP MBR 5-1.5 MG/5ML PO SOLN: 5.0000 mL | Freq: Two times a day (BID) | ORAL | 0 refills | Status: DC | PRN

## 2021-04-21 MED ORDER — OMEPRAZOLE 40 MG PO CPDR: 40.0000 mg | DELAYED_RELEASE_CAPSULE | Freq: Every day | ORAL | 0 refills | Status: DC

## 2021-04-21 MED ORDER — IPRATROPIUM-ALBUTEROL 0.5-2.5 (3) MG/3ML IN SOLN
3.0000 mL | Freq: Once | RESPIRATORY_TRACT | Status: AC
  Administered 2021-04-21: 3 mL via RESPIRATORY_TRACT

## 2021-04-21 MED ORDER — PREDNISONE 20 MG PO TABS: 40.0000 mg | ORAL_TABLET | Freq: Every day | ORAL | 0 refills | Status: AC

## 2021-04-21 MED ORDER — BENZONATATE 100 MG PO CAPS: 200.0000 mg | ORAL_CAPSULE | Freq: Two times a day (BID) | ORAL | 0 refills | Status: DC | PRN

## 2021-04-21 NOTE — Patient Instructions (Addendum)
A few things to remember from today's visit:  COPD exacerbation ([place]) - Plan: predniSONE (DELTASONE) 20 MG tablet  URI, acute  Cough, unspecified type - Plan: DG Chest 2 View, HYDROcodone bit-homatropine (HYCODAN) 5-1.5 MG/5ML syrup, benzonatate (TESSALON) 100 MG capsule  Chest discomfort  If you need refills please call your pharmacy. Do not use My Chart to request refills or for acute issues that need immediate attention.   Lungs are clear today. Albuterol inh 2 puff every 6 hours for a week then as needed for wheezing or shortness of breath.  Prednisone with breakfast for 3-5 days. Omeprazole for 3-4 weeks may help with chest burning sensation.  I do not think antibiotic will help at this time.  Please be sure medication list is accurate. If a new problem present, please set up appointment sooner than planned today.

## 2021-04-23 ENCOUNTER — Encounter: Payer: Self-pay | Admitting: Family Medicine

## 2021-04-26 ENCOUNTER — Ambulatory Visit: Payer: Medicare HMO | Admitting: Family Medicine

## 2021-04-27 ENCOUNTER — Other Ambulatory Visit: Payer: Self-pay | Admitting: Family Medicine

## 2021-04-27 DIAGNOSIS — J432 Centrilobular emphysema: Secondary | ICD-10-CM

## 2021-04-29 NOTE — Progress Notes (Signed)
HPI: Amanda Cordova is a 61 y.o. female with hx of chronic pain,vit D def,OSA,and peripheral neuropathy here today to follow on recent visit. She was last seen on 04/21/21, when she had a few complaints.  Nausea and vomiting have resolved. Abdominal pain has improved. She is on Omeprazole 40 mg daily. Still having problems with swallowing solids, she has to "cough food back up." Throat burning improved but not resolved.  COPD: Negative for fever. Hycodan was not filled, her pharmacy did not have it. Benzonatate did not help much with cough. Still having SOB,. SOB exacerbated by exertion and when in bed at night. + Wheezing. Respiratory symptoms have improved. Albuterol inh helps with SOB and wheezing. She used her husband's Albuterol neb. She is on Wixela 250-50 mcg 1 puff bid. Negative for CP,palpitations,and diaphoresis.  Review of Systems  Constitutional:  Positive for fatigue. Negative for unexpected weight change.  Gastrointestinal:  Negative for blood in stool.       Negative for changes in bowel habits.  Genitourinary:  Negative for decreased urine volume, dysuria and hematuria.  Musculoskeletal:  Positive for arthralgias, back pain and gait problem.  Skin:  Negative for pallor and rash.  Neurological:  Negative for syncope and weakness.  Rest see pertinent positives and negatives per HPI.  Current Outpatient Medications on File Prior to Visit  Medication Sig Dispense Refill   ALPRAZolam (XANAX) 0.25 MG tablet TAKE 1 TABLET(0.25 MG) BY MOUTH DAILY AS NEEDED FOR ANXIETY 30 tablet 2   amLODipine (NORVASC) 5 MG tablet TAKE 1 TABLET(5 MG) BY MOUTH DAILY 90 tablet 2   aspirin EC 81 MG tablet Take 1 tablet (81 mg total) by mouth daily. RESTART in 7 days 30 tablet 11   clindamycin (CLEOCIN) 150 MG capsule      clobetasol cream (TEMOVATE) 0.05 % Apply topically 2 (two) times daily as needed. 30 g 2   clotrimazole-betamethasone (LOTRISONE) cream 1 application to  affected area     ezetimibe (ZETIA) 10 MG tablet TAKE 1 TABLET(10 MG) BY MOUTH DAILY 90 tablet 3   omeprazole (PRILOSEC) 40 MG capsule TAKE 1 CAPSULE(40 MG) BY MOUTH DAILY 90 capsule 0   Respiratory Therapy Supplies (CARETOUCH 2 CPAP HOSE HANGER) MISC      WIXELA INHUB 250-50 MCG/ACT AEPB INHALE 1 PUFF INTO THE LUNGS TWICE DAILY 60 each 2   No current facility-administered medications on file prior to visit.   Past Medical History:  Diagnosis Date   Acoustic neuroma ([place])    CAD (coronary artery disease)    COPD (chronic obstructive pulmonary disease) (HCC)    Family history of bladder cancer    Family history of breast cancer    Family history of thyroid cancer    Hyperlipemia    Hypertension    Neuropathy    Optic neuritis    Sleep apnea    wears cpap   Allergies  Allergen Reactions   Gabapentin Anaphylaxis and Swelling    Throat and tongue swells Throat and tongue swells Other reaction(s): eyes swelled, throat, hands,   Tramadol Swelling    Throat and hands   Crestor [Rosuvastatin] Other (See Comments)   Duloxetine Nausea And Vomiting   Tramadol Hcl     Other reaction(s): nausea/dizzy/blurry vision/hands tingling   Lisinopril Nausea Only    Social History   Socioeconomic History   Marital status: Married    Spouse name: Not on file   Number of children: 4   Years of education: 45  Highest education level: 12th grade  Occupational History   Occupation: Disabled  Tobacco Use   Smoking status: Former    Packs/day: 1.00    Years: 35.00    Pack years: 35.00    Types: Cigarettes    Quit date: 01/09/2018    Years since quitting: 3.3   Smokeless tobacco: Never  Substance and Sexual Activity   Alcohol use: Not Currently   Drug use: Never   Sexual activity: Not on file  Other Topics Concern   Not on file  Social History Narrative   Lives at home with her husband and her great-grandson.   Right-handed.   2-3 cups caffeine per day.    Social Determinants of  Health   Financial Resource Strain: Low Risk    Difficulty of Paying Living Expenses: Not hard at all  Food Insecurity: No Food Insecurity   Worried About Charity fundraiser in the Last Year: Never true   [place] in the Last Year: Never true  Transportation Needs: No Transportation Needs   Lack of Transportation (Medical): No   Lack of Transportation (Non-Medical): No  Physical Activity: Sufficiently Active   Days of Exercise per Week: 7 days   Minutes of Exercise per Session: 60 min  Stress: No Stress Concern Present   Feeling of Stress : Not at all  Social Connections: Unknown   Frequency of Communication with Friends and Family: More than three times a week   Frequency of Social Gatherings with Friends and Family: Twice a week   Attends Religious Services: Patient refused   Marine scientist or Organizations: No   Attends Archivist Meetings: Never   Marital Status: Married    Vitals:   04/30/21 0715  BP: 130/80  Pulse: 76  Resp: 16  SpO2: 97%   Body mass index is 42.08 kg/m.  Physical Exam Vitals and nursing note reviewed.  Constitutional:      General: She is not in acute distress.    Appearance: She is well-developed.  HENT:     Head: Normocephalic and atraumatic.     Mouth/Throat:     Mouth: Mucous membranes are moist.     Pharynx: Oropharynx is clear.  Eyes:     Conjunctiva/sclera: Conjunctivae normal.  Cardiovascular:     Rate and Rhythm: Normal rate and regular rhythm.     Heart sounds: No murmur heard. Pulmonary:     Effort: Pulmonary effort is normal. No respiratory distress.     Breath sounds: Normal breath sounds.  Abdominal:     Palpations: Abdomen is soft. There is no mass.     Tenderness: There is no abdominal tenderness.  Lymphadenopathy:     Cervical: No cervical adenopathy.  Skin:    General: Skin is warm.     Findings: No erythema or rash.  Neurological:     General: No focal deficit present.     Mental Status:  She is alert and oriented to person, place, and time.     Cranial Nerves: No cranial nerve deficit.     Comments: Antalgic gait assisted by a cane.   ASSESSMENT AND PLAN:  Ms.Kanna was seen today for follow-up.  Diagnoses and all orders for this visit: Orders Placed This Encounter  Procedures   Ambulatory referral to Gastroenterology   Dysphagia, unspecified type Still having dysphagia despite of being on PPI. Still this could be caused by GERD and may need more time on PPI.  GI referral  placed. Continue Omeprazole 40 mg daily and GERD precautions.  Centrilobular emphysema (HCC) Lung auscultation negative. Continue Albuterol inh 2 puff q 4-6 hours prn. Continue Wixela 250-50 mcg 1 puff bid.  We could consider pulmonology referral if symptoms do not resolved. Lung cancer screening with low dose chest CT done last in 02/2021 was Bi-Rads 2, annual follow up was recommended.  -     albuterol (VENTOLIN HFA) 108 (90 Base) MCG/ACT inhaler; Inhale 2 puffs into the lungs every 6 (six) hours as needed for wheezing or shortness of breath.  Cough, unspecified type Continue Benzonatate 100 mg 1-2 caps daily prn. Explained that cough can last a few more days and even weeks or months given her hx of COPD. GERD can also be a contributing factor. Adequate hydration. I do not think we need to repeat CXR. 04/21/21 CXR was negative for cardiopulmonary disease.  Return in about 6 weeks (around 06/11/2021) for Keep next appt..  Shameika Speelman G. [name], MD  [facility]. [place] office.

## 2021-04-30 ENCOUNTER — Ambulatory Visit (INDEPENDENT_AMBULATORY_CARE_PROVIDER_SITE_OTHER): Payer: Medicare HMO | Admitting: Family Medicine

## 2021-04-30 ENCOUNTER — Encounter: Payer: Self-pay | Admitting: Gastroenterology

## 2021-04-30 ENCOUNTER — Encounter: Payer: Self-pay | Admitting: Family Medicine

## 2021-04-30 VITALS — BP 130/80 | HR 76 | Resp 16 | Ht 64.0 in | Wt 245.1 lb

## 2021-04-30 DIAGNOSIS — R059 Cough, unspecified: Secondary | ICD-10-CM | POA: Diagnosis not present

## 2021-04-30 DIAGNOSIS — J432 Centrilobular emphysema: Secondary | ICD-10-CM | POA: Diagnosis not present

## 2021-04-30 DIAGNOSIS — R1310 Dysphagia, unspecified: Secondary | ICD-10-CM | POA: Diagnosis not present

## 2021-04-30 MED ORDER — ALBUTEROL SULFATE HFA 108 (90 BASE) MCG/ACT IN AERS: 2.0000 | INHALATION_SPRAY | Freq: Four times a day (QID) | RESPIRATORY_TRACT | 1 refills | Status: DC | PRN

## 2021-04-30 NOTE — Patient Instructions (Addendum)
A few things to remember from today's visit:  Dysphagia, unspecified type - Plan: Ambulatory referral to Gastroenterology  Centrilobular emphysema ([place]) - Plan: albuterol (VENTOLIN HFA) 108 (90 Base) MCG/ACT inhaler  Cough, unspecified type  If you need refills please call your pharmacy. Do not use My Chart to request refills or for acute issues that need immediate attention.   Continue Wixela inhaler 2 times daily and Albuterol inh as needed. Cough may last a few more days. Continue Omeprazole and Benzonatate. Gastro appt will be arranged. Monitor for fever.  Please be sure medication list is accurate. If a new problem present, please set up appointment sooner than planned today.

## 2021-05-06 ENCOUNTER — Other Ambulatory Visit: Payer: Self-pay | Admitting: Family Medicine

## 2021-05-07 NOTE — Telephone Encounter (Signed)
-   last filled 04/06/21

## 2021-05-10 DIAGNOSIS — E7800 Pure hypercholesterolemia, unspecified: Secondary | ICD-10-CM | POA: Diagnosis not present

## 2021-05-11 LAB — LIPID PANEL
Chol/HDL Ratio: 3.6 ratio (ref 0.0–4.4)
Cholesterol, Total: 142 mg/dL (ref 100–199)
HDL: 39 mg/dL — ABNORMAL LOW (ref >39)
LDL Chol Calc (NIH): 84 mg/dL (ref 0–99)
Triglycerides: 105 mg/dL (ref 0–149)
VLDL Cholesterol Cal: 19 mg/dL (ref 5–40)

## 2021-05-11 LAB — HEPATIC FUNCTION PANEL
ALT: 13 IU/L (ref 0–32)
AST: 23 IU/L (ref 0–40)
Albumin: 4.2 g/dL (ref 3.8–4.8)
Alkaline Phosphatase: 95 IU/L (ref 44–121)
Bilirubin Total: 0.7 mg/dL (ref 0.0–1.2)
Bilirubin, Direct: 0.2 mg/dL (ref 0.00–0.40)
Total Protein: 6.7 g/dL (ref 6.0–8.5)

## 2021-05-12 ENCOUNTER — Ambulatory Visit (INDEPENDENT_AMBULATORY_CARE_PROVIDER_SITE_OTHER): Payer: Medicare HMO | Admitting: Podiatry

## 2021-05-12 ENCOUNTER — Other Ambulatory Visit: Payer: Self-pay

## 2021-05-12 ENCOUNTER — Encounter: Payer: Self-pay | Admitting: Podiatry

## 2021-05-12 DIAGNOSIS — L603 Nail dystrophy: Secondary | ICD-10-CM

## 2021-05-14 ENCOUNTER — Telehealth: Payer: Self-pay

## 2021-05-14 NOTE — Telephone Encounter (Addendum)
Called patient regarding medication questions. Per K. Lawrence patient advised to continue current medication no additional cholesterol medication will be added.----- Message from Lendon Colonel, NP sent at 05/11/2021  4:29 PM EST ----- I have reviewed her labs. Look great! Continue with her medical therapy without changes.   KL

## 2021-05-17 ENCOUNTER — Other Ambulatory Visit: Payer: Self-pay

## 2021-05-17 DIAGNOSIS — J432 Centrilobular emphysema: Secondary | ICD-10-CM

## 2021-05-17 MED ORDER — FLUTICASONE-SALMETEROL 250-50 MCG/ACT IN AEPB: INHALATION_SPRAY | RESPIRATORY_TRACT | 2 refills | Status: DC

## 2021-05-18 NOTE — Progress Notes (Signed)
°  Subjective:  Patient ID: Amanda Cordova, female    DOB: 09/26/1960,  MRN: [id number]  61 y.o. female presents follow up dystrophic toenails b/l great toes.  Patient states she did see Dr. Sherryle Lis for an ankle sprain.They discussed biopsying the lesion of the right heel and Dr. Sherryle Lis did not feel this was necessary at the present time. Will monitor it for now and she files it down.  She states she will be going on vacation and would like to know if she can polish her toenails.  New problem(s): None   PCP is [name], [name], MD , and last visit was 04/30/2021.  Allergies  Allergen Reactions   Gabapentin Anaphylaxis and Swelling    Throat and tongue swells Throat and tongue swells Other reaction(s): eyes swelled, throat, hands,   Tramadol Swelling    Throat and hands   Crestor [Rosuvastatin] Other (See Comments)   Duloxetine Nausea And Vomiting   Tramadol Hcl     Other reaction(s): nausea/dizzy/blurry vision/hands tingling   Lisinopril Nausea Only    Review of Systems: Negative except as noted in the HPI.   Objective:  Vascular Examination: CFT immediate b/l LE. Palpable DP/PT pulses b/l LE. Digital hair sparse b/l. Skin temperature gradient WNL b/l. No pain with calf compression b/l. No edema noted b/l. No cyanosis or clubbing noted b/l LE.  Neurological Examination: Pt has subjective symptoms of neuropathy. Protective sensation intact 5/5 intact bilaterally with 10g monofilament b/l.  Dermatological Examination: Pedal integument with normal turgor, texture and tone BLE. No open wounds b/l LE. No interdigital macerations noted b/l LE. Toenails bilateral great toes elongated, discolored, dystrophic, thickened, and crumbly with subungual debris and tenderness to dorsal palpation. Hyperkeratotic lesion(s) posterolateral aspect of right heel.  No erythema, no edema, no drainage, no fluctuance.  Musculoskeletal Examination: Muscle strength 5/5 to b/l LE. Muscle strength  5/5 to all LE muscle groups of b/l lower extremities. No gross bony deformities bilaterally.  Radiographs: None  Last A1c:   Hemoglobin A1C Latest Ref Rng & Units 12/23/2020  HGBA1C 4.0 - 5.6 % 5.6  Some recent data might be hidden   Assessment:   1. Nail dystrophy    Plan:  -She may polish her toenails when she goes on vacation. She continues to file the callus on her right heel. -Dystrophictoenails bilateral great toes were debrided in length and girth with sterile nail nippers and dremel without iatrogenic bleeding. -Patient/POA to call should there be question/concern in the interim.  Return in about 3 months (around 08/10/2021).  Marzetta Board, DPM

## 2021-05-19 ENCOUNTER — Encounter: Payer: Self-pay | Admitting: Gastroenterology

## 2021-05-19 ENCOUNTER — Ambulatory Visit (INDEPENDENT_AMBULATORY_CARE_PROVIDER_SITE_OTHER): Payer: Medicare HMO | Admitting: Gastroenterology

## 2021-05-19 VITALS — BP 120/72 | HR 66 | Ht 64.0 in | Wt 249.0 lb

## 2021-05-19 DIAGNOSIS — R490 Dysphonia: Secondary | ICD-10-CM

## 2021-05-19 DIAGNOSIS — Z1211 Encounter for screening for malignant neoplasm of colon: Secondary | ICD-10-CM | POA: Diagnosis not present

## 2021-05-19 DIAGNOSIS — R1310 Dysphagia, unspecified: Secondary | ICD-10-CM

## 2021-05-19 DIAGNOSIS — Z1212 Encounter for screening for malignant neoplasm of rectum: Secondary | ICD-10-CM

## 2021-05-19 MED ORDER — NA SULFATE-K SULFATE-MG SULF 17.5-3.13-1.6 GM/177ML PO SOLN: 1.0000 | Freq: Once | ORAL | 0 refills | Status: AC

## 2021-05-19 NOTE — Patient Instructions (Signed)
If you are age 63 or older, your body mass index should be between 23-30. Your Body mass index is 42.74 kg/m. If this is out of the aforementioned range listed, please consider follow up with your Primary Care Provider.  If you are age 51 or younger, your body mass index should be between 19-25. Your Body mass index is 42.74 kg/m. If this is out of the aformentioned range listed, please consider follow up with your Primary Care Provider.   You have been scheduled for an endoscopy and colonoscopy. Please follow the written instructions given to you at your visit today. Please pick up your prep supplies at the pharmacy within the next 1-3 days. If you use inhalers (even only as needed), please bring them with you on the day of your procedure.   The [facility] providers would like to encourage you to use [facility] to communicate with providers for non-urgent requests or questions.  Due to long hold times on the telephone, sending your provider a message by [facility] may be a faster and more efficient way to get a response.  Please allow 48 business hours for a response.  Please remember that this is for non-urgent requests.   It was a pleasure to see you today!  Thank you for trusting me with your gastrointestinal care!    Scott E.Candis Schatz, MD

## 2021-05-19 NOTE — Progress Notes (Signed)
HPI : Amanda Cordova is a very pleasant 61 year old female with a history of COPD, hypertension and sleep apnea who is referred to [place] by Dr. Betty [name] for further evaluation of dysphagia.  Patient states that she has had dysphagia for several years, but it has worsened recently.  She describes her dysphagia as a sensation of food getting stuck at the top of her throat, frequently requiring her to cough it back up or to drink more liquids to get it to go down.  She denies the sensation of food sitting in her chest and has never had to forcefully vomit back up food that feels stuck.  She has this dysphagia with pretty much any food, including things like mashed potatoes.  It is not noticed more with meats or breads.  She denies sensation of "food going down the wrong way".  She denies any typical GERD symptoms such as heartburn or acid regurgitation.  She does however, have chronic hoarseness and reports symptoms of excessive phlegm and mucus, and throat clearing.  Sometimes she does have a burning sensation in her throat at night, but this is rare.  She denies symptoms of epigastric pain.  She started taking Prilosec about a month ago because of nausea and throat irritation in the setting of a prolonged viral illness.  She does not feel like the Prilosec has helped with any of the symptoms. She denies any chronic lower GI symptoms such as chronic diarrhea, lower abdominal pain or hematochezia.  She states that she had a screening colonoscopy around the age of 29 in [place].  This was normal per the patient.  She has no family history of colon cancer.  She has COPD but denies significant dyspnea.  She is able to go up a flight of stairs without stopping.  She has no known heart problems.  No history of anesthesia complications.    Past Medical History:  Diagnosis Date   Acoustic neuroma ([place])    CAD (coronary artery disease)    COPD (chronic obstructive pulmonary disease) (HCC)    Family history of  bladder cancer    Family history of breast cancer    Family history of thyroid cancer    Hyperlipemia    Hypertension    Neuropathy    Optic neuritis    Sleep apnea    wears cpap     Past Surgical History:  Procedure Laterality Date   ABDOMINAL HYSTERECTOMY     ANTERIOR CERVICAL DECOMP/DISCECTOMY FUSION N/A 05/28/2020   Procedure: ANTERIOR CERVICAL DECOMPRESSION FUSION CERVICAL THREE-FOUR.;  Surgeon: Karsten Ro, DO;  Location: [place];  Service: Neurosurgery;  Laterality: N/A;  anterior   BREAST EXCISIONAL BIOPSY Right 2000   benign   cerical fusion  1995   CHOLECYSTECTOMY     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   HYSTERECTOMY ABDOMINAL WITH SALPINGECTOMY     MENISCUS REPAIR Left    Family History  Problem Relation Age of Onset   Breast cancer Sister    Diabetes Sister 51       negative genetic test (VUS in CHEK2 c.[id number] possibly mosaic)   COPD Mother    Heart failure Mother    Heart attack Father    Diabetes Maternal Aunt    Breast cancer Sister 67   Breast cancer Sister        dx 39s   Bladder Cancer Maternal Uncle        dx >50   Bladder Cancer Other  dx 63s (mother's first cousin)   Thyroid cancer Other        dx 21s (mother's first cousin)   Social History   Tobacco Use   Smoking status: Former    Packs/day: 1.00    Years: 35.00    Pack years: 35.00    Types: Cigarettes    Quit date: 01/09/2018    Years since quitting: 3.3   Smokeless tobacco: Never  Substance Use Topics   Alcohol use: Not Currently   Drug use: Never   Current Outpatient Medications  Medication Sig Dispense Refill   albuterol (VENTOLIN HFA) 108 (90 Base) MCG/ACT inhaler Inhale 2 puffs into the lungs every 6 (six) hours as needed for wheezing or shortness of breath. 54 g 1   ALPRAZolam (XANAX) 0.25 MG tablet TAKE 1 TABLET(0.25 MG) BY MOUTH DAILY AS NEEDED FOR ANXIETY 30 tablet 3   amLODipine (NORVASC) 5 MG tablet TAKE 1 TABLET(5 MG) BY MOUTH DAILY 90 tablet 2    aspirin EC 81 MG tablet Take 1 tablet (81 mg total) by mouth daily. RESTART in 7 days 30 tablet 11   clindamycin (CLEOCIN) 150 MG capsule  (Patient not taking: Reported on 05/12/2021)     clobetasol cream (TEMOVATE) 0.05 % Apply topically 2 (two) times daily as needed. 30 g 2   clotrimazole-betamethasone (LOTRISONE) cream 1 application to affected area     ezetimibe (ZETIA) 10 MG tablet TAKE 1 TABLET(10 MG) BY MOUTH DAILY 90 tablet 3   fluticasone-salmeterol (WIXELA INHUB) 250-50 MCG/ACT AEPB INHALE 1 PUFF INTO THE LUNGS TWICE DAILY 180 each 2   omeprazole (PRILOSEC) 40 MG capsule TAKE 1 CAPSULE(40 MG) BY MOUTH DAILY 90 capsule 0   Respiratory Therapy Supplies (CARETOUCH 2 CPAP HOSE HANGER) MISC      No current facility-administered medications for this visit.   Allergies  Allergen Reactions   Gabapentin Anaphylaxis and Swelling    Throat and tongue swells Throat and tongue swells Other reaction(s): eyes swelled, throat, hands,   Tramadol Swelling    Throat and hands   Crestor [Rosuvastatin] Other (See Comments)   Duloxetine Nausea And Vomiting   Tramadol Hcl     Other reaction(s): nausea/dizzy/blurry vision/hands tingling   Lisinopril Nausea Only     Review of Systems: All systems reviewed and negative except where noted in HPI.    DG Chest 2 View  Result Date: 04/21/2021 CLINICAL DATA:  Cough, wheezing, fever EXAM: CHEST - 2 VIEW COMPARISON:  Prior CT scan of the chest 02/24/2021 FINDINGS: The lungs are clear and negative for focal airspace consolidation, pulmonary edema or suspicious pulmonary nodule. No pleural effusion or pneumothorax. Cardiac and mediastinal contours are within normal limits. No acute fracture or lytic or blastic osseous lesions. The visualized upper abdominal bowel gas pattern is unremarkable. IMPRESSION: No active cardiopulmonary disease. Electronically Signed   By: Jacqulynn Cadet M.D.   On: 04/21/2021 08:34    Physical Exam: There were no vitals taken  for this visit. Constitutional: Pleasant,well-developed, Caucasian female in no acute distress. HEENT: Normocephalic and atraumatic. Conjunctivae are normal. No scleral icterus. Neck supple.  Cardiovascular: Normal rate, regular rhythm.  Pulmonary/chest: Effort normal and breath sounds normal. No wheezing, rales or rhonchi. Abdominal: Soft, nondistended, nontender. Bowel sounds active throughout. There are no masses palpable. No hepatomegaly. Extremities: no edema Lymphadenopathy: No cervical adenopathy noted. Neurological: Alert and oriented to person place and time. Skin: Skin is warm and dry. No rashes noted. Psychiatric: Normal mood and affect. Behavior is  normal.  CBC    Component Value Date/Time   WBC 8.4 08/20/2020 0849   RBC 4.85 08/20/2020 0849   HGB 14.9 08/20/2020 0849   HCT 43.8 08/20/2020 0849   PLT 291.0 08/20/2020 0849   MCV 90.3 08/20/2020 0849   MCH 29.6 05/28/2020 1336   MCHC 33.9 08/20/2020 0849   RDW 14.0 08/20/2020 0849   LYMPHSABS 1.9 05/25/2020 0842   MONOABS 0.5 05/25/2020 0842   EOSABS 0.2 05/25/2020 0842   BASOSABS 0.1 05/25/2020 0842    CMP     Component Value Date/Time   NA 144 08/20/2020 0849   NA 148 (H) 07/01/2019 0844   K 4.2 08/20/2020 0849   CL 107 08/20/2020 0849   CO2 28 08/20/2020 0849   GLUCOSE 122 (H) 08/20/2020 0849   BUN 14 08/20/2020 0849   BUN 13 07/01/2019 0844   CREATININE 0.70 08/20/2020 0849   CREATININE 0.73 11/13/2019 0939   CALCIUM 9.6 08/20/2020 0849   PROT 6.7 05/10/2021 0814   ALBUMIN 4.2 05/10/2021 0814   AST 23 05/10/2021 0814   ALT 13 05/10/2021 0814   ALKPHOS 95 05/10/2021 0814   BILITOT 0.7 05/10/2021 0814   GFRNONAA >60 05/28/2020 1336   GFRNONAA 90 11/13/2019 0939   GFRAA 104 11/13/2019 0939     ASSESSMENT AND PLAN: 61 year old female with chronic dysphagia symptoms which do not seem very consistent with esophageal dysphagia.  The sensation of food getting stuck and being able to cough it back up  suggest that the food may be getting stuck in the pharynx, rather than the esophagus.  She does have some symptoms suggestive of atypical reflux such as hoarseness, throat irritation and phlegm production.  The symptoms have not improved with PPI use.  She has not been evaluated by ENT.  I suggested we perform an upper endoscopy to assess for evidence of reflux as well as any esophageal stenoses or strictures.  If unremarkable, would recommend she be evaluated by ENT.  Given the lack of response to Prilosec, recommend she stop taking this.  If she has evidence of esophagitis or reflux on EGD, we can try another PPI.  She is also due for her screening colonoscopy.  We will schedule her for a routine EGD and colonoscopy.  Dysphagia/hoarseness - EGD - Stop Prilosec given no improvement in symptoms  CRC screening  - Colonoscopy  The details, risks (including bleeding, perforation, infection, missed lesions, medication reactions and possible hospitalization or surgery if complications occur), benefits, and alternatives to EGD/colonoscopy with possible biopsy, dilation and polypectomy were discussed with the patient and she consents to proceed.   Chany Woolworth E. Candis Schatz, MD [facility]  CC:  [name], [name], MD

## 2021-05-31 DIAGNOSIS — R29818 Other symptoms and signs involving the nervous system: Secondary | ICD-10-CM | POA: Diagnosis not present

## 2021-05-31 DIAGNOSIS — Z6841 Body Mass Index (BMI) 40.0 and over, adult: Secondary | ICD-10-CM | POA: Diagnosis not present

## 2021-05-31 DIAGNOSIS — I10 Essential (primary) hypertension: Secondary | ICD-10-CM | POA: Diagnosis not present

## 2021-05-31 DIAGNOSIS — Z981 Arthrodesis status: Secondary | ICD-10-CM | POA: Diagnosis not present

## 2021-06-01 ENCOUNTER — Other Ambulatory Visit: Payer: Self-pay | Admitting: Neurological Surgery

## 2021-06-01 ENCOUNTER — Telehealth: Payer: Self-pay | Admitting: Family Medicine

## 2021-06-01 ENCOUNTER — Other Ambulatory Visit: Payer: Self-pay

## 2021-06-01 ENCOUNTER — Ambulatory Visit: Payer: Medicare HMO | Admitting: Pharmacist Clinician (PhC)/ Clinical Pharmacy Specialist

## 2021-06-01 DIAGNOSIS — G720 Drug-induced myopathy: Secondary | ICD-10-CM

## 2021-06-01 DIAGNOSIS — E785 Hyperlipidemia, unspecified: Secondary | ICD-10-CM | POA: Diagnosis not present

## 2021-06-01 DIAGNOSIS — G9519 Other vascular myelopathies: Secondary | ICD-10-CM

## 2021-06-01 DIAGNOSIS — T466X5A Adverse effect of antihyperlipidemic and antiarteriosclerotic drugs, initial encounter: Secondary | ICD-10-CM

## 2021-06-01 NOTE — Patient Instructions (Signed)
Your Results:             Your most recent labs Goal  Total Cholesterol 142 < 200  Triglycerides 105 < 150  HDL (happy/good cholesterol) 39 > 40  LDL (lousy/bad cholesterol 84 < 70     Medication changes:  We will start the process to get Repatha covered by your insurance.  Once approved, you will do one injection every 14 days.  We will repeat cholesterol labs after 4-6 doses.    Patient Assistance:  The [employer] foundation offers assistance to help pay for medication copays.  They will cover copays for all cholesterol lowering meds, including statins, fibrates, omega-3 oils, ezetimibe, Repatha, Praluent, Nexletol, Nexlizet.  The cards are usually good for $2,500 or 12 months, whichever comes first. Go to healthwellfoundation.org Click on Apply Now Answer questions as to whom is applying (patient or representative) Your disease fund will be hypercholesterolemia - Medicare access They will ask questions about finances and which medications you are taking for cholesterol When you submit, the approval is usually within minutes.  You will need to print the card information from the site You will need to show this information to your pharmacy, they will bill your Medicare Part D plan first -then bill Health Well --for the copay.   You can also call them at [phone], although the hold times can be quite long.   Thank you for choosing [facility]

## 2021-06-01 NOTE — Progress Notes (Signed)
06/01/2021 Amanda Cordova 02/23/1961 [id number]   HPI:  Amanda Cordova is a 61 y.o. female patient of Dr Stanford Breed, who presents today for a lipid clinic evaluation.  See pertinent past medical history below.  She has previously tried 3 different statin drugs, even taking just 2-3 times per week, but unable to tolerate due to myalgias.  She was then given ezetimibe, and took this for a month or so before developing back pain.  She stopped the medication two days ago and is now noting relief.   She is here today to discuss options.    Past Medical History: CAD Noted in chest CT (aortic atherosclerosis and CAC)  hypertension Controlled on amlodipine  OSA CPAP nightly  Raynaud's Uses amlodipine  Pre-diabetes 9/22" A1c 5.6 (was up to 5.8)    Current Medications: none  Cholesterol Goals: LDL < 70   Intolerant/previously tried: atorvastatin, rosuvastatin, pravastatin, ezetimibe - myalgias  Family history: father had MI, died in late 37's from this, mom died at 53, COPD, heart disease; 4 siblings, all with hypertension, daughter with hypertension  Diet: mix, when eating out sit  down; good mix of vegetables, mostly frozen; lots of chicken, occaiosnal beef, no pork; not much for snacking, coffee/cake after dinner ([place] thing)  Exercise:  walks dog daily, hasn't been able to go more than 30 -40 min d/t statin pain  Labs: 1/23:  TC 142, TG 105, HDL 39, LDL 84 (on ezetimibe 10 mg)   Current Outpatient Medications  Medication Sig Dispense Refill   albuterol (VENTOLIN HFA) 108 (90 Base) MCG/ACT inhaler Inhale 2 puffs into the lungs every 6 (six) hours as needed for wheezing or shortness of breath. 54 g 1   ALPRAZolam (XANAX) 0.25 MG tablet TAKE 1 TABLET(0.25 MG) BY MOUTH DAILY AS NEEDED FOR ANXIETY 30 tablet 3   amLODipine (NORVASC) 5 MG tablet TAKE 1 TABLET(5 MG) BY MOUTH DAILY 90 tablet 2   aspirin EC 81 MG tablet Take 1 tablet (81 mg total) by mouth daily. RESTART in 7 days  30 tablet 11   fluticasone-salmeterol (WIXELA INHUB) 250-50 MCG/ACT AEPB INHALE 1 PUFF INTO THE LUNGS TWICE DAILY 180 each 2   Respiratory Therapy Supplies (CARETOUCH 2 CPAP HOSE HANGER) MISC      No current facility-administered medications for this visit.    Allergies  Allergen Reactions   Gabapentin Anaphylaxis and Swelling    Throat and tongue swells Throat and tongue swells Other reaction(s): eyes swelled, throat, hands,   Tramadol Swelling    Throat and hands   Crestor [Rosuvastatin] Other (See Comments)   Duloxetine Nausea And Vomiting   Lipitor [Atorvastatin]     myalgias   Pravastatin     myalgias   Tramadol Hcl     Other reaction(s): nausea/dizzy/blurry vision/hands tingling   Zetia [Ezetimibe]     myalgias   Lisinopril Nausea Only    Past Medical History:  Diagnosis Date   Acoustic neuroma (HCC)    CAD (coronary artery disease)    COPD (chronic obstructive pulmonary disease) (HCC)    Family history of bladder cancer    Family history of breast cancer    Family history of thyroid cancer    Hyperlipemia    Hypertension    Neuropathy    Optic neuritis    Sleep apnea    wears cpap    Blood pressure (!) 154/88, pulse 61, resp. rate 15, height 5\' 4"  (1.626 m), weight 250 lb (113.4 kg), SpO2 96 %.  Hyperlipidemia Patient with CAD and hyperlipidemia, currently not at LDL goal of < 70, however had to discontinue ezetimibe two days ago due to back pain.  Reviewed options for lowering LDL cholesterol, including  PCSK-9 inhibitors, bempedoic acid and inclisiran.  Discussed mechanisms of action, dosing, side effects and potential decreases in LDL cholesterol.  Also reviewed cost information and potential options for patient assistance.  Answered all patient questions.  Based on this information, patient would prefer to start PCSK9i.  Her insurance prefers Repatha, so will get approval for Repatha 140 mg q14d and labs after 4-6 doses.     Tommy Medal PharmD CPP  [facility] [address] [place],  [address] [phone]

## 2021-06-01 NOTE — Telephone Encounter (Signed)
Left message for patient to call back and schedule Medicare Annual Wellness Visit (AWV) either virtually or in office. Left  my Herbie Drape number [phone]   Last AWV 07/01/20 ; please schedule at anytime with [facility] Nurse Health Advisor 1 or 2   This should be a 45 minute visit.   Awv can be scheduled calendar year [place]

## 2021-06-01 NOTE — Assessment & Plan Note (Signed)
Patient with CAD and hyperlipidemia, currently not at LDL goal of < 70, however had to discontinue ezetimibe two days ago due to back pain.  Reviewed options for lowering LDL cholesterol, including  PCSK-9 inhibitors, bempedoic acid and inclisiran.  Discussed mechanisms of action, dosing, side effects and potential decreases in LDL cholesterol.  Also reviewed cost information and potential options for patient assistance.  Answered all patient questions.  Based on this information, patient would prefer to start PCSK9i.  Her insurance prefers Repatha, so will get approval for Repatha 140 mg q14d and labs after 4-6 doses.

## 2021-06-02 ENCOUNTER — Telehealth: Payer: Self-pay | Admitting: Family Medicine

## 2021-06-02 NOTE — Chronic Care Management (AMB) (Signed)
°  Chronic Care Management   Note  06/02/2021 Name: Ladina Shutters MRN: [id number] DOB: October 04, 1960  Deira Shimer Durr is a 61 y.o. year old female who is a primary care patient of [name], Malka So, MD. I reached out to Rosetta Posner Olivero by phone today in response to a referral sent by Ms. Rosetta Posner Resurreccion's PCP, [name], [name], MD.   Ms. Purkey was given information about Chronic Care Management services today including:  CCM service includes personalized support from designated clinical staff supervised by her physician, including individualized plan of care and coordination with other care providers 24/7 contact phone numbers for assistance for urgent and routine care needs. Service will only be billed when office clinical staff spend 20 minutes or more in a month to coordinate care. Only one practitioner may furnish and bill the service in a calendar month. The patient may stop CCM services at any time (effective at the end of the month) by phone call to the office staff.   Patient agreed to services and verbal consent obtained.   Follow up plan:   Tatjana Secretary/administrator

## 2021-06-07 ENCOUNTER — Ambulatory Visit (INDEPENDENT_AMBULATORY_CARE_PROVIDER_SITE_OTHER): Payer: Medicare HMO

## 2021-06-07 VITALS — Ht 64.0 in | Wt 240.0 lb

## 2021-06-07 DIAGNOSIS — Z0000 Encounter for general adult medical examination without abnormal findings: Secondary | ICD-10-CM | POA: Diagnosis not present

## 2021-06-07 NOTE — Progress Notes (Signed)
I connected with  Amanda Cordova today via telehealth video enabled device and verified that I am speaking with the correct person using two identifiers.   Location: Patient: home Provider: work  Persons participating in virtual visit: Mahealani Sulak, Elisha Ponder LPN  I discussed the limitations, risks, security and privacy concerns of performing an evaluation and management service by video and the availability of in person appointments. The patient expressed understanding and agreed to proceed.   Some vital signs may be absent or patient reported.     Subjective:   Amanda Cordova is a 61 y.o. female who presents for Medicare Annual (Subsequent) preventive examination.  Review of Systems     Cardiac Risk Factors include: advanced age (>56men, >11 women);dyslipidemia;hypertension;obesity (BMI >30kg/m2)     Objective:    Today's Vitals   06/07/21 1112 06/07/21 1113  Weight: 240 lb (108.9 kg)   Height: 5\' 4"  (1.626 m)   PainSc:  5    Body mass index is 41.2 kg/m.  Advanced Directives 06/07/2021 07/22/2020 07/01/2020 05/28/2020 05/25/2020 04/21/2020 01/10/2018  Does Patient Have a Medical Advance Directive? No Yes Yes Yes Yes Yes No  Type of Advance Directive - Healthcare Power of [place];Living will Healthcare Power of [place];Living will Healthcare Power of [place];Living will Healthcare Power of [place];Living will - -  Does patient want to make changes to medical advance directive? - - No - Patient declined No - Patient declined No - Patient declined - -  Copy of Healthcare Power of Attorney in Chart? - - - No - copy requested No - copy requested - -  Would patient like information on creating a medical advance directive? - - - - - - No - Patient declined    Current Medications (verified) Outpatient Encounter Medications as of 06/07/2021  Medication Sig   albuterol (VENTOLIN HFA) 108 (90 Base) MCG/ACT inhaler Inhale 2 puffs into the lungs every 6 (six)  hours as needed for wheezing or shortness of breath.   ALPRAZolam (XANAX) 0.25 MG tablet TAKE 1 TABLET(0.25 MG) BY MOUTH DAILY AS NEEDED FOR ANXIETY   amLODipine (NORVASC) 5 MG tablet TAKE 1 TABLET(5 MG) BY MOUTH DAILY   aspirin EC 81 MG tablet Take 1 tablet (81 mg total) by mouth daily. RESTART in 7 days   fluticasone-salmeterol (WIXELA INHUB) 250-50 MCG/ACT AEPB INHALE 1 PUFF INTO THE LUNGS TWICE DAILY   Respiratory Therapy Supplies (CARETOUCH 2 CPAP HOSE HANGER) MISC    No facility-administered encounter medications on file as of 06/07/2021.    Allergies (verified) Gabapentin, Tramadol, Crestor [rosuvastatin], Duloxetine, Lipitor [atorvastatin], Pravastatin, Tramadol hcl, Zetia [ezetimibe], and Lisinopril   History: Past Medical History:  Diagnosis Date   Acoustic neuroma (HCC)    CAD (coronary artery disease)    COPD (chronic obstructive pulmonary disease) (HCC)    Family history of bladder cancer    Family history of breast cancer    Family history of thyroid cancer    Hyperlipemia    Hypertension    Neuropathy    Optic neuritis    Sleep apnea    wears cpap   Past Surgical History:  Procedure Laterality Date   ABDOMINAL HYSTERECTOMY     ANTERIOR CERVICAL DECOMP/DISCECTOMY FUSION N/A 05/28/2020   Procedure: ANTERIOR CERVICAL DECOMPRESSION FUSION CERVICAL THREE-FOUR.;  Surgeon: 07/26/2020, DO;  Location: [facility] OR;  Service: Neurosurgery;  Laterality: N/A;  anterior   BREAST EXCISIONAL BIOPSY Right 2000   benign   cerical fusion  1995  CHOLECYSTECTOMY     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   HYSTERECTOMY ABDOMINAL WITH SALPINGECTOMY     MENISCUS REPAIR Left    Family History  Problem Relation Age of Onset   Breast cancer Sister    Diabetes Sister 42       negative genetic test (VUS in CHEK2 [id number] possibly mosaic)   COPD Mother    Heart failure Mother    Heart attack Father    Diabetes Maternal Aunt    Breast cancer Sister 15   Breast cancer Sister         dx 87s   Bladder Cancer Maternal Uncle        dx >50   Bladder Cancer Other        dx 57s (mother's first cousin)   Thyroid cancer Other        dx 2s (mother's first cousin)   Social History   Socioeconomic History   Marital status: Married    Spouse name: Not on file   Number of children: 4   Years of education: 12   Highest education level: 12th grade  Occupational History   Occupation: Disabled  Tobacco Use   Smoking status: Former    Packs/day: 1.00    Years: 35.00    Pack years: 35.00    Types: Cigarettes    Quit date: 01/09/2018    Years since quitting: 3.4    Passive exposure: Current   Smokeless tobacco: Never  Vaping Use   Vaping Use: Never used  Substance and Sexual Activity   Alcohol use: Not Currently   Drug use: Never   Sexual activity: Yes  Other Topics Concern   Not on file  Social History Narrative   Lives at home with her husband and her 42.   Right-handed.   2-3 cups caffeine per day.    Social Determinants of Health   Financial Resource Strain: Low Risk    Difficulty of Paying Living Expenses: Not hard at all  Food Insecurity: Food Insecurity Present   Worried About Charity fundraiser in the Last Year: Sometimes true   Arboriculturist in the Last Year: Sometimes true  Transportation Needs: No Transportation Needs   Lack of Transportation (Medical): No   Lack of Transportation (Non-Medical): No  Physical Activity: Sufficiently Active   Days of Exercise per Week: 7 days   Minutes of Exercise per Session: 60 min  Stress: No Stress Concern Present   Feeling of Stress : Not at all  Social Connections: Unknown   Frequency of Communication with Friends and Family: More than three times a week   Frequency of Social Gatherings with Friends and Family: Twice a week   Attends Religious Services: Patient refused   Marine scientist or Organizations: No   Attends Music therapist: Never   Marital Status: Married     Tobacco Counseling Counseling given: Not Answered   Clinical Intake:  Pre-visit preparation completed: Yes  Pain : 0-10 Pain Score: 5  Pain Type: Chronic pain Pain Location: Back Pain Orientation: Lower Pain Descriptors / Indicators: Discomfort Pain Onset: More than a month ago Pain Frequency: Constant     Nutritional Status: BMI > 30  Obese Nutritional Risks: None Diabetes: No  How often do you need to have someone help you when you read instructions, pamphlets, or other written materials from your doctor or pharmacy?: 1 - Never What is the last grade level you completed  in school?: diploma high school  Diabetic?no     Information entered by :: NAllen LPN   Activities of Daily Living In your present state of health, do you have any difficulty performing the following activities: 06/07/2021 06/06/2021  Hearing? Y -  Comment deaf in left ear -  Vision? Y Y  Comment macular degeneration -  Difficulty concentrating or making decisions? N N  Walking or climbing stairs? Y Y  Dressing or bathing? N N  Doing errands, shopping? N N  Preparing Food and eating ? N N  Using the Toilet? N N  In the past six months, have you accidently leaked urine? N N  Do you have problems with loss of bowel control? N N  Managing your Medications? N N  Managing your Finances? N N  Housekeeping or managing your Housekeeping? N N  Some recent data might be hidden    Patient Care Team: [name], [name], MD as PCP - General (Family Medicine) Stanford Breed Denice Bors, MD as PCP - Cardiology (Cardiology) Garrel Ridgel, DPM as Consulting Physician (Podiatry) Viona Gilmore, [facility] as Pharmacist (Pharmacist)  Indicate any recent Medical Services you may have received from other than [facility] providers in the past year (date may be approximate).     Assessment:   This is a routine wellness examination for Amanda Cordova.  Hearing/Vision screen No results found.  Dietary issues and exercise  activities discussed: Current Exercise Habits: Home exercise routine, Type of exercise: walking, Time (Minutes): 60, Frequency (Times/Week): 7, Weekly Exercise (Minutes/Week): 420   Goals Addressed             This Visit's Progress    Patient Stated       06/07/2021, wants to lose 100 pounds       Depression Screen PHQ 2/9 Scores 06/07/2021 04/21/2021 07/01/2020 12/25/2019 05/09/2016  PHQ - 2 Score 0 0 0 0 2  PHQ- 9 Score - 2 - - 9    Fall Risk Fall Risk  06/07/2021 06/06/2021 04/21/2021 07/01/2020 05/09/2016  Falls in the past year? 1 - 1 1 No  Comment legs give out - - - -  Number falls in past yr: $Remove'1 1 1 'CrZSBbm$ 0 -  Injury with Fall? 1 1 0 0 -  Comment tore meniscus - - - -  Risk for fall due to : Impaired balance/gait;Impaired mobility;Medication side effect - - History of fall(s) Impaired balance/gait  Follow up Falls evaluation completed;Education provided;Falls prevention discussed - - Falls evaluation completed;Falls prevention discussed -    FALL RISK PREVENTION PERTAINING TO THE HOME:  Any stairs in or around the home? Yes  If so, are there any without handrails? No  Home free of loose throw rugs in walkways, pet beds, electrical cords, etc? Yes  Adequate lighting in your home to reduce risk of falls? Yes   ASSISTIVE DEVICES UTILIZED TO PREVENT FALLS:  Life alert? No  Use of a cane, walker or w/c? Yes  Grab bars in the bathroom? No  Shower chair or bench in shower? No  Elevated toilet seat or a handicapped toilet? No   TIMED UP AND GO:  Was the test performed? No .      Cognitive Function:     6CIT Screen 06/07/2021  What Year? 0 points  What month? 0 points  What time? 0 points  Count back from 20 0 points  Months in reverse 0 points  Repeat phrase 4 points  Total Score 4  Immunizations Immunization History  Administered Date(s) Administered   Influenza Split 02/02/2001, 03/06/2012, 12/11/2013   Influenza-Unspecified 12/05/2012   Td 09/06/2005    TDAP  status: Due, Education has been provided regarding the importance of this vaccine. Advised may receive this vaccine at local pharmacy or Health Dept. Aware to provide a copy of the vaccination record if obtained from local pharmacy or Health Dept. Verbalized acceptance and understanding.  Flu Vaccine status: Declined, Education has been provided regarding the importance of this vaccine but patient still declined. Advised may receive this vaccine at local pharmacy or Health Dept. Aware to provide a copy of the vaccination record if obtained from local pharmacy or Health Dept. Verbalized acceptance and understanding.  Pneumococcal vaccine status: Up to date  Covid-19 vaccine status: Declined, Education has been provided regarding the importance of this vaccine but patient still declined. Advised may receive this vaccine at local pharmacy or Health Dept.or vaccine clinic. Aware to provide a copy of the vaccination record if obtained from local pharmacy or Health Dept. Verbalized acceptance and understanding.  Qualifies for Shingles Vaccine? Yes   Zostavax completed No   Shingrix Completed?: No.    Education has been provided regarding the importance of this vaccine. Patient has been advised to call insurance company to determine out of pocket expense if they have not yet received this vaccine. Advised may also receive vaccine at local pharmacy or Health Dept. Verbalized acceptance and understanding.  Screening Tests Health Maintenance  Topic Date Due   COVID-19 Vaccine (1) Never done   Hepatitis C Screening  Never done   Zoster Vaccines- Shingrix (1 of 2) Never done   TETANUS/TDAP  09/07/2015   MAMMOGRAM  05/19/2021   COLONOSCOPY (Pts 45-79yrs Insurance coverage will need to be confirmed)  04/18/2026   HIV Screening  Completed   HPV VACCINES  Aged Out   INFLUENZA VACCINE  Discontinued   PAP SMEAR-Modifier  Discontinued    Health Maintenance  Health Maintenance Due  Topic Date Due    COVID-19 Vaccine (1) Never done   Hepatitis C Screening  Never done   Zoster Vaccines- Shingrix (1 of 2) Never done   TETANUS/TDAP  09/07/2015   MAMMOGRAM  05/19/2021    Colorectal cancer screening: Type of screening: Colonoscopy. Completed 04/18/2016. Repeat every 10 years  Mammogram status: patient to schedule  Bone Density status: n/a  Lung Cancer Screening: (Low Dose CT Chest recommended if Age 67-80 years, 30 pack-year currently smoking OR have quit w/in 15years.) does qualify.   Lung Cancer Screening Referral: CT scan 02/24/2021  Additional Screening:  Hepatitis C Screening: does qualify;  Vision Screening: Recommended annual ophthalmology exams for early detection of glaucoma and other disorders of the eye. Is the patient up to date with their annual eye exam?  Yes  Who is the provider or what is the name of the office in which the patient attends annual eye exams? [facility] If pt is not established with a provider, would they like to be referred to a provider to establish care? No .   Dental Screening: Recommended annual dental exams for proper oral hygiene  Community Resource Referral / Chronic Care Management: CRR required this visit?  No   CCM required this visit?  No      Plan:     I have personally reviewed and noted the following in the patients chart:   Medical and social history Use of alcohol, tobacco or illicit drugs  Current medications and supplements  including opioid prescriptions.  Functional ability and status Nutritional status Physical activity Advanced directives List of other physicians Hospitalizations, surgeries, and ER visits in previous 12 months Vitals Screenings to include cognitive, depression, and falls Referrals and appointments  In addition, I have reviewed and discussed with patient certain preventive protocols, quality metrics, and best practice recommendations. A written personalized care plan for preventive services  as well as general preventive health recommendations were provided to patient.     Kellie Simmering, LPN   0/04/2391   Nurse Notes: none  Due to this being a virtual visit, the after visit summary with patients personalized plan was offered to patient via  my-chart. Patient would like to access on my-chart

## 2021-06-07 NOTE — Patient Instructions (Signed)
Amanda Cordova , Thank you for taking time to come for your Medicare Wellness Visit. I appreciate your ongoing commitment to your health goals. Please review the following plan we discussed and let me know if I can assist you in the future.   Screening recommendations/referrals: Colonoscopy: completed 04/18/2016, due 04/18/2026 Mammogram: patient to schedule Bone Density: completed 01/04/2017 Recommended yearly ophthalmology/optometry visit for glaucoma screening and checkup Recommended yearly dental visit for hygiene and checkup  Vaccinations: Influenza vaccine: decline Pneumococcal vaccine: n/a Tdap vaccine: decline at this time Shingles vaccine: discussed  Covid-19: decline  Advanced directives: Advance directive discussed with you today.   Conditions/risks identified: none  Next appointment: Follow up in one year for your annual wellness visit.   Preventive Care 40-64 Years, Female Preventive care refers to lifestyle choices and visits with your health care provider that can promote health and wellness. What does preventive care include? A yearly physical exam. This is also called an annual well check. Dental exams once or twice a year. Routine eye exams. Ask your health care provider how often you should have your eyes checked. Personal lifestyle choices, including: Daily care of your teeth and gums. Regular physical activity. Eating a healthy diet. Avoiding tobacco and drug use. Limiting alcohol use. Practicing safe sex. Taking low-dose aspirin daily starting at age 20. Taking vitamin and mineral supplements as recommended by your health care provider. What happens during an annual well check? The services and screenings done by your health care provider during your annual well check will depend on your age, overall health, lifestyle risk factors, and family history of disease. Counseling  Your health care provider may ask you questions about your: Alcohol use. Tobacco  use. Drug use. Emotional well-being. Home and relationship well-being. Sexual activity. Eating habits. Work and work Statistician. Method of birth control. Menstrual cycle. Pregnancy history. Screening  You may have the following tests or measurements: Height, weight, and BMI. Blood pressure. Lipid and cholesterol levels. These may be checked every 5 years, or more frequently if you are over 63 years old. Skin check. Lung cancer screening. You may have this screening every year starting at age 32 if you have a 30-pack-year history of smoking and currently smoke or have quit within the past 15 years. Fecal occult blood test (FOBT) of the stool. You may have this test every year starting at age 4. Flexible sigmoidoscopy or colonoscopy. You may have a sigmoidoscopy every 5 years or a colonoscopy every 10 years starting at age 82. Hepatitis C blood test. Hepatitis B blood test. Sexually transmitted disease (STD) testing. Diabetes screening. This is done by checking your blood sugar (glucose) after you have not eaten for a while (fasting). You may have this done every 1-3 years. Mammogram. This may be done every 1-2 years. Talk to your health care provider about when you should start having regular mammograms. This may depend on whether you have a family history of breast cancer. BRCA-related cancer screening. This may be done if you have a family history of breast, ovarian, tubal, or peritoneal cancers. Pelvic exam and Pap test. This may be done every 3 years starting at age 35. Starting at age 50, this may be done every 5 years if you have a Pap test in combination with an HPV test. Bone density scan. This is done to screen for osteoporosis. You may have this scan if you are at high risk for osteoporosis. Discuss your test results, treatment options, and if necessary, the need for  more tests with your health care provider. Vaccines  Your health care provider may recommend certain vaccines,  such as: Influenza vaccine. This is recommended every year. Tetanus, diphtheria, and acellular pertussis (Tdap, Td) vaccine. You may need a Td booster every 10 years. Zoster vaccine. You may need this after age 29. Pneumococcal 13-valent conjugate (PCV13) vaccine. You may need this if you have certain conditions and were not previously vaccinated. Pneumococcal polysaccharide (PPSV23) vaccine. You may need one or two doses if you smoke cigarettes or if you have certain conditions. Talk to your health care provider about which screenings and vaccines you need and how often you need them. This information is not intended to replace advice given to you by your health care provider. Make sure you discuss any questions you have with your health care provider. Document Released: 05/01/2015 Document Revised: 12/23/2015 Document Reviewed: 02/03/2015 Elsevier Interactive Patient Education  2017 [place] Prevention in the Home Falls can cause injuries. They can happen to people of all ages. There are many things you can do to make your home safe and to help prevent falls. What can I do on the outside of my home? Regularly fix the edges of walkways and driveways and fix any cracks. Remove anything that might make you trip as you walk through a door, such as a raised step or threshold. Trim any bushes or trees on the path to your home. Use bright outdoor lighting. Clear any walking paths of anything that might make someone trip, such as rocks or tools. Regularly check to see if handrails are loose or broken. Make sure that both sides of any steps have handrails. Any raised decks and porches should have guardrails on the edges. Have any leaves, snow, or ice cleared regularly. Use sand or salt on walking paths during winter. Clean up any spills in your garage right away. This includes oil or grease spills. What can I do in the bathroom? Use night lights. Install grab bars by the toilet and  in the tub and shower. Do not use towel bars as grab bars. Use non-skid mats or decals in the tub or shower. If you need to sit down in the shower, use a plastic, non-slip stool. Keep the floor dry. Clean up any water that spills on the floor as soon as it happens. Remove soap buildup in the tub or shower regularly. Attach bath mats securely with double-sided non-slip rug tape. Do not have throw rugs and other things on the floor that can make you trip. What can I do in the bedroom? Use night lights. Make sure that you have a light by your bed that is easy to reach. Do not use any sheets or blankets that are too big for your bed. They should not hang down onto the floor. Have a firm chair that has side arms. You can use this for support while you get dressed. Do not have throw rugs and other things on the floor that can make you trip. What can I do in the kitchen? Clean up any spills right away. Avoid walking on wet floors. Keep items that you use a lot in easy-to-reach places. If you need to reach something above you, use a strong step stool that has a grab bar. Keep electrical cords out of the way. Do not use floor polish or wax that makes floors slippery. If you must use wax, use non-skid floor wax. Do not have throw rugs and other things  on the floor that can make you trip. What can I do with my stairs? Do not leave any items on the stairs. Make sure that there are handrails on both sides of the stairs and use them. Fix handrails that are broken or loose. Make sure that handrails are as long as the stairways. Check any carpeting to make sure that it is firmly attached to the stairs. Fix any carpet that is loose or worn. Avoid having throw rugs at the top or bottom of the stairs. If you do have throw rugs, attach them to the floor with carpet tape. Make sure that you have a light switch at the top of the stairs and the bottom of the stairs. If you do not have them, ask someone to add  them for you. What else can I do to help prevent falls? Wear shoes that: Do not have high heels. Have rubber bottoms. Are comfortable and fit you well. Are closed at the toe. Do not wear sandals. If you use a stepladder: Make sure that it is fully opened. Do not climb a closed stepladder. Make sure that both sides of the stepladder are locked into place. Ask someone to hold it for you, if possible. Clearly mark and make sure that you can see: Any grab bars or handrails. First and last steps. Where the edge of each step is. Use tools that help you move around (mobility aids) if they are needed. These include: Canes. Walkers. Scooters. Crutches. Turn on the lights when you go into a dark area. Replace any light bulbs as soon as they burn out. Set up your furniture so you have a clear path. Avoid moving your furniture around. If any of your floors are uneven, fix them. If there are any pets around you, be aware of where they are. Review your medicines with your doctor. Some medicines can make you feel dizzy. This can increase your chance of falling. Ask your doctor what other things that you can do to help prevent falls. This information is not intended to replace advice given to you by your health care provider. Make sure you discuss any questions you have with your health care provider. Document Released: 01/29/2009 Document Revised: 09/10/2015 Document Reviewed: 05/09/2014 Elsevier Interactive Patient Education  2017 [employer].

## 2021-06-08 ENCOUNTER — Telehealth: Payer: Self-pay

## 2021-06-08 DIAGNOSIS — E785 Hyperlipidemia, unspecified: Secondary | ICD-10-CM

## 2021-06-08 MED ORDER — REPATHA SURECLICK 140 MG/ML ~~LOC~~ SOAJ: 140.0000 mg | SUBCUTANEOUS | 11 refills | Status: DC

## 2021-06-08 NOTE — Telephone Encounter (Signed)
Called and stated that they were approved for repatha, rx sent, pt already approved for [facility] and instructed them to complete fasting labs post 4th dose. Pt voiced understanding.

## 2021-06-08 NOTE — Addendum Note (Signed)
Addended by: Allean Found on: 06/08/2021 03:04 PM   Modules accepted: Orders

## 2021-06-08 NOTE — Telephone Encounter (Signed)
PA SUBMITTED FOR REPATHA: Renee Kastelic (Key: [id number])  LIPID/HEPATIC PANEL ORDERED AND RELEASED  PT HAS [facility] ALREADY  CALLED AND SPOKE W/PT TO LET THEM KNOW THAT WE ARE WORKING ON A PA AND THEY VOICED UNDERSTANDING

## 2021-06-11 ENCOUNTER — Encounter: Payer: Self-pay | Admitting: Family Medicine

## 2021-06-11 ENCOUNTER — Telehealth: Payer: Self-pay | Admitting: Family Medicine

## 2021-06-11 ENCOUNTER — Ambulatory Visit (INDEPENDENT_AMBULATORY_CARE_PROVIDER_SITE_OTHER): Payer: Medicare HMO | Admitting: Family Medicine

## 2021-06-11 VITALS — BP 130/84 | HR 72 | Resp 16 | Ht 64.0 in | Wt 248.5 lb

## 2021-06-11 DIAGNOSIS — I25119 Atherosclerotic heart disease of native coronary artery with unspecified angina pectoris: Secondary | ICD-10-CM | POA: Diagnosis not present

## 2021-06-11 DIAGNOSIS — I10 Essential (primary) hypertension: Secondary | ICD-10-CM

## 2021-06-11 DIAGNOSIS — E559 Vitamin D deficiency, unspecified: Secondary | ICD-10-CM

## 2021-06-11 DIAGNOSIS — G959 Disease of spinal cord, unspecified: Secondary | ICD-10-CM

## 2021-06-11 DIAGNOSIS — R490 Dysphonia: Secondary | ICD-10-CM | POA: Diagnosis not present

## 2021-06-11 NOTE — Assessment & Plan Note (Signed)
BP adequately controlled overall. Continue Amlodipine 5 mg daily. Continue monitoring BP at home and if frequently 140/80 or above we need to consider either increasing Amlodipine dose or adding another antihypertensive agent.

## 2021-06-11 NOTE — Telephone Encounter (Signed)
No ofc notes not able to send referral. Waiting on ofc notes.

## 2021-06-11 NOTE — Assessment & Plan Note (Signed)
She has not tolerated statin meds or Zetia. Currently on Repatha. Has appt for fasting labs and following with cardiologist.

## 2021-06-11 NOTE — Patient Instructions (Addendum)
A few things to remember from today's visit:  Essential hypertension  Dysphonia - Plan: Ambulatory referral to ENT  If you need refills please call your pharmacy. Do not use My Chart to request refills or for acute issues that need immediate attention.   No changes today. Ear nose and throat specialist appt will be arranged. Continue monitoring blood pressure. As far as no new problems present I can see you back in 5-6 months.  Please be sure medication list is accurate. If a new problem present, please set up appointment sooner than planned today.

## 2021-06-11 NOTE — Assessment & Plan Note (Signed)
Last 25 OH vit D normal at 41 in 08/2020. Recommend starting Vit D3 800 U daily. We will plan on checking it next visit.

## 2021-06-11 NOTE — Progress Notes (Signed)
[name] is a 61 y.o.female, who is here today for follow up.  She was seen on 04/30/21 for acute visit, dysphagia. Since her last visit she has seen cardiologist, Zetia was stopped and myalgias have greatly.  Has not tolerated statin meds. CAD: Now on Repatha 140 mg q 2 weeks.  Dysphagia for solids. She has also saw GI, EGD was recommended but she cannot afford it. Prilosec was discontinued, symptoms did not changed. No voice changes since her last visit, mild dysphonia intermittent.  Hypertension:  Medications: Amlodipine 5 mg daily BP readings at home:BP "little high", occasional 140-150, most of the time 130's/70's. Side effects: none Negative for unusual or severe headache, visual changes, exertional chest pain, dyspnea,  focal weakness, or edema.  Lab Results  Component Value Date   CREATININE 0.70 08/20/2020   BUN 14 08/20/2020   NA 144 08/20/2020   K 4.2 08/20/2020   CL 107 08/20/2020   CO2 28 08/20/2020   She is eating more vegetables. Also eating fruits:Watermelon,blueberries, and strawberries. Oat meal 1 pk for breakfast. She is not exercising regularly.  Vit D def: She is not on vit D supplementation, wonders if she needs to take any. 25 OH vit D was 41 in 08/2020.  Review of Systems  Constitutional:  Positive for fatigue. Negative for activity change, appetite change and fever.  HENT:  Negative for mouth sores, nosebleeds and sore throat.   Respiratory:  Negative for cough and wheezing.   Gastrointestinal:  Negative for abdominal pain, nausea and vomiting.       Negative for changes in bowel habits.  Genitourinary:  Negative for decreased urine volume and hematuria.  Neurological:  Negative for syncope and facial asymmetry.  Rest see pertinent positives and negatives per HPI.  Current Outpatient Medications on File Prior to Visit  Medication Sig Dispense Refill   albuterol (VENTOLIN HFA) 108 (90 Base) MCG/ACT inhaler Inhale 2 puffs into  the lungs every 6 (six) hours as needed for wheezing or shortness of breath. 54 g 1   ALPRAZolam (XANAX) 0.25 MG tablet TAKE 1 TABLET(0.25 MG) BY MOUTH DAILY AS NEEDED FOR ANXIETY 30 tablet 3   amLODipine (NORVASC) 5 MG tablet TAKE 1 TABLET(5 MG) BY MOUTH DAILY 90 tablet 2   aspirin EC 81 MG tablet Take 1 tablet (81 mg total) by mouth daily. RESTART in 7 days 30 tablet 11   Evolocumab (REPATHA SURECLICK) 169 MG/ML SOAJ Inject 140 mg into the skin every 14 (fourteen) days. 2 mL 11   fluticasone-salmeterol (WIXELA INHUB) 250-50 MCG/ACT AEPB INHALE 1 PUFF INTO THE LUNGS TWICE DAILY 180 each 2   Respiratory Therapy Supplies (CARETOUCH 2 CPAP HOSE HANGER) MISC      No current facility-administered medications on file prior to visit.   Past Medical History:  Diagnosis Date   Acoustic neuroma ([place])    CAD (coronary artery disease)    COPD (chronic obstructive pulmonary disease) (HCC)    Family history of bladder cancer    Family history of breast cancer    Family history of thyroid cancer    Hyperlipemia    Hypertension    Neuropathy    Optic neuritis    Sleep apnea    wears cpap   Allergies  Allergen Reactions   Gabapentin Anaphylaxis and Swelling    Throat and tongue swells Throat and tongue swells Other reaction(s): eyes swelled, throat, hands,   Tramadol Swelling    Throat and hands   Crestor [  Rosuvastatin] Other (See Comments)   Duloxetine Nausea And Vomiting   Lipitor [Atorvastatin]     myalgias   Pravastatin     myalgias   Tramadol Hcl     Other reaction(s): nausea/dizzy/blurry vision/hands tingling   Zetia [Ezetimibe]     myalgias   Lisinopril Nausea Only   Social History   Socioeconomic History   Marital status: Married    Spouse name: Not on file   Number of children: 4   Years of education: 12   Highest education level: 12th grade  Occupational History   Occupation: Disabled  Tobacco Use   Smoking status: Former    Packs/day: 1.00    Years: 35.00    Pack  years: 35.00    Types: Cigarettes    Quit date: 01/09/2018    Years since quitting: 3.4    Passive exposure: Current   Smokeless tobacco: Never  Vaping Use   Vaping Use: Never used  Substance and Sexual Activity   Alcohol use: Not Currently   Drug use: Never   Sexual activity: Yes  Other Topics Concern   Not on file  Social History Narrative   Lives at home with her husband and her great-grandson.   Right-handed.   2-3 cups caffeine per day.    Social Determinants of Health   Financial Resource Strain: Low Risk    Difficulty of Paying Living Expenses: Not hard at all  Food Insecurity: Food Insecurity Present   Worried About Charity fundraiser in the Last Year: Sometimes true   Arboriculturist in the Last Year: Sometimes true  Transportation Needs: No Transportation Needs   Lack of Transportation (Medical): No   Lack of Transportation (Non-Medical): No  Physical Activity: Sufficiently Active   Days of Exercise per Week: 7 days   Minutes of Exercise per Session: 60 min  Stress: No Stress Concern Present   Feeling of Stress : Not at all  Social Connections: Unknown   Frequency of Communication with Friends and Family: More than three times a week   Frequency of Social Gatherings with Friends and Family: Twice a week   Attends Religious Services: Patient refused   Active Member of Clubs or Organizations: No   Attends Archivist Meetings: Never   Marital Status: Married   Vitals:   06/11/21 0712  BP: 130/84  Pulse: 72  Resp: 16  SpO2: 97%   Wt Readings from Last 3 Encounters:  06/11/21 248 lb 8 oz (112.7 kg)  06/07/21 240 lb (108.9 kg)  06/01/21 250 lb (113.4 kg)   Body mass index is 42.65 kg/m.  Physical Exam Vitals and nursing note reviewed.  Constitutional:      General: She is not in acute distress.    Appearance: She is well-developed.  HENT:     Head: Normocephalic and atraumatic.     Mouth/Throat:     Mouth: Mucous membranes are moist.      Pharynx: Oropharynx is clear.  Eyes:     Conjunctiva/sclera: Conjunctivae normal.  Cardiovascular:     Rate and Rhythm: Normal rate and regular rhythm.     Pulses:          Posterior tibial pulses are 2+ on the right side and 2+ on the left side.     Heart sounds: No murmur heard. Pulmonary:     Effort: Pulmonary effort is normal. No respiratory distress.     Breath sounds: Normal breath sounds.  Abdominal:  Palpations: Abdomen is soft. There is no hepatomegaly or mass.     Tenderness: There is no abdominal tenderness.  Lymphadenopathy:     Cervical: No cervical adenopathy.  Skin:    General: Skin is warm.     Findings: No erythema or rash.  Neurological:     General: No focal deficit present.     Mental Status: She is alert and oriented to person, place, and time.     Cranial Nerves: No cranial nerve deficit.     Comments: Antalgic gait assisted by a cane.  Psychiatric:     Comments: Well groomed, good eye contact.   ASSESSMENT AND PLAN:   Ms.Cathline was seen today for follow-up.  Diagnoses and all orders for this visit:  Dysphonia We discussed possible etiologies. Hx and examination do not suggest a serious process. ? GERD. Recently evaluated by GI for dysphagia, could not afford EGD.  She would like to see ENT.  Vitamin D deficiency Last 6 OH vit D normal at 41 in 08/2020. Recommend starting Vit D3 800 U daily. We will plan on checking it next visit.  Morbid obesity ([place]) We discussed benefits of wt loss. Consistency with healthy diet and physical activity encouraged. Plant-based diet recommended.   Hypertension BP adequately controlled overall. Continue Amlodipine 5 mg daily. Continue monitoring BP at home and if frequently 140/80 or above we need to consider either increasing Amlodipine dose or adding another antihypertensive agent.  CAD (coronary artery disease) She has not tolerated statin meds or Zetia. Currently on Repatha. Has appt for fasting  labs and following with cardiologist.  Return in about 6 months (around 12/09/2021).  Noriel Guthrie G. [name], MD  [facility]. [place] office.

## 2021-06-11 NOTE — Assessment & Plan Note (Signed)
We discussed benefits of wt loss. Consistency with healthy diet and physical activity encouraged. Plant-based diet recommended.

## 2021-06-13 ENCOUNTER — Ambulatory Visit
Admission: RE | Admit: 2021-06-13 | Discharge: 2021-06-13 | Disposition: A | Discharge status: Home / Self Care | Payer: Medicare HMO | Source: Ambulatory Visit | Attending: Neurological Surgery | Admitting: Neurological Surgery

## 2021-06-13 ENCOUNTER — Other Ambulatory Visit: Payer: Self-pay

## 2021-06-13 DIAGNOSIS — M545 Low back pain, unspecified: Secondary | ICD-10-CM | POA: Diagnosis not present

## 2021-06-13 DIAGNOSIS — G9519 Other vascular myelopathies: Secondary | ICD-10-CM

## 2021-06-13 DIAGNOSIS — M48061 Spinal stenosis, lumbar region without neurogenic claudication: Secondary | ICD-10-CM | POA: Diagnosis not present

## 2021-06-13 DIAGNOSIS — M5136 Other intervertebral disc degeneration, lumbar region: Secondary | ICD-10-CM | POA: Diagnosis not present

## 2021-06-15 ENCOUNTER — Encounter: Payer: Self-pay | Admitting: Family Medicine

## 2021-06-15 DIAGNOSIS — Z1231 Encounter for screening mammogram for malignant neoplasm of breast: Secondary | ICD-10-CM

## 2021-06-17 DIAGNOSIS — G4733 Obstructive sleep apnea (adult) (pediatric): Secondary | ICD-10-CM | POA: Diagnosis not present

## 2021-06-18 DIAGNOSIS — M5442 Lumbago with sciatica, left side: Secondary | ICD-10-CM | POA: Diagnosis not present

## 2021-06-18 DIAGNOSIS — M4696 Unspecified inflammatory spondylopathy, lumbar region: Secondary | ICD-10-CM | POA: Diagnosis not present

## 2021-06-18 DIAGNOSIS — N281 Cyst of kidney, acquired: Secondary | ICD-10-CM | POA: Diagnosis not present

## 2021-06-18 DIAGNOSIS — M5441 Lumbago with sciatica, right side: Secondary | ICD-10-CM | POA: Diagnosis not present

## 2021-06-21 ENCOUNTER — Other Ambulatory Visit: Payer: Self-pay | Admitting: Neurological Surgery

## 2021-06-21 DIAGNOSIS — N281 Cyst of kidney, acquired: Secondary | ICD-10-CM

## 2021-06-23 ENCOUNTER — Encounter: Payer: Self-pay | Admitting: Family Medicine

## 2021-06-23 ENCOUNTER — Encounter: Payer: Self-pay | Admitting: Cardiology

## 2021-06-28 DIAGNOSIS — Z1231 Encounter for screening mammogram for malignant neoplasm of breast: Secondary | ICD-10-CM | POA: Diagnosis not present

## 2021-06-28 LAB — HM MAMMOGRAPHY

## 2021-06-29 ENCOUNTER — Ambulatory Visit: Payer: Medicare HMO

## 2021-07-02 ENCOUNTER — Encounter: Payer: Self-pay | Admitting: Family Medicine

## 2021-07-06 ENCOUNTER — Encounter: Payer: Medicare HMO | Admitting: Gastroenterology

## 2021-07-12 DIAGNOSIS — M48062 Spinal stenosis, lumbar region with neurogenic claudication: Secondary | ICD-10-CM | POA: Diagnosis not present

## 2021-07-13 ENCOUNTER — Other Ambulatory Visit: Payer: Medicare HMO

## 2021-07-14 ENCOUNTER — Ambulatory Visit
Admission: RE | Admit: 2021-07-14 | Discharge: 2021-07-14 | Disposition: A | Discharge status: Home / Self Care | Payer: Medicare HMO | Source: Ambulatory Visit | Attending: Neurological Surgery | Admitting: Neurological Surgery

## 2021-07-14 DIAGNOSIS — N281 Cyst of kidney, acquired: Secondary | ICD-10-CM

## 2021-07-14 DIAGNOSIS — K760 Fatty (change of) liver, not elsewhere classified: Secondary | ICD-10-CM | POA: Diagnosis not present

## 2021-07-14 DIAGNOSIS — N2889 Other specified disorders of kidney and ureter: Secondary | ICD-10-CM | POA: Diagnosis not present

## 2021-07-14 DIAGNOSIS — I700 Atherosclerosis of aorta: Secondary | ICD-10-CM | POA: Diagnosis not present

## 2021-07-14 DIAGNOSIS — Z9049 Acquired absence of other specified parts of digestive tract: Secondary | ICD-10-CM | POA: Diagnosis not present

## 2021-07-14 MED ORDER — IOPAMIDOL (ISOVUE-300) INJECTION 61%
100.0000 mL | Freq: Once | INTRAVENOUS | Status: AC | PRN
Start: 2021-07-14 — End: 2021-07-14
  Administered 2021-07-14: 100 mL via INTRAVENOUS

## 2021-07-15 ENCOUNTER — Encounter: Payer: Self-pay | Admitting: Cardiology

## 2021-07-19 ENCOUNTER — Telehealth: Payer: Self-pay | Admitting: Physician Assistant

## 2021-07-19 NOTE — Telephone Encounter (Signed)
Scheduled appointment per 04/03 referral. Patient is aware of appointment date and time. Patient is aware to arrive 15 mins prior to appointment time and to bring updated insurance cards. Patient is aware of location.  ? ?

## 2021-07-21 ENCOUNTER — Telehealth: Payer: Self-pay

## 2021-07-21 ENCOUNTER — Inpatient Hospital Stay: Payer: Medicare HMO | Attending: Physician Assistant | Admitting: Physician Assistant

## 2021-07-21 ENCOUNTER — Other Ambulatory Visit: Payer: Self-pay

## 2021-07-21 ENCOUNTER — Inpatient Hospital Stay: Payer: Medicare HMO

## 2021-07-21 ENCOUNTER — Encounter: Payer: Self-pay | Admitting: Physician Assistant

## 2021-07-21 VITALS — BP 144/80 | HR 60 | Temp 97.7°F | Resp 18 | Wt 245.9 lb

## 2021-07-21 DIAGNOSIS — Z8052 Family history of malignant neoplasm of bladder: Secondary | ICD-10-CM | POA: Insufficient documentation

## 2021-07-21 DIAGNOSIS — Z808 Family history of malignant neoplasm of other organs or systems: Secondary | ICD-10-CM | POA: Insufficient documentation

## 2021-07-21 DIAGNOSIS — I2510 Atherosclerotic heart disease of native coronary artery without angina pectoris: Secondary | ICD-10-CM | POA: Insufficient documentation

## 2021-07-21 DIAGNOSIS — G629 Polyneuropathy, unspecified: Secondary | ICD-10-CM | POA: Insufficient documentation

## 2021-07-21 DIAGNOSIS — R110 Nausea: Secondary | ICD-10-CM | POA: Insufficient documentation

## 2021-07-21 DIAGNOSIS — Z79899 Other long term (current) drug therapy: Secondary | ICD-10-CM | POA: Diagnosis not present

## 2021-07-21 DIAGNOSIS — Z87891 Personal history of nicotine dependence: Secondary | ICD-10-CM | POA: Insufficient documentation

## 2021-07-21 DIAGNOSIS — E785 Hyperlipidemia, unspecified: Secondary | ICD-10-CM | POA: Diagnosis not present

## 2021-07-21 DIAGNOSIS — Z9079 Acquired absence of other genital organ(s): Secondary | ICD-10-CM | POA: Diagnosis not present

## 2021-07-21 DIAGNOSIS — Z86018 Personal history of other benign neoplasm: Secondary | ICD-10-CM | POA: Insufficient documentation

## 2021-07-21 DIAGNOSIS — M47816 Spondylosis without myelopathy or radiculopathy, lumbar region: Secondary | ICD-10-CM | POA: Insufficient documentation

## 2021-07-21 DIAGNOSIS — J449 Chronic obstructive pulmonary disease, unspecified: Secondary | ICD-10-CM | POA: Insufficient documentation

## 2021-07-21 DIAGNOSIS — I10 Essential (primary) hypertension: Secondary | ICD-10-CM | POA: Diagnosis not present

## 2021-07-21 DIAGNOSIS — G4733 Obstructive sleep apnea (adult) (pediatric): Secondary | ICD-10-CM | POA: Insufficient documentation

## 2021-07-21 DIAGNOSIS — Z803 Family history of malignant neoplasm of breast: Secondary | ICD-10-CM | POA: Insufficient documentation

## 2021-07-21 DIAGNOSIS — N2889 Other specified disorders of kidney and ureter: Secondary | ICD-10-CM

## 2021-07-21 LAB — CMP (CANCER CENTER ONLY)
ALT: 13 U/L (ref 0–44)
AST: 19 U/L (ref 15–41)
Albumin: 4.3 g/dL (ref 3.5–5.0)
Alkaline Phosphatase: 78 U/L (ref 38–126)
Anion gap: 5 (ref 5–15)
BUN: 17 mg/dL (ref 8–23)
CO2: 29 mmol/L (ref 22–32)
Calcium: 9.4 mg/dL (ref 8.9–10.3)
Chloride: 109 mmol/L (ref 98–111)
Creatinine: 0.81 mg/dL (ref 0.44–1.00)
GFR, Estimated: >60 mL/min (ref >60)
Glucose, Bld: 106 mg/dL — ABNORMAL HIGH (ref 70–99)
Potassium: 4.3 mmol/L (ref 3.5–5.1)
Sodium: 143 mmol/L (ref 135–145)
Total Bilirubin: 0.5 mg/dL (ref 0.3–1.2)
Total Protein: 7.7 g/dL (ref 6.5–8.1)

## 2021-07-21 LAB — CBC WITH DIFFERENTIAL (CANCER CENTER ONLY)
Abs Immature Granulocytes: 0.02 10*3/uL (ref 0.00–0.07)
Basophils Absolute: 0.1 10*3/uL (ref 0.0–0.1)
Basophils Relative: 1 %
Eosinophils Absolute: 0.2 10*3/uL (ref 0.0–0.5)
Eosinophils Relative: 2 %
HCT: 47.6 % — ABNORMAL HIGH (ref 36.0–46.0)
Hemoglobin: 15.6 g/dL — ABNORMAL HIGH (ref 12.0–15.0)
Immature Granulocytes: 0 %
Lymphocytes Relative: 19 %
Lymphs Abs: 2.2 10*3/uL (ref 0.7–4.0)
MCH: 30.2 pg (ref 26.0–34.0)
MCHC: 32.8 g/dL (ref 30.0–36.0)
MCV: 92.2 fL (ref 80.0–100.0)
Monocytes Absolute: 0.8 10*3/uL (ref 0.1–1.0)
Monocytes Relative: 7 %
Neutro Abs: 8 10*3/uL — ABNORMAL HIGH (ref 1.7–7.7)
Neutrophils Relative %: 71 %
Platelet Count: 282 10*3/uL (ref 150–400)
RBC: 5.16 MIL/uL — ABNORMAL HIGH (ref 3.87–5.11)
RDW: 13.8 % (ref 11.5–15.5)
WBC Count: 11.2 10*3/uL — ABNORMAL HIGH (ref 4.0–10.5)
nRBC: 0 % (ref 0.0–0.2)

## 2021-07-21 LAB — LACTATE DEHYDROGENASE: LDH: 153 U/L (ref 98–192)

## 2021-07-21 MED ORDER — ONDANSETRON HCL 8 MG PO TABS: 8.0000 mg | ORAL_TABLET | Freq: Three times a day (TID) | ORAL | 0 refills | Status: DC | PRN

## 2021-07-21 NOTE — Telephone Encounter (Signed)
Please send STAT referral to [facility] due to left renal mass ? ?STAT referral faxed and confirmation received ?

## 2021-07-21 NOTE — Progress Notes (Signed)
?[facility] ?[place] ?[phone]   [phone] ? ?INITIAL CONSULTATION: ? ?Patient Care Team: ?[name], [name], MD as PCP - General (Family Medicine) ?Lelon Perla, MD as PCP - Cardiology (Cardiology) ?Hyatt, Max T, DPM as Veterinary surgeon) ?Viona Gilmore, [facility] as Pharmacist (Pharmacist) ? ?CHIEF COMPLAINTS/PURPOSE OF CONSULTATION:  ?Left renal mass ? ?HISTORY OF PRESENTING ILLNESS:  ?Amanda Cordova 61 y.o. female with medical history significant for acoustic neuroma s/p resection in November 2011, CAD, COPD, hyperlipidemia, hypertension, obstructive sleep apnea, optic neuritis and left foot neuropathy. She is unaccompanied for this visit.  ? ?On review of the previous records, Amanda Cordova present to Dr. Elwin Sleight at [facility] due to low back pain and neurogenic claudication. She underwent MRI of the lumbar spine on 06/13/2021 due to low back pain that showed multilevel degenerative changes of the lumbar spine. There was an incidental finding of an indeterminate 2.0 cm cystic left renal lesion. A follow up CT scan of the abdomen was obtained on 3/292/2023 that showed a partially exophytic lesion of the anterior inferior pole of the left kidney measuring 2.3 x 2.1 cm.  ? ?At today's visit, Amanda Cordova reports chronic fatigue that has been present for several months. She still pushes herself to complete all her daily activities on her own. She uses a cane to ambulate. Her appetite is unchanged and she adds that her weight oscillates. She has frequent episodes of nausea without any vomiting episodes. She has occasional loose stools without any recurrent episodes of diarrhea. She denies easy bruising or signs of active bleeding. She reports that her low back pain and right leg pain has improved since receiving steroid injection. She has persistent left mid back pain that is intermittent. She reports  some urinary urgency but no pain with urination or hematuria. She denies fevers, chills, night sweats, shortness of breath, chest pain or cough. She has no other complaints. Rest of the 10 point ROS is below.  ? ?MEDICAL HISTORY:  ?Past Medical History:  ?Diagnosis Date  ? Acoustic neuroma ([place])   ? CAD (coronary artery disease)   ? COPD (chronic obstructive pulmonary disease) ([place])   ? Family history of bladder cancer   ? Family history of breast cancer   ? Family history of thyroid cancer   ? Hyperlipemia   ? Hypertension   ? Neuropathy   ? Optic neuritis   ? Sleep apnea   ? wears cpap  ? ? ?SURGICAL HISTORY: ?Past Surgical History:  ?Procedure Laterality Date  ? ABDOMINAL HYSTERECTOMY    ? ANTERIOR CERVICAL DECOMP/DISCECTOMY FUSION N/A 05/28/2020  ? Procedure: ANTERIOR CERVICAL DECOMPRESSION FUSION CERVICAL THREE-FOUR.;  Surgeon: Karsten Ro, DO;  Location: [facility];  Service: Neurosurgery;  Laterality: N/A;  anterior  ? BREAST EXCISIONAL BIOPSY Right 2000  ? benign  ? cerical fusion  1995  ? CHOLECYSTECTOMY    ? 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 HYSTERECTOMY ABDOMINAL WITH SALPINGECTOMY    ? MENISCUS REPAIR Left   ? ? ?SOCIAL HISTORY: ?Social History  ? ?Socioeconomic History  ? Marital status: Married  ?  Spouse name: Not on file  ? Number of children: 4  ? Years of education: 36  ? Highest education level: 12th grade  ?Occupational History  ? Occupation: Disabled  ?Tobacco Use  ? Smoking status: Former  ?  Packs/day: 1.00  ?  Years: 35.00  ?  Pack years:  35.00  ?  Types: Cigarettes  ?  Quit date: 01/09/2018  ?  Years since quitting: 3.5  ?  Passive exposure: Current  ? Smokeless tobacco: Never  ?Vaping Use  ? Vaping Use: Never used  ?Substance and Sexual Activity  ? Alcohol use: Not Currently  ? Drug use: Never  ? Sexual activity: Yes  ?Other Topics Concern  ? Not on file  ?Social History Narrative  ? Lives at home with her husband and her great-grandson.  ? Right-handed.  ? 2-3 cups caffeine per  day.   ? ?Social Determinants of Health  ? ?Financial Resource Strain: Low Risk   ? Difficulty of Paying Living Expenses: Not hard at all  ?Food Insecurity: Food Insecurity Present  ? Worried About Charity fundraiser in the Last Year: Sometimes true  ? Ran Out of Food in the Last Year: Sometimes true  ?Transportation Needs: No Transportation Needs  ? Lack of Transportation (Medical): No  ? Lack of Transportation (Non-Medical): No  ?Physical Activity: Sufficiently Active  ? Days of Exercise per Week: 7 days  ? Minutes of Exercise per Session: 60 min  ?Stress: No Stress Concern Present  ? Feeling of Stress : Not at all  ?Social Connections: Unknown  ? Frequency of Communication with Friends and Family: More than three times a week  ? Frequency of Social Gatherings with Friends and Family: Twice a week  ? Attends Religious Services: Patient refused  ? Active Member of Clubs or Organizations: No  ? Attends Archivist Meetings: Not on file  ? Marital Status: Married  ?Intimate Partner Violence: Not on file  ? ? ?FAMILY HISTORY: ?Family History  ?Problem Relation Age of Onset  ? COPD Mother   ? Heart failure Mother   ? Heart attack Father   ? Breast cancer Sister   ? Diabetes Sister 3  ?     negative genetic test (VUS in CHEK2 c.1420C>T possibly mosaic)  ? Breast cancer Sister 64  ? Breast cancer Sister   ?     dx 73s  ? Diabetes Maternal Aunt   ? Kidney cancer Maternal Uncle   ?     dx >50  ? Bladder Cancer Other   ?     dx 107s (mother's first cousin)  ? Thyroid cancer Other   ?     dx 46s (mother's first cousin)  ? Breast cancer Other   ? ? ?ALLERGIES:  is allergic to gabapentin, tramadol, crestor [rosuvastatin], duloxetine, lipitor [atorvastatin], pravastatin, tramadol hcl, zetia [ezetimibe], and lisinopril. ? ?MEDICATIONS:  ?Current Outpatient Medications  ?Medication Sig Dispense Refill  ? albuterol (VENTOLIN HFA) 108 (90 Base) MCG/ACT inhaler Inhale 2 puffs into the lungs every 6 (six) hours as needed  for wheezing or shortness of breath. 54 g 1  ? ALPRAZolam (XANAX) 0.25 MG tablet TAKE 1 TABLET(0.25 MG) BY MOUTH DAILY AS NEEDED FOR ANXIETY 30 tablet 3  ? amLODipine (NORVASC) 5 MG tablet TAKE 1 TABLET(5 MG) BY MOUTH DAILY 90 tablet 2  ? aspirin EC 81 MG tablet Take 1 tablet (81 mg total) by mouth daily. RESTART in 7 days 30 tablet 11  ? Evolocumab (REPATHA SURECLICK) 917 MG/ML SOAJ Inject 140 mg into the skin every 14 (fourteen) days. 2 mL 11  ? fluticasone-salmeterol (WIXELA INHUB) 250-50 MCG/ACT AEPB INHALE 1 PUFF INTO THE LUNGS TWICE DAILY 180 each 2  ? ondansetron (ZOFRAN) 8 MG tablet Take 1 tablet (8 mg total) by mouth every  8 (eight) hours as needed for nausea or vomiting. 30 tablet 0  ? Respiratory Therapy Supplies (CARETOUCH 2 CPAP HOSE HANGER) MISC     ? ?No current facility-administered medications for this visit.  ? ? ?REVIEW OF SYSTEMS:   ?Constitutional: ( - ) fevers, ( - )  chills , ( - ) night sweats ?Eyes: ( - ) blurriness of vision, ( - ) double vision, ( - ) watery eyes ?Ears, nose, mouth, throat, and face: ( - ) mucositis, ( - ) sore throat ?Respiratory: ( - ) cough, ( - ) dyspnea, ( - ) wheezes ?Cardiovascular: ( - ) palpitation, ( - ) chest discomfort, ( - ) lower extremity swelling ?Gastrointestinal:  ( +) nausea, ( - ) heartburn, ( - ) change in bowel habits ?Skin: ( - ) abnormal skin rashes ?Lymphatics: ( - ) new lymphadenopathy, ( - ) easy bruising ?Neurological: ( + ) numbness, ( - ) tingling, ( - ) new weaknesses ?Behavioral/Psych: ( - ) mood change, ( - ) new changes  ?All other systems were reviewed with the patient and are negative. ? ?PHYSICAL EXAMINATION: ?ECOG PERFORMANCE STATUS: 1 - Symptomatic but completely ambulatory ? ?Vitals:  ? 07/21/21 1250  ?BP: (!) 144/80  ?Pulse: 60  ?Resp: 18  ?Temp: 97.7 ?F (36.5 ?C)  ?SpO2: 98%  ? ?Filed Weights  ? 07/21/21 1250  ?Weight: 245 lb 14.4 oz (111.5 kg)  ? ? ?GENERAL: well appearing female in NAD  ?SKIN: skin color, texture, turgor are  normal, no rashes or significant lesions ?EYES: conjunctiva are pink and non-injected, sclera clear ?OROPHARYNX: no exudate, no erythema; lips, buccal mucosa, and tongue normal  ?NECK: supple, non-tender ?LYMPH

## 2021-07-22 ENCOUNTER — Encounter: Payer: Self-pay | Admitting: Physician Assistant

## 2021-07-22 ENCOUNTER — Encounter: Payer: Self-pay | Admitting: Family Medicine

## 2021-07-24 ENCOUNTER — Other Ambulatory Visit: Payer: Self-pay | Admitting: Family Medicine

## 2021-07-29 ENCOUNTER — Encounter: Payer: Self-pay | Admitting: Physician Assistant

## 2021-07-29 DIAGNOSIS — E785 Hyperlipidemia, unspecified: Secondary | ICD-10-CM | POA: Diagnosis not present

## 2021-07-30 LAB — HEPATIC FUNCTION PANEL
ALT: 11 IU/L (ref 0–32)
AST: 17 IU/L (ref 0–40)
Albumin: 4 g/dL (ref 3.8–4.8)
Alkaline Phosphatase: 93 IU/L (ref 44–121)
Bilirubin Total: 0.5 mg/dL (ref 0.0–1.2)
Bilirubin, Direct: 0.2 mg/dL (ref 0.00–0.40)
Total Protein: 6.5 g/dL (ref 6.0–8.5)

## 2021-07-30 LAB — LIPID PANEL
Chol/HDL Ratio: 2 ratio (ref 0.0–4.4)
Cholesterol, Total: 101 mg/dL (ref 100–199)
HDL: 51 mg/dL (ref >39)
LDL Chol Calc (NIH): 37 mg/dL (ref 0–99)
Triglycerides: 58 mg/dL (ref 0–149)
VLDL Cholesterol Cal: 13 mg/dL (ref 5–40)

## 2021-08-02 DIAGNOSIS — R351 Nocturia: Secondary | ICD-10-CM | POA: Diagnosis not present

## 2021-08-02 DIAGNOSIS — D49512 Neoplasm of unspecified behavior of left kidney: Secondary | ICD-10-CM | POA: Diagnosis not present

## 2021-08-02 DIAGNOSIS — R3915 Urgency of urination: Secondary | ICD-10-CM | POA: Diagnosis not present

## 2021-08-04 ENCOUNTER — Other Ambulatory Visit: Payer: Self-pay | Admitting: Urology

## 2021-08-11 DIAGNOSIS — Z6841 Body Mass Index (BMI) 40.0 and over, adult: Secondary | ICD-10-CM | POA: Diagnosis not present

## 2021-08-11 DIAGNOSIS — M5441 Lumbago with sciatica, right side: Secondary | ICD-10-CM | POA: Diagnosis not present

## 2021-08-13 ENCOUNTER — Encounter: Payer: Self-pay | Admitting: Podiatry

## 2021-08-13 ENCOUNTER — Ambulatory Visit: Payer: Medicare HMO | Admitting: Podiatry

## 2021-08-13 DIAGNOSIS — L603 Nail dystrophy: Secondary | ICD-10-CM | POA: Diagnosis not present

## 2021-08-13 DIAGNOSIS — G5793 Unspecified mononeuropathy of bilateral lower limbs: Secondary | ICD-10-CM | POA: Diagnosis not present

## 2021-08-13 DIAGNOSIS — Q828 Other specified congenital malformations of skin: Secondary | ICD-10-CM | POA: Diagnosis not present

## 2021-08-22 NOTE — Progress Notes (Signed)
?  Subjective:  ?Patient ID: Amanda Cordova, female    DOB: 1960/08/13,  MRN: [id number] ? ?Amanda Cordova presents to clinic today for at risk foot care with history of peripheral neuropathy and thick, elongated toenails right great toe which are tender when wearing enclosed shoe gear. ? ?She states lesion has regrown on the posterior aspect of her right heel. ? ?New problem(s): None.  ? ?PCP is [name], [name], MD , and last visit was June 11, 2021. ? ?Allergies  ?Allergen Reactions  ? Gabapentin Anaphylaxis and Swelling  ?  Throat and tongue swells ?Throat and tongue swells ?Other reaction(s): eyes swelled, throat, hands,  ? Tramadol Swelling  ?  Throat and hands  ? Crestor [Rosuvastatin] Other (See Comments)  ? Duloxetine Nausea And Vomiting  ? Lipitor [Atorvastatin]   ?  myalgias  ? Pravastatin   ?  myalgias  ? Tramadol Hcl   ?  Other reaction(s): nausea/dizzy/blurry vision/hands tingling  ? Zetia [Ezetimibe]   ?  myalgias  ? Lisinopril Nausea Only  ? ? ?Review of Systems: Negative except as noted in the HPI. ? ?Objective: No changes noted in today's physical examination. ? ?Vascular Examination: ?CFT immediate b/l LE. Palpable DP/PT pulses b/l LE. Digital hair sparse b/l. Skin temperature gradient WNL b/l. No pain with calf compression b/l. No edema noted b/l. No cyanosis or clubbing noted b/l LE. ? ?Neurological Examination: ?Pt has subjective symptoms of neuropathy. Protective sensation intact 5/5 intact bilaterally with 10g monofilament b/l. ? ?Dermatological Examination: ?Pedal integument with normal turgor, texture and tone BLE. No open wounds b/l LE. No interdigital macerations noted b/l LE. Toenails bilateral great toes elongated, discolored, dystrophic, thickened, and crumbly with subungual debris and tenderness to dorsal palpation. Hyperkeratotic lesion(s) posterolateral aspect of right heel.  No erythema, no edema, no drainage, no fluctuance. ? ?Musculoskeletal Examination: ?Muscle  strength 5/5 to b/l LE. Muscle strength 5/5 to all LE muscle groups of b/l lower extremities. No gross bony deformities bilaterally. ? ?Radiographs: None ? ? ?  Latest Ref Rng & Units 12/23/2020  ?  3:23 PM  ?Hemoglobin A1C  ?Hemoglobin-A1c 4.0 - 5.6 % 5.6    ? ?Assessment/Plan: ?1. Nail dystrophy   ?2. Porokeratosis   ?3. Neuropathy of both feet   ?-Patient was evaluated and treated. All patient's and/or POA's questions/concerns answered on today's visit. ?-Toenails right great toe debrided in length and girth without iatrogenic bleeding with sterile nail nipper and dremel.  ?-Callus(es) posterior aspect of right heel pared utilizing mandrel sander without complication or incident. Total number debrided =1. ?-Patient/POA to call should there be question/concern in the interim.  ? ?Return in about 3 months (around 11/12/2021). ? ?Marzetta Board, DPM  ?

## 2021-08-24 DIAGNOSIS — Z9889 Other specified postprocedural states: Secondary | ICD-10-CM | POA: Diagnosis not present

## 2021-08-24 DIAGNOSIS — Z8739 Personal history of other diseases of the musculoskeletal system and connective tissue: Secondary | ICD-10-CM | POA: Diagnosis not present

## 2021-08-24 DIAGNOSIS — R490 Dysphonia: Secondary | ICD-10-CM | POA: Diagnosis not present

## 2021-08-24 DIAGNOSIS — Z8669 Personal history of other diseases of the nervous system and sense organs: Secondary | ICD-10-CM | POA: Diagnosis not present

## 2021-08-24 DIAGNOSIS — Z87891 Personal history of nicotine dependence: Secondary | ICD-10-CM | POA: Diagnosis not present

## 2021-08-24 DIAGNOSIS — R1310 Dysphagia, unspecified: Secondary | ICD-10-CM | POA: Diagnosis not present

## 2021-08-24 DIAGNOSIS — J3489 Other specified disorders of nose and nasal sinuses: Secondary | ICD-10-CM | POA: Diagnosis not present

## 2021-08-24 DIAGNOSIS — H9192 Unspecified hearing loss, left ear: Secondary | ICD-10-CM | POA: Diagnosis not present

## 2021-09-07 DIAGNOSIS — D49512 Neoplasm of unspecified behavior of left kidney: Secondary | ICD-10-CM | POA: Diagnosis not present

## 2021-09-08 ENCOUNTER — Other Ambulatory Visit: Payer: Self-pay | Admitting: Family Medicine

## 2021-09-09 NOTE — Telephone Encounter (Signed)
Last refill per controlled substance database: 08/09/21 Last OV: 06/11/21 Next OV: 12/10/21

## 2021-09-10 ENCOUNTER — Other Ambulatory Visit: Payer: Self-pay

## 2021-09-10 ENCOUNTER — Encounter (HOSPITAL_COMMUNITY)
Admission: RE | Admit: 2021-09-10 | Discharge: 2021-09-10 | Disposition: A | Discharge status: Home / Self Care | Payer: Medicare HMO | Source: Ambulatory Visit | Attending: Urology | Admitting: Urology

## 2021-09-10 ENCOUNTER — Encounter (HOSPITAL_COMMUNITY): Payer: Self-pay

## 2021-09-10 VITALS — BP 175/93 | HR 57 | Temp 97.7°F | Ht 64.0 in | Wt 238.0 lb

## 2021-09-10 DIAGNOSIS — R7303 Prediabetes: Secondary | ICD-10-CM | POA: Diagnosis not present

## 2021-09-10 DIAGNOSIS — I10 Essential (primary) hypertension: Secondary | ICD-10-CM | POA: Diagnosis not present

## 2021-09-10 DIAGNOSIS — Z01812 Encounter for preprocedural laboratory examination: Secondary | ICD-10-CM | POA: Diagnosis not present

## 2021-09-10 HISTORY — DX: Dyspnea, unspecified: R06.00

## 2021-09-10 HISTORY — DX: Prediabetes: R73.03

## 2021-09-10 HISTORY — DX: Chronic kidney disease, unspecified: N18.9

## 2021-09-10 LAB — CBC
HCT: 49 % — ABNORMAL HIGH (ref 36.0–46.0)
Hemoglobin: 15.8 g/dL — ABNORMAL HIGH (ref 12.0–15.0)
MCH: 30 pg (ref 26.0–34.0)
MCHC: 32.2 g/dL (ref 30.0–36.0)
MCV: 93.2 fL (ref 80.0–100.0)
Platelets: 287 10*3/uL (ref 150–400)
RBC: 5.26 MIL/uL — ABNORMAL HIGH (ref 3.87–5.11)
RDW: 13.6 % (ref 11.5–15.5)
WBC: 10 10*3/uL (ref 4.0–10.5)
nRBC: 0 % (ref 0.0–0.2)

## 2021-09-10 LAB — TYPE AND SCREEN
ABO/RH(D): A POS
Antibody Screen: NEGATIVE

## 2021-09-10 LAB — BASIC METABOLIC PANEL
Anion gap: 5 (ref 5–15)
BUN: 10 mg/dL (ref 8–23)
CO2: 27 mmol/L (ref 22–32)
Calcium: 9.3 mg/dL (ref 8.9–10.3)
Chloride: 112 mmol/L — ABNORMAL HIGH (ref 98–111)
Creatinine, Ser: 0.71 mg/dL (ref 0.44–1.00)
GFR, Estimated: >60 mL/min (ref >60)
Glucose, Bld: 117 mg/dL — ABNORMAL HIGH (ref 70–99)
Potassium: 3.7 mmol/L (ref 3.5–5.1)
Sodium: 144 mmol/L (ref 135–145)

## 2021-09-10 LAB — HEMOGLOBIN A1C
Hgb A1c MFr Bld: 5.5 % (ref 4.8–5.6)
Mean Plasma Glucose: 111.15 mg/dL

## 2021-09-10 LAB — GLUCOSE, CAPILLARY: Glucose-Capillary: 114 mg/dL — ABNORMAL HIGH (ref 70–99)

## 2021-09-10 NOTE — Progress Notes (Signed)
For Short Stay: [place] appointment date: Date of COVID positive in last 34 days:  Bowel Prep reminder:   For Anesthesia: PCP - Dr. Betty [name] Cardiologist - Dr. Kirk Ruths. LOV: 06/01/21  Chest x-ray - 07/20/21 EKG - 11/30/20 Stress Test -  ECHO - 03/05/18 Cardiac Cath -  Pacemaker/ICD device last checked: Pacemaker orders received: Device Rep notified:  Spinal Cord Stimulator:  Sleep Study - Yes CPAP - Yes  Fasting Blood Sugar -  Checks Blood Sugar _____ times a day Date and result of last Hgb A1c-  Blood Thinner Instructions: Aspirin Instructions: Last Dose:  Activity level: Can go up a flight of stairs and activities of daily living without stopping and without chest pain and/or shortness of breath   Able to exercise without chest pain and/or shortness of breath   Unable to go up a flight of stairs without chest pain and/or shortness of breath     Anesthesia review: Hx: HTN,CAD,COPD,OSA(CPAP)  Patient denies shortness of breath, fever, cough and chest pain at PAT appointment   Patient verbalized understanding of instructions that were given to them at the PAT appointment. Patient was also instructed that they will need to review over the PAT instructions again at home before surgery.

## 2021-09-10 NOTE — Patient Instructions (Signed)
DUE TO COVID-19 ONLY TWO VISITORS  (aged 63 and older)  ARE ALLOWED TO COME WITH YOU AND STAY IN THE WAITING ROOM ONLY DURING PRE OP AND PROCEDURE.   **NO VISITORS ARE ALLOWED IN THE SHORT STAY AREA OR RECOVERY ROOM!!**  IF YOU WILL BE ADMITTED INTO THE HOSPITAL YOU ARE ALLOWED ONLY FOUR SUPPORT PEOPLE DURING VISITATION HOURS ONLY (7 AM -8PM)   The support person(s) must pass our screening, gel in and out, and wear a mask at all times, including in the patient's room. Patients must also wear a mask when staff or their support person are in the room. Visitors GUEST BADGE MUST BE WORN VISIBLY  One adult visitor may remain with you overnight and MUST be in the room by 8 P.M.     Your procedure is scheduled on: 09/21/21   Report to [facility] Main Entrance    Report to Short stay at : 5:15 AM   Call this number if you have problems the morning of surgery [phone]   Do not eat food :After Midnight.   After Midnight you may have the following liquids until: 4:15 AM DAY OF SURGERY  Water Black Coffee (sugar ok, NO MILK/CREAM OR CREAMERS)  Tea (sugar ok, NO MILK/CREAM OR CREAMERS) regular and decaf                             Plain Jell-O (NO RED)                                           Fruit ices (not with fruit pulp, NO RED)                                     Popsicles (NO RED)                                                                  Juice: apple, WHITE grape, WHITE cranberry Sports drinks like Gatorade (NO RED) Clear broth(vegetable,chicken,beef)        Oral Hygiene is also important to reduce your risk of infection.                                    Remember - BRUSH YOUR TEETH THE MORNING OF SURGERY WITH YOUR REGULAR TOOTHPASTE   Do NOT smoke after Midnight   Take these medicines the morning of surgery with A SIP OF WATER: amlodipine.Use inhalers as usual.Alprazolam as needed.  DO NOT TAKE ANY ORAL DIABETIC MEDICATIONS DAY OF YOUR SURGERY  Bring CPAP mask  and tubing day of surgery.                              You may not have any metal on your body including hair pins, jewelry, and body piercing             Do not wear make-up, lotions, powders,  perfumes/cologne, or deodorant  Do not wear nail polish including gel and S&S, artificial/acrylic nails, or any other type of covering on natural nails including finger and toenails. If you have artificial nails, gel coating, etc. that needs to be removed by a nail salon please have this removed prior to surgery or surgery may need to be canceled/ delayed if the surgeon/ anesthesia feels like they are unable to be safely monitored.   Do not shave  48 hours prior to surgery.    Do not bring valuables to the hospital. [place].   Contacts, dentures or bridgework may not be worn into surgery.   Bring small overnight bag day of surgery.    Patients discharged on the day of surgery will not be allowed to drive home.  Someone NEEDS to stay with you for the first 24 hours after anesthesia.   Special Instructions: Bring a copy of your healthcare power of attorney and living will documents         the day of surgery if you haven't scanned them before.              Please read over the following fact sheets you were given: IF YOU HAVE QUESTIONS ABOUT YOUR PRE-OP INSTRUCTIONS PLEASE CALL [phone]     [facility] - Preparing for Surgery Before surgery, you can play an important role.  Because skin is not sterile, your skin needs to be as free of germs as possible.  You can reduce the number of germs on your skin by washing with CHG (chlorahexidine gluconate) soap before surgery.  CHG is an antiseptic cleaner which kills germs and bonds with the skin to continue killing germs even after washing. Please DO NOT use if you have an allergy to CHG or antibacterial soaps.  If your skin becomes reddened/irritated stop using the CHG and inform your nurse when you  arrive at Short Stay. Do not shave (including legs and underarms) for at least 48 hours prior to the first CHG shower.  You may shave your face/neck. Please follow these instructions carefully:  1.  Shower with CHG Soap the night before surgery and the  morning of Surgery.  2.  If you choose to wash your hair, wash your hair first as usual with your  normal  shampoo.  3.  After you shampoo, rinse your hair and body thoroughly to remove the  shampoo.                           4.  Use CHG as you would any other liquid soap.  You can apply chg directly  to the skin and wash                       Gently with a scrungie or clean washcloth.  5.  Apply the CHG Soap to your body ONLY FROM THE NECK DOWN.   Do not use on face/ open                           Wound or open sores. Avoid contact with eyes, ears mouth and genitals (private parts).                       Wash face,  Development worker, international aid (private  parts) with your normal soap.             6.  Wash thoroughly, paying special attention to the area where your surgery  will be performed.  7.  Thoroughly rinse your body with warm water from the neck down.  8.  DO NOT shower/wash with your normal soap after using and rinsing off  the CHG Soap.                9.  Pat yourself dry with a clean towel.            10.  Wear clean pajamas.            11.  Place clean sheets on your bed the night of your first shower and do not  sleep with pets. Day of Surgery : Do not apply any lotions/deodorants the morning of surgery.  Please wear clean clothes to the hospital/surgery center.  FAILURE TO FOLLOW THESE INSTRUCTIONS MAY RESULT IN THE CANCELLATION OF YOUR SURGERY PATIENT SIGNATURE_________________________________  NURSE SIGNATURE__________________________________  ________________________________________________________________________  WHAT IS A BLOOD TRANSFUSION? Blood Transfusion Information  A transfusion is the replacement of blood or some of its parts. Blood is  made up of multiple cells which provide different functions. Red blood cells carry oxygen and are used for blood loss replacement. White blood cells fight against infection. Platelets control bleeding. Plasma helps clot blood. Other blood products are available for specialized needs, such as hemophilia or other clotting disorders. BEFORE THE TRANSFUSION  Who gives blood for transfusions?  Healthy volunteers who are fully evaluated to make sure their blood is safe. This is blood bank blood. Transfusion therapy is the safest it has ever been in the practice of medicine. Before blood is taken from a donor, a complete history is taken to make sure that person has no history of diseases nor engages in risky social behavior (examples are intravenous drug use or sexual activity with multiple partners). The donor's travel history is screened to minimize risk of transmitting infections, such as malaria. The donated blood is tested for signs of infectious diseases, such as HIV and hepatitis. The blood is then tested to be sure it is compatible with you in order to minimize the chance of a transfusion reaction. If you or a relative donates blood, this is often done in anticipation of surgery and is not appropriate for emergency situations. It takes many days to process the donated blood. RISKS AND COMPLICATIONS Although transfusion therapy is very safe and saves many lives, the main dangers of transfusion include:  Getting an infectious disease. Developing a transfusion reaction. This is an allergic reaction to something in the blood you were given. Every precaution is taken to prevent this. The decision to have a blood transfusion has been considered carefully by your caregiver before blood is given. Blood is not given unless the benefits outweigh the risks. AFTER THE TRANSFUSION Right after receiving a blood transfusion, you will usually feel much better and more energetic. This is especially true if your red  blood cells have gotten low (anemic). The transfusion raises the level of the red blood cells which carry oxygen, and this usually causes an energy increase. The nurse administering the transfusion will monitor you carefully for complications. HOME CARE INSTRUCTIONS  No special instructions are needed after a transfusion. You may find your energy is better. Speak with your caregiver about any limitations on activity for underlying diseases you may have. SEEK MEDICAL CARE IF:  Your  condition is not improving after your transfusion. You develop redness or irritation at the intravenous (IV) site. SEEK IMMEDIATE MEDICAL CARE IF:  Any of the following symptoms occur over the next 12 hours: Shaking chills. You have a temperature by mouth above 102 F (38.9 C), not controlled by medicine. Chest, back, or muscle pain. People around you feel you are not acting correctly or are confused. Shortness of breath or difficulty breathing. Dizziness and fainting. You get a rash or develop hives. You have a decrease in urine output. Your urine turns a dark color or changes to pink, red, or brown. Any of the following symptoms occur over the next 10 days: You have a temperature by mouth above 102 F (38.9 C), not controlled by medicine. Shortness of breath. Weakness after normal activity. The white part of the eye turns yellow (jaundice). You have a decrease in the amount of urine or are urinating less often. Your urine turns a dark color or changes to pink, red, or brown. Document Released: 04/01/2000 Document Revised: 06/27/2011 Document Reviewed: 11/19/2007 [facility] Patient Information 2014 [place], [place].  _______________________________________________________________________

## 2021-09-14 ENCOUNTER — Telehealth: Payer: Self-pay | Admitting: Family Medicine

## 2021-09-14 NOTE — Telephone Encounter (Signed)
Duplicate request, Rx already on provider's desktop to be approved.

## 2021-09-14 NOTE — Telephone Encounter (Signed)
Pt requesting refill ALPRAZolam (XANAX) 0.25 MG tablet   [id number] - [place], [place] [address] AT [place] Phone:  [phone]  Fax:  [phone]

## 2021-09-20 NOTE — Anesthesia Preprocedure Evaluation (Signed)
Anesthesia Evaluation  Patient identified by MRN, date of birth, ID band Patient awake    Reviewed: Allergy & Precautions, NPO status , Patient's Chart, lab work & pertinent test results  History of Anesthesia Complications Negative for: history of anesthetic complications  Airway Mallampati: III  TM Distance: >3 FB Neck ROM: Full    Dental no notable dental hx. (+) Dental Advisory Given   Pulmonary sleep apnea and Continuous Positive Airway Pressure Ventilation , COPD,  COPD inhaler, former smoker,    Pulmonary exam normal        Cardiovascular hypertension, Pt. on medications CAD: on ASA.  Normal cardiovascular exam  9/22 perfusion study .  The study is normal. The study is low risk. .  No ST deviation was noted. .  LV perfusion is normal. There is no evidence of ischemia. There is no evidence of infarction. .  Left ventricular function is normal. Nuclear stress EF: 58 %. The left ventricular ejection fraction is normal (55-65%). End diastolic cavity size is normal. .  Prior study available for comparison from 03/08/2018. Previous apical attenuation more diaphragmatic on this study compared to 2019  Normal resting and stress perfusion. No ischemia or infarction EF 58% some diaphragmatic attenuation on resting images      EKG: 01/14/20: NSR, prolonged QT (QT 488, QTc 491 ms)   CV: Nuclear stress test 03/09/18:  The left ventricular ejection fraction is normal (55-65%).  Nuclear stress EF: 60%.  Blood pressure demonstrated a hypotensive response to exercise.  Nondiagnostic EKG due to baseline diffuse ST/T wave abnormality.  There is a small defect of mild severity present in the apical anterior location. In the setting of normal LVF this is consistent with attenuation artifact. No ischemia noted.  This is a low risk study.   Echo 03/05/18: Study Conclusions  - Left ventricle: The cavity size was normal.  There was moderate  concentric hypertrophy. Systolic function was normal. The  estimated ejection fraction was in the range of 55% to 60%. Wall  motion was normal; there were no regional wall motion  abnormalities. Left ventricular diastolic function parameters  were normal.  - Aortic valve: Trileaflet; normal thickness leaflets.  - Aortic root: The aortic root was normal in size.  - Right ventricle: Systolic function was normal.  - Right atrium: The atrium was normal in size.  - Tricuspid valve: There was mild regurgitation.  - Pulmonary arteries: Systolic pressure was within the normal  range.  - Inferior vena cava: The vessel was normal in size.  - Pericardium, extracardiac: There was no pericardial effusion.      Neuro/Psych  Neuromuscular disease (acoustic neuroma)    GI/Hepatic negative GI ROS, Neg liver ROS,   Endo/Other  Morbid obesity  Renal/GU negative Renal ROS  negative genitourinary   Musculoskeletal  (+) Arthritis ,   Abdominal   Peds  Hematology   Anesthesia Other Findings   Reproductive/Obstetrics negative OB ROS                            Anesthesia Physical  Anesthesia Plan  ASA: 3  Anesthesia Plan: General   Post-op Pain Management: Tylenol PO (pre-op)*   Induction: Intravenous  PONV Risk Score and Plan: 4 or greater and Treatment may vary due to age or medical condition, Ondansetron, Dexamethasone, Scopolamine patch - Pre-op and Midazolam  Airway Management Planned: Oral ETT  Additional Equipment: None  Intra-op Plan:   Post-operative Plan: Extubation  in OR  Informed Consent: I have reviewed the patients History and Physical, chart, labs and discussed the procedure including the risks, benefits and alternatives for the proposed anesthesia with the patient or authorized representative who has indicated his/her understanding and acceptance.     Dental advisory given  Plan Discussed with:  Anesthesiologist and CRNA  Anesthesia Plan Comments: ([place] )      Anesthesia Quick Evaluation

## 2021-09-21 ENCOUNTER — Ambulatory Visit (HOSPITAL_BASED_OUTPATIENT_CLINIC_OR_DEPARTMENT_OTHER): Payer: Medicare HMO | Admitting: Certified Registered Nurse Anesthetist

## 2021-09-21 ENCOUNTER — Ambulatory Visit (HOSPITAL_COMMUNITY): Payer: Medicare HMO | Admitting: Physician Assistant

## 2021-09-21 ENCOUNTER — Observation Stay (HOSPITAL_COMMUNITY)
Admission: RE | Admit: 2021-09-21 | Discharge: 2021-09-22 | Disposition: A | Discharge status: Home / Self Care | Payer: Medicare HMO | Attending: Urology | Admitting: Urology

## 2021-09-21 ENCOUNTER — Other Ambulatory Visit: Payer: Self-pay

## 2021-09-21 ENCOUNTER — Encounter (HOSPITAL_COMMUNITY): Payer: Self-pay | Admitting: Urology

## 2021-09-21 ENCOUNTER — Encounter (HOSPITAL_COMMUNITY)
Admission: RE | Disposition: A | Discharge status: Home / Self Care | Payer: Self-pay | Source: Home / Self Care | Attending: Urology

## 2021-09-21 DIAGNOSIS — I10 Essential (primary) hypertension: Secondary | ICD-10-CM

## 2021-09-21 DIAGNOSIS — I2510 Atherosclerotic heart disease of native coronary artery without angina pectoris: Secondary | ICD-10-CM | POA: Diagnosis not present

## 2021-09-21 DIAGNOSIS — Z79899 Other long term (current) drug therapy: Secondary | ICD-10-CM | POA: Diagnosis not present

## 2021-09-21 DIAGNOSIS — J449 Chronic obstructive pulmonary disease, unspecified: Secondary | ICD-10-CM | POA: Diagnosis not present

## 2021-09-21 DIAGNOSIS — N2889 Other specified disorders of kidney and ureter: Secondary | ICD-10-CM

## 2021-09-21 DIAGNOSIS — C642 Malignant neoplasm of left kidney, except renal pelvis: Secondary | ICD-10-CM | POA: Diagnosis not present

## 2021-09-21 HISTORY — PX: ROBOTIC ASSITED PARTIAL NEPHRECTOMY: SHX6087

## 2021-09-21 LAB — HEMOGLOBIN AND HEMATOCRIT, BLOOD
HCT: 46.6 % — ABNORMAL HIGH (ref 36.0–46.0)
Hemoglobin: 14.8 g/dL (ref 12.0–15.0)

## 2021-09-21 SURGERY — NEPHRECTOMY, PARTIAL, ROBOT-ASSISTED
Anesthesia: General | Site: Abdomen | Laterality: Left

## 2021-09-21 MED ORDER — SODIUM CHLORIDE (PF) 0.9 % IJ SOLN
INTRAMUSCULAR | Status: AC
Start: 2021-09-21 — End: ?
  Filled 2021-09-21: qty 20

## 2021-09-21 MED ORDER — PROMETHAZINE HCL 25 MG/ML IJ SOLN
6.2500 mg | INTRAMUSCULAR | Status: DC | PRN
  Administered 2021-09-21: 12.5 mg via INTRAVENOUS

## 2021-09-21 MED ORDER — BUPIVACAINE LIPOSOME 1.3 % IJ SUSP
INTRAMUSCULAR | Status: DC | PRN
  Administered 2021-09-21: 20 mL

## 2021-09-21 MED ORDER — PROMETHAZINE HCL 12.5 MG PO TABS: 12.5000 mg | ORAL_TABLET | ORAL | 0 refills | Status: DC | PRN

## 2021-09-21 MED ORDER — ROCURONIUM BROMIDE 10 MG/ML (PF) SYRINGE
PREFILLED_SYRINGE | INTRAVENOUS | Status: AC
Start: 2021-09-21 — End: ?
  Filled 2021-09-21: qty 10

## 2021-09-21 MED ORDER — MIDAZOLAM HCL 2 MG/2ML IJ SOLN
INTRAMUSCULAR | Status: AC
  Filled 2021-09-21: qty 2

## 2021-09-21 MED ORDER — ONDANSETRON HCL 4 MG/2ML IJ SOLN
INTRAMUSCULAR | Status: AC
  Filled 2021-09-21: qty 2

## 2021-09-21 MED ORDER — PROPOFOL 10 MG/ML IV BOLUS
INTRAVENOUS | Status: AC
  Filled 2021-09-21: qty 20

## 2021-09-21 MED ORDER — LACTATED RINGERS IV SOLN: INTRAVENOUS | Status: DC

## 2021-09-21 MED ORDER — ALBUMIN HUMAN 5 % IV SOLN: INTRAVENOUS | Status: DC | PRN

## 2021-09-21 MED ORDER — ALPRAZOLAM 0.25 MG PO TABS: 0.2500 mg | ORAL_TABLET | Freq: Every evening | ORAL | Status: DC | PRN

## 2021-09-21 MED ORDER — LIDOCAINE 2% (20 MG/ML) 5 ML SYRINGE
INTRAMUSCULAR | Status: DC | PRN
  Administered 2021-09-21: 100 mg via INTRAVENOUS

## 2021-09-21 MED ORDER — PROMETHAZINE HCL 25 MG/ML IJ SOLN
INTRAMUSCULAR | Status: AC
  Filled 2021-09-21: qty 1

## 2021-09-21 MED ORDER — CHLORHEXIDINE GLUCONATE 0.12 % MT SOLN
15.0000 mL | Freq: Once | OROMUCOSAL | Status: AC
  Administered 2021-09-21: 15 mL via OROMUCOSAL

## 2021-09-21 MED ORDER — CEFAZOLIN SODIUM-DEXTROSE 2-4 GM/100ML-% IV SOLN
2.0000 g | Freq: Once | INTRAVENOUS | Status: AC
  Administered 2021-09-21: 2 g via INTRAVENOUS
  Filled 2021-09-21: qty 100

## 2021-09-21 MED ORDER — POLYMYXIN B-TRIMETHOPRIM 10000-0.1 UNIT/ML-% OP SOLN
1.0000 [drp] | Freq: Four times a day (QID) | OPHTHALMIC | Status: AC
  Administered 2021-09-21 – 2021-09-22 (×4): 1 [drp] via OPHTHALMIC
  Filled 2021-09-21: qty 10

## 2021-09-21 MED ORDER — FENTANYL CITRATE (PF) 100 MCG/2ML IJ SOLN
INTRAMUSCULAR | Status: AC
  Filled 2021-09-21: qty 2

## 2021-09-21 MED ORDER — "VISTASEAL 4 ML SINGLE DOSE KIT "
4.0000 mL | PACK | Freq: Once | CUTANEOUS | Status: AC
  Administered 2021-09-21: 4 mL via TOPICAL
  Filled 2021-09-21: qty 4

## 2021-09-21 MED ORDER — HYDROCODONE-ACETAMINOPHEN 5-325 MG PO TABS: 1.0000 | ORAL_TABLET | Freq: Four times a day (QID) | ORAL | 0 refills | Status: DC | PRN

## 2021-09-21 MED ORDER — MOMETASONE FURO-FORMOTEROL FUM 200-5 MCG/ACT IN AERO
2.0000 | INHALATION_SPRAY | Freq: Two times a day (BID) | RESPIRATORY_TRACT | Status: DC
  Administered 2021-09-21 – 2021-09-22 (×2): 2 via RESPIRATORY_TRACT
  Filled 2021-09-21: qty 8.8

## 2021-09-21 MED ORDER — FENTANYL CITRATE PF 50 MCG/ML IJ SOSY
PREFILLED_SYRINGE | INTRAMUSCULAR | Status: AC
  Filled 2021-09-21: qty 1

## 2021-09-21 MED ORDER — PROPOFOL 10 MG/ML IV BOLUS
INTRAVENOUS | Status: DC | PRN
  Administered 2021-09-21: 200 mg via INTRAVENOUS

## 2021-09-21 MED ORDER — ONDANSETRON HCL 4 MG/2ML IJ SOLN
INTRAMUSCULAR | Status: DC | PRN
  Administered 2021-09-21: 4 mg via INTRAVENOUS

## 2021-09-21 MED ORDER — ALBUTEROL SULFATE (2.5 MG/3ML) 0.083% IN NEBU: 2.5000 mg | INHALATION_SOLUTION | Freq: Four times a day (QID) | RESPIRATORY_TRACT | Status: DC | PRN

## 2021-09-21 MED ORDER — HEMOSTATIC AGENTS (NO CHARGE) OPTIME
TOPICAL | Status: DC | PRN
  Administered 2021-09-21: 1 via TOPICAL

## 2021-09-21 MED ORDER — DEXAMETHASONE SODIUM PHOSPHATE 10 MG/ML IJ SOLN
INTRAMUSCULAR | Status: AC
  Filled 2021-09-21: qty 1

## 2021-09-21 MED ORDER — HYDROMORPHONE HCL 1 MG/ML IJ SOLN
0.5000 mg | INTRAMUSCULAR | Status: DC | PRN
  Administered 2021-09-21 – 2021-09-22 (×3): 1 mg via INTRAVENOUS
  Filled 2021-09-21 (×2): qty 1

## 2021-09-21 MED ORDER — BUPIVACAINE LIPOSOME 1.3 % IJ SUSP
INTRAMUSCULAR | Status: AC
  Filled 2021-09-21: qty 20

## 2021-09-21 MED ORDER — EPHEDRINE SULFATE-NACL 50-0.9 MG/10ML-% IV SOSY
PREFILLED_SYRINGE | INTRAVENOUS | Status: DC | PRN
  Administered 2021-09-21 (×2): 5 mg via INTRAVENOUS
  Administered 2021-09-21: 10 mg via INTRAVENOUS

## 2021-09-21 MED ORDER — CEFAZOLIN SODIUM-DEXTROSE 1-4 GM/50ML-% IV SOLN
1.0000 g | Freq: Three times a day (TID) | INTRAVENOUS | Status: AC
  Administered 2021-09-21 – 2021-09-22 (×2): 1 g via INTRAVENOUS
  Filled 2021-09-21 (×2): qty 50

## 2021-09-21 MED ORDER — DOCUSATE SODIUM 100 MG PO CAPS: 100.0000 mg | ORAL_CAPSULE | Freq: Two times a day (BID) | ORAL | Status: DC

## 2021-09-21 MED ORDER — AMLODIPINE BESYLATE 5 MG PO TABS
5.0000 mg | ORAL_TABLET | Freq: Every day | ORAL | Status: DC
Start: 2021-09-21 — End: 2021-09-22
  Administered 2021-09-21 – 2021-09-22 (×2): 5 mg via ORAL
  Filled 2021-09-21 (×2): qty 1

## 2021-09-21 MED ORDER — ROCURONIUM BROMIDE 10 MG/ML (PF) SYRINGE
PREFILLED_SYRINGE | INTRAVENOUS | Status: AC
  Filled 2021-09-21: qty 20

## 2021-09-21 MED ORDER — DOCUSATE SODIUM 100 MG PO CAPS
100.0000 mg | ORAL_CAPSULE | Freq: Two times a day (BID) | ORAL | Status: DC
  Administered 2021-09-21 – 2021-09-22 (×2): 100 mg via ORAL
  Filled 2021-09-21 (×2): qty 1

## 2021-09-21 MED ORDER — LIDOCAINE HCL (PF) 2 % IJ SOLN
INTRAMUSCULAR | Status: DC | PRN
  Administered 2021-09-21: 1.5 mg/kg/h via INTRADERMAL

## 2021-09-21 MED ORDER — DEXAMETHASONE SODIUM PHOSPHATE 10 MG/ML IJ SOLN
INTRAMUSCULAR | Status: DC | PRN
  Administered 2021-09-21: 5 mg via INTRAVENOUS

## 2021-09-21 MED ORDER — HYDROMORPHONE HCL 1 MG/ML IJ SOLN
INTRAMUSCULAR | Status: AC
  Filled 2021-09-21: qty 1

## 2021-09-21 MED ORDER — ACETAMINOPHEN 325 MG PO TABS
650.0000 mg | ORAL_TABLET | ORAL | Status: DC | PRN
  Filled 2021-09-21: qty 2

## 2021-09-21 MED ORDER — BSS IO SOLN
15.0000 mL | Freq: Once | INTRAOCULAR | Status: AC
  Administered 2021-09-21: 15 mL
  Filled 2021-09-21: qty 15

## 2021-09-21 MED ORDER — ROCURONIUM BROMIDE 10 MG/ML (PF) SYRINGE
PREFILLED_SYRINGE | INTRAVENOUS | Status: DC | PRN
  Administered 2021-09-21: 100 mg via INTRAVENOUS
  Administered 2021-09-21: 20 mg via INTRAVENOUS

## 2021-09-21 MED ORDER — FENTANYL CITRATE PF 50 MCG/ML IJ SOSY
25.0000 ug | PREFILLED_SYRINGE | INTRAMUSCULAR | Status: DC | PRN
  Administered 2021-09-21: 25 ug via INTRAVENOUS
  Administered 2021-09-21 (×2): 50 ug via INTRAVENOUS

## 2021-09-21 MED ORDER — DIPHENHYDRAMINE HCL 12.5 MG/5ML PO ELIX: 12.5000 mg | ORAL_SOLUTION | Freq: Four times a day (QID) | ORAL | Status: DC | PRN

## 2021-09-21 MED ORDER — SCOPOLAMINE 1 MG/3DAYS TD PT72
1.0000 | MEDICATED_PATCH | TRANSDERMAL | Status: DC
  Administered 2021-09-21: 1.5 mg via TRANSDERMAL
  Filled 2021-09-21: qty 1

## 2021-09-21 MED ORDER — LIDOCAINE HCL (PF) 2 % IJ SOLN
INTRAMUSCULAR | Status: AC
  Filled 2021-09-21: qty 5

## 2021-09-21 MED ORDER — ONDANSETRON HCL 4 MG/2ML IJ SOLN: 4.0000 mg | INTRAMUSCULAR | Status: DC | PRN

## 2021-09-21 MED ORDER — KETAMINE HCL 50 MG/5ML IJ SOSY
PREFILLED_SYRINGE | INTRAMUSCULAR | Status: AC
  Filled 2021-09-21: qty 5

## 2021-09-21 MED ORDER — SODIUM CHLORIDE (PF) 0.9 % IJ SOLN
INTRAMUSCULAR | Status: DC | PRN
  Administered 2021-09-21: 20 mL

## 2021-09-21 MED ORDER — ACETAMINOPHEN 500 MG PO TABS
1000.0000 mg | ORAL_TABLET | Freq: Once | ORAL | Status: AC
Start: 2021-09-21 — End: 2021-09-21
  Administered 2021-09-21: 1000 mg via ORAL
  Filled 2021-09-21: qty 2

## 2021-09-21 MED ORDER — HYOSCYAMINE SULFATE 0.125 MG SL SUBL: 0.1250 mg | SUBLINGUAL_TABLET | SUBLINGUAL | Status: DC | PRN

## 2021-09-21 MED ORDER — LIDOCAINE HCL 2 % IJ SOLN
INTRAMUSCULAR | Status: AC
Start: 2021-09-21 — End: ?
  Filled 2021-09-21: qty 20

## 2021-09-21 MED ORDER — LACTATED RINGERS IR SOLN
Status: DC | PRN
  Administered 2021-09-21: 1000 mL

## 2021-09-21 MED ORDER — MIDAZOLAM HCL 5 MG/5ML IJ SOLN
INTRAMUSCULAR | Status: DC | PRN
  Administered 2021-09-21: 2 mg via INTRAVENOUS

## 2021-09-21 MED ORDER — DIPHENHYDRAMINE HCL 50 MG/ML IJ SOLN: 12.5000 mg | Freq: Four times a day (QID) | INTRAMUSCULAR | Status: DC | PRN

## 2021-09-21 MED ORDER — OXYCODONE HCL 5 MG PO TABS
5.0000 mg | ORAL_TABLET | ORAL | Status: DC | PRN
  Administered 2021-09-21 – 2021-09-22 (×3): 5 mg via ORAL
  Filled 2021-09-21 (×3): qty 1

## 2021-09-21 MED ORDER — STERILE WATER FOR IRRIGATION IR SOLN
Status: DC | PRN
  Administered 2021-09-21: 1000 mL

## 2021-09-21 MED ORDER — KETAMINE HCL 10 MG/ML IJ SOLN
INTRAMUSCULAR | Status: DC | PRN
  Administered 2021-09-21: 30 mg via INTRAVENOUS

## 2021-09-21 MED ORDER — FENTANYL CITRATE (PF) 100 MCG/2ML IJ SOLN
INTRAMUSCULAR | Status: DC | PRN
  Administered 2021-09-21: 100 ug via INTRAVENOUS

## 2021-09-21 MED ORDER — ORAL CARE MOUTH RINSE: 15.0000 mL | Freq: Once | OROMUCOSAL | Status: AC

## 2021-09-21 MED ORDER — DEXTROSE-NACL 5-0.45 % IV SOLN: INTRAVENOUS | Status: DC

## 2021-09-21 MED ORDER — AMISULPRIDE (ANTIEMETIC) 5 MG/2ML IV SOLN: 10.0000 mg | Freq: Once | INTRAVENOUS | Status: DC | PRN

## 2021-09-21 MED ORDER — KETOROLAC TROMETHAMINE 0.5 % OP SOLN
1.0000 [drp] | Freq: Four times a day (QID) | OPHTHALMIC | Status: AC
  Administered 2021-09-21 – 2021-09-22 (×4): 1 [drp] via OPHTHALMIC
  Filled 2021-09-21: qty 5

## 2021-09-21 MED ORDER — ALBUMIN HUMAN 5 % IV SOLN
INTRAVENOUS | Status: AC
Start: 2021-09-21 — End: ?
  Filled 2021-09-21: qty 250

## 2021-09-21 SURGICAL SUPPLY — 76 items
APPLICATOR VISTASEAL 35 (MISCELLANEOUS) ×2 IMPLANT
BAG COUNTER SPONGE SURGICOUNT (BAG) IMPLANT
BAG RETRIEVAL 10 (BASKET) ×1
CHLORAPREP W/TINT 26 (MISCELLANEOUS) ×3 IMPLANT
CLIP LIGATING HEM O LOK PURPLE (MISCELLANEOUS) ×2 IMPLANT
CLIP LIGATING HEMO LOK XL GOLD (MISCELLANEOUS) IMPLANT
CLIP LIGATING HEMO O LOK GREEN (MISCELLANEOUS) ×4 IMPLANT
CLIP SUT LAPRA TY ABSORB (SUTURE) ×4 IMPLANT
COVER SURGICAL LIGHT HANDLE (MISCELLANEOUS) ×2 IMPLANT
COVER TIP SHEARS 8 DVNC (MISCELLANEOUS) ×1 IMPLANT
COVER TIP SHEARS 8MM DA VINCI (MISCELLANEOUS) ×1
CUTTER ECHEON FLEX ENDO 45 340 (ENDOMECHANICALS) IMPLANT
DERMABOND ADVANCED (GAUZE/BANDAGES/DRESSINGS) ×1
DERMABOND ADVANCED .7 DNX12 (GAUZE/BANDAGES/DRESSINGS) ×1 IMPLANT
DRAIN CHANNEL 15F RND FF 3/16 (WOUND CARE) IMPLANT
DRAPE ARM DVNC X/XI (DISPOSABLE) ×4 IMPLANT
DRAPE COLUMN DVNC XI (DISPOSABLE) ×1 IMPLANT
DRAPE DA VINCI XI ARM (DISPOSABLE) ×4
DRAPE DA VINCI XI COLUMN (DISPOSABLE) ×1
DRAPE INCISE IOBAN 66X45 STRL (DRAPES) ×2 IMPLANT
DRAPE SHEET LG 3/4 BI-LAMINATE (DRAPES) ×2 IMPLANT
ELECT PENCIL ROCKER SW 15FT (MISCELLANEOUS) ×2 IMPLANT
ELECT REM PT RETURN 15FT ADLT (MISCELLANEOUS) ×2 IMPLANT
EVACUATOR SILICONE 100CC (DRAIN) IMPLANT
GAUZE 4X4 16PLY ~~LOC~~+RFID DBL (SPONGE) ×2 IMPLANT
GLOVE BIO SURGEON STRL SZ 6.5 (GLOVE) ×2 IMPLANT
GLOVE BIOGEL PI IND STRL 8 (GLOVE) ×2 IMPLANT
GLOVE BIOGEL PI INDICATOR 8 (GLOVE) ×2
GLOVE SURG LX 7.5 STRW (GLOVE) ×2
GLOVE SURG LX STRL 7.5 STRW (GLOVE) ×2 IMPLANT
GOWN STRL REUS W/ TWL LRG LVL3 (GOWN DISPOSABLE) ×1 IMPLANT
GOWN STRL REUS W/ TWL XL LVL3 (GOWN DISPOSABLE) ×2 IMPLANT
GOWN STRL REUS W/TWL LRG LVL3 (GOWN DISPOSABLE) ×1
GOWN STRL REUS W/TWL XL LVL3 (GOWN DISPOSABLE) ×2
HEMOSTAT SURGICEL 4X8 (HEMOSTASIS) ×2 IMPLANT
HOLDER FOLEY CATH W/STRAP (MISCELLANEOUS) ×2 IMPLANT
IRRIG SUCT STRYKERFLOW 2 WTIP (MISCELLANEOUS) ×2
IRRIGATION SUCT STRKRFLW 2 WTP (MISCELLANEOUS) ×1 IMPLANT
KIT BASIN OR (CUSTOM PROCEDURE TRAY) ×2 IMPLANT
KIT TURNOVER KIT A (KITS) IMPLANT
LOOP VESSEL MAXI BLUE (MISCELLANEOUS) ×1 IMPLANT
MARKER SKIN DUAL TIP RULER LAB (MISCELLANEOUS) ×2 IMPLANT
NDL INSUFFLATION 14GA 120MM (NEEDLE) ×1 IMPLANT
NEEDLE INSUFFLATION 14GA 120MM (NEEDLE) ×2 IMPLANT
PROTECTOR NERVE ULNAR (MISCELLANEOUS) ×4 IMPLANT
RELOAD STAPLE 45 2.6 WHT THIN (STAPLE) IMPLANT
SCISSORS LAP 5X45 EPIX DISP (ENDOMECHANICALS) ×2 IMPLANT
SEAL CANN UNIV 5-8 DVNC XI (MISCELLANEOUS) ×4 IMPLANT
SEAL XI 5MM-8MM UNIVERSAL (MISCELLANEOUS) ×4
SEALANT SURGICAL APPL DUAL CAN (MISCELLANEOUS) IMPLANT
SET TUBE SMOKE EVAC HIGH FLOW (TUBING) ×2 IMPLANT
SOLUTION ELECTROLUBE (MISCELLANEOUS) ×2 IMPLANT
SPIKE FLUID TRANSFER (MISCELLANEOUS) ×2 IMPLANT
STAPLE RELOAD 45 WHT (STAPLE) IMPLANT
STAPLE RELOAD 45MM WHITE (STAPLE)
SUT ETHILON 2 0 PSLX (SUTURE) IMPLANT
SUT MNCRL AB 4-0 PS2 18 (SUTURE) ×4 IMPLANT
SUT PDS AB 0 CT1 36 (SUTURE) IMPLANT
SUT V-LOC BARB 180 2/0GR6 GS22 (SUTURE)
SUT VIC AB 1 CT1 36 (SUTURE) ×8 IMPLANT
SUT VIC AB 2-0 SH 27 (SUTURE) ×1
SUT VIC AB 2-0 SH 27X BRD (SUTURE) IMPLANT
SUT VICRYL 0 UR6 27IN ABS (SUTURE) IMPLANT
SUT VLOC BARB 180 ABS3/0GR12 (SUTURE) ×4
SUTURE V-LC BRB 180 2/0GR6GS22 (SUTURE) IMPLANT
SUTURE VLOC BRB 180 ABS3/0GR12 (SUTURE) ×2 IMPLANT
SYS BAG RETRIEVAL 10MM (BASKET) ×1
SYSTEM BAG RETRIEVAL 10MM (BASKET) ×1 IMPLANT
TOWEL OR 17X26 10 PK STRL BLUE (TOWEL DISPOSABLE) ×2 IMPLANT
TOWEL OR NON WOVEN STRL DISP B (DISPOSABLE) ×2 IMPLANT
TRAY FOLEY MTR SLVR 16FR STAT (SET/KITS/TRAYS/PACK) ×2 IMPLANT
TRAY LAPAROSCOPIC (CUSTOM PROCEDURE TRAY) ×2 IMPLANT
TROCAR KII 12X100 BLADELESS (ENDOMECHANICALS) ×2 IMPLANT
TROCAR UNIVERSAL OPT 12M 100M (ENDOMECHANICALS) IMPLANT
TROCAR Z-THREAD OPTICAL 5X100M (TROCAR) IMPLANT
WATER STERILE IRR 1000ML POUR (IV SOLUTION) ×2 IMPLANT

## 2021-09-21 NOTE — Anesthesia Postprocedure Evaluation (Addendum)
Anesthesia Post Note  Patient: Amanda Cordova  Procedure(s) Performed: XI ROBOTIC ASSITED PARTIAL NEPHRECTOMY (Left: Abdomen)     Patient location during evaluation: PACU Anesthesia Type: General Level of consciousness: sedated Pain management: pain level controlled Vital Signs Assessment: post-procedure vital signs reviewed and stable Respiratory status: spontaneous breathing and respiratory function stable Cardiovascular status: stable Postop Assessment: no apparent nausea or vomiting Anesthetic complications: yes Comments: Called to PACU for pain in left eye: vision normal, no FB seen, sclera injected.  Possible corneal abraision.  Orders placed.   No notable events documented.  Last Vitals:  Vitals:   09/21/21 1115 09/21/21 1130  BP: (!) 158/72 (!) 143/87  Pulse: 60 (!) 48  Resp: 18 11  Temp:    SpO2: 95% 95%    Last Pain:  Vitals:   09/21/21 1130  TempSrc:   PainSc: Asleep                 Maylene Crocker DANIEL

## 2021-09-21 NOTE — Transfer of Care (Signed)
Immediate Anesthesia Transfer of Care Note  Patient: Amanda Cordova  Procedure(s) Performed: Procedure(s): XI ROBOTIC ASSITED PARTIAL NEPHRECTOMY (Left)  Patient Location: PACU  Anesthesia Type:General  Level of Consciousness: Alert, Awake, Oriented  Airway & Oxygen Therapy: Patient Spontanous Breathing  Post-op Assessment: Report given to RN  Post vital signs: Reviewed and stable  Last Vitals:  Vitals:   09/21/21 0600  BP: (!) 160/82  Pulse: 71  Resp: 16  Temp: 36.9 C  SpO2: 28%    Complications: No apparent anesthesia complications

## 2021-09-21 NOTE — Plan of Care (Signed)
  Problem: Clinical Measurements: Goal: Postoperative complications will be avoided or minimized Outcome: Progressing   Problem: Urinary Elimination: Goal: Ability to avoid or minimize complications of infection will improve Outcome: Progressing   Problem: Health Behavior/Discharge Planning: Goal: Ability to manage health-related needs will improve Outcome: Progressing   Problem: Pain Managment: Goal: General experience of comfort will improve Outcome: Progressing   Problem: Safety: Goal: Ability to remain free from injury will improve Outcome: Progressing

## 2021-09-21 NOTE — H&P (Signed)
Office Visit Report     09/07/2021   --------------------------------------------------------------------------------   Amanda Cordova  MRN: [id number]  DOB: June 20, 1960, 61 year old Female  SSN:    PRIMARY CARE:  Betty [name], MD  REFERRING:  Glena Norfolk. Lovena Neighbours, MD  PROVIDER:  Ellison Hughs, M.D.  TREATING:  Daine Gravel, NP  LOCATION:  [facility] [phone]     --------------------------------------------------------------------------------   CC/HPI: Left renal mass   Amanda Cordova is a 61 year old female with a 2.6 cm solid and enhancing left renal mass with features concerning for renal cell carcinoma. The mass was initially discovered incidentally on MRI of the lumbar spine on 06/13/2021 and further characterized by CT abdomen with and without from 07/14/2021.   -Previously smoked 1 ppd for 35 years (quit in 2019)  -Normal right kidney  -Baseline serum creatinine approximately 0.8 with GFR greater than 60  -Hx of acoustic neuroma s/p exicsion in 2011  -Hx of open cholecystectomy and hysterectomy  -c/o worsening urgency and nocturia for the past 3-4 months--no prior OAB treatment  -No prior history of GU surgery/trauma or UTIs   09/07/2021: 61 year old female who presents today for preoperative appointment prior to undergoing a partial left nephrectomy due to a enhancing left renal mass. This surgery is scheduled for 09/21/2021 and will be completed by Dr. Lovena Neighbours. She is doing well at this time but is experiencing occasional left-sided flank pain although she believes that may be psychosomatic. She denies fevers, chills, chest pain, shortness of breath. She has a few questions regarding her upcoming surgery but most of it is about her diagnosis and what the pathology report will reveal. She has had no changes to her medications and has had no new allergies.     ALLERGIES: Duloxetine ezetimibe Gabapentin Lisinopril Pravastatin Rosuvastatin Tramadol  Hcl    MEDICATIONS: Ditropan Xl 5 mg tablet, extended release 24 hr 1 tablet PO Daily  Alprazolam  Amlodipine Besilate  Repatha Syringe     GU PSH: None     PSH Notes: neuroma, meniscus    NON-GU PSH: Back Surgery (Unspecified) Neck Surgery     GU PMH: Left renal neoplasm - 08/02/2021 Nocturia - 08/02/2021 Urinary Urgency - 08/02/2021    NON-GU PMH: Hypercholesterolemia Hypertension Sleep Apnea    FAMILY HISTORY: 1 Daughter - Daughter 3 Son's - Son Bladder Cancer - Cousin Breast Cancer - Runs in Family Diabetes - Runs in Family Hypertension - Runs in Family   SOCIAL HISTORY: Marital Status: Married Preferred Language: English; Ethnicity: Not Hispanic Or Latino; Race: White Current Smoking Status: Patient does not smoke anymore.   Tobacco Use Assessment Completed: Used Tobacco in last 30 days? Has never drank.  Drinks 3 caffeinated drinks per day.    REVIEW OF SYSTEMS:    GU Review Female:   Patient reports frequent urination, hard to postpone urination, get up at night to urinate, and leakage of urine. Patient denies burning /pain with urination, stream starts and stops, trouble starting your stream, have to strain to urinate, and being pregnant.  Gastrointestinal (Upper):   Patient reports nausea. Patient denies indigestion/ heartburn and vomiting.  Gastrointestinal (Lower):   Patient denies diarrhea and constipation.  Constitutional:   Patient reports fatigue. Patient denies fever, night sweats, and weight loss.  Skin:   Patient denies skin rash/ lesion and itching.  Eyes:   Patient denies blurred vision and double vision.  Ears/ Nose/ Throat:   Patient denies sore throat and sinus problems.  Hematologic/Lymphatic:  Patient denies swollen glands and easy bruising.  Cardiovascular:   Patient denies leg swelling and chest pains.  Respiratory:   Patient denies cough and shortness of breath.  Endocrine:   Patient denies excessive thirst.  Musculoskeletal:   Patient  reports back pain. Patient denies joint pain.  Neurological:   Patient denies headaches and dizziness.  Psychologic:   Patient denies depression and anxiety.   Notes: Updated from previous visit 08/02/2021 with review from patient as noted above.   VITAL SIGNS:      09/07/2021 10:32 AM  Weight 237 lb / 107.5 kg  Height 64 in / 162.56 cm  BP 155/79 mmHg  Pulse 80 /min  Temperature 97.8 F / 36.5 C  BMI 40.7 kg/m   MULTI-SYSTEM PHYSICAL EXAMINATION:    Constitutional: Obese. No physical deformities. Normally developed. Good grooming.   Neck: Neck symmetrical, not swollen. Normal tracheal position.  Respiratory: Normal breath sounds. No labored breathing, no use of accessory muscles.   Cardiovascular: Regular rate and rhythm. No murmur, no gallop. Normal temperature, normal extremity pulses, no swelling, no varicosities.   Skin: No paleness, no jaundice, no cyanosis. No lesion, no ulcer, no rash.  Neurologic / Psychiatric: Oriented to time, oriented to place, oriented to person. No depression, no anxiety, no agitation.  Gastrointestinal: No mass, no tenderness, no rigidity, non obese abdomen.  Eyes: Normal conjunctivae. Normal eyelids.  Ears, Nose, Mouth, and Throat: Left ear no scars, no lesions, no masses. Right ear no scars, no lesions, no masses. Nose no scars, no lesions, no masses. Normal hearing. Normal lips.  Musculoskeletal: Normal gait and station of head and neck.     Complexity of Data: CLINICAL DATA: Left renal mass incidentally identified by prior MR   EXAM:  CT ABDOMEN WITHOUT AND WITH CONTRAST   TECHNIQUE:  Multidetector CT imaging of the abdomen was performed following the  standard protocol before and following the bolus administration of  intravenous contrast.   RADIATION DOSE REDUCTION: This exam was performed according to the  departmental dose-optimization program which includes automated  exposure control, adjustment of the mA and/or kV according to   patient size and/or use of iterative reconstruction technique.   CONTRAST: 125m ISOVUE-300 IOPAMIDOL (ISOVUE-300) INJECTION 61%   COMPARISON: MR lumbar spine, 06/13/2020   FINDINGS:  Lower chest: No acute abnormality.   Hepatobiliary: No focal liver abnormality is seen. Mild hepatic  steatosis. Status post cholecystectomy. No biliary dilatation.   Pancreas: Unremarkable. No pancreatic ductal dilatation or  surrounding inflammatory changes.   Spleen: Normal in size without significant abnormality.   Adrenals/Urinary Tract: Adrenal glands are unremarkable. There is a  partially exophytic lesion of the anterior inferior pole of the left  kidney measuring 2.3 x 2.1 cm, with slightly above fluid attenuation  on precontrast imaging (HU = 25), with subtle contrast enhancement  on post-contrast imaging (HU = 47). Right kidney is normal, without  renal calculi, solid lesion, or hydronephrosis. Bladder is  unremarkable.   Stomach/Bowel: Stomach is within normal limits. Appendix appears  normal. No evidence of bowel wall thickening, distention, or  inflammatory changes.   Vascular/Lymphatic: Aortic atherosclerosis. No enlarged abdominal  lymph nodes.   Other: No abdominal wall hernia or abnormality. No ascites.   Musculoskeletal: No acute or significant osseous findings.   IMPRESSION:  1. There is a partially exophytic lesion of the anterior inferior  pole of the left kidney measuring 2.3 x 2.1 cm, with slightly above  fluid attenuation on precontrast imaging  and subtle contrast  enhancement. Findings are in keeping with complex, reticular cystic  character of this lesion on prior MRI and concerning for renal cell  carcinoma.  2. No evidence of lymphadenopathy or metastatic disease in the  abdomen.  3. Mild hepatic steatosis.  4. Status post cholecystectomy.   Aortic Atherosclerosis (ICD10-I70.0).    Electronically Signed  By: Delanna Ahmadi M.D.  On: 07/15/2021  09:44 Source Of History:  Patient  Records Review:   Previous Doctor Records, Previous Patient Records  Urine Test Review:   Urinalysis   09/07/21  Urinalysis  Urine Appearance Clear   Urine Color Yellow   Urine Glucose Neg mg/dL  Urine Bilirubin Neg mg/dL  Urine Ketones Neg mg/dL  Urine Specific Gravity 1.025   Urine Blood Neg ery/uL  Urine pH 6.0   Urine Protein Trace mg/dL  Urine Urobilinogen 0.2 mg/dL  Urine Nitrites Neg   Urine Leukocyte Esterase Neg leu/uL   PROCEDURES:          Urinalysis Dipstick Dipstick Cont'd  Color: Yellow Bilirubin: Neg mg/dL  Appearance: Clear Ketones: Neg mg/dL  Specific Gravity: 1.025 Blood: Neg ery/uL  pH: 6.0 Protein: Trace mg/dL  Glucose: Neg mg/dL Urobilinogen: 0.2 mg/dL    Nitrites: Neg    Leukocyte Esterase: Neg leu/uL    ASSESSMENT:      ICD-10 Details  1 GU:   Left renal neoplasm - D49.512 Left, Acute, Uncomplicated   PLAN:   I personally reviewed imaging results and films with the patient. We discussed that the mass in question has features concerning for malignancy. I explained the natural history of presumed renal cell carcinoma. I reviewed the AUA guidelines for evaluation and treatment of the small renal mass. The options of active surveillance, in situ tumor ablation, partial and radical nephrectomy was discussed. The risks of robotic LEFT partial nephrectomy were discussed in detail including but not limited to: negative pathology, open conversion, completion nephrectomy, infection of the urinary tract/skin/abdominal cavity, VTE, MI/CVA, lymphatic leak, injury to adjacent solid/hollow viscus organs, bleeding requiring a blood transfusion, catastrophic bleeding, hernia formation, need for postoperative angioembolization, urinary leak requiring stent/drain, and other imponderables.         Orders Labs CULTURE, URINE          Document Letter(s):  Created for Patient: Clinical Summary         Notes:

## 2021-09-21 NOTE — Discharge Instructions (Signed)

## 2021-09-21 NOTE — Op Note (Signed)
Operative Note  Preoperative diagnosis:  1.  2.6 cm left renal mass   Postoperative diagnosis: 1.  2.6 cm left renal mass   Procedure(s): 1.  Robot-assisted laparoscopic left partial nephrectomy 2.  Intraoperative ultrasound of single retroperitoneal organ  Surgeon: Ellison Hughs, MD  Assistants: 1.  Debbrah Alar, PA-C An assistant was required for this surgical procedure.  The duties of the assistant included but were not limited to suctioning, passing suture, camera manipulation, retraction.  This procedure would not be able to be performed without an Environmental consultant.  2.  Davis Gourd, MD PGY 4  Anesthesia:  General  Complications:  None  EBL: 50 mL  Specimens: 1.  Left renal mass  Drains/Catheters: 1.  Foley catheter  Intraoperative findings:   Intraoperative renal ultrasound revealed a complex cystic mass involving the medial aspects of the lower pole of the left kidney Renorrhaphy was hemostatic at the conclusion of the case  Indication:  Amanda Cordova is a 61 y.o. female with a solid enhancing left renal mass with features concerning for renal cell carcinoma.  She has been consented for the above procedures, voices understanding and wishes to proceed.  Description of procedure:  After informed consent was obtained, the patient was brought to the operating room and general endotracheal anesthesia was administered.  A 16 French Foley catheter was then sterilely placed and set to gravity drainage.  The patient was then placed in the right lateral decubitus position and prepped and draped in usual sterile fashion.  A timeout was performed.  An 8 mm incision was then made lateral to the left rectus muscle at the level of the left 12th rib.  Abdominal access was obtained via a Veress needle.  The abdominal cavity was then insufflated up to 15 mmHg.  An 8 mm port was then introduced into the abdominal cavity.  Inspection of the port entry site by the robotic camera  revealed no adjacent organ injury.  We then placed 3 additional 8 mm robotic ports to triangulate the left renal hilum.  A 12 mm assistant port was then placed between the carmera port and 3rd robotic arm.  The white line of Toldt along the descending colon was incised sharply and the colon, along with its mesocolonic fat, was reflected medially until the aorta was identified.  We then made a small window adjacent to the lower pole of the left kidney, identifying the left psoas muscle, left ureter and left gonadal vein.  The left ureter and gonadal vein were then reflected anteriorly allowing [place] to then incised the perihilar attachments using electrocautery.  We encountered a small lumbar vein adjacent to the insertion of the left gonadal vein into the left renal vein.  This lumbar vein was ligated with hemo-lock clips in 2 places and incised sharply.  This provided [place] excellent exposure to the left renal hilum.  The perilymphatic tissue surrounding the left renal artery was carefully dissected away, creating a window to place a bulldog clamp later in the procedure.  The anterior portion of Gerota's fascia was incised, allowing reflection the perinephric fat medially and laterally until there was adequate exposure of the lower pole left renal mass.  Intraoperative ultrasound confirmed the heterogenous echogenicity of the lesion compared to the remainder of the renal parenchyma and allowed identification of the depth/borders of the mass, which were demarcated using electrocautery along the renal capsule.  We then exposed the left renal artery and placed a bulldog clamp, marking warm ischemia time.  The left kidney immediately became ischemic and pale in appearance.  The left renal mass was then sharply excised with minimal blood loss.  After the mass was free, it was placed in the left upper quadrant to be retrieved later on during the operation.    The renorrhaphy was then performed using a 3-0 V-lock in the  deep layer of the renal parenchyma.  The bulldog clamp was then removed marking warm ischemia time at 12 minutes.   A series of 1-0 Vicryl sutures with Hem-o-lok clips acting as a buttress were then used to reapproximate the renal capsule.  There did not appear to be any obvious bleeding around the renal hilum nor surrounding our repair.  The incised Gerota's fascia overlying the mass was then reapproximated using a running 2-0 V lock suture.  The mass was then placed in an Endo Catch bag.  Surgicel and Vistaseal were then applied to the resection bed.  The robot was then de-docked and the camera was then reinserted into the assistant port. Laparoscopic graspers were then used to grab the string of the Endo Catch bag, which was brought out through the 12 mm assistant port.  The abdomen was then desufflated and all ports were removed.  The assistant port incision was then extended approximately 1-2 cm and the left renal mass, within the Endo Catch bag, was removed and sent to pathology for permanent section.  The fascia within the assistant port incision was then reapproximated using a 0 Vicryl suture.  The remainder of the incisions were then closed using 4-0 Monocryl and dressed appropriately.  Patient tolerated the procedure well and was transferred to the postanesthesia unit in stable condition.  Plan:  Monitor on the floor overnight.

## 2021-09-21 NOTE — Addendum Note (Signed)
Addendum  created 09/21/21 1226 by Duane Boston, MD   Clinical Note Signed, Order list changed, Pharmacy for encounter modified

## 2021-09-21 NOTE — Anesthesia Procedure Notes (Signed)
Procedure Name: Intubation Date/Time: 09/21/2021 7:38 AM Performed by: Gerald Leitz, CRNA Pre-anesthesia Checklist: Patient identified, Emergency Drugs available, Suction available and Patient being monitored Patient Re-evaluated:Patient Re-evaluated prior to induction Oxygen Delivery Method: Circle system utilized Preoxygenation: Pre-oxygenation with 100% oxygen Induction Type: IV induction Ventilation: Mask ventilation without difficulty Laryngoscope Size: Miller and 2 Grade View: Grade I Tube type: Oral Tube size: 7.5 mm Number of attempts: 1 Airway Equipment and Method: Stylet and Oral airway Placement Confirmation: ETT inserted through vocal cords under direct vision, positive ETCO2 and breath sounds checked- equal and bilateral Tube secured with: Tape Dental Injury: Teeth and Oropharynx as per pre-operative assessment

## 2021-09-22 ENCOUNTER — Encounter (HOSPITAL_COMMUNITY): Payer: Self-pay | Admitting: Urology

## 2021-09-22 DIAGNOSIS — C642 Malignant neoplasm of left kidney, except renal pelvis: Secondary | ICD-10-CM | POA: Diagnosis not present

## 2021-09-22 DIAGNOSIS — Z79899 Other long term (current) drug therapy: Secondary | ICD-10-CM | POA: Diagnosis not present

## 2021-09-22 DIAGNOSIS — I2510 Atherosclerotic heart disease of native coronary artery without angina pectoris: Secondary | ICD-10-CM | POA: Diagnosis not present

## 2021-09-22 DIAGNOSIS — I10 Essential (primary) hypertension: Secondary | ICD-10-CM | POA: Diagnosis not present

## 2021-09-22 LAB — BASIC METABOLIC PANEL
Anion gap: 6 (ref 5–15)
BUN: 10 mg/dL (ref 8–23)
CO2: 25 mmol/L (ref 22–32)
Calcium: 8.7 mg/dL — ABNORMAL LOW (ref 8.9–10.3)
Chloride: 109 mmol/L (ref 98–111)
Creatinine, Ser: 0.69 mg/dL (ref 0.44–1.00)
GFR, Estimated: >60 mL/min (ref >60)
Glucose, Bld: 134 mg/dL — ABNORMAL HIGH (ref 70–99)
Potassium: 3.9 mmol/L (ref 3.5–5.1)
Sodium: 140 mmol/L (ref 135–145)

## 2021-09-22 LAB — HEMOGLOBIN AND HEMATOCRIT, BLOOD
HCT: 42.2 % (ref 36.0–46.0)
Hemoglobin: 13.9 g/dL (ref 12.0–15.0)

## 2021-09-22 MED ORDER — CHLORHEXIDINE GLUCONATE CLOTH 2 % EX PADS: 6.0000 | MEDICATED_PAD | Freq: Every day | CUTANEOUS | Status: DC

## 2021-09-22 NOTE — Discharge Summary (Signed)
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   Inflamed seborrheic keratosis 02/02/2021   Peripheral neuropathy 08/20/2020   Genetic testing 07/21/2020   History of optic neuritis 07/13/2020   Hyperlipidemia 07/13/2020   Insomnia 07/13/2020   Obstructive sleep apnea syndrome 07/13/2020   Osteopenia 07/13/2020   Plantar nerve lesion 07/13/2020   Prediabetes 07/13/2020   Vitamin D deficiency 07/13/2020   Family history of breast cancer    Family history of bladder cancer    Family history of thyroid cancer    Cervical myelopathy ([place]) 05/28/2020   Cerebral vascular disease 04/01/2020   History of acoustic neuroma 04/01/2020   Cervical stenosis (uterine cervix) 04/01/2020   Paresthesia 03/05/2020   Gait abnormality 03/05/2020   Raynaud's phenomenon without gangrene 12/25/2019   Emphysema of lung ([place]) 07/09/2019   CAD (coronary artery disease) 06/19/2019   History of craniotomy 06/19/2019   Morbid obesity ([place]) 06/19/2019   Unspecified optic neuritis 01/25/2018   Hypertension 01/10/2018   Optic neuritis, left 01/09/2018   Cervical post-laminectomy syndrome 05/09/2016   Cervical myofascial pain syndrome 05/09/2016   DDD (degenerative disc disease), cervical 05/09/2016   Cervical spondylosis without myelopathy 05/09/2016    Procedures performed: Procedure(s): XI ROBOTIC ASSITED PARTIAL NEPHRECTOMY  History and Physical: For full details, please see admission history and physical. Briefly, Amanda Cordova is a 61 y.o. year old patient with a 2.3cm complex cystic mass of the right kidney. She presented for robotic assisted right partial nephrectomy.    Hospital Course: Patient tolerated the procedure well.  She was  then transferred to the floor after an uneventful PACU stay.  Her hospital course was uncomplicated.  She had no issues overnight and tolerated a clear liquid diet w without nausea or vomiting. POD1 am labs were stable. She was taken off of bedrest and ambulated without difficulty. On POD1 her catheter was removed and she passed a trial of void. On POD#1 she had met discharge criteria: was eating a regular diet, was up and ambulating independently,  pain was well controlled, was voiding without a catheter, and was ready to for discharge.  She has follow up planned for 6/26.    Laboratory values:  Recent Labs    09/21/21 1059 09/22/21 0338  HGB 14.8 13.9  HCT 46.6* 42.2   Recent Labs    09/22/21 0338  NA 140  K 3.9  CL 109  CO2 25  GLUCOSE 134*  BUN 10  CREATININE 0.69  CALCIUM 8.7*   No results for input(s): LABPT, INR in the last 72 hours. No results for input(s): LABURIN in the last 72 hours. Results for orders placed or performed during the hospital encounter of 05/25/20  SARS CORONAVIRUS 2 (TAT 6-24 HRS) Nasopharyngeal Nasopharyngeal Swab     Status: None   Collection Time: 05/25/20  9:18 AM   Specimen: Nasopharyngeal Swab  Result Value Ref Range Status   SARS Coronavirus 2 NEGATIVE NEGATIVE Final    Comment: (NOTE) SARS-CoV-2 target nucleic acids are NOT DETECTED.  The SARS-CoV-2 RNA is generally detectable in upper and lower respiratory specimens during the acute phase of infection. Negative results do not preclude SARS-CoV-2 infection, do not rule out co-infections with other pathogens, and should not be used  as the sole basis for treatment or other patient management decisions. Negative results must be combined with clinical observations, patient history, and epidemiological information. The expected result is Negative.  Fact Sheet for Patients: SugarRoll.be  Fact Sheet for Healthcare  Providers: https://www.woods-mathews.com/  This test is not yet approved or cleared by the [place] FDA and  has been authorized for detection and/or diagnosis of SARS-CoV-2 by FDA under an Emergency Use Authorization (EUA). This EUA will remain  in effect (meaning this test can be used) for the duration of the COVID-19 declaration under Se ction 564(b)(1) of the Act, 21 U.S.C. section 360bbb-3(b)(1), unless the authorization is terminated or revoked sooner.  Performed at [facility], [place] [address], [place], [place] [address]     Disposition: Home  Discharge instruction: The patient was instructed to be ambulatory but told to refrain from heavy lifting, strenuous activity, or driving.   Discharge medications:  Allergies as of 09/22/2021       Reactions   Gabapentin Anaphylaxis, Swelling   eyes swelled, throat, hands,   Tramadol Anaphylaxis, Swelling   Throat and hands   Crestor [rosuvastatin] Other (See Comments)   Muscle aches    Duloxetine Nausea And Vomiting   Lipitor [atorvastatin]    myalgias   Pravastatin    myalgias   Zetia [ezetimibe]    myalgias   Lisinopril Nausea Only        Medication List     STOP taking these medications    ondansetron 8 MG tablet Commonly known as: ZOFRAN       TAKE these medications    albuterol 108 (90 Base) MCG/ACT inhaler Commonly known as: VENTOLIN HFA Inhale 2 puffs into the lungs every 6 (six) hours as needed for wheezing or shortness of breath.   ALPRAZolam 0.25 MG tablet Commonly known as: XANAX TAKE 1 TABLET(0.25 MG) BY MOUTH DAILY AS NEEDED FOR ANXIETY   amLODipine 5 MG tablet Commonly known as: NORVASC TAKE 1 TABLET(5 MG) BY MOUTH DAILY   CareTouch 2 CPAP Hose [employer] Misc   docusate sodium 100 MG capsule Commonly known as: COLACE Take 1 capsule (100 mg total) by mouth 2 (two) times daily.   fluticasone-salmeterol 250-50 MCG/ACT Aepb Commonly known as: Wixela Inhub INHALE 1 PUFF  INTO THE LUNGS TWICE DAILY   HYDROcodone-acetaminophen 5-325 MG tablet Commonly known as: Norco Take 1-2 tablets by mouth every 6 (six) hours as needed for moderate pain or severe pain.   promethazine 12.5 MG tablet Commonly known as: PHENERGAN Take 1 tablet (12.5 mg total) by mouth every 4 (four) hours as needed for nausea or vomiting.   Repatha SureClick 941 MG/ML Soaj Generic drug: Evolocumab Inject 140 mg into the skin every 14 (fourteen) days.        Followup:   Follow-up Information     Ceasar Mons, MD Follow up on 10/11/2021.   Specialty: Urology Why: at 2:00 Contact information: [address] [place] [address] [phone]

## 2021-09-22 NOTE — Plan of Care (Signed)
  Problem: Bowel/Gastric: Goal: Gastrointestinal status for postoperative course will improve Outcome: Progressing   Problem: Clinical Measurements: Goal: Postoperative complications will be avoided or minimized Outcome: Progressing   Problem: Urinary Elimination: Goal: Ability to achieve and maintain urine output will improve Outcome: Progressing   Problem: Education: Goal: Knowledge of General Education information will improve Description: Including pain rating scale, medication(s)/side effects and non-pharmacologic comfort measures Outcome: Progressing

## 2021-09-22 NOTE — Progress Notes (Signed)
AVS and discharge instructions reviewed w/ patient and husband at the bedside. Patient and husband verbalized understanding and had no further questions.

## 2021-09-22 NOTE — Plan of Care (Signed)
  Problem: Education: Goal: Knowledge of the prescribed therapeutic regimen will improve Outcome: Progressing   Problem: Bowel/Gastric: Goal: Gastrointestinal status for postoperative course will improve Outcome: Progressing   Problem: Clinical Measurements: Goal: Postoperative complications will be avoided or minimized Outcome: Progressing   Problem: Urinary Elimination: Goal: Ability to avoid or minimize complications of infection will improve Outcome: Progressing   Problem: Clinical Measurements: Goal: Ability to maintain clinical measurements within normal limits will improve Outcome: Progressing

## 2021-09-22 NOTE — TOC Initial Note (Signed)
Transition of Care [facility]) - Initial/Assessment Note    Patient Details  Name: Amanda Cordova MRN: [id number] Date of Birth: 10-20-1960  Transition of Care [facility]) CM/SW Contact:    Leeroy Cha, RN Phone Number: 09/22/2021, 7:36 AM  Clinical Narrative:                  Transition of Care [facility]) Screening Note   Patient Details  Name: Amanda Cordova Date of Birth: 1960/06/05   Transition of Care [facility]) CM/SW Contact:    Leeroy Cha, RN Phone Number: 09/22/2021, 7:36 AM    Transition of Care Department [facility]) has reviewed patient and no TOC needs have been identified at this time. We will continue to monitor patient advancement through interdisciplinary progression rounds. If new patient transition needs arise, please place a TOC consult.    Expected Discharge Plan: Home/Self Care Barriers to Discharge: Continued Medical Work up   Patient Goals and CMS Choice Patient states their goals for this hospitalization and ongoing recovery are:: to go home CMS Medicare.gov Compare Post Acute Care list provided to:: Patient    Expected Discharge Plan and Services Expected Discharge Plan: Home/Self Care   Discharge Planning Services: CM Consult   Living arrangements for the past 2 months: Single Family Home                                      Prior Living Arrangements/Services Living arrangements for the past 2 months: Single Family Home Lives with:: Spouse Patient language and need for interpreter reviewed:: Yes Do you feel safe going back to the place where you live?: Yes      Need for Family Participation in Patient Care: Yes (Comment) (husband)     Criminal Activity/Legal Involvement Pertinent to Current Situation/Hospitalization: No - Comment as needed  Activities of Daily Living Home Assistive Devices/Equipment: Cane (specify quad or straight), CPAP, Eyeglasses ADL Screening (condition at time of admission) Patient's cognitive  ability adequate to safely complete daily activities?: Yes Is the patient deaf or have difficulty hearing?: Yes Does the patient have difficulty seeing, even when wearing glasses/contacts?: No Does the patient have difficulty concentrating, remembering, or making decisions?: No Patient able to express need for assistance with ADLs?: Yes Does the patient have difficulty dressing or bathing?: No Independently performs ADLs?: Yes (appropriate for developmental age) Does the patient have difficulty walking or climbing stairs?: Yes Weakness of Legs: None Weakness of Arms/Hands: None  Permission Sought/Granted                  Emotional Assessment Appearance:: Appears stated age Attitude/Demeanor/Rapport: Engaged Affect (typically observed): Calm Orientation: : Oriented to Self, Oriented to Place, Oriented to  Time, Oriented to Situation Alcohol / Substance Use: Not Applicable Psych Involvement: No (comment)  Admission diagnosis:  Renal mass [N28.89] Patient Active Problem List   Diagnosis Date Noted   Renal mass 09/21/2021   Left renal mass 07/21/2021   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   Inflamed seborrheic keratosis 02/02/2021   Peripheral neuropathy 08/20/2020   Genetic testing 07/21/2020   History of optic neuritis 07/13/2020   Hyperlipidemia 07/13/2020   Insomnia 07/13/2020   Obstructive sleep apnea syndrome 07/13/2020   Osteopenia 07/13/2020   Plantar nerve lesion 07/13/2020   Prediabetes 07/13/2020   Vitamin D deficiency 07/13/2020   Family history of breast cancer    Family history of bladder cancer  Family history of thyroid cancer    Cervical myelopathy ([place]) 05/28/2020   Cerebral vascular disease 04/01/2020   History of acoustic neuroma 04/01/2020   Cervical stenosis (uterine cervix) 04/01/2020   Paresthesia 03/05/2020   Gait abnormality 03/05/2020   Raynaud's phenomenon without gangrene 12/25/2019   Emphysema of lung ([place]) 07/09/2019   CAD (coronary artery  disease) 06/19/2019   History of craniotomy 06/19/2019   Morbid obesity ([place]) 06/19/2019   Unspecified optic neuritis 01/25/2018   Hypertension 01/10/2018   Optic neuritis, left 01/09/2018   Cervical post-laminectomy syndrome 05/09/2016   Cervical myofascial pain syndrome 05/09/2016   DDD (degenerative disc disease), cervical 05/09/2016   Cervical spondylosis without myelopathy 05/09/2016   PCP:  [name], [name], MD Pharmacy:   [place], [place] - [address] AT [place] [address] Phone: [phone] Fax: [phone]  [facility] [place], [place] [address] Phone: [phone] Fax: [phone]  [facility] [address] [place] [address] Phone: [phone] Fax: [phone]     Social Determinants of Health (SDOH) Interventions    Readmission Risk Interventions     View : No data to display.

## 2021-09-23 LAB — SURGICAL PATHOLOGY

## 2021-10-06 NOTE — Progress Notes (Signed)
HPI:FU CAD. Previously had chest CT for lung cancer screening. Noted to have aortic atherosclerosis and coronary artery calcification. COPD also noted.  Echocardiogram November 2019 showed normal LV function, moderate left ventricular hypertrophy, mild tricuspid regurgitation.  Nuclear study September 2022 showed ejection fraction 58% with no ischemia or infarction.  Abdominal CT March 2023 concerning for renal cell carcinoma on the left.  Since last seen, she has some dyspnea on exertion unchanged.  No orthopnea, PND, pedal edema, exertional chest pain or syncope.  Current Outpatient Medications  Medication Sig Dispense Refill   albuterol (VENTOLIN HFA) 108 (90 Base) MCG/ACT inhaler Inhale 2 puffs into the lungs every 6 (six) hours as needed for wheezing or shortness of breath. 54 g 1   ALPRAZolam (XANAX) 0.25 MG tablet TAKE 1 TABLET(0.25 MG) BY MOUTH DAILY AS NEEDED FOR ANXIETY 30 tablet 3   amLODipine (NORVASC) 5 MG tablet TAKE 1 TABLET(5 MG) BY MOUTH DAILY 90 tablet 2   docusate sodium (COLACE) 100 MG capsule Take 1 capsule (100 mg total) by mouth 2 (two) times daily.     Evolocumab (REPATHA SURECLICK) 465 MG/ML SOAJ Inject 140 mg into the skin every 14 (fourteen) days. 2 mL 11   fluticasone-salmeterol (WIXELA INHUB) 250-50 MCG/ACT AEPB INHALE 1 PUFF INTO THE LUNGS TWICE DAILY 180 each 2   promethazine (PHENERGAN) 12.5 MG tablet Take 1 tablet (12.5 mg total) by mouth every 4 (four) hours as needed for nausea or vomiting. 15 tablet 0   Respiratory Therapy Supplies (CARETOUCH 2 CPAP HOSE HANGER) MISC      No current facility-administered medications for this visit.     Past Medical History:  Diagnosis Date   Acoustic neuroma ([place])    CAD (coronary artery disease)    Chronic kidney disease    COPD (chronic obstructive pulmonary disease) (HCC)    Dyspnea    Family history of bladder cancer    Family history of breast cancer    Family history of thyroid cancer    Hyperlipemia     Hypertension    Neuropathy    Optic neuritis    Pre-diabetes    Sleep apnea    wears cpap    Past Surgical History:  Procedure Laterality Date   ABDOMINAL HYSTERECTOMY     ANTERIOR CERVICAL DECOMP/DISCECTOMY FUSION N/A 05/28/2020   Procedure: ANTERIOR CERVICAL DECOMPRESSION FUSION CERVICAL THREE-FOUR.;  Surgeon: Karsten Ro, DO;  Location: [facility];  Service: Neurosurgery;  Laterality: N/A;  anterior   BREAST EXCISIONAL BIOPSY Right 2000   benign   cerical fusion  1995   CHOLECYSTECTOMY     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   HYSTERECTOMY ABDOMINAL WITH SALPINGECTOMY     MENISCUS REPAIR Left    ROBOTIC ASSITED PARTIAL NEPHRECTOMY Left 09/21/2021   Procedure: XI ROBOTIC ASSITED PARTIAL NEPHRECTOMY;  Surgeon: Ceasar Mons, MD;  Location: [facility];  Service: Urology;  Laterality: Left;    Social History   Socioeconomic History   Marital status: Married    Spouse name: Not on file   Number of children: 4   Years of education: 12   Highest education level: 12th grade  Occupational History   Occupation: Disabled  Tobacco Use   Smoking status: Former    Packs/day: 1.00    Years: 35.00    Total pack years: 35.00    Types: Cigarettes    Quit date: 01/09/2018    Years since quitting: 3.7    Passive  exposure: Current   Smokeless tobacco: Never  Vaping Use   Vaping Use: Never used  Substance and Sexual Activity   Alcohol use: Not Currently   Drug use: Never   Sexual activity: Yes  Other Topics Concern   Not on file  Social History Narrative   Lives at home with her husband and her great-grandson.   Right-handed.   2-3 cups caffeine per day.    Social Determinants of Health   Financial Resource Strain: Low Risk  (06/07/2021)   Overall Financial Resource Strain (CARDIA)    Difficulty of Paying Living Expenses: Not hard at all  Food Insecurity: Food Insecurity Present (06/07/2021)   Hunger Vital Sign    Worried About Running Out of Food in the Last  Year: Sometimes true    Ran Out of Food in the Last Year: Sometimes true  Transportation Needs: No Transportation Needs (06/07/2021)   PRAPARE - Hydrologist (Medical): No    Lack of Transportation (Non-Medical): No  Physical Activity: Sufficiently Active (06/07/2021)   Exercise Vital Sign    Days of Exercise per Week: 7 days    Minutes of Exercise per Session: 60 min  Stress: No Stress Concern Present (06/07/2021)   [place]    Feeling of Stress : Not at all  Social Connections: Unknown (04/20/2021)   Social Connection and Isolation Panel [NHANES]    Frequency of Communication with Friends and Family: More than three times a week    Frequency of Social Gatherings with Friends and Family: Twice a week    Attends Religious Services: Patient refused    Marine scientist or Organizations: No    Attends Music therapist: Not on file    Marital Status: Married  Human resources officer Violence: Not At Risk (07/01/2020)   Humiliation, Afraid, Rape, and Kick questionnaire    Fear of Current or Ex-Partner: No    Emotionally Abused: No    Physically Abused: No    Sexually Abused: No    Family History  Problem Relation Age of Onset   COPD Mother    Heart failure Mother    Heart attack Father    Breast cancer Sister    Diabetes Sister 53       negative genetic test (VUS in CHEK2 c.[id number] possibly mosaic)   Breast cancer Sister 31   Breast cancer Sister        dx 94s   Diabetes Maternal Aunt    Kidney cancer Maternal Uncle        dx >50   Bladder Cancer Other        dx 53s (mother's first cousin)   Thyroid cancer Other        dx 103s (mother's first cousin)   Breast cancer Other     ROS: Some residual pain from recent resection of renal cell carcinoma but no fevers or chills, productive cough, hemoptysis, dysphasia, odynophagia, melena, hematochezia, dysuria, hematuria, rash,  seizure activity, orthopnea, PND, pedal edema, claudication. Remaining systems are negative.  Physical Exam: Well-developed well-nourished in no acute distress.  Skin is warm and dry.  HEENT is normal.  Neck is supple.  Chest is clear to auscultation with normal expansion.  Cardiovascular exam is regular rate and rhythm.  Abdominal exam nontender or distended. No masses palpated. Extremities show no edema. neuro grossly intact  A/P  1 coronary artery disease/coronary calcification-most recent nuclear study showed no  ischemia and patient is not having chest pain.  Continue medical therapy with Repatha.  She is intolerant to statins.  Resume aspirin 81 mg daily.  2 history of dyspnea-this is felt secondary to COPD.  3 hypertension-blood pressure elevated.  Increase amlodipine to 10 mg daily and follow.  4 hyperlipidemia-statin intolerant; continue repatha.  Kirk Ruths, MD

## 2021-10-11 DIAGNOSIS — Z85528 Personal history of other malignant neoplasm of kidney: Secondary | ICD-10-CM | POA: Diagnosis not present

## 2021-10-11 DIAGNOSIS — C642 Malignant neoplasm of left kidney, except renal pelvis: Secondary | ICD-10-CM | POA: Diagnosis not present

## 2021-10-12 DIAGNOSIS — G720 Drug-induced myopathy: Secondary | ICD-10-CM | POA: Insufficient documentation

## 2021-10-14 ENCOUNTER — Encounter: Payer: Self-pay | Admitting: Cardiology

## 2021-10-14 ENCOUNTER — Ambulatory Visit: Payer: Medicare HMO | Admitting: Cardiology

## 2021-10-14 VITALS — BP 148/80 | HR 60 | Ht 64.0 in | Wt 239.8 lb

## 2021-10-14 DIAGNOSIS — E785 Hyperlipidemia, unspecified: Secondary | ICD-10-CM | POA: Diagnosis not present

## 2021-10-14 DIAGNOSIS — I2510 Atherosclerotic heart disease of native coronary artery without angina pectoris: Secondary | ICD-10-CM

## 2021-10-14 DIAGNOSIS — I10 Essential (primary) hypertension: Secondary | ICD-10-CM | POA: Diagnosis not present

## 2021-10-14 MED ORDER — AMLODIPINE BESYLATE 10 MG PO TABS: 10.0000 mg | ORAL_TABLET | Freq: Every day | ORAL | 3 refills | Status: DC

## 2021-10-14 MED ORDER — ASPIRIN 81 MG PO TBEC: 81.0000 mg | DELAYED_RELEASE_TABLET | Freq: Every day | ORAL | 3 refills | Status: AC

## 2021-10-14 NOTE — Patient Instructions (Signed)
Medication Instructions:   START ASPIRIN 81 MG ONCE DAILY  INCREASE AMLODIPINE TO 10 MG ONCE DAILY= 2 OF THE 5 MG TABLETS ONCE DAILY  *If you need a refill on your cardiac medications before your next appointment, please call your pharmacy*   Follow-Up: At [facility], you and your health needs are our priority.  As part of our continuing mission to provide you with exceptional heart care, we have created designated Provider Care Teams.  These Care Teams include your primary Cardiologist (physician) and Advanced Practice Providers (APPs -  Physician Assistants and Nurse Practitioners) who all work together to provide you with the care you need, when you need it.  We recommend signing up for the patient portal called "MyChart".  Sign up information is provided on this After Visit Summary.  MyChart is used to connect with patients for Virtual Visits (Telemedicine).  Patients are able to view lab/test results, encounter notes, upcoming appointments, etc.  Non-urgent messages can be sent to your provider as well.   To learn more about what you can do with MyChart, go to [email or web address].    Your next appointment:   12 month(s)  The format for your next appointment:   In Person  Provider:   Kirk Ruths, MD       Important Information About Sugar

## 2021-11-10 DIAGNOSIS — D49512 Neoplasm of unspecified behavior of left kidney: Secondary | ICD-10-CM | POA: Diagnosis not present

## 2021-11-16 ENCOUNTER — Ambulatory Visit: Payer: Medicare HMO | Admitting: Podiatry

## 2021-12-07 DIAGNOSIS — R490 Dysphonia: Secondary | ICD-10-CM | POA: Diagnosis not present

## 2021-12-07 DIAGNOSIS — H6122 Impacted cerumen, left ear: Secondary | ICD-10-CM | POA: Diagnosis not present

## 2021-12-08 NOTE — Progress Notes (Unsigned)
HPI: [name] is a 61 y.o. female, who is here today for follow up. She was last seen on 06/11/21.  Since her last visit, she has undergone robotic assisted partial left nephrectomy, 09/21/21. Planning on having abdominal CT in 01/2022.  She has also seen cardiologist and ENT. Dysphonia: States that ENT referred her to "voice specialist."  Hypertension:  Medications:Amlodipine 10 mg daily. BP readings at home:120's/70's. Negative for unusual or severe headache, visual changes, exertional chest pain, dyspnea,  focal weakness, or edema.  She has been consistent with following a healthful diet, plenty of vegetables and fruits. She has lost some wt.  Lab Results  Component Value Date   CREATININE 0.69 09/22/2021   BUN 10 09/22/2021   NA 140 09/22/2021   K 3.9 09/22/2021   CL 109 09/22/2021   CO2 25 09/22/2021   Aortic atherosclerosis seen on abdominal CT done on 07/15/21. She is on Aspirin 81 mg daily and Repatha 140 mg q 2 weeks. She has not tolerated different statins nor Zetia.  Lab Results  Component Value Date   CHOL 101 07/29/2021   HDL 51 07/29/2021   LDLCALC 37 07/29/2021   TRIG 58 07/29/2021   CHOLHDL 2.0 07/29/2021   She is on Alprazolam 0.25 mg daily prn for "balance" , hx if acoustic neuroma. She uses a cane.    12/10/2021    7:27 AM 06/07/2021   11:21 AM 04/21/2021    7:21 AM 07/01/2020    9:55 AM 12/25/2019    9:59 AM  Depression screen PHQ 2/9  Decreased Interest 0 0 0 0 0  Down, Depressed, Hopeless 0 0 0 0 0  PHQ - 2 Score 0 0 0 0 0  Altered sleeping 2  1    Tired, decreased energy 0  1    Change in appetite 0  0    Feeling bad or failure about yourself  0  0    Trouble concentrating 0  0    Moving slowly or fidgety/restless 0  0    Suicidal thoughts 0  0    PHQ-9 Score 2  2    Difficult doing work/chores Not difficult at all  Not difficult at all     Insomnia: She has difficulty falling sleep and stating asleep, most days and has  been going on for several months. Goes to bed around 10 pm, wakes up at 2 am and 5 am.  COPD: Fluticasone-solumedrol 250-50 mcg is not helping. + Cough,wheezing,and SOB exacerbated by exertion. Albuterol inh helps. No associated CP or diaphoresis.  Review of Systems  Constitutional:  Positive for fatigue. Negative for activity change, appetite change and fever.  HENT:  Negative for mouth sores, nosebleeds and sore throat.   Gastrointestinal:  Negative for abdominal pain, nausea and vomiting.       Negative for changes in bowel habits.  Genitourinary:  Negative for decreased urine volume, dysuria and hematuria.  Musculoskeletal:  Positive for arthralgias and gait problem.  Skin:  Negative for rash.  Neurological:  Negative for syncope and facial asymmetry.  Rest see pertinent positives and negatives per HPI.  Current Outpatient Medications on File Prior to Visit  Medication Sig Dispense Refill   ALPRAZolam (XANAX) 0.25 MG tablet TAKE 1 TABLET(0.25 MG) BY MOUTH DAILY AS NEEDED FOR ANXIETY 30 tablet 3   amLODipine (NORVASC) 10 MG tablet Take 1 tablet (10 mg total) by mouth daily. 90 tablet 3   aspirin EC 81 MG tablet Take  1 tablet (81 mg total) by mouth daily. Swallow whole. 90 tablet 3   docusate sodium (COLACE) 100 MG capsule Take 1 capsule (100 mg total) by mouth 2 (two) times daily.     Evolocumab (REPATHA SURECLICK) 671 MG/ML SOAJ Inject 140 mg into the skin every 14 (fourteen) days. 2 mL 11   promethazine (PHENERGAN) 12.5 MG tablet Take 1 tablet (12.5 mg total) by mouth every 4 (four) hours as needed for nausea or vomiting. 15 tablet 0   Respiratory Therapy Supplies (CARETOUCH 2 CPAP HOSE HANGER) MISC      No current facility-administered medications on file prior to visit.   Past Medical History:  Diagnosis Date   Acoustic neuroma ([place])    CAD (coronary artery disease)    Chronic kidney disease    COPD (chronic obstructive pulmonary disease) (HCC)    Dyspnea    Family  history of bladder cancer    Family history of breast cancer    Family history of thyroid cancer    Hyperlipemia    Hypertension    Neuropathy    Optic neuritis    Pre-diabetes    Sleep apnea    wears cpap   Allergies  Allergen Reactions   Gabapentin Anaphylaxis and Swelling    eyes swelled, throat, hands,   Tramadol Anaphylaxis and Swelling    Throat and hands   Crestor [Rosuvastatin] Other (See Comments)    Muscle aches    Duloxetine Nausea And Vomiting   Lipitor [Atorvastatin]     myalgias   Pravastatin     myalgias   Zetia [Ezetimibe]     myalgias   Lisinopril Nausea Only    Social History   Socioeconomic History   Marital status: Married    Spouse name: Not on file   Number of children: 4   Years of education: 12   Highest education level: 12th grade  Occupational History   Occupation: Disabled  Tobacco Use   Smoking status: Former    Packs/day: 1.00    Years: 35.00    Total pack years: 35.00    Types: Cigarettes    Quit date: 01/09/2018    Years since quitting: 3.9    Passive exposure: Current   Smokeless tobacco: Never  Vaping Use   Vaping Use: Never used  Substance and Sexual Activity   Alcohol use: Not Currently   Drug use: Never   Sexual activity: Yes  Other Topics Concern   Not on file  Social History Narrative   Lives at home with her husband and her great-grandson.   Right-handed.   2-3 cups caffeine per day.    Social Determinants of Health   Financial Resource Strain: Low Risk  (06/07/2021)   Overall Financial Resource Strain (CARDIA)    Difficulty of Paying Living Expenses: Not hard at all  Food Insecurity: Food Insecurity Present (06/07/2021)   Hunger Vital Sign    Worried About Running Out of Food in the Last Year: Sometimes true    Ran Out of Food in the Last Year: Sometimes true  Transportation Needs: No Transportation Needs (06/07/2021)   PRAPARE - Hydrologist (Medical): No    Lack of  Transportation (Non-Medical): No  Physical Activity: Sufficiently Active (06/07/2021)   Exercise Vital Sign    Days of Exercise per Week: 7 days    Minutes of Exercise per Session: 60 min  Stress: No Stress Concern Present (06/07/2021)   [place] -  Occupational Stress Questionnaire    Feeling of Stress : Not at all  Social Connections: Unknown (04/20/2021)   Social Connection and Isolation Panel [NHANES]    Frequency of Communication with Friends and Family: More than three times a week    Frequency of Social Gatherings with Friends and Family: Twice a week    Attends Religious Services: Patient refused    Active Member of Clubs or Organizations: No    Attends Archivist Meetings: Not on file    Marital Status: Married   Vitals:   12/10/21 0709  BP: 128/80  Pulse: 70  Resp: 12  Temp: 98.4 F (36.9 C)  SpO2: 97%   Body mass index is 40.77 kg/m.  Physical Exam Vitals and nursing note reviewed.  Constitutional:      General: She is not in acute distress.    Appearance: She is well-developed.  HENT:     Head: Normocephalic and atraumatic.     Mouth/Throat:     Mouth: Mucous membranes are moist.     Pharynx: Oropharynx is clear.  Eyes:     Conjunctiva/sclera: Conjunctivae normal.  Cardiovascular:     Rate and Rhythm: Normal rate and regular rhythm.     Pulses:          Dorsalis pedis pulses are 2+ on the right side and 2+ on the left side.     Heart sounds: No murmur heard.    Comments: Trace pitting LE edema, bilateral. Pulmonary:     Effort: Pulmonary effort is normal. No respiratory distress.     Breath sounds: Normal breath sounds.  Abdominal:     Palpations: Abdomen is soft. There is no hepatomegaly or mass.     Tenderness: There is no abdominal tenderness.  Lymphadenopathy:     Cervical: No cervical adenopathy.  Skin:    General: Skin is warm.     Findings: No erythema or rash.  Neurological:     General: No focal  deficit present.     Mental Status: She is alert and oriented to person, place, and time.     Cranial Nerves: No cranial nerve deficit.     Comments: Gait assisted with a cane,  Psychiatric:     Comments: Well groomed, good eye contact.   ASSESSMENT AND PLAN:  [name] was seen today for follow-up.  Diagnoses and all orders for this visit:  Insomnia We discussed a couple pharmacologic options, she agrees with trying Trazodone 50 mg 1/2-1 tab at bedtime. Good sleep hygiene also recommended.  Hypertension BP adequately controlled. Continue Amlodipine 10 mg daily and low salt diet. Continue monitoring BP regularly.  Atherosclerosis of aorta (HCC) Continue Repatha and Aspirin 81 mg daily. LDL 37 in 07/2021.  Emphysema of lung (HCC) Fluticasone-solumedrol 250-50 mcg  is not helping,so discontinued. Spiriva 2.5 mcg samples given to take 2 puff daily. Pending low dose chest CT for lung cancer screening.  Return in about 4 months (around 04/11/2022).  Amanda Cordova G. [name], MD  [facility]. [place] office.

## 2021-12-09 DIAGNOSIS — I700 Atherosclerosis of aorta: Secondary | ICD-10-CM | POA: Insufficient documentation

## 2021-12-10 ENCOUNTER — Encounter: Payer: Self-pay | Admitting: Family Medicine

## 2021-12-10 ENCOUNTER — Ambulatory Visit (INDEPENDENT_AMBULATORY_CARE_PROVIDER_SITE_OTHER): Payer: Medicare HMO | Admitting: Family Medicine

## 2021-12-10 VITALS — BP 128/80 | HR 70 | Temp 98.4°F | Resp 12 | Ht 64.0 in | Wt 237.5 lb

## 2021-12-10 DIAGNOSIS — G4700 Insomnia, unspecified: Secondary | ICD-10-CM

## 2021-12-10 DIAGNOSIS — I700 Atherosclerosis of aorta: Secondary | ICD-10-CM | POA: Diagnosis not present

## 2021-12-10 DIAGNOSIS — I10 Essential (primary) hypertension: Secondary | ICD-10-CM

## 2021-12-10 DIAGNOSIS — J432 Centrilobular emphysema: Secondary | ICD-10-CM | POA: Diagnosis not present

## 2021-12-10 DIAGNOSIS — E559 Vitamin D deficiency, unspecified: Secondary | ICD-10-CM

## 2021-12-10 MED ORDER — ALBUTEROL SULFATE HFA 108 (90 BASE) MCG/ACT IN AERS
2.0000 | INHALATION_SPRAY | Freq: Four times a day (QID) | RESPIRATORY_TRACT | 2 refills | Status: DC | PRN
Start: 2021-12-10 — End: 2022-02-25

## 2021-12-10 MED ORDER — SPIRIVA RESPIMAT 2.5 MCG/ACT IN AERS: 2.0000 | INHALATION_SPRAY | Freq: Every day | RESPIRATORY_TRACT | 3 refills | Status: DC

## 2021-12-10 MED ORDER — TRAZODONE HCL 50 MG PO TABS: 25.0000 mg | ORAL_TABLET | Freq: Every evening | ORAL | 3 refills | Status: DC | PRN

## 2021-12-10 NOTE — Assessment & Plan Note (Signed)
Continue Repatha and Aspirin 81 mg daily. LDL 37 in 07/2021.

## 2021-12-10 NOTE — Patient Instructions (Addendum)
A few things to remember from today's visit:   Primary hypertension  Atherosclerosis of aorta (HCC)  Centrilobular emphysema ([place]) - Plan: albuterol (VENTOLIN HFA) 108 (90 Base) MCG/ACT inhaler, Tiotropium Bromide Monohydrate (SPIRIVA RESPIMAT) 2.5 MCG/ACT AERS  If you need refills please call your pharmacy. Do not use My Chart to request refills or for acute issues that need immediate attention.   Today Spiriva 2.5 mcg  2 puff daily started. Stop Advair. Continue Albuterol inh prn. Trazodone 1/2-1 tab at bedtime to help with sleep.  Rest no changes.  Please be sure medication list is accurate. If a new problem present, please set up appointment sooner than planned today.

## 2021-12-10 NOTE — Assessment & Plan Note (Signed)
BP adequately controlled. Continue Amlodipine 10 mg daily and low salt diet. Continue monitoring BP regularly.

## 2021-12-10 NOTE — Assessment & Plan Note (Signed)
Fluticasone-solumedrol 250-50 mcg  is not helping,so discontinued. Spiriva 2.5 mcg samples given to take 2 puff daily.

## 2021-12-10 NOTE — Assessment & Plan Note (Signed)
We discussed a couple pharmacologic options, she agrees with trying Trazodone 50 mg 1/2-1 tab at bedtime. Good sleep hygiene also recommended.

## 2021-12-13 DIAGNOSIS — H35363 Drusen (degenerative) of macula, bilateral: Secondary | ICD-10-CM | POA: Diagnosis not present

## 2021-12-13 DIAGNOSIS — H524 Presbyopia: Secondary | ICD-10-CM | POA: Diagnosis not present

## 2021-12-13 DIAGNOSIS — Z0100 Encounter for examination of eyes and vision without abnormal findings: Secondary | ICD-10-CM | POA: Diagnosis not present

## 2021-12-22 DIAGNOSIS — M4696 Unspecified inflammatory spondylopathy, lumbar region: Secondary | ICD-10-CM | POA: Diagnosis not present

## 2021-12-22 DIAGNOSIS — Z6841 Body Mass Index (BMI) 40.0 and over, adult: Secondary | ICD-10-CM | POA: Diagnosis not present

## 2021-12-22 DIAGNOSIS — M5441 Lumbago with sciatica, right side: Secondary | ICD-10-CM | POA: Diagnosis not present

## 2022-01-03 ENCOUNTER — Other Ambulatory Visit: Payer: Self-pay | Admitting: Family Medicine

## 2022-01-04 DIAGNOSIS — M48062 Spinal stenosis, lumbar region with neurogenic claudication: Secondary | ICD-10-CM | POA: Diagnosis not present

## 2022-01-06 NOTE — Progress Notes (Unsigned)
HPI: Amanda Cordova is a 61 y.o. female, who is here today to follow on medication. She was seen last on 12/10/21.  COPD: Fluticasone-solumedrol 250-50 mcg  iwa not helping,so discontinued. Spiriva 2.5 mcg 2 puff daily has helped. She has not needed Albuterol inh.  Insomnia: She took Trazodone for a few days, discontinued because did not feel well, it caused edema. Medication did not help much with sleep, difficulty falling and staying asleep.  She is on Alprazolam 0.25 mg daily prn for vestibular disorder.  Hx of left vestibular Schwannoma. She had surgical removal of tumor in [place] on 11.15.2011.   HTN:BP has been elevated for the past few days, 138-140's/90's. She is on Amlodipine 10 mg daily. Frustrated because her pharmacy is refusing to fill Rx, which was sent by her cardiologist in 09/2021 with 3 refills.  No changes in diet or new medications. Negative for severe/frequent headache, visual changes, chest pain, dyspnea, palpitation,or focal weakness,.  Lab Results  Component Value Date   CREATININE 0.69 09/22/2021   BUN 10 09/22/2021   NA 140 09/22/2021   K 3.9 09/22/2021   CL 109 09/22/2021   CO2 25 09/22/2021   Review of Systems  Constitutional:  Positive for fatigue. Negative for activity change, appetite change and fever.  HENT:  Negative for mouth sores, nosebleeds and sore throat.   Respiratory:  Negative for cough and wheezing.   Gastrointestinal:  Negative for abdominal pain, nausea and vomiting.       Negative for changes in bowel habits.  Genitourinary:  Negative for decreased urine volume, dysuria and hematuria.  Musculoskeletal:  Positive for gait problem.  Skin:  Negative for rash.  Neurological:  Negative for syncope, facial asymmetry and weakness.  Psychiatric/Behavioral:  Positive for sleep disturbance. Negative for confusion.   Rest see pertinent positives and negatives per HPI.  Current Outpatient Medications on File Prior to Visit   Medication Sig Dispense Refill   albuterol (VENTOLIN HFA) 108 (90 Base) MCG/ACT inhaler Inhale 2 puffs into the lungs every 6 (six) hours as needed for wheezing or shortness of breath. 18 g 2   amLODipine (NORVASC) 10 MG tablet Take 1 tablet (10 mg total) by mouth daily. 90 tablet 3   aspirin EC 81 MG tablet Take 1 tablet (81 mg total) by mouth daily. Swallow whole. 90 tablet 3   docusate sodium (COLACE) 100 MG capsule Take 1 capsule (100 mg total) by mouth 2 (two) times daily.     Evolocumab (REPATHA SURECLICK) 967 MG/ML SOAJ Inject 140 mg into the skin every 14 (fourteen) days. 2 mL 11   promethazine (PHENERGAN) 12.5 MG tablet Take 1 tablet (12.5 mg total) by mouth every 4 (four) hours as needed for nausea or vomiting. 15 tablet 0   Respiratory Therapy Supplies (CARETOUCH 2 CPAP HOSE HANGER) MISC      Tiotropium Bromide Monohydrate (SPIRIVA RESPIMAT) 2.5 MCG/ACT AERS Inhale 2 puffs into the lungs daily. 4 g 3   No current facility-administered medications on file prior to visit.   Past Medical History:  Diagnosis Date   Acoustic neuroma ([place])    CAD (coronary artery disease)    Chronic kidney disease    COPD (chronic obstructive pulmonary disease) (HCC)    Dyspnea    Family history of bladder cancer    Family history of breast cancer    Family history of thyroid cancer    Hyperlipemia    Hypertension    Neuropathy    Optic  neuritis    Pre-diabetes    Sleep apnea    wears cpap   Allergies  Allergen Reactions   Gabapentin Anaphylaxis and Swelling    eyes swelled, throat, hands,   Tramadol Anaphylaxis and Swelling    Throat and hands   Crestor [Rosuvastatin] Other (See Comments)    Muscle aches    Duloxetine Nausea And Vomiting   Lipitor [Atorvastatin]     myalgias   Pravastatin     myalgias   Zetia [Ezetimibe]     myalgias   Lisinopril Nausea Only    Social History   Socioeconomic History   Marital status: Married    Spouse name: Not on file   Number of  children: 4   Years of education: 12   Highest education level: 12th grade  Occupational History   Occupation: Disabled  Tobacco Use   Smoking status: Former    Packs/day: 1.00    Years: 35.00    Total pack years: 35.00    Types: Cigarettes    Quit date: 01/09/2018    Years since quitting: 4.0    Passive exposure: Current   Smokeless tobacco: Never  Vaping Use   Vaping Use: Never used  Substance and Sexual Activity   Alcohol use: Not Currently   Drug use: Never   Sexual activity: Yes  Other Topics Concern   Not on file  Social History Narrative   Lives at home with her husband and her great-grandson.   Right-handed.   2-3 cups caffeine per day.    Social Determinants of Health   Financial Resource Strain: Low Risk  (06/07/2021)   Overall Financial Resource Strain (CARDIA)    Difficulty of Paying Living Expenses: Not hard at all  Food Insecurity: Food Insecurity Present (06/07/2021)   Hunger Vital Sign    Worried About Running Out of Food in the Last Year: Sometimes true    Ran Out of Food in the Last Year: Sometimes true  Transportation Needs: No Transportation Needs (06/07/2021)   PRAPARE - Hydrologist (Medical): No    Lack of Transportation (Non-Medical): No  Physical Activity: Sufficiently Active (06/07/2021)   Exercise Vital Sign    Days of Exercise per Week: 7 days    Minutes of Exercise per Session: 60 min  Stress: No Stress Concern Present (06/07/2021)   [place]    Feeling of Stress : Not at all  Social Connections: Unknown (04/20/2021)   Social Connection and Isolation Panel [NHANES]    Frequency of Communication with Friends and Family: More than three times a week    Frequency of Social Gatherings with Friends and Family: Twice a week    Attends Religious Services: Patient refused    Active Member of Clubs or Organizations: No    Attends Archivist  Meetings: Not on file    Marital Status: Married   Vitals:   01/07/22 1124 01/07/22 1155  BP: (!) 138/110 (!) 138/98  Pulse: 62   Resp: 16   SpO2: 97%    Body mass index is 41.28 kg/m.  Physical Exam Vitals and nursing note reviewed.  Constitutional:      General: She is not in acute distress.    Appearance: She is well-developed.  HENT:     Head: Normocephalic and atraumatic.     Mouth/Throat:     Mouth: Mucous membranes are moist.     Pharynx: Oropharynx is clear.  Eyes:     Conjunctiva/sclera: Conjunctivae normal.  Cardiovascular:     Rate and Rhythm: Normal rate and regular rhythm.     Pulses:          Dorsalis pedis pulses are 2+ on the right side and 2+ on the left side.     Heart sounds: No murmur heard. Pulmonary:     Effort: Pulmonary effort is normal. No respiratory distress.     Breath sounds: Normal breath sounds.  Abdominal:     Palpations: Abdomen is soft. There is no hepatomegaly or mass.     Tenderness: There is no abdominal tenderness.  Lymphadenopathy:     Cervical: No cervical adenopathy.  Skin:    General: Skin is warm.     Findings: No erythema or rash.  Neurological:     General: No focal deficit present.     Mental Status: She is alert and oriented to person, place, and time.     Cranial Nerves: No cranial nerve deficit.  Psychiatric:     Comments: Well groomed, good eye contact.   ASSESSMENT AND PLAN:  Ms.Briggett was seen today for follow-up.  Diagnoses and all orders for this visit: Orders Placed This Encounter  Procedures   Basic metabolic panel   Disorder of vestibular function, unspecified laterality Residual after craniotomy for acoustic neuroma. Stable. Continue Alprazolam 0.25 mg daily at bedtime. Fall precautions.  -     ALPRAZolam (XANAX) 0.25 MG tablet; TAKE 1 TABLET(0.25 MG) BY MOUTH DAILY AS NEEDED FOR ANXIETY  Insomnia Trazodone did not help. She would like to try Doxepin , recommend starting 3 mg and titrate to  6 mg and 10 mg if needed. Some side effects discussed. Good sleep hygiene is also recommended.  Hypertension Re-checked 145/100. We discussed a few pharmacologic options, she agrees with starting HCTZ 25 mg daily. No changes in Amlodipine dose. Continue low salt diet. Continue monitoring BP regularly.  Emphysema of lung ([place]) Symptoms improved with Spiriva , so continue 2 puff daily. Continue Albuterol inh 2 puff qid prn. Wt loss will also help.  I spent a total of 42 minutes in both face to face and non face to face activities for this visit on the date of this encounter. During this time history was obtained and documented, examination was performed, prior labs reviewed, and assessment/plan discussed.  Return in about 3 months (around 04/12/2022).  Jamyiah Labella G. [name], MD  [facility]. [place] office.

## 2022-01-07 ENCOUNTER — Encounter: Payer: Self-pay | Admitting: Family Medicine

## 2022-01-07 ENCOUNTER — Ambulatory Visit (INDEPENDENT_AMBULATORY_CARE_PROVIDER_SITE_OTHER): Payer: Medicare HMO | Admitting: Family Medicine

## 2022-01-07 VITALS — BP 138/98 | HR 62 | Resp 16 | Ht 64.0 in | Wt 240.5 lb

## 2022-01-07 DIAGNOSIS — H8190 Unspecified disorder of vestibular function, unspecified ear: Secondary | ICD-10-CM | POA: Diagnosis not present

## 2022-01-07 DIAGNOSIS — G4700 Insomnia, unspecified: Secondary | ICD-10-CM

## 2022-01-07 DIAGNOSIS — J432 Centrilobular emphysema: Secondary | ICD-10-CM

## 2022-01-07 DIAGNOSIS — I10 Essential (primary) hypertension: Secondary | ICD-10-CM

## 2022-01-07 MED ORDER — ALPRAZOLAM 0.25 MG PO TABS: ORAL_TABLET | ORAL | 3 refills | Status: DC

## 2022-01-07 MED ORDER — DOXEPIN HCL 10 MG/ML PO CONC: ORAL | 1 refills | Status: DC

## 2022-01-07 MED ORDER — HYDROCHLOROTHIAZIDE 25 MG PO TABS: 25.0000 mg | ORAL_TABLET | Freq: Every day | ORAL | 1 refills | Status: DC

## 2022-01-07 NOTE — Assessment & Plan Note (Signed)
Trazodone did not help. She would like to try Doxepin , recommend starting 3 mg and titrate to 6 mg and 10 mg if needed. Some side effects discussed. Good sleep hygiene is also recommended.

## 2022-01-07 NOTE — Progress Notes (Incomplete)
HPI: [name] is a 61 y.o. female, who is here today to follow on medication. She was seen last on 12/10/21.  COPD: Fluticasone-solumedrol 250-50 mcg  iwa not helping,so discontinued. Spiriva 2.5 mcg 2 puff daily has helped. She has not needed Albuterol inh.  Insomnia: She took Trazodone for a few days, discontinued because did not feel well, it caused edema. Medication did not help much with sleep, difficulty falling and staying asleep.  She is on Alprazolam 0.25 mg daily prn for vestibular disorder, hx of acoustic neuroma, s/p craniotomy.  HTN:BP has been elevated for the past few days, 138-140's/90's. She is on Amlodipine 10 mg daily. Frustrated because her pharmacy is refusing to fill Rx, which was sent by her cardiologist in 09/2021 with 3 refills.  No changes in diet or new medications. Negative for severe/frequent headache, visual changes, chest pain, dyspnea, palpitation,or focal weakness,.  Lab Results  Component Value Date   CREATININE 0.69 09/22/2021   BUN 10 09/22/2021   NA 140 09/22/2021   K 3.9 09/22/2021   CL 109 09/22/2021   CO2 25 09/22/2021   Review of Systems  Constitutional:  Positive for fatigue. Negative for activity change, appetite change and fever.  HENT:  Negative for mouth sores, nosebleeds and trouble swallowing.   Eyes:  Negative for redness and visual disturbance.  Respiratory:  Negative for cough, shortness of breath and wheezing.   Cardiovascular:  Negative for chest pain, palpitations and leg swelling.  Gastrointestinal:  Negative for abdominal pain, nausea and vomiting.       Negative for changes in bowel habits.  Genitourinary:  Negative for decreased urine volume, dysuria and hematuria.  Neurological:  Negative for seizures, syncope, weakness, numbness and headaches.  Rest see pertinent positives and negatives per HPI.  Current Outpatient Medications on File Prior to Visit  Medication Sig Dispense Refill  . albuterol  (VENTOLIN HFA) 108 (90 Base) MCG/ACT inhaler Inhale 2 puffs into the lungs every 6 (six) hours as needed for wheezing or shortness of breath. 18 g 2  . amLODipine (NORVASC) 10 MG tablet Take 1 tablet (10 mg total) by mouth daily. 90 tablet 3  . aspirin EC 81 MG tablet Take 1 tablet (81 mg total) by mouth daily. Swallow whole. 90 tablet 3  . docusate sodium (COLACE) 100 MG capsule Take 1 capsule (100 mg total) by mouth 2 (two) times daily.    . Evolocumab (REPATHA SURECLICK) 470 MG/ML SOAJ Inject 140 mg into the skin every 14 (fourteen) days. 2 mL 11  . promethazine (PHENERGAN) 12.5 MG tablet Take 1 tablet (12.5 mg total) by mouth every 4 (four) hours as needed for nausea or vomiting. 15 tablet 0  . Respiratory Therapy Supplies (CARETOUCH 2 CPAP HOSE HANGER) MISC     . Tiotropium Bromide Monohydrate (SPIRIVA RESPIMAT) 2.5 MCG/ACT AERS Inhale 2 puffs into the lungs daily. 4 g 3   No current facility-administered medications on file prior to visit.    Past Medical History:  Diagnosis Date  . Acoustic neuroma ([place])   . CAD (coronary artery disease)   . Chronic kidney disease   . COPD (chronic obstructive pulmonary disease) ([place])   . Dyspnea   . Family history of bladder cancer   . Family history of breast cancer   . Family history of thyroid cancer   . Hyperlipemia   . Hypertension   . Neuropathy   . Optic neuritis   . Pre-diabetes   . Sleep apnea  wears cpap   Allergies  Allergen Reactions  . Gabapentin Anaphylaxis and Swelling    eyes swelled, throat, hands,  . Tramadol Anaphylaxis and Swelling    Throat and hands  . Crestor [Rosuvastatin] Other (See Comments)    Muscle aches   . Duloxetine Nausea And Vomiting  . Lipitor [Atorvastatin]     myalgias  . Pravastatin     myalgias  . Zetia [Ezetimibe]     myalgias  . Lisinopril Nausea Only    Social History   Socioeconomic History  . Marital status: Married    Spouse name: Not on file  . Number of children: 4  . Years  of education: 61  . Highest education level: 12th grade  Occupational History  . Occupation: Disabled  Tobacco Use  . Smoking status: Former    Packs/day: 1.00    Years: 35.00    Total pack years: 35.00    Types: Cigarettes    Quit date: 01/09/2018    Years since quitting: 4.0    Passive exposure: Current  . Smokeless tobacco: Never  Vaping Use  . Vaping Use: Never used  Substance and Sexual Activity  . Alcohol use: Not Currently  . Drug use: Never  . Sexual activity: Yes  Other Topics Concern  . Not on file  Social History Narrative   Lives at home with her husband and her great-grandson.   Right-handed.   2-3 cups caffeine per day.    Social Determinants of Health   Financial Resource Strain: Low Risk  (06/07/2021)   Overall Financial Resource Strain (CARDIA)   . Difficulty of Paying Living Expenses: Not hard at all  Food Insecurity: Food Insecurity Present (06/07/2021)   Hunger Vital Sign   . Worried About Charity fundraiser in the Last Year: Sometimes true   . Ran Out of Food in the Last Year: Sometimes true  Transportation Needs: No Transportation Needs (06/07/2021)   PRAPARE - Transportation   . Lack of Transportation (Medical): No   . Lack of Transportation (Non-Medical): No  Physical Activity: Sufficiently Active (06/07/2021)   Exercise Vital Sign   . Days of Exercise per Week: 7 days   . Minutes of Exercise per Session: 60 min  Stress: No Stress Concern Present (06/07/2021)   [place]   . Feeling of Stress : Not at all  Social Connections: Unknown (04/20/2021)   Social Connection and Isolation Panel [NHANES]   . Frequency of Communication with Friends and Family: More than three times a week   . Frequency of Social Gatherings with Friends and Family: Twice a week   . Attends Religious Services: Patient refused   . Active Member of Clubs or Organizations: No   . Attends Archivist  Meetings: Not on file   . Marital Status: Married    Vitals:   01/07/22 1124 01/07/22 1155  BP: (!) 138/110 (!) 138/98  Pulse: 62   Resp: 16   SpO2: 97%    Body mass index is 41.28 kg/m.  Physical Exam Vitals and nursing note reviewed.  Constitutional:      General: She is not in acute distress.    Appearance: She is well-developed.  HENT:     Head: Normocephalic and atraumatic.     Mouth/Throat:     Mouth: Mucous membranes are moist.     Pharynx: Oropharynx is clear.  Eyes:     Conjunctiva/sclera: Conjunctivae normal.  Cardiovascular:  Rate and Rhythm: Normal rate and regular rhythm.     Pulses:          Dorsalis pedis pulses are 2+ on the right side and 2+ on the left side.     Heart sounds: No murmur heard. Pulmonary:     Effort: Pulmonary effort is normal. No respiratory distress.     Breath sounds: Normal breath sounds.  Abdominal:     Palpations: Abdomen is soft. There is no hepatomegaly or mass.     Tenderness: There is no abdominal tenderness.  Lymphadenopathy:     Cervical: No cervical adenopathy.  Skin:    General: Skin is warm.     Findings: No erythema or rash.  Neurological:     General: No focal deficit present.     Mental Status: She is alert and oriented to person, place, and time.     Cranial Nerves: No cranial nerve deficit.  Psychiatric:     Comments: Well groomed, good eye contact.     ASSESSMENT AND PLAN:   Rolla was seen today for follow-up.  Diagnoses and all orders for this visit:  Insomnia, unspecified type -     doxepin (SINEQUAN) 10 MG/ML solution; Start with 0.3 ml (3 mg) 30-45 min before bedtime and increase if needed to 0.6 ml (6 mg) and 1 ml (10 mg) for sleep.  Primary hypertension -     hydrochlorothiazide (HYDRODIURIL) 25 MG tablet; Take 1 tablet (25 mg total) by mouth daily. -     Basic metabolic panel; Future  Disorder of vestibular function, unspecified laterality -     ALPRAZolam (XANAX) 0.25 MG tablet; TAKE  1 TABLET(0.25 MG) BY MOUTH DAILY AS NEEDED FOR ANXIETY  Centrilobular emphysema ([place])    Orders Placed This Encounter  Procedures  . Basic metabolic panel    Insomnia Trazodone did not help. She would like to try Doxepin , recommend starting 3 mg and titrate to 6 mg and 10 mg if needed. Some side effects discussed. Good sleep hygiene is also recommended.  Hypertension Re-checked 145/100. We discussed a few pharmacologic options, she agrees with starting HCTZ 25 mg daily. No changes in Amlodipine dose. Continue low salt diet. Continue monitoring BP regularly.  Emphysema of lung ([place]) Symptoms improved with Spiriva , so continue 2 puff daily. Continue Albuterol inh 2 puff qid prn. Wt loss will also help.  I spent a total of 42 minutes in both face to face and non face to face activities for this visit on the date of this encounter. During this time history was obtained and documented, examination was performed, prior labs reviewed, and assessment/plan discussed.  Return in about 3 months (around 04/12/2022).  Soriya Worster G. [name], MD  [facility]. [place] office.

## 2022-01-07 NOTE — Assessment & Plan Note (Signed)
Symptoms improved with Spiriva , so continue 2 puff daily. Continue Albuterol inh 2 puff qid prn. Wt loss will also help.

## 2022-01-07 NOTE — Assessment & Plan Note (Signed)
Re-checked 145/100. We discussed a few pharmacologic options, she agrees with starting HCTZ 25 mg daily. No changes in Amlodipine dose. Continue low salt diet. Continue monitoring BP regularly.

## 2022-01-07 NOTE — Patient Instructions (Addendum)
A few things to remember from today's visit:  Insomnia, unspecified type  Primary hypertension  Chronic obstructive pulmonary disease, unspecified COPD type ([place])  Disorder of vestibular function, unspecified laterality  Hydrochlorothiazide 25 mg added today. No changes in Amlodipine. Continue Spiriva. Continue monitoring blood pressure regularly. Continue good sleep hygiene. Another option is Doxepin.  If you need refills for medications you take chronically, please call your pharmacy. Do not use My Chart to request refills or for acute issues that need immediate attention. If you send a my chart message, it may take a few days to be addressed, specially if I am not in the office.  Please be sure medication list is accurate. If a new problem present, please set up appointment sooner than planned today.

## 2022-01-08 ENCOUNTER — Other Ambulatory Visit: Payer: Self-pay | Admitting: Family Medicine

## 2022-01-28 ENCOUNTER — Other Ambulatory Visit (INDEPENDENT_AMBULATORY_CARE_PROVIDER_SITE_OTHER): Payer: Medicare HMO

## 2022-01-28 DIAGNOSIS — I10 Essential (primary) hypertension: Secondary | ICD-10-CM

## 2022-01-28 LAB — BASIC METABOLIC PANEL
BUN: 11 mg/dL (ref 6–23)
CO2: 28 mEq/L (ref 19–32)
Calcium: 9.3 mg/dL (ref 8.4–10.5)
Chloride: 107 mEq/L (ref 96–112)
Creatinine, Ser: 0.77 mg/dL (ref 0.40–1.20)
GFR: 82.3 mL/min (ref >60.00)
Glucose, Bld: 108 mg/dL — ABNORMAL HIGH (ref 70–99)
Potassium: 3.9 mEq/L (ref 3.5–5.1)
Sodium: 142 mEq/L (ref 135–145)

## 2022-02-04 DIAGNOSIS — Z85528 Personal history of other malignant neoplasm of kidney: Secondary | ICD-10-CM | POA: Diagnosis not present

## 2022-02-07 DIAGNOSIS — M47816 Spondylosis without myelopathy or radiculopathy, lumbar region: Secondary | ICD-10-CM | POA: Diagnosis not present

## 2022-02-07 DIAGNOSIS — Z85528 Personal history of other malignant neoplasm of kidney: Secondary | ICD-10-CM | POA: Diagnosis not present

## 2022-02-07 DIAGNOSIS — C642 Malignant neoplasm of left kidney, except renal pelvis: Secondary | ICD-10-CM | POA: Diagnosis not present

## 2022-02-07 DIAGNOSIS — K5730 Diverticulosis of large intestine without perforation or abscess without bleeding: Secondary | ICD-10-CM | POA: Diagnosis not present

## 2022-02-07 DIAGNOSIS — Z9889 Other specified postprocedural states: Secondary | ICD-10-CM | POA: Diagnosis not present

## 2022-02-09 DIAGNOSIS — J387 Other diseases of larynx: Secondary | ICD-10-CM | POA: Diagnosis not present

## 2022-02-09 DIAGNOSIS — Z87891 Personal history of nicotine dependence: Secondary | ICD-10-CM | POA: Diagnosis not present

## 2022-02-09 DIAGNOSIS — J383 Other diseases of vocal cords: Secondary | ICD-10-CM | POA: Diagnosis not present

## 2022-02-09 DIAGNOSIS — R498 Other voice and resonance disorders: Secondary | ICD-10-CM | POA: Diagnosis not present

## 2022-02-09 DIAGNOSIS — J3800 Paralysis of vocal cords and larynx, unspecified: Secondary | ICD-10-CM | POA: Diagnosis not present

## 2022-02-09 DIAGNOSIS — R1310 Dysphagia, unspecified: Secondary | ICD-10-CM | POA: Diagnosis not present

## 2022-02-09 DIAGNOSIS — R490 Dysphonia: Secondary | ICD-10-CM | POA: Diagnosis not present

## 2022-02-09 DIAGNOSIS — J384 Edema of larynx: Secondary | ICD-10-CM | POA: Diagnosis not present

## 2022-02-11 DIAGNOSIS — R1012 Left upper quadrant pain: Secondary | ICD-10-CM | POA: Diagnosis not present

## 2022-02-11 DIAGNOSIS — C642 Malignant neoplasm of left kidney, except renal pelvis: Secondary | ICD-10-CM | POA: Diagnosis not present

## 2022-02-24 ENCOUNTER — Ambulatory Visit
Admission: RE | Admit: 2022-02-24 | Discharge: 2022-02-24 | Disposition: A | Discharge status: Home / Self Care | Payer: Medicare HMO | Source: Ambulatory Visit

## 2022-02-24 ENCOUNTER — Other Ambulatory Visit: Payer: Self-pay | Admitting: Family Medicine

## 2022-02-24 DIAGNOSIS — Z85528 Personal history of other malignant neoplasm of kidney: Secondary | ICD-10-CM | POA: Diagnosis not present

## 2022-02-24 DIAGNOSIS — J432 Centrilobular emphysema: Secondary | ICD-10-CM | POA: Diagnosis not present

## 2022-02-24 DIAGNOSIS — I700 Atherosclerosis of aorta: Secondary | ICD-10-CM | POA: Diagnosis not present

## 2022-02-24 DIAGNOSIS — Z87891 Personal history of nicotine dependence: Secondary | ICD-10-CM | POA: Diagnosis not present

## 2022-02-28 ENCOUNTER — Other Ambulatory Visit: Payer: Self-pay | Admitting: Acute Care

## 2022-02-28 DIAGNOSIS — Z87891 Personal history of nicotine dependence: Secondary | ICD-10-CM

## 2022-02-28 DIAGNOSIS — Z122 Encounter for screening for malignant neoplasm of respiratory organs: Secondary | ICD-10-CM

## 2022-03-09 ENCOUNTER — Other Ambulatory Visit (HOSPITAL_COMMUNITY): Payer: Self-pay | Admitting: Surgery

## 2022-03-09 DIAGNOSIS — Z1239 Encounter for other screening for malignant neoplasm of breast: Secondary | ICD-10-CM

## 2022-03-16 ENCOUNTER — Ambulatory Visit (HOSPITAL_COMMUNITY)
Admission: RE | Admit: 2022-03-16 | Discharge: 2022-03-16 | Disposition: A | Discharge status: Home / Self Care | Payer: Medicare HMO | Source: Ambulatory Visit | Attending: Surgery | Admitting: Surgery

## 2022-03-16 DIAGNOSIS — Z1239 Encounter for other screening for malignant neoplasm of breast: Secondary | ICD-10-CM | POA: Diagnosis not present

## 2022-03-16 DIAGNOSIS — N6489 Other specified disorders of breast: Secondary | ICD-10-CM | POA: Diagnosis not present

## 2022-03-16 MED ORDER — GADOBUTROL 1 MMOL/ML IV SOLN
10.0000 mL | Freq: Once | INTRAVENOUS | Status: AC | PRN
  Administered 2022-03-16: 10 mL via INTRAVENOUS

## 2022-03-21 IMAGING — US US BREAST*L* LIMITED INC AXILLA
1 series · 3 of 3 positions shown · non-contrast
Comparison: Previous exam(s).

CLINICAL DATA: Patient has noted recent breast tenderness,
involving the LATERAL quadrants of both breasts. Several weeks ago,
she noted spontaneous dark nipple discharge from both breasts, when
in the shower. History of benign circumareolar excisional biopsy in
the RIGHT breast. Patient has 3 sisters. All have been diagnosed
with breast cancer, 1 in her 40s, the second at age 48-50, and a
third at age 67. The patient does not know any results of genetic
testing.

Patient has an upcoming cervical spine surgery on [REDACTED].
EXAM:
DIGITAL DIAGNOSTIC BILATERAL MAMMOGRAM WITH CAD AND TOMOSYNTHESIS
ULTRASOUND BILATERAL BREAST
TECHNIQUE: Bilateral digital diagnostic mammography and breast tomosynthesis
was performed. Digital images of the breasts were evaluated with
computer-aided detection. Targeted ultrasound examination of the
Bilateral breast was performed.

[Series 1: us breast*left* limited inc axilla · 0.07mm/px · 3 of 3 slices shown]
[im 1/3]
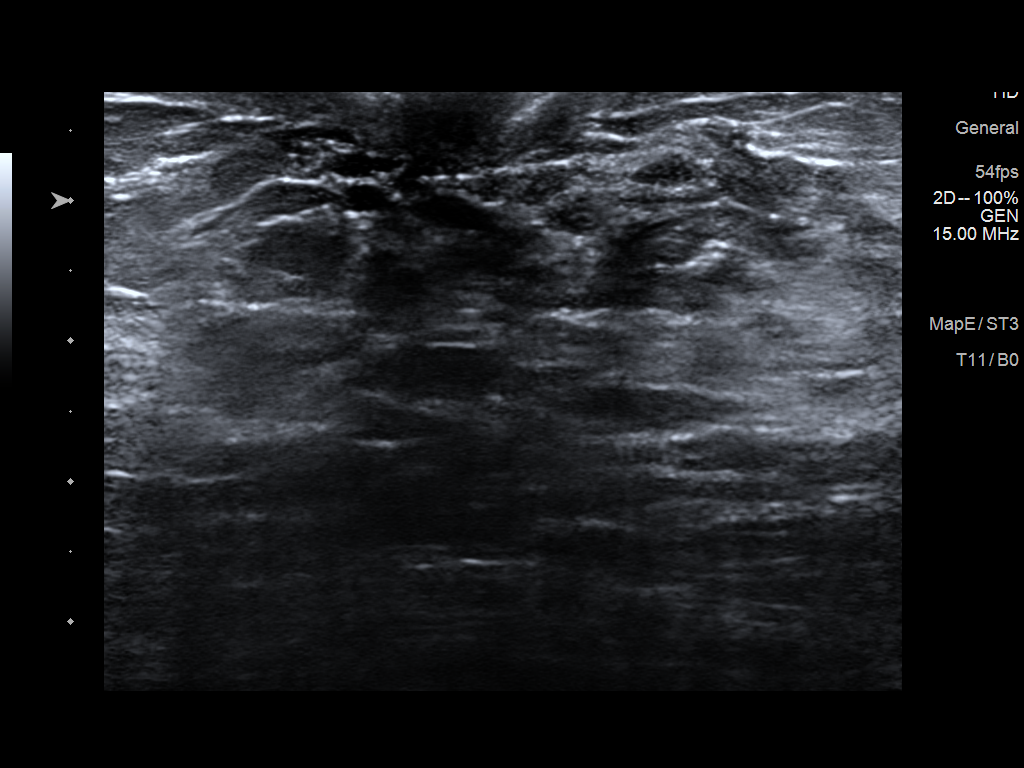
[im 2/3]
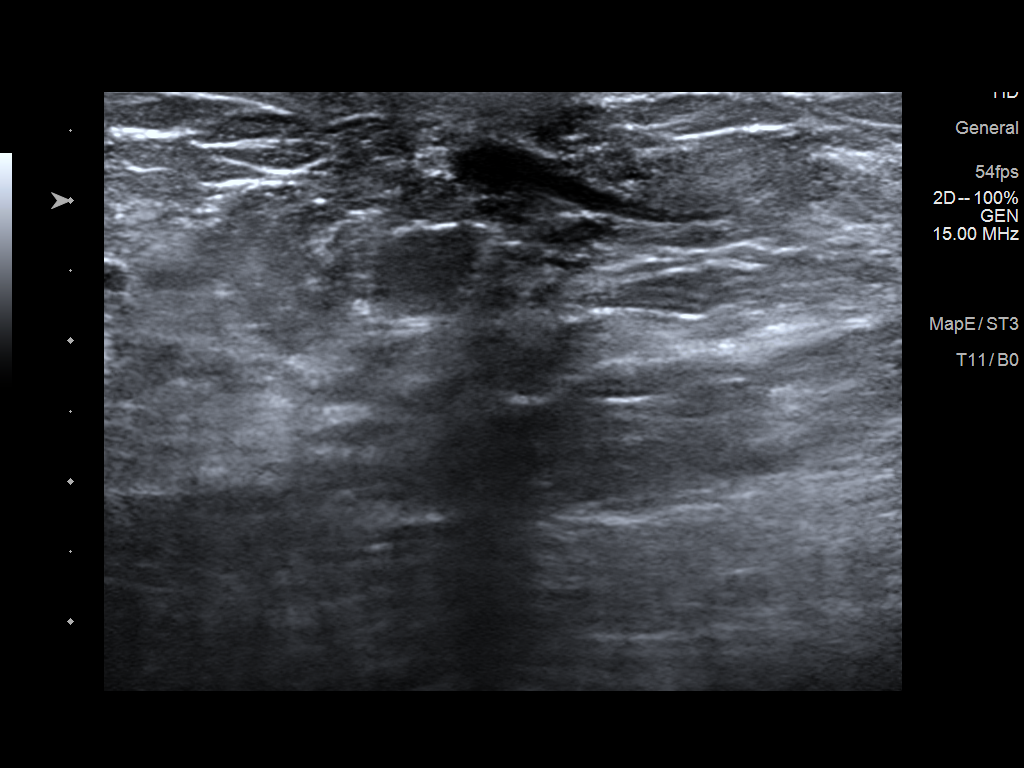
[im 3/3]
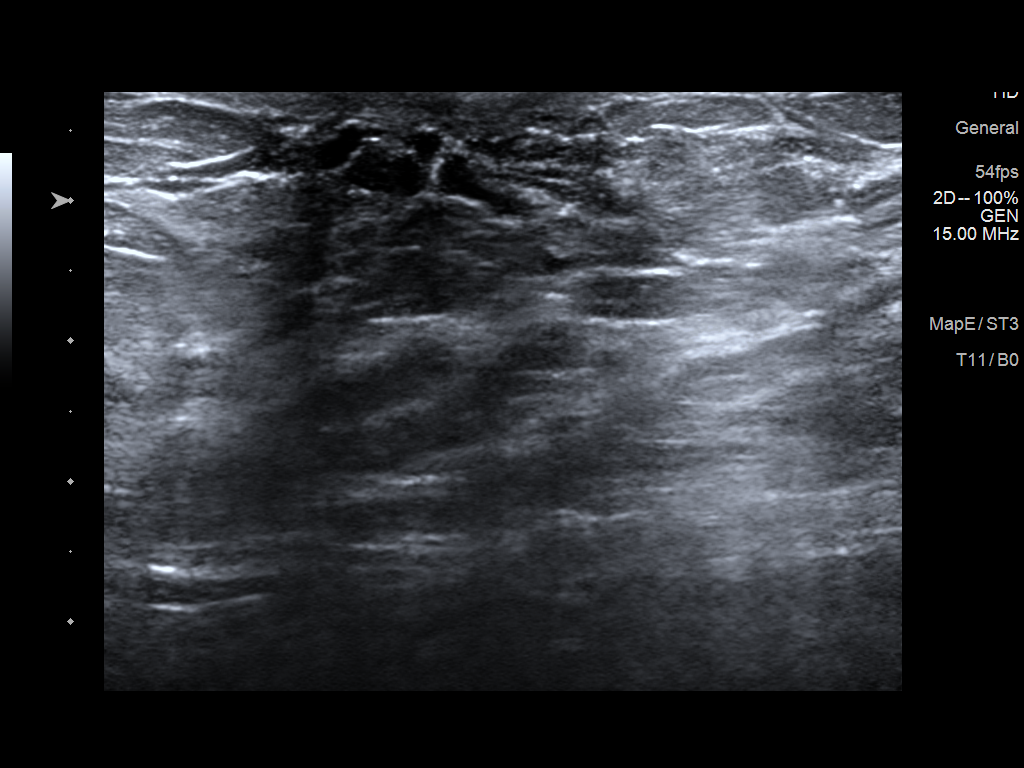

[3 of 3 positions shown; findings below may reference images not displayed]

ACR Breast Density Category d: The breast tissue is extremely dense,
which lowers the sensitivity of mammography.
FINDINGS: RIGHT BREAST:

Mammogram: Stable postsurgical change identified in the UPPER
anterior portion of the RIGHT breast consistent previous benign
excisional biopsy. No suspicious mass, distortion, or
microcalcifications are identified to suggest presence of
malignancy. Mammographic images were processed with CAD.

Ultrasound: Targeted ultrasound is performed, showing an oval mass
with irregular margins in the 12 o'clock location of the RIGHT
breast 1 centimeter from the nipple. Mass shows continuity with an
adjacent duct and contains central hypoechoic components of lacks
internal blood flow on Doppler evaluation. Mass measures 0.6 x 0.4 x
0.5 centimeters.

Evaluation of the RIGHT axilla is negative for adenopathy.

LEFT BREAST:

Mammogram: No suspicious mass, distortion, or microcalcifications
are identified to suggest presence of malignancy. Mammographic
images were processed with CAD.

Ultrasound: Targeted ultrasound is performed, showing normal
appearing fibroglandular tissue in the retroareolar region of the
LEFT breast.
IMPRESSION: 1. Indeterminate mass in the 12 o'clock location of the RIGHT breast
possibly representing intraductal papilloma.
2. LEFT breast is negative.
3. Spontaneous bilateral nipple discharge warrants further
evaluation.
4. Strong family history of breast cancer.

RECOMMENDATION:
1. Ultrasound-guided core biopsy of mass in the RIGHT breast 12
o'clock location, scheduled on [DATE].
2. Recommend bilateral breast MRI.
3. Recommend surgical consultation to discuss options for bilateral
nipple discharge.

I have discussed the findings and recommendations with the patient.
If applicable, a reminder letter will be sent to the patient
regarding the next appointment.

BI-RADS CATEGORY  4: Suspicious.

## 2022-03-21 IMAGING — US US BREAST*R* LIMITED INC AXILLA
1 series · 13 of 14 positions shown · non-contrast
Comparison: Previous exam(s).

CLINICAL DATA: Patient has noted recent breast tenderness,
involving the LATERAL quadrants of both breasts. Several weeks ago,
she noted spontaneous dark nipple discharge from both breasts, when
in the shower. History of benign circumareolar excisional biopsy in
the RIGHT breast. Patient has 3 sisters. All have been diagnosed
with breast cancer, 1 in her 40s, the second at age 48-50, and a
third at age 67. The patient does not know any results of genetic
testing.

Patient has an upcoming cervical spine surgery on [REDACTED].
EXAM:
DIGITAL DIAGNOSTIC BILATERAL MAMMOGRAM WITH CAD AND TOMOSYNTHESIS
ULTRASOUND BILATERAL BREAST
TECHNIQUE: Bilateral digital diagnostic mammography and breast tomosynthesis
was performed. Digital images of the breasts were evaluated with
computer-aided detection. Targeted ultrasound examination of the
Bilateral breast was performed.

[Series 1: us breast*right* limited inc axilla · 0.07mm/px · 13 of 14 slices shown]
[im 1/14]
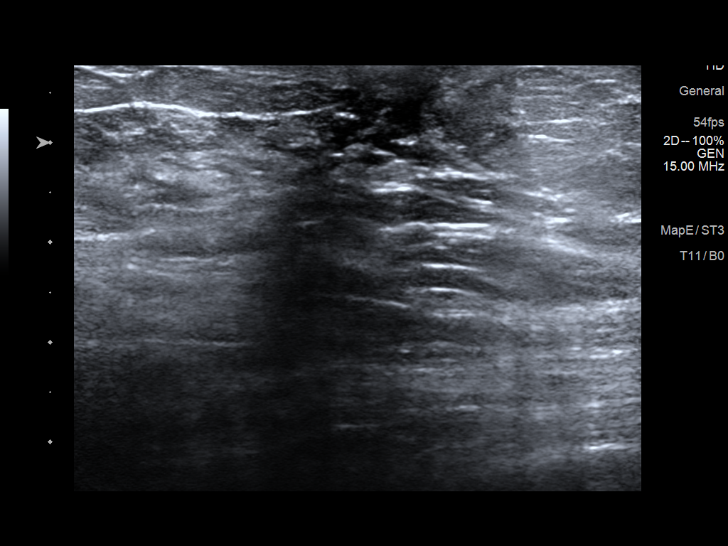
[im 2/14]
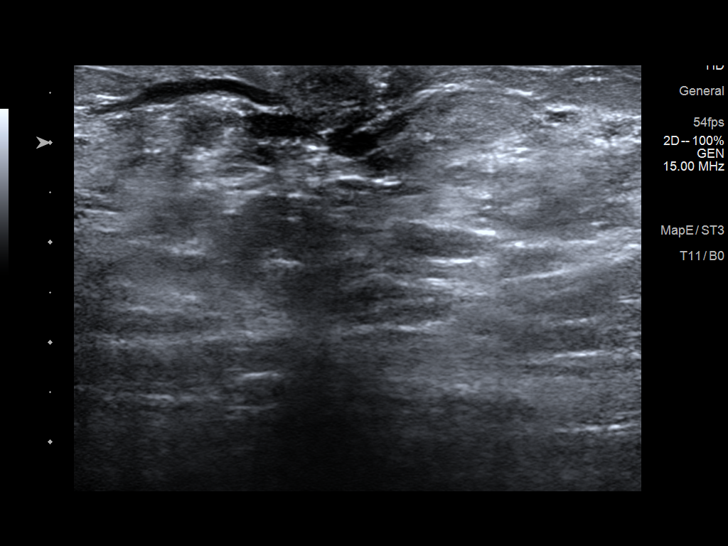
[im 3/14]
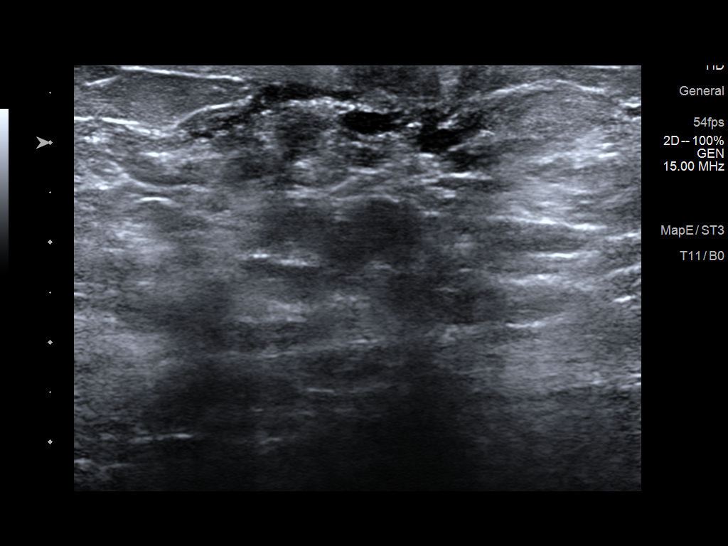
[im 4/14]
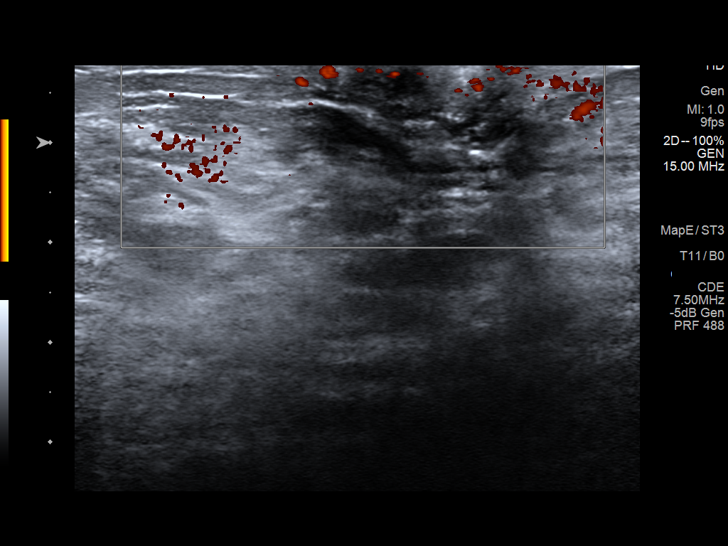
[im 5/14]
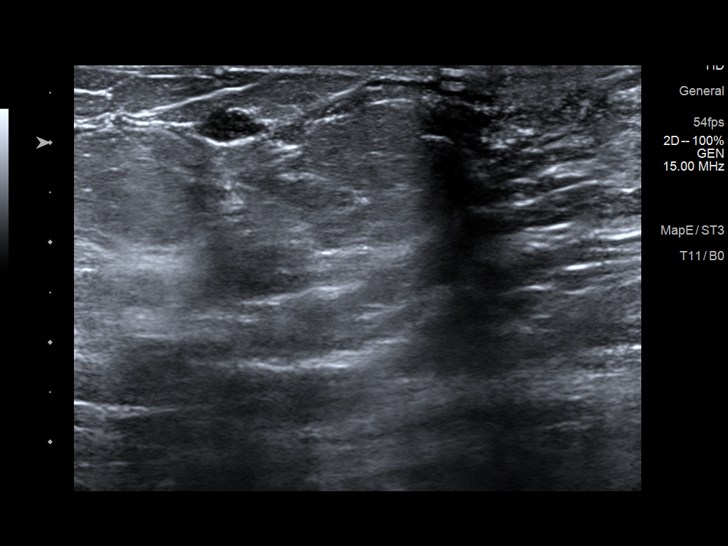
[im 6/14]
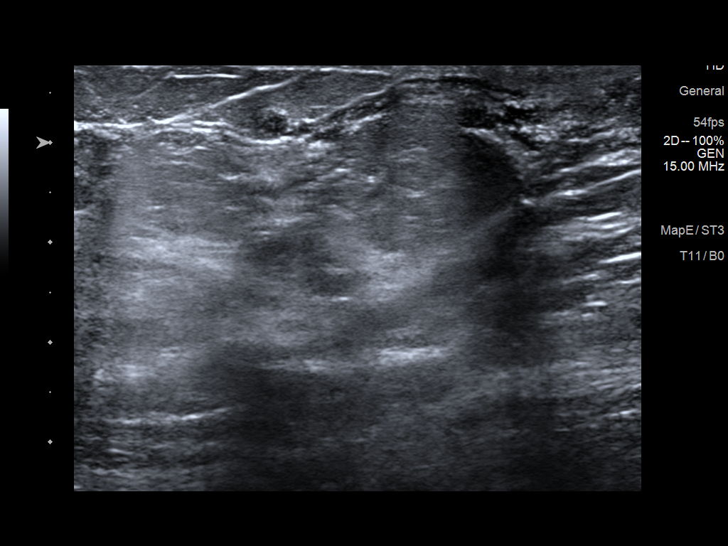
[im 8/14]
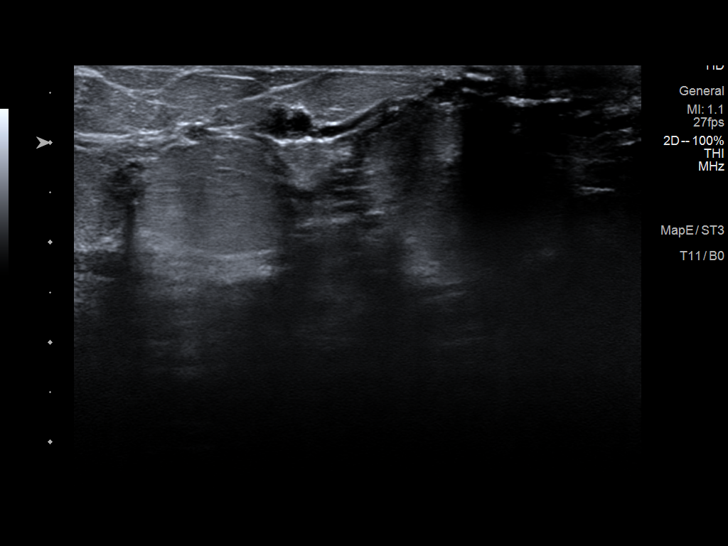
[im 9/14]
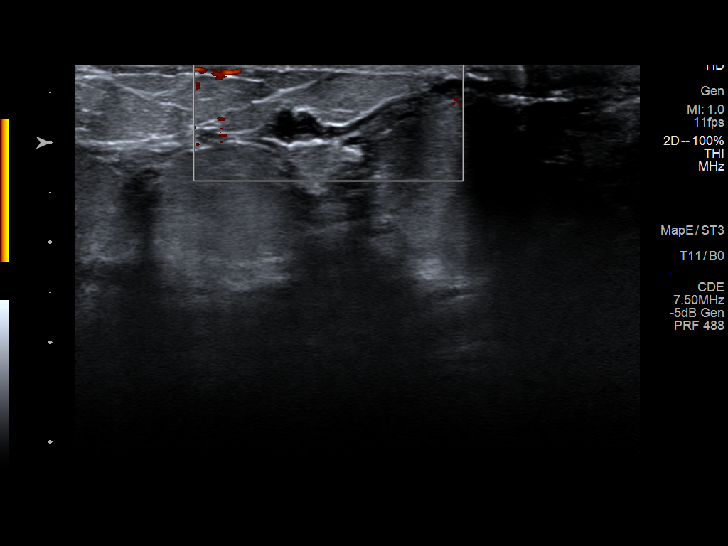
[im 10/14]
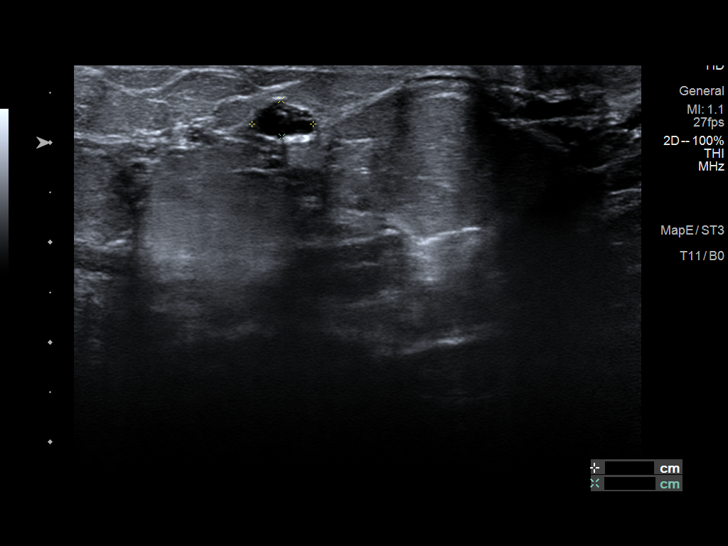
[im 11/14]
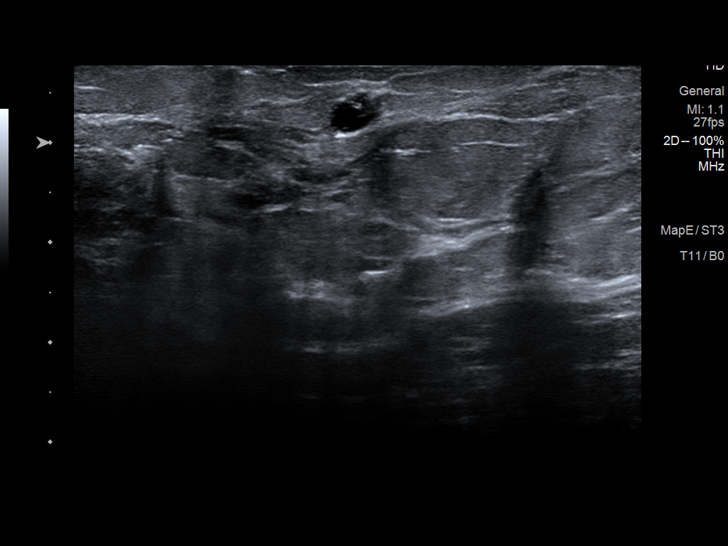
[im 12/14]
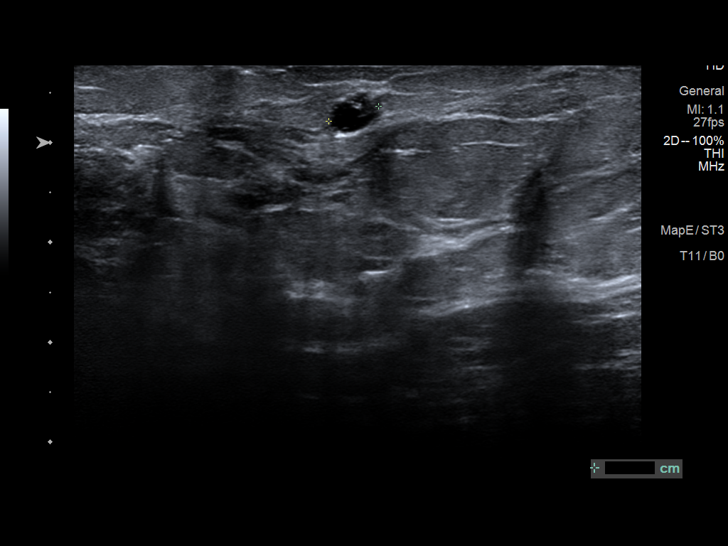
[im 13/14]
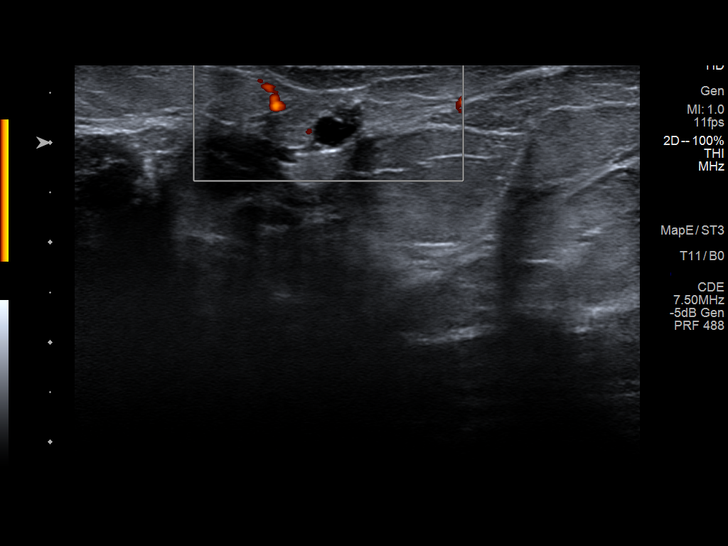
[im 14/14]
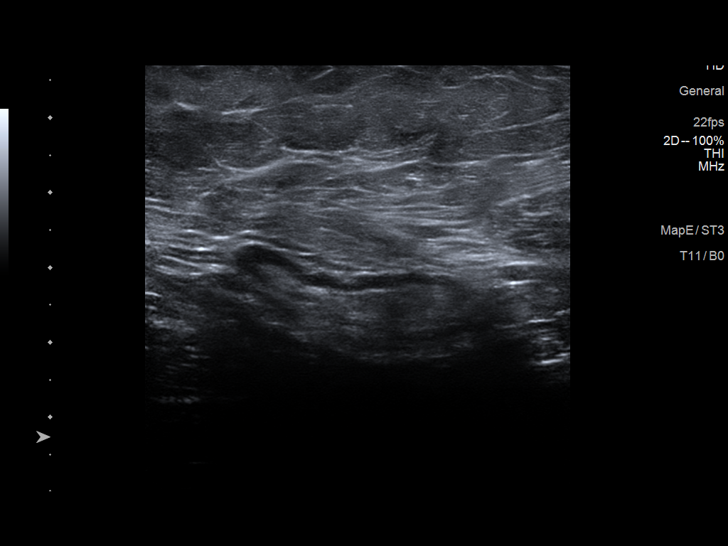

[13 of 14 positions shown; findings below may reference images not displayed]

ACR Breast Density Category d: The breast tissue is extremely dense,
which lowers the sensitivity of mammography.
FINDINGS: RIGHT BREAST:

Mammogram: Stable postsurgical change identified in the UPPER
anterior portion of the RIGHT breast consistent previous benign
excisional biopsy. No suspicious mass, distortion, or
microcalcifications are identified to suggest presence of
malignancy. Mammographic images were processed with CAD.

Ultrasound: Targeted ultrasound is performed, showing an oval mass
with irregular margins in the 12 o'clock location of the RIGHT
breast 1 centimeter from the nipple. Mass shows continuity with an
adjacent duct and contains central hypoechoic components of lacks
internal blood flow on Doppler evaluation. Mass measures 0.6 x 0.4 x
0.5 centimeters.

Evaluation of the RIGHT axilla is negative for adenopathy.

LEFT BREAST:

Mammogram: No suspicious mass, distortion, or microcalcifications
are identified to suggest presence of malignancy. Mammographic
images were processed with CAD.

Ultrasound: Targeted ultrasound is performed, showing normal
appearing fibroglandular tissue in the retroareolar region of the
LEFT breast.
IMPRESSION: 1. Indeterminate mass in the 12 o'clock location of the RIGHT breast
possibly representing intraductal papilloma.
2. LEFT breast is negative.
3. Spontaneous bilateral nipple discharge warrants further
evaluation.
4. Strong family history of breast cancer.

RECOMMENDATION:
1. Ultrasound-guided core biopsy of mass in the RIGHT breast 12
o'clock location, scheduled on [DATE].
2. Recommend bilateral breast MRI.
3. Recommend surgical consultation to discuss options for bilateral
nipple discharge.

I have discussed the findings and recommendations with the patient.
If applicable, a reminder letter will be sent to the patient
regarding the next appointment.

BI-RADS CATEGORY  4: Suspicious.

## 2022-03-28 IMAGING — US US  BREAST BX W/ LOC DEV 1ST LESION IMG BX SPEC US GUIDE*R*
1 series · 8 of 8 positions shown · non-contrast
Comparison: Previous exam(s).
COMPARISON: Previous exam(s).

Addendum:
CLINICAL DATA: Biopsy of a possible mass in the right breast at 12
o'clock, 1 cm from the nipple.

EXAM:
ULTRASOUND GUIDED RIGHT BREAST CORE NEEDLE BIOPSY

[Series 1: us breast bx w/ loc dev 1st lesion img bx spec us  · 0.07mm/px · 8 of 8 slices shown]
[im 1/8]
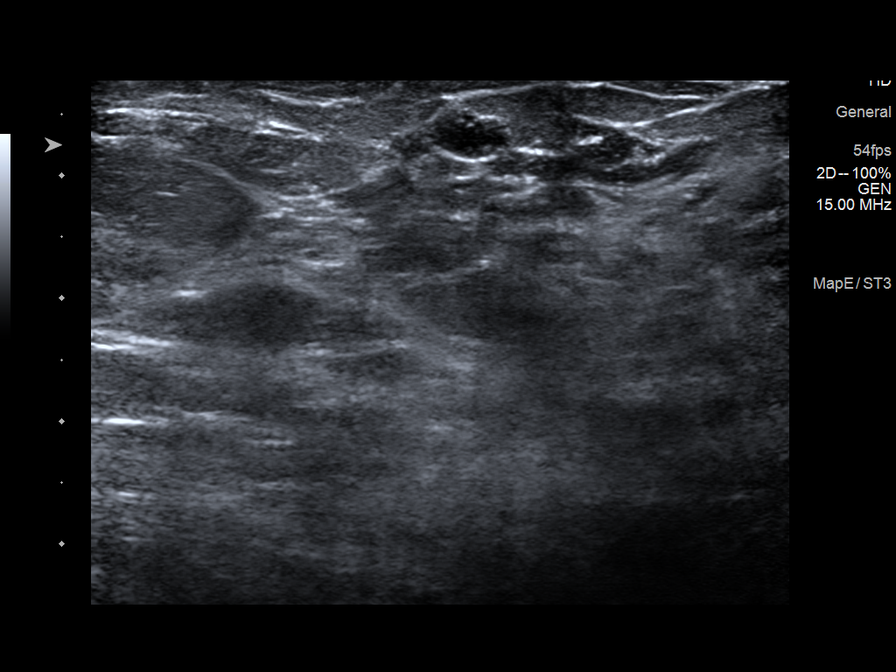
[im 2/8]
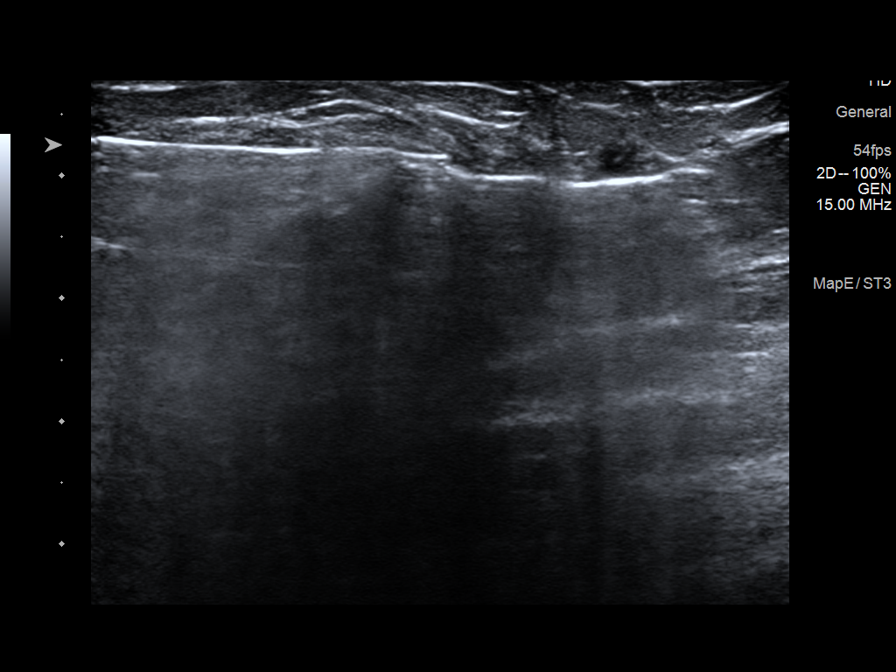
[im 3/8]
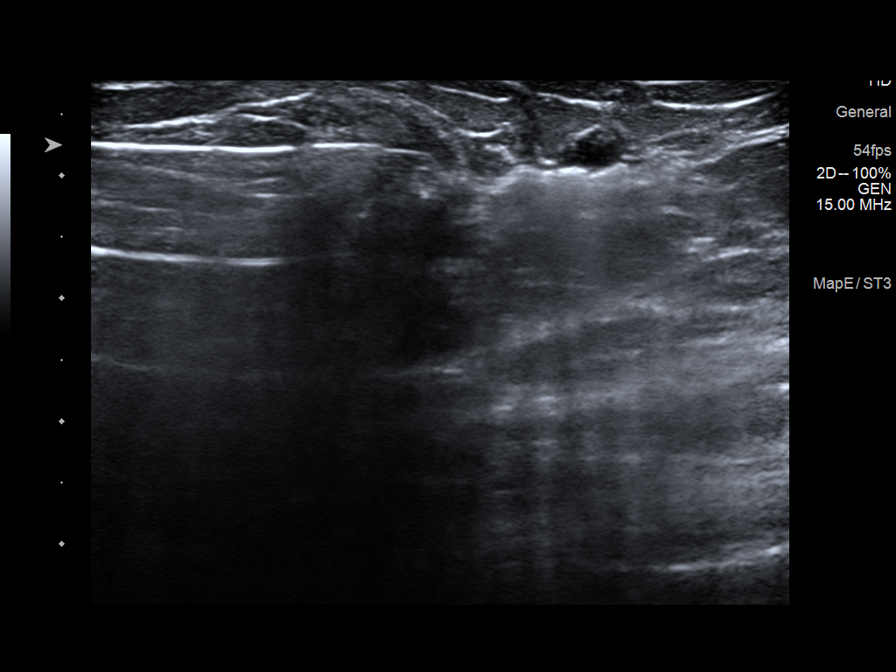
[im 4/8]
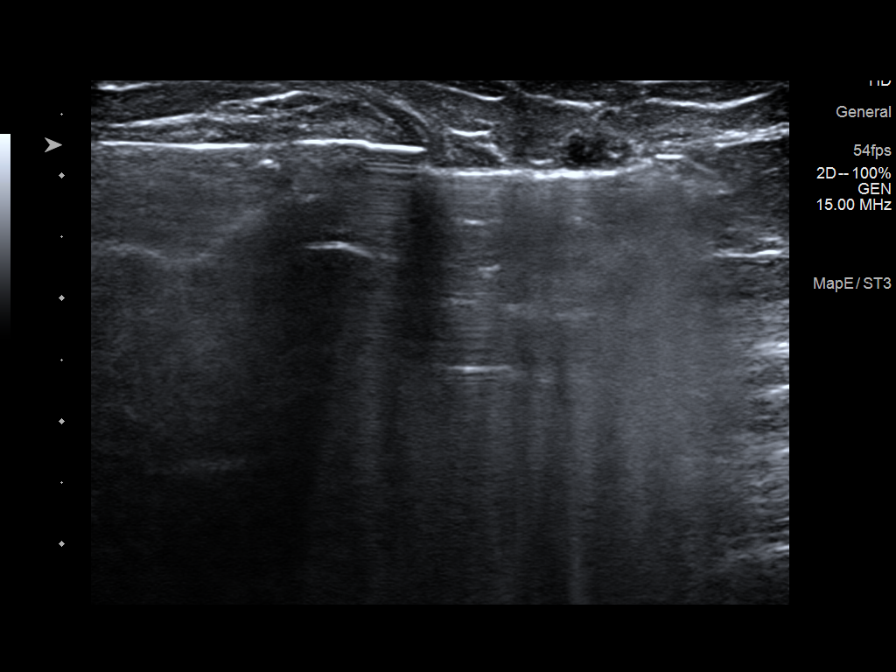
[im 5/8]
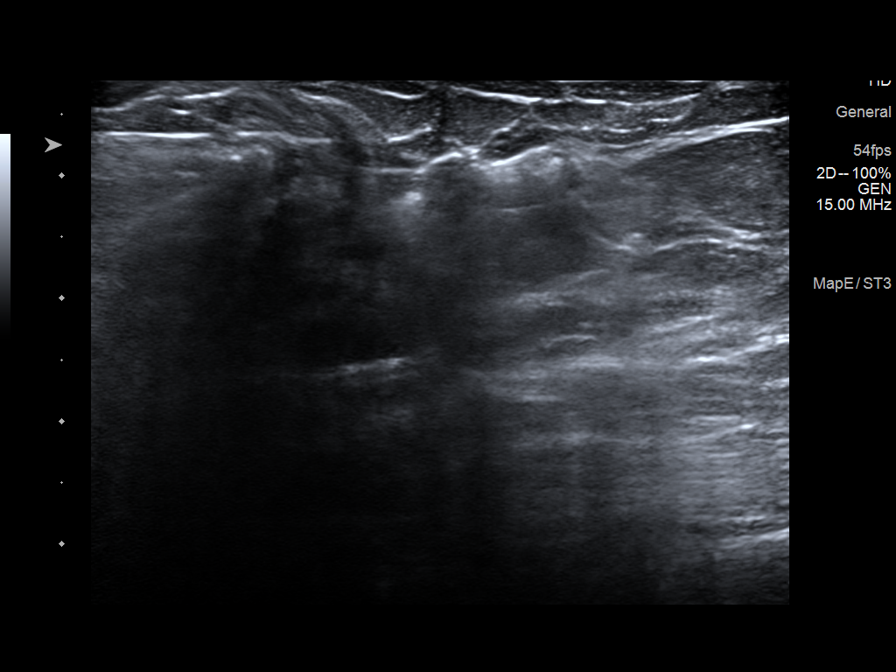
[im 6/8]
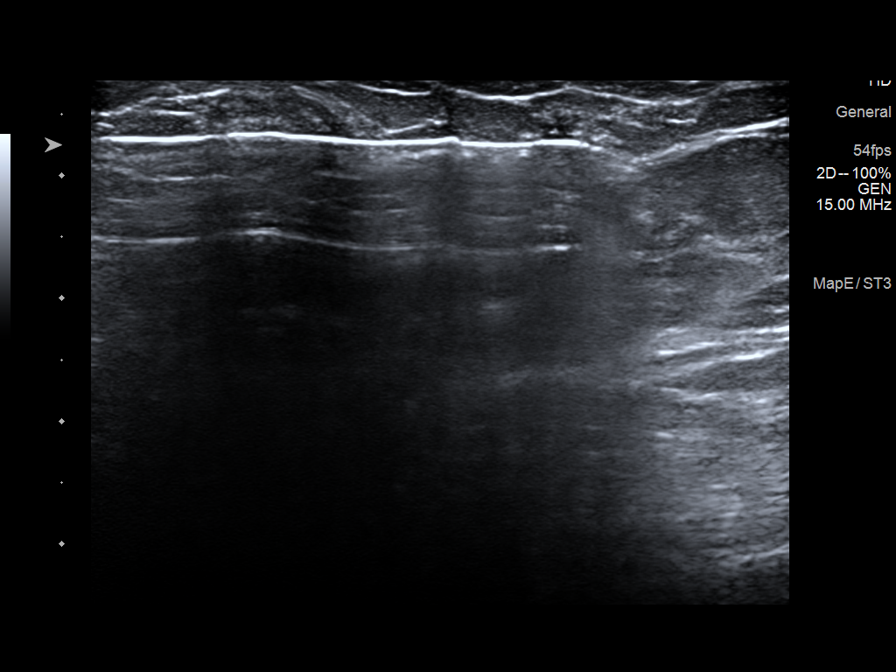
[im 7/8]
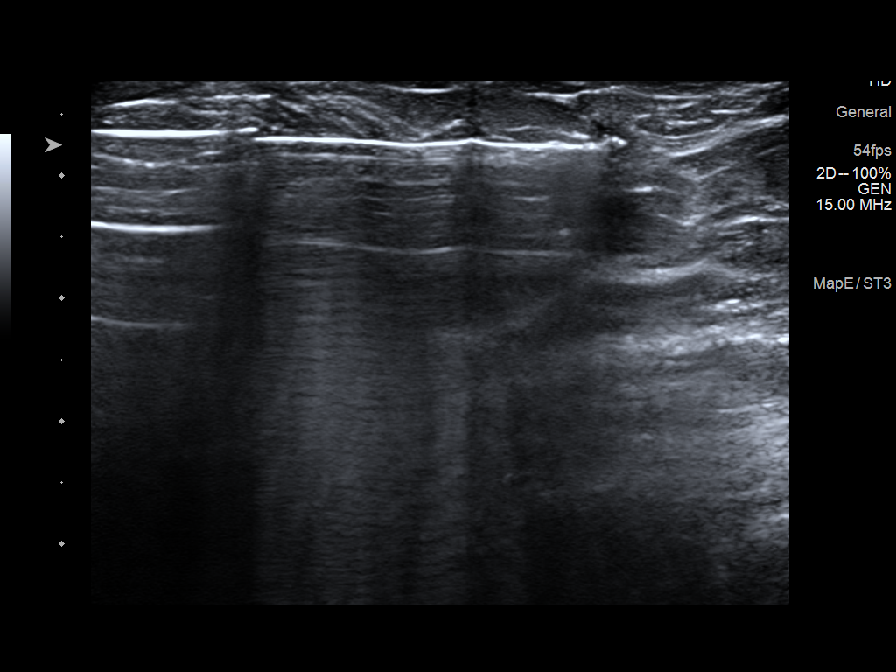
[im 8/8]
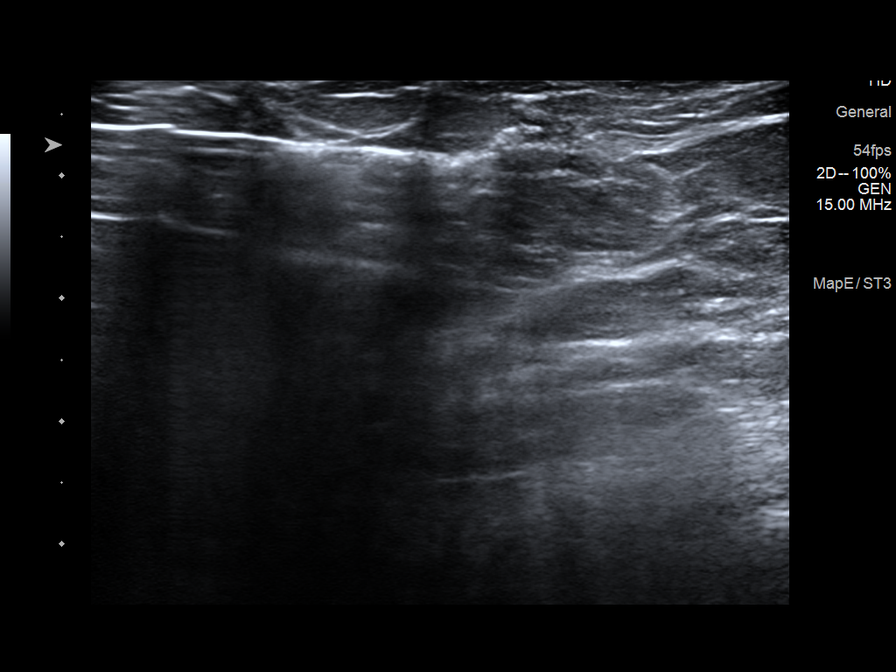

[8 of 8 positions shown; findings below may reference images not displayed]



Lesion quadrant: 12 o'clock, 1 cm from the nipple

Using sterile technique and 1% Lidocaine as local anesthetic, under
direct ultrasound visualization, a 12 gauge Lesner Maelo device was
used to perform biopsy of a right breast mass using a lateral
approach. At the conclusion of the procedure a ribbon shaped tissue
marker clip was deployed into the biopsy cavity. Follow up 2 view
mammogram was performed and dictated separately.
IMPRESSION: Ultrasound guided biopsy of a right breast mass. No apparent
complications.

ADDENDUM:
Pathology revealed FEATURES CONSISTENT WITH INTRADUCTAL PAPILLOMA of
the RIGHT breast, 12:00 o'clock. The biopsy material most likely
represents the edge of an intraductal papilloma. There is no
evidence of atypia on the biopsy. This was found to be concordant by
Dr. Amacker Nickel, with surgical consultation for consideration of
excision recommended.

Pathology results were discussed with the patient by telephone. The
patient reported doing well after the biopsy with tenderness at the
site. Post biopsy instructions and care were reviewed and questions
were answered. The patient was encouraged to call The [REDACTED] for any additional concerns. My direct phone
number was provided.

Surgical consultation has been arranged with Dr. Nelianne Sam-Sin at
[REDACTED] on June 22, 2020.

Recommendation for a bilateral breast MRI for further evaluation of
LEFT nipple discharge.

Recommendation for Genetic Counseling due to strong family history
of breast cancer in 3 sisters.

Pathology results reported by Kashyap Dickerson, RN on 05/28/2020.



Lesion quadrant: 12 o'clock, 1 cm from the nipple

Using sterile technique and 1% Lidocaine as local anesthetic, under
direct ultrasound visualization, a 12 gauge Lesner Maelo device was
used to perform biopsy of a right breast mass using a lateral
approach. At the conclusion of the procedure a ribbon shaped tissue
marker clip was deployed into the biopsy cavity. Follow up 2 view
mammogram was performed and dictated separately.
IMPRESSION: Ultrasound guided biopsy of a right breast mass. No apparent
complications.

## 2022-04-01 NOTE — Progress Notes (Unsigned)
HPI: Amanda Cordova is a 61 y.o. female with past medical history of CAD, COPD, hypertension, hyperlipidemia, and peripheral neuropathy here today for follow up. She was last seen on 01/07/2022, when HCTZ 25 mg daily was added for hypertension. She is also on amlodipine 10 mg daily. BP readings at home:130/70. Side effects:None. Negative for unusual or severe headache, visual changes, exertional chest pain, dyspnea, focal weakness, or edema. Still having problems with local pharmacy, so would like to change her meds to mail order. Lab Results  Component Value Date   CREATININE 0.77 01/28/2022   BUN 11 01/28/2022   NA 142 01/28/2022   K 3.9 01/28/2022   CL 107 01/28/2022   CO2 28 01/28/2022   Vestibular disorder, history of acoustic neuroma, status post left-sided craniotomy in 2011.Marland Kitchen She is currently on alprazolam 0.25 mg daily as needed to help with dizziness.  Since her last visit she has seen ENT and urologist.  History of cervical myelopathy, states that symptoms resolved after spinal surgery.   Insomnia: She tried doxepin, which caused migraine headaches. She has also tried trazodone but had an allergic reaction to it. She reports sleeping for only two hours in the past couple of nights. Denies depressed mood.    04/04/2022    7:16 AM 01/07/2022   11:32 AM 12/10/2021    7:27 AM 06/07/2021   11:21 AM 04/21/2021    7:21 AM  Depression screen PHQ 2/9  Decreased Interest 0 0 0 0 0  Down, Depressed, Hopeless 0 0 0 0 0  PHQ - 2 Score 0 0 0 0 0  Altered sleeping '2 1 2  1  '$ Tired, decreased energy 1 1 0  1  Change in appetite 0 1 0  0  Feeling bad or failure about yourself  0 0 0  0  Trouble concentrating 0 0 0  0  Moving slowly or fidgety/restless 0 0 0  0  Suicidal thoughts 0 0 0  0  PHQ-9 Score '3 3 2  2  '$ Difficult doing work/chores Not difficult at all Not difficult at all Not difficult at all  Not difficult at all   Lab Results  Component Value Date   TSH  2.66 08/20/2020   COPD:She has been using Spiriva Respimat 2.5 mcg 2 puff daily and reports significant improvement. She is not using Albuterol as frequently as she did, it is now occasional.  Review of Systems  Constitutional:  Positive for fatigue. Negative for activity change, appetite change, chills and fever.  Respiratory:  Negative for cough and wheezing.   Gastrointestinal:  Negative for abdominal pain, nausea and vomiting.  Endocrine: Negative for cold intolerance and heat intolerance.  Genitourinary:  Negative for decreased urine volume, dysuria and hematuria.  Musculoskeletal:  Positive for gait problem.  Neurological:  Negative for syncope and facial asymmetry.  See other pertinent positives and negatives in HPI.  Current Outpatient Medications on File Prior to Visit  Medication Sig Dispense Refill   albuterol (VENTOLIN HFA) 108 (90 Base) MCG/ACT inhaler INHALE 2 PUFFS INTO THE LUNGS EVERY 6 HOURS AS NEEDED FOR WHEEZING OR SHORTNESS OF BREATH 18 g 2   ALPRAZolam (XANAX) 0.25 MG tablet TAKE 1 TABLET(0.25 MG) BY MOUTH DAILY AS NEEDED FOR ANXIETY 30 tablet 3   aspirin EC 81 MG tablet Take 1 tablet (81 mg total) by mouth daily. Swallow whole. 90 tablet 3   docusate sodium (COLACE) 100 MG capsule Take 1 capsule (100 mg total) by mouth  2 (two) times daily.     Evolocumab (REPATHA SURECLICK) 272 MG/ML SOAJ Inject 140 mg into the skin every 14 (fourteen) days. 2 mL 11   promethazine (PHENERGAN) 12.5 MG tablet Take 1 tablet (12.5 mg total) by mouth every 4 (four) hours as needed for nausea or vomiting. 15 tablet 0   Respiratory Therapy Supplies (CARETOUCH 2 CPAP HOSE HANGER) MISC      No current facility-administered medications on file prior to visit.   Past Medical History:  Diagnosis Date   Acoustic neuroma ([place])    CAD (coronary artery disease)    Chronic kidney disease    COPD (chronic obstructive pulmonary disease) (HCC)    Dyspnea    Family history of bladder cancer     Family history of breast cancer    Family history of thyroid cancer    Hyperlipemia    Hypertension    Neuropathy    Optic neuritis    Pre-diabetes    Sleep apnea    wears cpap   Allergies  Allergen Reactions   Gabapentin Anaphylaxis and Swelling    eyes swelled, throat, hands,   Tramadol Anaphylaxis and Swelling    Throat and hands   Crestor [Rosuvastatin] Other (See Comments)    Muscle aches    Duloxetine Nausea And Vomiting   Lipitor [Atorvastatin]     myalgias   Pravastatin     myalgias   Zetia [Ezetimibe]     myalgias   Lisinopril Nausea Only    Social History   Socioeconomic History   Marital status: Married    Spouse name: Not on file   Number of children: 4   Years of education: 12   Highest education level: 12th grade  Occupational History   Occupation: Disabled  Tobacco Use   Smoking status: Former    Packs/day: 1.00    Years: 35.00    Total pack years: 35.00    Types: Cigarettes    Quit date: 01/09/2018    Years since quitting: 4.2    Passive exposure: Current   Smokeless tobacco: Never  Vaping Use   Vaping Use: Never used  Substance and Sexual Activity   Alcohol use: Not Currently   Drug use: Never   Sexual activity: Yes  Other Topics Concern   Not on file  Social History Narrative   Lives at home with her husband and her great-grandson.   Right-handed.   2-3 cups caffeine per day.    Social Determinants of Health   Financial Resource Strain: Low Risk  (06/07/2021)   Overall Financial Resource Strain (CARDIA)    Difficulty of Paying Living Expenses: Not hard at all  Food Insecurity: Food Insecurity Present (06/07/2021)   Hunger Vital Sign    Worried About Running Out of Food in the Last Year: Sometimes true    Ran Out of Food in the Last Year: Sometimes true  Transportation Needs: No Transportation Needs (06/07/2021)   PRAPARE - Hydrologist (Medical): No    Lack of Transportation (Non-Medical): No  Physical  Activity: Sufficiently Active (06/07/2021)   Exercise Vital Sign    Days of Exercise per Week: 7 days    Minutes of Exercise per Session: 60 min  Stress: No Stress Concern Present (06/07/2021)   [place]    Feeling of Stress : Not at all  Social Connections: Unknown (04/20/2021)   Social Connection and Isolation Panel [NHANES]  Frequency of Communication with Friends and Family: More than three times a week    Frequency of Social Gatherings with Friends and Family: Twice a week    Attends Religious Services: Patient refused    Marine scientist or Organizations: No    Attends Music therapist: Not on file    Marital Status: Married   Vitals:   04/04/22 0710  BP: 130/80  Pulse: 86  Resp: 16  Temp: 98.8 F (37.1 C)  SpO2: 97%  Body mass index is 41.2 kg/m.  Physical Exam Vitals and nursing note reviewed.  Constitutional:      General: She is not in acute distress.    Appearance: She is well-developed.  HENT:     Head: Normocephalic and atraumatic.  Eyes:     Conjunctiva/sclera: Conjunctivae normal.  Cardiovascular:     Rate and Rhythm: Normal rate and regular rhythm.     Pulses:          Dorsalis pedis pulses are 2+ on the right side and 2+ on the left side.     Heart sounds: No murmur heard.    Comments: Trace pitting LE edema, bilateral. Pulmonary:     Effort: Pulmonary effort is normal. No respiratory distress.     Breath sounds: Normal breath sounds.  Abdominal:     Palpations: Abdomen is soft. There is no hepatomegaly or mass.     Tenderness: There is no abdominal tenderness.  Lymphadenopathy:     Cervical: No cervical adenopathy.  Skin:    General: Skin is warm.     Findings: No erythema or rash.  Neurological:     General: No focal deficit present.     Mental Status: She is alert and oriented to person, place, and time.     Cranial Nerves: No cranial nerve deficit.      Comments: Unstable, antalgic gait assisted with a cane.  Psychiatric:        Mood and Affect: Mood and affect normal.   ASSESSMENT AND PLAN:  Amanda Cordova was seen today for follow-up.  Diagnoses and all orders for this visit: Hypertension BP adequately controlled. Continue Amlodipine 10 mg daily, HCTZ 25 mg daily,and low salt diet. Continue monitoring BP regularly. Eye exam is current.  Emphysema of lung ([place]) Problem is well controlled. Continue Spiriva respimat 2.5 mcg 2 puff daily and Albuterol inh 1-2 puff qid prn. Rx sent to mail order.  Insomnia Trazodone did not help and she had side effects with Doxepin. Recommend Mirtazapine 15 mg starting with 1/2 tab daily at bedtime. Some side effects discussed. Good sleep hygiene is also recommended. She will let me know if medication helps.  Disorder of vestibular function Stable. Continue Alprazolam 0.25 mg daily as needed. Fall precautions to continue.  Return in about 5 months (around 09/03/2022) for chronic problems.  Lakia Gritton G. [name], MD  [facility]. [place] office.

## 2022-04-04 ENCOUNTER — Ambulatory Visit (INDEPENDENT_AMBULATORY_CARE_PROVIDER_SITE_OTHER): Payer: Medicare HMO | Admitting: Family Medicine

## 2022-04-04 ENCOUNTER — Encounter: Payer: Self-pay | Admitting: Family Medicine

## 2022-04-04 VITALS — BP 130/80 | HR 86 | Temp 98.8°F | Resp 16 | Ht 64.0 in | Wt 240.0 lb

## 2022-04-04 DIAGNOSIS — J432 Centrilobular emphysema: Secondary | ICD-10-CM

## 2022-04-04 DIAGNOSIS — G4700 Insomnia, unspecified: Secondary | ICD-10-CM

## 2022-04-04 DIAGNOSIS — H8190 Unspecified disorder of vestibular function, unspecified ear: Secondary | ICD-10-CM | POA: Diagnosis not present

## 2022-04-04 DIAGNOSIS — I10 Essential (primary) hypertension: Secondary | ICD-10-CM | POA: Diagnosis not present

## 2022-04-04 MED ORDER — MIRTAZAPINE 15 MG PO TBDP: 7.5000 mg | ORAL_TABLET | Freq: Every day | ORAL | 0 refills | Status: DC

## 2022-04-04 MED ORDER — MIRTAZAPINE 15 MG PO TABS: 7.5000 mg | ORAL_TABLET | Freq: Every day | ORAL | 1 refills | Status: DC

## 2022-04-04 MED ORDER — HYDROCHLOROTHIAZIDE 25 MG PO TABS: 25.0000 mg | ORAL_TABLET | Freq: Every day | ORAL | 1 refills | Status: DC

## 2022-04-04 MED ORDER — SPIRIVA RESPIMAT 2.5 MCG/ACT IN AERS: 2.0000 | INHALATION_SPRAY | Freq: Every day | RESPIRATORY_TRACT | 6 refills | Status: DC

## 2022-04-04 MED ORDER — AMLODIPINE BESYLATE 10 MG PO TABS: 10.0000 mg | ORAL_TABLET | Freq: Every day | ORAL | 3 refills | Status: DC

## 2022-04-04 NOTE — Assessment & Plan Note (Signed)
Stable. Continue Alprazolam 0.25 mg daily as needed. Fall precautions to continue.

## 2022-04-04 NOTE — Assessment & Plan Note (Signed)
Problem is well controlled. Continue Spiriva respimat 2.5 mcg 2 puff daily and Albuterol inh 1-2 puff qid prn. Rx sent to mail order.

## 2022-04-04 NOTE — Patient Instructions (Addendum)
A few things to remember from today's visit:  Primary hypertension - Plan: hydrochlorothiazide (HYDRODIURIL) 25 MG tablet  Disorder of vestibular function, unspecified laterality  Centrilobular emphysema ([place]) - Plan: Tiotropium Bromide Monohydrate (SPIRIVA RESPIMAT) 2.5 MCG/ACT AERS  Insomnia, unspecified type - Plan: mirtazapine (REMERON SOL-TAB) 15 MG disintegrating tablet  For insomnia you can try Mirtazapine 15 mg 1/2 tab 30 min before bedtime. Continue good sleep hygiene. No changes in rest of meds.  If you need refills for medications you take chronically, please call your pharmacy. Do not use My Chart to request refills or for acute issues that need immediate attention. If you send a my chart message, it may take a few days to be addressed, specially if I am not in the office.  Please be sure medication list is accurate. If a new problem present, please set up appointment sooner than planned today.

## 2022-04-04 NOTE — Addendum Note (Signed)
Addended by: [name], Naidelyn Parrella G on: 04/04/2022 03:13 PM   Modules accepted: Orders

## 2022-04-04 NOTE — Assessment & Plan Note (Signed)
Trazodone did not help and she had side effects with Doxepin. Recommend Mirtazapine 15 mg starting with 1/2 tab daily at bedtime. Some side effects discussed. Good sleep hygiene is also recommended. She will let me know if medication helps.

## 2022-04-04 NOTE — Assessment & Plan Note (Signed)
BP adequately controlled. Continue Amlodipine 10 mg daily, HCTZ 25 mg daily,and low salt diet. Continue monitoring BP regularly. Eye exam is current.

## 2022-04-18 ENCOUNTER — Other Ambulatory Visit: Payer: Self-pay | Admitting: Adult Health

## 2022-06-08 ENCOUNTER — Other Ambulatory Visit: Payer: Self-pay | Admitting: Family Medicine

## 2022-06-08 ENCOUNTER — Telehealth: Payer: Self-pay | Admitting: Family Medicine

## 2022-06-08 DIAGNOSIS — G4700 Insomnia, unspecified: Secondary | ICD-10-CM

## 2022-06-08 NOTE — Telephone Encounter (Signed)
Contacted Amanda Cordova to schedule their annual wellness visit. Appointment made for 06/16/22.  [facility] [employer] AWV direct phone # [phone]  Patient called needing to r/s her 06/13/22 awv to 06/16/22

## 2022-06-10 ENCOUNTER — Other Ambulatory Visit: Payer: Self-pay | Admitting: Family Medicine

## 2022-06-10 DIAGNOSIS — H8190 Unspecified disorder of vestibular function, unspecified ear: Secondary | ICD-10-CM

## 2022-06-13 ENCOUNTER — Ambulatory Visit: Payer: Medicare HMO

## 2022-06-16 ENCOUNTER — Ambulatory Visit (INDEPENDENT_AMBULATORY_CARE_PROVIDER_SITE_OTHER): Payer: Medicare HMO | Admitting: Family Medicine

## 2022-06-16 DIAGNOSIS — Z0000 Encounter for general adult medical examination without abnormal findings: Secondary | ICD-10-CM

## 2022-06-16 NOTE — Progress Notes (Signed)
PATIENT CHECK-IN and HEALTH RISK ASSESSMENT QUESTIONNAIRE:  -completed by phone/video for upcoming Medicare Preventive Visit  Pre-Visit Check-in: 1)Vitals (height, wt, BP, etc) - record in vitals section for visit on day of visit 2)Review and Update Medications, Allergies PMH, Surgeries, Social history in Epic 3)Hospitalizations in the last year with date/reason?  Yes- due to kidney sx only stayed over night 4)Review and Update Care Team (patient's specialists) in Epic 5) Complete PHQ9 in Epic  6) Complete Fall Screening in [place] all Health Maintenance Due and order under PCP if not done.  8)Medicare Wellness Questionnaire: Answer theses question about your habits: Do you drink alcohol? No    If yes, how many drinks do you have a day?No Have you ever smoked?Yes    Quit date if applicable?  Quit in 2019 How many packs a day do/did you smoke? 1 to 2 packs per day Do you use smokeless tobacco?No Do you use an illicit drugs?No  Do you exercises? Yes  IF so, what type and how many days/minutes per week?walk 1 hour per day with dog - uses cane Are you sexually active? Patient said she would does not want to answer  Number of partners? 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  Feeding yourself?  Getting from bed to chair?  Getting to  the toilet?  Bathing or showering?  Dressing yourself?  Managing money?  Climbing a flight of stairs  Preparing meals?  Do you have Advanced Directives in place (Living Will, Healthcare Power or Attorney)? Did do this, does not have a copy our system   Last eye Exam and location?Aug 2023   Do you currently use prescribed or non-prescribed narcotic or opioid pain medications?No, use Tylenol  Do you have a history or close family history of breast, ovarian, tubal or peritoneal cancer or a family member with BRCA (breast cancer susceptibility 1 and 2) gene mutations?  Nurse/Assistant Credentials/time stamp:  St   ----------------------------------------------------------------------------------------------------------------------------------------------------------------------------------------------------------------------   MEDICARE ANNUAL PREVENTIVE VISIT WITH PROVIDER: (Welcome to [employer], initial annual wellness or annual wellness exam)  Virtual Visit via Video Note  I connected with Krisie Cyphers Silva on 06/16/22 by a video enabled telemedicine application and verified that I am speaking with the correct person using two identifiers.  Location patient: home Location provider:work or home office Persons participating in the virtual visit: patient, provider  Concerns and/or follow up today: reports all is stable, no concerns.  BP 124/75, stays in the 120s/70s now she reports and she is happy that the swelling in her legs has resolved.    See HM section in Epic for other details of completed HM.    ROS: negative for report of fevers, unintentional weight loss, vision changes, vision loss, hearing loss or change, chest pain, sob, hemoptysis, melena, hematochezia, hematuria, genital discharge or lesions, falls, bleeding or bruising, loc,  thoughts of suicide or self harm, memory loss  Patient-completed extensive health risk assessment - reviewed and discussed with the  patient: See Health Risk Assessment completed with patient prior to the visit either above or in recent phone note. This was reviewed in detailed with the patient today and appropriate recommendations, orders and referrals were placed as needed per Summary below and patient instructions.   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[facility])    CAD (coronary artery disease)    Chronic kidney disease    COPD (chronic obstructive pulmonary disease) ([place])    Dyspnea    Family history of bladder cancer    Family history of breast cancer    Family history of thyroid cancer    Hyperlipemia    Hypertension    Neuropathy    Optic neuritis    Pre-diabetes    Sleep apnea    wears cpap    Past Surgical History:  Procedure Laterality Date   ABDOMINAL HYSTERECTOMY     ANTERIOR CERVICAL DECOMP/DISCECTOMY FUSION N/A 05/28/2020   Procedure: ANTERIOR CERVICAL DECOMPRESSION FUSION CERVICAL THREE-FOUR.;  Surgeon: Karsten Ro, DO;  Location: [place];  Service: Neurosurgery;  Laterality: N/A;  anterior   BREAST EXCISIONAL BIOPSY Right 2000   benign   cerical fusion  1995   CHOLECYSTECTOMY     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   HYSTERECTOMY ABDOMINAL WITH SALPINGECTOMY     MENISCUS REPAIR Left    ROBOTIC ASSITED PARTIAL NEPHRECTOMY Left 09/21/2021   Procedure: XI ROBOTIC ASSITED PARTIAL NEPHRECTOMY;  Surgeon: Ceasar Mons, MD;  Location: [facility];  Service: Urology;  Laterality: Left;    Social History   Socioeconomic History   Marital status: Married    Spouse name: Not on file    Number of children: 4   Years of education: 12   Highest education level: 12th grade  Occupational History   Occupation: Disabled  Tobacco Use   Smoking status: Former    Packs/day: 1.00    Years: 35.00    Total pack years: 35.00    Types: Cigarettes    Quit date: 01/09/2018    Years since quitting: 4.4    Passive exposure: Current   Smokeless tobacco: Never  Vaping Use   Vaping Use: Never used  Substance and Sexual Activity   Alcohol use: Not Currently   Drug use: Never   Sexual activity: Yes  Other Topics Concern   Not on file  Social History Narrative   Lives at home with her husband and her great-grandson.   Right-handed.   2-3 cups caffeine per day.    Social Determinants of Health   Financial Resource Strain: Low Risk  (06/07/2021)   Overall Financial Resource Strain (CARDIA)    Difficulty of Paying Living Expenses: Not hard at all  Food Insecurity: Food Insecurity Present (06/07/2021)   Hunger Vital Sign    Worried About Running Out of Food in the Last Year: Sometimes true    Ran Out of Food in the Last Year: Sometimes true  Transportation Needs: No Transportation Needs (06/07/2021)   PRAPARE -  Hydrologist (Medical): No    Lack of Transportation (Non-Medical): No  Physical Activity: Sufficiently Active (06/07/2021)   Exercise Vital Sign    Days of Exercise per Week: 7 days    Minutes of Exercise per Session: 60 min  Stress: No Stress Concern Present (06/07/2021)   [place]    Feeling of Stress : Not at all  Social Connections: Unknown (04/20/2021)   Social Connection and Isolation Panel [NHANES]    Frequency of Communication with Friends and Family: More than three times a week    Frequency of Social Gatherings with Friends and Family: Twice a week    Attends Religious Services: Patient refused    Active Member of Clubs or Organizations: No    Attends Programme researcher, broadcasting/film/video: Not on file    Marital Status: Married  Human resources officer Violence: Not At Risk (07/01/2020)   Humiliation, Afraid, Rape, and Kick questionnaire    Fear of Current or Ex-Partner: No    Emotionally Abused: No    Physically Abused: No    Sexually Abused: No    Family History  Problem Relation Age of Onset   COPD Mother    Heart failure Mother    Heart attack Father    Breast cancer Sister    Diabetes Sister 15       negative genetic test (VUS in CHEK2 c.[id number] possibly mosaic)   Breast cancer Sister 31   Breast cancer Sister        dx 32s   Diabetes Maternal Aunt    Kidney cancer Maternal Uncle        dx >50   Bladder Cancer Other        dx 21s (mother's first cousin)   Thyroid cancer Other        dx 6s (mother's first cousin)   Breast cancer Other     Current Outpatient Medications on File Prior to Visit  Medication Sig Dispense Refill   albuterol (VENTOLIN HFA) 108 (90 Base) MCG/ACT inhaler INHALE 2 PUFFS INTO THE LUNGS EVERY 6 HOURS AS NEEDED FOR WHEEZING OR SHORTNESS OF BREATH 18 g 2   ALPRAZolam (XANAX) 0.25 MG tablet TAKE 1 TABLET(0.25 MG) BY MOUTH DAILY AS NEEDED FOR ANXIETY 30 tablet 3   amLODipine (NORVASC) 10 MG tablet Take 1 tablet (10 mg total) by mouth daily. 90 tablet 3   aspirin EC 81 MG tablet Take 1 tablet (81 mg total) by mouth daily. Swallow whole. 90 tablet 3   docusate sodium (COLACE) 100 MG capsule Take 1 capsule (100 mg total) by mouth 2 (two) times daily.     hydrochlorothiazide (HYDRODIURIL) 25 MG tablet Take 1 tablet (25 mg total) by mouth daily. 90 tablet 1   mirtazapine (REMERON) 15 MG tablet TAKE 1/2 TABLET AT BEDTIME 30 tablet 5   promethazine (PHENERGAN) 12.5 MG tablet Take 1 tablet (12.5 mg total) by mouth every 4 (four) hours as needed for nausea or vomiting. 15 tablet 0   REPATHA SURECLICK [id number] MG/ML SOAJ ADMINISTER 1 ML UNDER THE SKIN EVERY 14 DAYS 2 mL 11   Respiratory Therapy Supplies (CARETOUCH 2 CPAP HOSE HANGER)  MISC      Tiotropium Bromide Monohydrate (SPIRIVA RESPIMAT) 2.5 MCG/ACT AERS Inhale 2 puffs into the lungs daily. 4 g 6   No current facility-administered medications on file prior to visit.    Allergies  Allergen Reactions   Gabapentin Anaphylaxis  and Swelling    eyes swelled, throat, hands,   Tramadol Anaphylaxis and Swelling    Throat and hands   Crestor [Rosuvastatin] Other (See Comments)    Muscle aches    Duloxetine Nausea And Vomiting   Lipitor [Atorvastatin]     myalgias   Pravastatin     myalgias   Zetia [Ezetimibe]     myalgias   Lisinopril Nausea Only       Physical Exam There were no vitals filed for this visit. Estimated body mass index is 41.2 kg/m as calculated from the following:   Height as of 04/04/22: '5\' 4"'$  (1.626 m).   Weight as of 04/04/22: 240 lb (108.9 kg).  EKG (optional): deferred due to virtual visit  GENERAL: alert, oriented, no acute distress detected, full vision exam deferred due to pandemic and/or virtual encounter   HEENT: atraumatic, conjunttiva clear, no obvious abnormalities on inspection of external nose and ears  NECK: normal movements of the head and neck  LUNGS: on inspection no signs of respiratory distress, breathing rate appears normal, no obvious gross SOB, gasping or wheezing  CV: no obvious cyanosis  MS: moves all visible extremities without noticeable abnormality  PSYCH/NEURO: pleasant and cooperative, no obvious depression or anxiety, speech and thought processing grossly intact, Cognitive function grossly intact  [place] Office Visit from 06/16/2022 in [place] [facility] [place]  PHQ-9 Total Score 3           06/16/2022   12:39 PM 06/16/2022   12:38 PM 04/04/2022    7:16 AM 01/07/2022   11:32 AM 12/10/2021    7:27 AM  Depression screen PHQ 2/9  Decreased Interest 0 0 0 0 0  Down, Depressed, Hopeless 0 0 0 0 0  PHQ - 2 Score 0 0 0 0 0  Altered sleeping 0  '2 1 2  '$ Tired, decreased  energy '3  1 1 '$ 0  Change in appetite 0  0 1 0  Feeling bad or failure about yourself  0  0 0 0  Trouble concentrating 0  0 0 0  Moving slowly or fidgety/restless 0  0 0 0  Suicidal thoughts 0  0 0 0  PHQ-9 Score '3  3 3 2  '$ Difficult doing work/chores Not difficult at all  Not difficult at all Not difficult at all Not difficult at all       09/22/2021   10:21 AM 12/10/2021    7:26 AM 01/07/2022   11:33 AM 04/04/2022    7:16 AM 06/16/2022   12:37 PM  [place] in the past year?  '1 1 1 1  '$ Number of falls in past year - Comments     per patient has a brain tumor, balance is unsteady at times.  Was there an injury with Fall?  0 0 0 0  Fall Risk Category Calculator  '1 1 1 2  '$ Fall Risk Category (Retired)  Low Low Low   (RETIRED) Patient Fall Risk Level Moderate fall risk Moderate fall risk  Moderate fall risk   Patient at Risk for Falls Due to  History of fall(s)  History of fall(s)   Fall risk Follow up  Falls evaluation completed  Falls evaluation completed      SUMMARY AND PLAN:  Encounter for Medicare annual wellness exam   Discussed applicable health maintenance/preventive health measures and advised and referred or ordered per patient preferences:  Health Maintenance  Topic Date Due   Hepatitis C  Screening  Never done, discussed, can request with labs if wishes to do   COVID-19 Vaccine (1) 07/02/2023 (Originally 01/06/1966) - patient declined   Zoster Vaccines- Shingrix (1 of 2) 09/14/2023 (Originally 01/05/1978) - patient declined   Lung Cancer Screening  02/25/2023 - reports is scheduled   MAMMOGRAM  03/17/2023 - reports is scheduled   Medicare Annual Wellness (AWV)  06/16/2023   COLONOSCOPY (Pts 45-91yr Insurance coverage will need to be confirmed)  04/18/2026   HIV Screening  Completed   HPV VACCINES  Aged Out   DTaP/Tdap/Td  Discontinued   INFLUENZA VACCINE  Discontinued   PAP SMEAR-Modifier  Discontinued by prior provided - per chart s/p hysterectomy     Education and counseling on the following was provided based on the above review of health and a plan/checklist for the patient, along with additional information discussed, was provided for the patient in the patient instructions :  -requested she provide a copy of her advanced directives to her PCP office -Discussed healthy whole foods plant predominant diet, low sodium < 1500 mg for HTN and hx edema, lots of berries and greens such a kale, 5-6 servings of veggies per day - she seems interested in nutrition and had a good discussion. Reports she tries to avoid sodium when cooking at home. -congratulated on regular walking! Encouraged to continued, suggested including some balance and strength training and discussed options. Provided further info in patient instructions.  -Advise yearly dental visits at minimum and regular eye exams   Follow up: see patient instructions     Patient Instructions  I really enjoyed getting to talk with you today! I am available on Tuesdays and Thursdays for virtual visits if you have any questions or concerns, or if I can be of any further assistance.   CHECKLIST FROM ANNUAL WELLNESS VISIT:  -Follow up (please call to schedule if not scheduled after visit):  -yearly for annual wellness visit with primary care office  Here is a list of your preventive care/health maintenance measures and the plan for each if any are due:  Health Maintenance  Topic Date Due   Hepatitis C Screening  Never done   COVID-19 Vaccine (1) 07/02/2023 (Originally 01/06/1966)   Zoster Vaccines- Shingrix (1 of 2) 09/14/2023 (Originally 01/05/1978)   Lung Cancer Screening  02/25/2023   MAMMOGRAM  03/17/2023   Medicare Annual Wellness (AWV)  06/16/2023   COLONOSCOPY (Pts 45-489yrInsurance coverage will need to be confirmed)  04/18/2026   HIV Screening  Completed   HPV VACCINES  Aged Out   DTaP/Tdap/Td  Discontinued   INFLUENZA VACCINE  Discontinued   PAP SMEAR-Modifier   Discontinued    -See a dentist at least yearly  -Get your eyes checked and then per your eye specialist's recommendations  -Other issues addressed today:   -I have included below further information regarding a healthy whole foods based diet, physical activity guidelines for adults, stress management and opportunities for social connections. I hope you find this information useful.   -----------------------------------------------------------------------------------------------------------------------------------------------------------------------------------------------------------------------------------------------------------  NUTRITION: -eat real food: lots of colorful vegetables (half the plate) and fruits -5-7 servings of vegetables and fruits per day (fresh or steamed is best), exp. 2 servings of vegetables with lunch and dinner and 2 servings of fruit per day. Berries and greens such as kale and collards are great choices.  -consume on a regular basis: whole grains (make sure first ingredient on label contains the word "whole"), fresh fruits, fish, nuts, seeds, healthy oils (such as olive  oil, avocado oil, grape seed oil) -may eat small amounts of dairy and lean meat on occasion, but avoid processed meats such as ham, bacon, lunch meat, etc. -drink water -try to avoid fast food and pre-packaged foods, processed meat -most experts advise limiting sodium to < '2300mg'$  per day, should limit further is any chronic conditions such as high blood pressure, heart disease, diabetes, etc. The American Heart Association advised that < '1500mg'$  is is ideal -try to avoid foods that contain any ingredients with names you do not recognize  -try to avoid sugar/sweets (except for the natural sugar that occurs in fresh fruit) -try to avoid sweet drinks -try to avoid white rice, white bread, pasta (unless whole grain), white or yellow potatoes  EXERCISE GUIDELINES FOR ADULTS: -if you wish to increase  your physical activity, do so gradually and with the approval of your doctor -STOP and seek medical care immediately if you have any chest pain, chest discomfort or trouble breathing when starting or increasing exercise  -move and stretch your body, legs, feet and arms when sitting for long periods -Physical activity guidelines for optimal health in adults: -least 150 minutes per week of aerobic exercise (can talk, but not sing) once approved by your doctor, 20-30 minutes of sustained activity or two 10 minute episodes of sustained activity every day.  -resistance training at least 2 days per week if approved by your doctor -balance exercises 3+ days per week:   Stand somewhere where you have something sturdy to hold onto if you lose balance.    1) lift up on toes, start with 5x per day and work up to 20x   2) stand and lift on leg straight out to the side so that foot is a few inches of the floor, start with 5x each side and work up to 20x each side   3) stand on one foot, start with 5 seconds each side and work up to 20 seconds on each side  If you need ideas or help with getting more active:  -Silver sneakers https://tools.silversneakers.com  -Walk with a Doc: http://stephens-thompson.biz/  -try to include resistance (weight lifting/strength building) and balance exercises twice per week: or the following link for ideas: ChessContest.fr  UpdateClothing.com.cy  STRESS MANAGEMENT: -can try meditating, or just sitting quietly with deep breathing while intentionally relaxing all parts of your body for 5 minutes daily -if you need further help with stress, anxiety or depression please follow up with your primary doctor or contact the wonderful folks at [place]: [place]: -options in [place] if you wish to engage in more social and exercise related  activities:  -Silver sneakers https://tools.silversneakers.com  -Walk with a Doc: http://stephens-thompson.biz/  -Check out the [place] 50+ section on the [place] [employer] (hiking clubs, book clubs, cards and games, chess, exercise classes, aquatic classes and much more) - see the website for details: https://www.[place].gov/departments/parks-recreation/active-adults50  -YouTube has lots of exercise videos for different ages and abilities as well  -[place] (a variety of indoor and outdoor inperson activities for adults). [phone]. [address]  -Virtual Online Classes (a variety of topics): see seniorplanet.org or call ([phone]  -consider volunteering at a school, hospice center, church, senior center or elsewhere           Lucretia Kern, DO

## 2022-06-16 NOTE — Patient Instructions (Addendum)
I really enjoyed getting to talk with you today! I am available on Tuesdays and Thursdays for virtual visits if you have any questions or concerns, or if I can be of any further assistance.   CHECKLIST FROM ANNUAL WELLNESS VISIT:  -Follow up (please call to schedule if not scheduled after visit):  -yearly for annual wellness visit with primary care office  Here is a list of your preventive care/health maintenance measures and the plan for each if any are due:  Health Maintenance  Topic Date Due   Hepatitis C Screening  Never done   COVID-19 Vaccine (1) 07/02/2023 (Originally 01/06/1966)   Zoster Vaccines- Shingrix (1 of 2) 09/14/2023 (Originally 01/05/1980)   Lung Cancer Screening  02/25/2023   MAMMOGRAM  03/17/2023   Medicare Annual Wellness (AWV)  06/16/2023   COLONOSCOPY (Pts 45-60yr Insurance coverage will need to be confirmed)  04/18/2026   HIV Screening  Completed   HPV VACCINES  Aged Out   DTaP/Tdap/Td  Discontinued   INFLUENZA VACCINE  Discontinued   PAP SMEAR-Modifier  Discontinued    -See a dentist at least yearly  -Get your eyes checked and then per your eye specialist's recommendations  -Other issues addressed today:   -I have included below further information regarding a healthy whole foods based diet, physical activity guidelines for adults, stress management and opportunities for social connections. I hope you find this information useful.   -----------------------------------------------------------------------------------------------------------------------------------------------------------------------------------------------------------------------------------------------------------  NUTRITION: -eat real food: lots of colorful vegetables (half the plate) and fruits -5-7 servings of vegetables and fruits per day (fresh or steamed is best), exp. 2 servings of vegetables with lunch and dinner and 2 servings of fruit per day. Berries and greens such as kale and  collards are great choices.  -consume on a regular basis: whole grains (make sure first ingredient on label contains the word "whole"), fresh fruits, fish, nuts, seeds, healthy oils (such as olive oil, avocado oil, grape seed oil) -may eat small amounts of dairy and lean meat on occasion, but avoid processed meats such as ham, bacon, lunch meat, etc. -drink water -try to avoid fast food and pre-packaged foods, processed meat -most experts advise limiting sodium to < '2300mg'$  per day, should limit further is any chronic conditions such as high blood pressure, heart disease, diabetes, etc. The American Heart Association advised that < '1500mg'$  is is ideal -try to avoid foods that contain any ingredients with names you do not recognize  -try to avoid sugar/sweets (except for the natural sugar that occurs in fresh fruit) -try to avoid sweet drinks -try to avoid white rice, white bread, pasta (unless whole grain), white or yellow potatoes  EXERCISE GUIDELINES FOR ADULTS: -if you wish to increase your physical activity, do so gradually and with the approval of your doctor -STOP and seek medical care immediately if you have any chest pain, chest discomfort or trouble breathing when starting or increasing exercise  -move and stretch your body, legs, feet and arms when sitting for long periods -Physical activity guidelines for optimal health in adults: -least 150 minutes per week of aerobic exercise (can talk, but not sing) once approved by your doctor, 20-30 minutes of sustained activity or two 10 minute episodes of sustained activity every day.  -resistance training at least 2 days per week if approved by your doctor -balance exercises 3+ days per week:   Stand somewhere where you have something sturdy to hold onto if you lose balance.    1) lift up on toes, start  with 5x per day and work up to 20x   2) stand and lift on leg straight out to the side so that foot is a few inches of the floor, start with 5x  each side and work up to 20x each side   3) stand on one foot, start with 5 seconds each side and work up to 20 seconds on each side  If you need ideas or help with getting more active:  -Silver sneakers https://tools.silversneakers.com  -Walk with a Doc: http://stephens-thompson.biz/  -try to include resistance (weight lifting/strength building) and balance exercises twice per week: or the following link for ideas: ChessContest.fr  UpdateClothing.com.cy  STRESS MANAGEMENT: -can try meditating, or just sitting quietly with deep breathing while intentionally relaxing all parts of your body for 5 minutes daily -if you need further help with stress, anxiety or depression please follow up with your primary doctor or contact the wonderful folks at [address]: [place]: -options in [place] if you wish to engage in more social and exercise related activities:  -Silver sneakers https://tools.silversneakers.com  -Walk with a Doc: http://stephens-thompson.biz/  -Check out the [place] 50+ section on the [place] [employer] (hiking clubs, book clubs, cards and games, chess, exercise classes, aquatic classes and much more) - see the website for details: https://www.[place].gov/departments/parks-recreation/active-adults50  -YouTube has lots of exercise videos for different ages and abilities as well  -[place] (a variety of indoor and outdoor inperson activities for adults). [phone]. [address].  -Virtual Online Classes (a variety of topics): see seniorplanet.org or call [phone]  -consider volunteering at a school, hospice center, church, senior center or elsewhere

## 2022-06-22 DIAGNOSIS — G4733 Obstructive sleep apnea (adult) (pediatric): Secondary | ICD-10-CM | POA: Diagnosis not present

## 2022-06-22 DIAGNOSIS — I10 Essential (primary) hypertension: Secondary | ICD-10-CM | POA: Diagnosis not present

## 2022-06-24 DIAGNOSIS — H43313 Vitreous membranes and strands, bilateral: Secondary | ICD-10-CM | POA: Diagnosis not present

## 2022-06-24 DIAGNOSIS — H1045 Other chronic allergic conjunctivitis: Secondary | ICD-10-CM | POA: Diagnosis not present

## 2022-06-24 DIAGNOSIS — H353132 Nonexudative age-related macular degeneration, bilateral, intermediate dry stage: Secondary | ICD-10-CM | POA: Diagnosis not present

## 2022-06-24 DIAGNOSIS — H35363 Drusen (degenerative) of macula, bilateral: Secondary | ICD-10-CM | POA: Diagnosis not present

## 2022-06-24 DIAGNOSIS — H2513 Age-related nuclear cataract, bilateral: Secondary | ICD-10-CM | POA: Diagnosis not present

## 2022-06-29 NOTE — Progress Notes (Signed)
ACUTE VISIT Chief Complaint  Patient presents with   Foot Swelling    X a couple weeks. Received Amanda Cordova Amlodipine from the pharmacy and it was a different manufacturer. Swelling continued even after getting the original manufacturer back.    HPI: Amanda Cordova is a 62 y.o. female with PMHx significant for hypertension, hyperlipidemia, peripheral neuropathy, chronic pain, CAD, and COPD here today complaining of bilateral foot swelling for the past couple of weeks as described above. Amanda Cordova denies prior history of LE edema except for right peri ankle edema, intermittent since ankle fracture a few years ago.  The swelling initially started in the right foot after receiving a generic version of amlodipine and has since progressed to the left foot and up the legs. Amanda Cordova reports that the swelling is worse at the end of the day. There was a red line across the pretibial right leg, but it has been improving. Amanda Cordova has not tried treatments for this problem.  Amanda Cordova denies any decrease in urine output, presence of foam in the urine, or gross hematuria.   Hypertension: Amanda Cordova is currently taking hydrochlorothiazide 25 mg daily and amlodipine 10 mg daily. Negative for unusual/frequent headache, dyspnea, orthopnea, PND, abdominal pain, or nausea. Lab Results  Component Value Date   CREATININE 0.77 01/28/2022   BUN 11 01/28/2022   NA 142 01/28/2022   K 3.9 01/28/2022   CL 107 01/28/2022   CO2 28 01/28/2022   Review of Systems  Constitutional:  Negative for activity change, appetite change and fever.  HENT:  Negative for mouth sores, nosebleeds and sore throat.   Eyes:  Negative for redness and visual disturbance.  Respiratory:  Negative for cough, wheezing and stridor.   Gastrointestinal:  Negative for vomiting.       Negative for changes in bowel habits.  Endocrine: Negative for cold intolerance and heat intolerance.  Musculoskeletal:  Positive for arthralgias and gait problem.  Neurological:   Negative for syncope, facial asymmetry and weakness.  See other pertinent positives and negatives in HPI.  Current Outpatient Medications on File Prior to Visit  Medication Sig Dispense Refill   albuterol (VENTOLIN HFA) 108 (90 Base) MCG/ACT inhaler INHALE 2 PUFFS INTO THE LUNGS EVERY 6 HOURS AS NEEDED FOR WHEEZING OR SHORTNESS OF BREATH 18 g 2   ALPRAZolam (XANAX) 0.25 MG tablet TAKE 1 TABLET(0.25 MG) BY MOUTH DAILY AS NEEDED FOR ANXIETY 30 tablet 3   aspirin EC 81 MG tablet Take 1 tablet (81 mg total) by mouth daily. Swallow whole. 90 tablet 3   docusate sodium (COLACE) 100 MG capsule Take 1 capsule (100 mg total) by mouth 2 (two) times daily.     hydrochlorothiazide (HYDRODIURIL) 25 MG tablet Take 1 tablet (25 mg total) by mouth daily. 90 tablet 1   mirtazapine (REMERON) 15 MG tablet TAKE 1/2 TABLET AT BEDTIME 30 tablet 5   promethazine (PHENERGAN) 12.5 MG tablet Take 1 tablet (12.5 mg total) by mouth every 4 (four) hours as needed for nausea or vomiting. 15 tablet 0   REPATHA SURECLICK [id number] MG/ML SOAJ ADMINISTER 1 ML UNDER THE SKIN EVERY 14 DAYS 2 mL 11   Respiratory Therapy Supplies (CARETOUCH 2 CPAP HOSE HANGER) MISC      Tiotropium Bromide Monohydrate (SPIRIVA RESPIMAT) 2.5 MCG/ACT AERS Inhale 2 puffs into the lungs daily. 4 g 6   No current facility-administered medications on file prior to visit.   Past Medical History:  Diagnosis Date   Acoustic neuroma ([place])  CAD (coronary artery disease)    Chronic kidney disease    COPD (chronic obstructive pulmonary disease) (HCC)    Dyspnea    Family history of bladder cancer    Family history of breast cancer    Family history of thyroid cancer    Hyperlipemia    Hypertension    Neuropathy    Optic neuritis    Pre-diabetes    Sleep apnea    wears cpap   Allergies  Allergen Reactions   Gabapentin Anaphylaxis and Swelling    eyes swelled, throat, hands,   Tramadol Anaphylaxis and Swelling    Throat and hands   Crestor  [Rosuvastatin] Other (See Comments)    Muscle aches    Duloxetine Nausea And Vomiting   Lipitor [Atorvastatin]     myalgias   Pravastatin     myalgias   Zetia [Ezetimibe]     myalgias   Lisinopril Nausea Only    Social History   Socioeconomic History   Marital status: Married    Spouse name: Not on file   Number of children: 4   Years of education: 12   Highest education level: GED or equivalent  Occupational History   Occupation: Disabled  Tobacco Use   Smoking status: Former    Packs/day: 1.00    Years: 35.00    Additional pack years: 0.00    Total pack years: 35.00    Types: Cigarettes    Quit date: 01/09/2018    Years since quitting: 4.4    Passive exposure: Current   Smokeless tobacco: Never  Vaping Use   Vaping Use: Never used  Substance and Sexual Activity   Alcohol use: Not Currently   Drug use: Never   Sexual activity: Yes  Other Topics Concern   Not on file  Social History Narrative   Lives at home with Amanda Cordova husband and Amanda Cordova great-grandson.   Right-handed.   2-3 cups caffeine per day.    Social Determinants of Health   Financial Resource Strain: Low Risk  (06/29/2022)   Overall Financial Resource Strain (CARDIA)    Difficulty of Paying Living Expenses: Not hard at all  Food Insecurity: No Food Insecurity (06/29/2022)   Hunger Vital Sign    Worried About Running Out of Food in the Last Year: Never true    Ran Out of Food in the Last Year: Never true  Transportation Needs: No Transportation Needs (06/29/2022)   PRAPARE - Hydrologist (Medical): No    Lack of Transportation (Non-Medical): No  Physical Activity: Sufficiently Active (06/29/2022)   Exercise Vital Sign    Days of Exercise per Week: 7 days    Minutes of Exercise per Session: 60 min  Stress: No Stress Concern Present (06/29/2022)   [place]    Feeling of Stress : Not at all  Social Connections:  Unknown (06/29/2022)   Social Connection and Isolation Panel [NHANES]    Frequency of Communication with Friends and Family: More than three times a week    Frequency of Social Gatherings with Friends and Family: Patient declined    Attends Religious Services: Patient declined    Marine scientist or Organizations: No    Attends Archivist Meetings: Not on file    Marital Status: Married   Vitals:   07/01/22 0708  BP: 130/84  Pulse: 75  Resp: 16  Temp: 97.9 F (36.6 C)  SpO2: 97%  Body mass index is 42.98 kg/m.  Physical Exam Vitals and nursing note reviewed.  Constitutional:      General: Amanda Cordova is not in acute distress.    Appearance: Amanda Cordova is well-developed.  HENT:     Head: Normocephalic and atraumatic.     Mouth/Throat:     Mouth: Mucous membranes are moist.  Eyes:     Conjunctiva/sclera: Conjunctivae normal.  Cardiovascular:     Rate and Rhythm: Normal rate and regular rhythm.     Pulses:          Dorsalis pedis pulses are 2+ on the right side and 2+ on the left side.     Heart sounds: No murmur heard. Pulmonary:     Effort: Pulmonary effort is normal. No respiratory distress.     Breath sounds: Normal breath sounds.  Abdominal:     Palpations: Abdomen is soft. There is no mass.     Tenderness: There is no abdominal tenderness.  Musculoskeletal:     Right lower leg: 1+ Pitting Edema present.     Left lower leg: 1+ Pitting Edema present.  Lymphadenopathy:     Cervical: No cervical adenopathy.  Skin:    General: Skin is warm.     Findings: No erythema or rash.  Neurological:     General: No focal deficit present.     Mental Status: Amanda Cordova is alert and oriented to person, place, and time.     Cranial Nerves: No cranial nerve deficit.     Gait: Gait normal.     Comments: Antalgic gait, assisted with a cane  Psychiatric:        Mood and Affect: Affect normal. Mood is anxious.   ASSESSMENT AND PLAN:  Amanda Cordova was seen today for foot  swelling.  Diagnoses and all orders for this visit: Lab Results  Component Value Date   CREATININE 0.70 07/01/2022   BUN 13 07/01/2022   NA 144 07/01/2022   K 4.0 07/01/2022   CL 109 07/01/2022   CO2 25 07/01/2022   Lab Results  Component Value Date   ALT 9 07/01/2022   AST 17 07/01/2022   ALKPHOS 89 07/01/2022   BILITOT 0.6 07/01/2022   Lab Results  Component Value Date   TSH 1.38 07/01/2022   Bilateral lower extremity edema Assessment & Plan: We discussed possible etiologies. History and examination today do not suggest a serious process. Most likely related with venous insufficiency. Amlodipine could aggravate problem, so we decreased the dose today. Furosemide 20 mg to take daily for 3 to 5 days then daily as needed.  We discussed some side effects. Lower extremity elevation will also help. Recommend compression stockings. Continue adequate skin care. Further recommendation will be given according to lab results.  Orders: -     Basic metabolic panel; Future -     Hepatic function panel; Future -     Urinalysis, Routine w reflex microscopic; Future -     TSH; Future -     Furosemide; Take 1 tablet (20 mg total) by mouth daily as needed.  Dispense: 30 tablet; Refill: 1  Primary hypertension Assessment & Plan: BP today otherwise adequately controlled. Because of lower extremity edema, we decided to decrease amlodipine dose from 10 mg to 5 mg. Continue HCTZ 25 mg daily. Amanda Cordova has been on lisinopril in the past, poorly tolerated, according to pt, it was a changed in one of its components. Losartan 50 mg added today. Low-salt diet. Continue monitoring BP at home.  Orders: -     Basic metabolic panel; Future -     Hepatic function panel; Future -     Losartan Potassium; Take 1 tablet (50 mg total) by mouth daily.  Dispense: 90 tablet; Refill: 0 -     amLODIPine Besylate; Take 0.5 tablets (5 mg total) by mouth daily.  Dispense: 90 tablet; Refill: 3 -     TSH;  Future   Return in about 2 months (around 08/31/2022) for chronic problems.  Parv Manthey G. [name], MD  [facility]. [place] office.

## 2022-07-01 ENCOUNTER — Ambulatory Visit (INDEPENDENT_AMBULATORY_CARE_PROVIDER_SITE_OTHER): Payer: Medicare HMO | Admitting: Family Medicine

## 2022-07-01 ENCOUNTER — Encounter: Payer: Self-pay | Admitting: Family Medicine

## 2022-07-01 VITALS — BP 130/84 | HR 75 | Temp 97.9°F | Resp 16 | Ht 64.0 in | Wt 250.4 lb

## 2022-07-01 DIAGNOSIS — I10 Essential (primary) hypertension: Secondary | ICD-10-CM

## 2022-07-01 DIAGNOSIS — R600 Localized edema: Secondary | ICD-10-CM | POA: Diagnosis not present

## 2022-07-01 DIAGNOSIS — J449 Chronic obstructive pulmonary disease, unspecified: Secondary | ICD-10-CM | POA: Diagnosis not present

## 2022-07-01 LAB — URINALYSIS, ROUTINE W REFLEX MICROSCOPIC
Bilirubin Urine: NEGATIVE
Hgb urine dipstick: NEGATIVE
Ketones, ur: NEGATIVE
Leukocytes,Ua: NEGATIVE
Nitrite: NEGATIVE
Specific Gravity, Urine: 1.025 (ref 1.000–1.030)
Total Protein, Urine: NEGATIVE
Urine Glucose: NEGATIVE
Urobilinogen, UA: 0.2 (ref 0.0–1.0)
pH: 6 (ref 5.0–8.0)

## 2022-07-01 LAB — BASIC METABOLIC PANEL
BUN: 13 mg/dL (ref 6–23)
CO2: 25 mEq/L (ref 19–32)
Calcium: 9.2 mg/dL (ref 8.4–10.5)
Chloride: 109 mEq/L (ref 96–112)
Creatinine, Ser: 0.7 mg/dL (ref 0.40–1.20)
GFR: 92 mL/min (ref >60.00)
Glucose, Bld: 112 mg/dL — ABNORMAL HIGH (ref 70–99)
Potassium: 4 mEq/L (ref 3.5–5.1)
Sodium: 144 mEq/L (ref 135–145)

## 2022-07-01 LAB — HEPATIC FUNCTION PANEL
ALT: 9 U/L (ref 0–35)
AST: 17 U/L (ref 0–37)
Albumin: 3.8 g/dL (ref 3.5–5.2)
Alkaline Phosphatase: 89 U/L (ref 39–117)
Bilirubin, Direct: 0.2 mg/dL (ref 0.0–0.3)
Total Bilirubin: 0.6 mg/dL (ref 0.2–1.2)
Total Protein: 7 g/dL (ref 6.0–8.3)

## 2022-07-01 LAB — TSH: TSH: 1.38 u[IU]/mL (ref 0.35–5.50)

## 2022-07-01 MED ORDER — FUROSEMIDE 20 MG PO TABS: 20.0000 mg | ORAL_TABLET | Freq: Every day | ORAL | 1 refills | Status: DC | PRN

## 2022-07-01 MED ORDER — LOSARTAN POTASSIUM 50 MG PO TABS: 50.0000 mg | ORAL_TABLET | Freq: Every day | ORAL | 0 refills | Status: DC

## 2022-07-01 MED ORDER — AMLODIPINE BESYLATE 10 MG PO TABS: 5.0000 mg | ORAL_TABLET | Freq: Every day | ORAL | 3 refills | Status: DC

## 2022-07-01 NOTE — Patient Instructions (Signed)
A few things to remember from today's visit:  Bilateral lower extremity edema - Plan: Basic metabolic panel, Hepatic function panel, Urinalysis, Routine w reflex microscopic  Primary hypertension - Plan: Basic metabolic panel, Hepatic function panel, losartan (COZAAR) 50 MG tablet, amLODipine (NORVASC) 10 MG tablet Decrease Amlodipine to 5 mg, 1/2 of the tab you have. Today Losartan 50 mg added. Furosemide 20 mg daily for 3-5 days then daily as needed. Compression stockings.  If you need refills for medications you take chronically, please call your pharmacy. Do not use My Chart to request refills or for acute issues that need immediate attention. If you send a my chart message, it may take a few days to be addressed, specially if I am not in the office.  Please be sure medication list is accurate. If a new problem present, please set up appointment sooner than planned today.

## 2022-07-01 NOTE — Assessment & Plan Note (Signed)
BP today otherwise adequately controlled. Because of lower extremity edema, we decided to decrease amlodipine dose from 10 mg to 5 mg. Continue HCTZ 25 mg daily. She has been on lisinopril in the past, poorly tolerated, according to pt, it was a changed in one of its components. Losartan 50 mg added today. Low-salt diet. Continue monitoring BP at home.

## 2022-07-01 NOTE — Assessment & Plan Note (Signed)
We discussed possible etiologies. History and examination today do not suggest a serious process. Most likely related with venous insufficiency. Amlodipine could aggravate problem, so we decreased the dose today. Furosemide 20 mg to take daily for 3 to 5 days then daily as needed.  We discussed some side effects. Lower extremity elevation will also help. Recommend compression stockings. Continue adequate skin care. Further recommendation will be given according to lab results.

## 2022-07-04 DIAGNOSIS — Z1231 Encounter for screening mammogram for malignant neoplasm of breast: Secondary | ICD-10-CM | POA: Diagnosis not present

## 2022-07-04 LAB — HM MAMMOGRAPHY

## 2022-07-15 DIAGNOSIS — H2513 Age-related nuclear cataract, bilateral: Secondary | ICD-10-CM | POA: Diagnosis not present

## 2022-07-15 DIAGNOSIS — H353132 Nonexudative age-related macular degeneration, bilateral, intermediate dry stage: Secondary | ICD-10-CM | POA: Diagnosis not present

## 2022-07-15 DIAGNOSIS — H5213 Myopia, bilateral: Secondary | ICD-10-CM | POA: Diagnosis not present

## 2022-07-15 DIAGNOSIS — H524 Presbyopia: Secondary | ICD-10-CM | POA: Diagnosis not present

## 2022-07-15 DIAGNOSIS — H16223 Keratoconjunctivitis sicca, not specified as Sjogren's, bilateral: Secondary | ICD-10-CM | POA: Diagnosis not present

## 2022-07-15 DIAGNOSIS — H43313 Vitreous membranes and strands, bilateral: Secondary | ICD-10-CM | POA: Diagnosis not present

## 2022-07-15 DIAGNOSIS — H52223 Regular astigmatism, bilateral: Secondary | ICD-10-CM | POA: Diagnosis not present

## 2022-08-23 ENCOUNTER — Other Ambulatory Visit: Payer: Self-pay | Admitting: Family Medicine

## 2022-08-23 DIAGNOSIS — R600 Localized edema: Secondary | ICD-10-CM

## 2022-08-29 ENCOUNTER — Other Ambulatory Visit: Payer: Self-pay | Admitting: Family Medicine

## 2022-08-29 DIAGNOSIS — J432 Centrilobular emphysema: Secondary | ICD-10-CM

## 2022-08-30 ENCOUNTER — Encounter: Payer: Self-pay | Admitting: Pharmacist

## 2022-08-30 DIAGNOSIS — G720 Drug-induced myopathy: Secondary | ICD-10-CM

## 2022-08-30 NOTE — Progress Notes (Signed)
[facility])                                            [facility] Quality Pharmacy Team                                        Statin Quality Measure Assessment    08/30/2022  Alisya [facility] May 20, 1960 [id number]  Per review of chart and payor information, patient has a diagnosis of cardiovascular disease but is not currently filling a statin prescription.  This places patient into the [facility] (Statin Use In Patients with Cardiovascular Disease) measure for CMS.    Patient has documented allergy to statin but no corresponding CPT codes that would exclude patient from [facility] measure.  Patient is on Repatha.  Patient has an upcoming appointment on 09/05/22.  If deemed therapeutically appropriate, a statin exclusion code could be associated with the upcoming  visit, which would remove the patient from the measure.      Component Value Date/Time   CHOL 101 07/29/2021 0811   TRIG 58 07/29/2021 0811   HDL 51 07/29/2021 0811   CHOLHDL 2.0 07/29/2021 0811   LDLCALC 37 07/29/2021 0811     Please consider ONE of the following recommendations:  Initiate high intensity statin Atorvastatin 40mg  once daily, #90, 3 refills   Rosuvastatin 20mg  once daily, #90, 3 refills    Initiate moderate intensity  statin with reduced frequency if prior  statin intolerance 1x weekly, #13, 3 refills   2x weekly, #26, 3 refills   3x weekly, #39, 3 refills    Code for past statin intolerance  (required annually)   Provider Requirements:  Must asociate code during an office visit or telehealth encounter   Drug Induced Myopathy G72.0   Myalgia M79.1   Myositis, unspecified M60.9   Myopathy, unspecified G72.9   Rhabdomyolysis M62.82     Plan; Route note to Provider prior to upcoming appointment.  Beecher Mcardle, PharmD, BCACP [facility] Clinical Pharmacist [phone]

## 2022-09-02 NOTE — Progress Notes (Deleted)
HPI: Amanda Cordova is a 62 y.o. female, who is here today for chronic disease management.  Last seen on 07/01/2022.  *** Review of Systems See other pertinent positives and negatives in HPI.  Current Outpatient Medications on File Prior to Visit  Medication Sig Dispense Refill  . albuterol (VENTOLIN HFA) 108 (90 Base) MCG/ACT inhaler INHALE 2 PUFFS INTO THE LUNGS EVERY 6 HOURS AS NEEDED FOR WHEEZING OR SHORTNESS OF BREATH 18 g 2  . ALPRAZolam (XANAX) 0.25 MG tablet TAKE 1 TABLET(0.25 MG) BY MOUTH DAILY AS NEEDED FOR ANXIETY 30 tablet 3  . amLODipine (NORVASC) 10 MG tablet Take 0.5 tablets (5 mg total) by mouth daily. 90 tablet 3  . aspirin EC 81 MG tablet Take 1 tablet (81 mg total) by mouth daily. Swallow whole. 90 tablet 3  . docusate sodium (COLACE) 100 MG capsule Take 1 capsule (100 mg total) by mouth 2 (two) times daily.    . furosemide (LASIX) 20 MG tablet TAKE 1 TABLET(20 MG) BY MOUTH DAILY AS NEEDED 30 tablet 1  . hydrochlorothiazide (HYDRODIURIL) 25 MG tablet Take 1 tablet (25 mg total) by mouth daily. 90 tablet 1  . losartan (COZAAR) 50 MG tablet Take 1 tablet (50 mg total) by mouth daily. 90 tablet 0  . mirtazapine (REMERON) 15 MG tablet TAKE 1/2 TABLET AT BEDTIME 30 tablet 5  . promethazine (PHENERGAN) 12.5 MG tablet Take 1 tablet (12.5 mg total) by mouth every 4 (four) hours as needed for nausea or vomiting. 15 tablet 0  . REPATHA SURECLICK 140 MG/ML SOAJ ADMINISTER 1 ML UNDER THE SKIN EVERY 14 DAYS 2 mL 11  . Respiratory Therapy Supplies (CARETOUCH 2 CPAP HOSE HANGER) MISC     . SPIRIVA RESPIMAT 2.5 MCG/ACT AERS INHALE 2 PUFFS INTO THE LUNGS DAILY. 12 g 3   No current facility-administered medications on file prior to visit.    Past Medical History:  Diagnosis Date  . Acoustic neuroma (HCC)   . CAD (coronary artery disease)   . Chronic kidney disease   . COPD (chronic obstructive pulmonary disease) (HCC)   . Dyspnea   . Family history of bladder  cancer   . Family history of breast cancer   . Family history of thyroid cancer   . Hyperlipemia   . Hypertension   . Neuropathy   . Optic neuritis   . Pre-diabetes   . Sleep apnea    wears cpap   Allergies  Allergen Reactions  . Gabapentin Anaphylaxis and Swelling    eyes swelled, throat, hands,  . Tramadol Anaphylaxis and Swelling    Throat and hands  . Crestor [Rosuvastatin] Other (See Comments)    Muscle aches   . Duloxetine Nausea And Vomiting  . Lipitor [Atorvastatin]     myalgias  . Pravastatin     myalgias  . Zetia [Ezetimibe]     myalgias  . Lisinopril Nausea Only    Social History   Socioeconomic History  . Marital status: Married    Spouse name: Not on file  . Number of children: 4  . Years of education: 67  . Highest education level: GED or equivalent  Occupational History  . Occupation: Disabled  Tobacco Use  . Smoking status: Former    Packs/day: 1.00    Years: 35.00    Additional pack years: 0.00    Total pack years: 35.00    Types: Cigarettes    Quit date: 01/09/2018    Years since  quitting: 4.6    Passive exposure: Current  . Smokeless tobacco: Never  Vaping Use  . Vaping Use: Never used  Substance and Sexual Activity  . Alcohol use: Not Currently  . Drug use: Never  . Sexual activity: Yes  Other Topics Concern  . Not on file  Social History Narrative   Lives at home with her husband and her great-grandson.   Right-handed.   2-3 cups caffeine per day.    Social Determinants of Health   Financial Resource Strain: Low Risk  (06/29/2022)   Overall Financial Resource Strain (CARDIA)   . Difficulty of Paying Living Expenses: Not hard at all  Food Insecurity: No Food Insecurity (06/29/2022)   Hunger Vital Sign   . Worried About Programme researcher, broadcasting/film/video in the Last Year: Never true   . Ran Out of Food in the Last Year: Never true  Transportation Needs: No Transportation Needs (06/29/2022)   PRAPARE - Transportation   . Lack of Transportation  (Medical): No   . Lack of Transportation (Non-Medical): No  Physical Activity: Sufficiently Active (06/29/2022)   Exercise Vital Sign   . Days of Exercise per Week: 7 days   . Minutes of Exercise per Session: 60 min  Stress: No Stress Concern Present (06/29/2022)   [employer] of Occupational Health - Occupational Stress Questionnaire   . Feeling of Stress : Not at all  Social Connections: Unknown (06/29/2022)   Social Connection and Isolation Panel [NHANES]   . Frequency of Communication with Friends and Family: More than three times a week   . Frequency of Social Gatherings with Friends and Family: Patient declined   . Attends Religious Services: Patient declined   . Active Member of Clubs or Organizations: No   . Attends Banker Meetings: Not on file   . Marital Status: Married    There were no vitals filed for this visit. There is no height or weight on file to calculate BMI.  Physical Exam Vitals and nursing note reviewed.  Constitutional:      General: She is not in acute distress.    Appearance: She is well-developed.  HENT:     Head: Normocephalic and atraumatic.     Right Ear: Hearing, tympanic membrane, ear canal and external ear normal.     Left Ear: Hearing, tympanic membrane, ear canal and external ear normal.     Mouth/Throat:     Mouth: Mucous membranes are moist.     Pharynx: Oropharynx is clear. Uvula midline.  Eyes:     Extraocular Movements: Extraocular movements intact.     Conjunctiva/sclera: Conjunctivae normal.     Pupils: Pupils are equal, round, and reactive to light.  Neck:     Thyroid: No thyromegaly.     Trachea: No tracheal deviation.  Cardiovascular:     Rate and Rhythm: Normal rate and regular rhythm.     Pulses:          Dorsalis pedis pulses are 2+ on the right side and 2+ on the left side.       Posterior tibial pulses are 2+ on the right side and 2+ on the left side.     Heart sounds: No murmur heard. Pulmonary:      Effort: Pulmonary effort is normal. No respiratory distress.     Breath sounds: Normal breath sounds.  Abdominal:     Palpations: Abdomen is soft. There is no hepatomegaly or mass.     Tenderness: There is no  abdominal tenderness.  Genitourinary:    Comments: Deferred to gyn. Musculoskeletal:     Comments: No major deformity or signs of synovitis appreciated.  Lymphadenopathy:     Cervical: No cervical adenopathy.     Upper Body:     Right upper body: No supraclavicular adenopathy.     Left upper body: No supraclavicular adenopathy.  Skin:    General: Skin is warm.     Findings: No erythema or rash.  Neurological:     General: No focal deficit present.     Mental Status: She is alert and oriented to person, place, and time.     Cranial Nerves: No cranial nerve deficit.     Coordination: Coordination normal.     Gait: Gait normal.     Deep Tendon Reflexes:     Reflex Scores:      Bicep reflexes are 2+ on the right side and 2+ on the left side.      Patellar reflexes are 2+ on the right side and 2+ on the left side. Psychiatric:     Comments: Well groomed, good eye contact.   ASSESSMENT AND PLAN:  There are no diagnoses linked to this encounter.  No orders of the defined types were placed in this encounter.   No problem-specific Assessment & Plan notes found for this encounter.   No follow-ups on file.  [name]. [place], MD  [facility]. [place] office.

## 2022-09-05 ENCOUNTER — Ambulatory Visit: Payer: Medicare HMO | Admitting: Family Medicine

## 2022-09-05 DIAGNOSIS — G4700 Insomnia, unspecified: Secondary | ICD-10-CM

## 2022-09-05 DIAGNOSIS — I10 Essential (primary) hypertension: Secondary | ICD-10-CM

## 2022-09-07 ENCOUNTER — Encounter: Payer: Self-pay | Admitting: Family Medicine

## 2022-09-07 DIAGNOSIS — G729 Myopathy, unspecified: Secondary | ICD-10-CM | POA: Insufficient documentation

## 2022-09-13 NOTE — Progress Notes (Signed)
HPI: [name] is a 62 y.o. female, who is here today for chronic disease management.  Last seen on 07/01/22  She mentions discontinuing mirtazapine due to excessive tiredness, it was prescribed to help with sleep. She states that her sleep has improved, sleeping about 6-7 hours.   She expresses concerns about her difficulty in losing weight despite adhering to a diet that includes portion control, fruits, vegetables, and lean proteins, as well as maintaining an active lifestyle with walking and swimming.  COPD: She describes experiencing mild respiratory symptoms, including a slight cough, but indicates that it is not unusual for her and attributes it to seasonal allergies, which seems to be worse this year. Pruritic eyes and nose. Negative for associated fever,changes in appetite, or wheezing. She is on Spiriva respimat 2 puff daily and Albuterol inh 2 puff qid prn.  Hypertension:  She monitors her blood pressure at home and notes that it has been stable, with readings ranging from 120/78 to occasional 130/92. She confirms adherence to her amlodipine 5 mg daily, losartan 50 mg daily, and HCTZ 25 mg daily. She also takes furosemide 20 mg daily as needed for LE edema.  Negative for unusual or severe headache, visual changes, exertional chest pain, dyspnea,  focal weakness, or edema.  Lab Results  Component Value Date   CREATININE 0.70 07/01/2022   BUN 13 07/01/2022   NA 144 07/01/2022   K 4.0 07/01/2022   CL 109 07/01/2022   CO2 25 07/01/2022   Lab Results  Component Value Date   HGBA1C 5.5 09/10/2021   HLD: She reports that she is not currently taking statin medications due to previous experiences of muscle pain caused by this type of meds. She follows with cardiologist and currently on Repatha q 14 days.  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 She is on Alprazolam 0.25 mg daily as needed, which helps with dizziness.  Review of Systems  Constitutional:  Negative for activity change and appetite change.  HENT:  Negative for mouth sores and sore throat.   Gastrointestinal:  Negative for abdominal pain, nausea and vomiting.  Endocrine: Negative for cold intolerance, heat intolerance, polydipsia, polyphagia and polyuria.  Genitourinary:  Negative for decreased urine volume and hematuria.  Skin:  Negative for rash.  Allergic/Immunologic: Positive for environmental allergies.  Neurological:  Negative for syncope and facial asymmetry.  See other pertinent positives and negatives in HPI.  Current Outpatient Medications on File Prior to Visit  Medication Sig Dispense Refill   albuterol (VENTOLIN HFA) 108 (90 Base) MCG/ACT inhaler INHALE 2 PUFFS INTO THE LUNGS EVERY 6 HOURS AS NEEDED FOR WHEEZING OR SHORTNESS OF BREATH 18 g 2   ALPRAZolam (XANAX) 0.25 MG tablet TAKE 1 TABLET(0.25 MG) BY MOUTH DAILY AS NEEDED FOR ANXIETY 30 tablet 3   aspirin EC 81 MG tablet Take 1 tablet (81 mg total) by mouth daily. Swallow whole. 90 tablet 3   docusate sodium (COLACE) 100 MG capsule Take 1 capsule (100 mg total) by mouth 2 (two) times daily.     furosemide (LASIX) 20 MG tablet TAKE 1 TABLET(20 MG) BY MOUTH DAILY AS NEEDED 30 tablet 1   hydrochlorothiazide (HYDRODIURIL) 25 MG tablet Take 1 tablet (25 mg total) by mouth daily. 90 tablet 1  losartan (COZAAR) 50 MG tablet Take 1 tablet (50 mg total) by mouth daily. 90 tablet 0   promethazine (PHENERGAN) 12.5 MG tablet Take 1 tablet (12.5 mg total) by mouth every 4 (four) hours as needed for nausea or vomiting. 15 tablet 0   REPATHA SURECLICK 140 MG/ML SOAJ ADMINISTER 1 ML UNDER THE SKIN EVERY 14 DAYS 2 mL 11   Respiratory Therapy Supplies (CARETOUCH 2 CPAP HOSE HANGER) MISC      SPIRIVA RESPIMAT 2.5 MCG/ACT AERS  INHALE 2 PUFFS INTO THE LUNGS DAILY. 12 g 3   No current facility-administered medications on file prior to visit.   Past Medical History:  Diagnosis Date   Acoustic neuroma (HCC)    CAD (coronary artery disease)    Chronic kidney disease    COPD (chronic obstructive pulmonary disease) (HCC)    Dyspnea    Family history of bladder cancer    Family history of breast cancer    Family history of thyroid cancer    Hyperlipemia    Hypertension    Neuropathy    Optic neuritis    Pre-diabetes    Sleep apnea    wears cpap   Allergies  Allergen Reactions   Gabapentin Anaphylaxis and Swelling    eyes swelled, throat, hands,   Tramadol Anaphylaxis and Swelling    Throat and hands   Crestor [Rosuvastatin] Other (See Comments)    Muscle aches    Duloxetine Nausea And Vomiting   Lipitor [Atorvastatin]     myalgias   Pravastatin     myalgias   Zetia [Ezetimibe]     myalgias   Lisinopril Nausea Only    Social History   Socioeconomic History   Marital status: Married    Spouse name: Not on file   Number of children: 4   Years of education: 12   Highest education level: GED or equivalent  Occupational History   Occupation: Disabled  Tobacco Use   Smoking status: Former    Packs/day: 1.00    Years: 35.00    Additional pack years: 0.00    Total pack years: 35.00    Types: Cigarettes    Quit date: 01/09/2018    Years since quitting: 4.6    Passive exposure: Current   Smokeless tobacco: Never  Vaping Use   Vaping Use: Never used  Substance and Sexual Activity   Alcohol use: Not Currently   Drug use: Never   Sexual activity: Yes  Other Topics Concern   Not on file  Social History Narrative   Lives at home with her husband and her great-grandson.   Right-handed.   2-3 cups caffeine per day.    Social Determinants of Health   Financial Resource Strain: Low Risk  (06/29/2022)   Overall Financial Resource Strain (CARDIA)    Difficulty of Paying Living Expenses: Not  hard at all  Food Insecurity: No Food Insecurity (06/29/2022)   Hunger Vital Sign    Worried About Running Out of Food in the Last Year: Never true    Ran Out of Food in the Last Year: Never true  Transportation Needs: No Transportation Needs (06/29/2022)   PRAPARE - Administrator, Civil Service (Medical): No    Lack of Transportation (Non-Medical): No  Physical Activity: Sufficiently Active (06/29/2022)   Exercise Vital Sign    Days of Exercise per Week: 7 days    Minutes of Exercise per Session: 60 min  Stress: No Stress Concern Present (06/29/2022)  [employer] of Occupational Health - Occupational Stress Questionnaire    Feeling of Stress : Not at all  Social Connections: Unknown (06/29/2022)   Social Connection and Isolation Panel [NHANES]    Frequency of Communication with Friends and Family: More than three times a week    Frequency of Social Gatherings with Friends and Family: Patient declined    Attends Religious Services: Patient declined    Database administrator or Organizations: No    Attends Banker Meetings: Not on file    Marital Status: Married   Vitals:   09/14/22 0706  BP: 128/80  Pulse: 70  Resp: 16  Temp: 98 F (36.7 C)  SpO2: 95%   Wt Readings from Last 3 Encounters:  09/14/22 249 lb 6 oz (113.1 kg)  07/01/22 250 lb 6 oz (113.6 kg)  04/04/22 240 lb (108.9 kg)   Body mass index is 42.81 kg/m.  Physical Exam Vitals and nursing note reviewed.  Constitutional:      General: She is not in acute distress.    Appearance: She is well-developed.  HENT:     Head: Normocephalic and atraumatic.     Mouth/Throat:     Mouth: Mucous membranes are dry.     Pharynx: Oropharynx is clear.  Eyes:     Conjunctiva/sclera: Conjunctivae normal.  Cardiovascular:     Rate and Rhythm: Normal rate and regular rhythm.     Pulses:          Posterior tibial pulses are 2+ on the right side and 2+ on the left side.     Heart sounds: No murmur  heard. Pulmonary:     Effort: Pulmonary effort is normal. No respiratory distress.     Breath sounds: Normal breath sounds.  Abdominal:     Palpations: Abdomen is soft. There is no hepatomegaly or mass.     Tenderness: There is no abdominal tenderness.  Musculoskeletal:     Right lower leg: No edema.     Left lower leg: No edema.  Lymphadenopathy:     Cervical: No cervical adenopathy.  Skin:    General: Skin is warm.     Findings: No erythema or rash.  Neurological:     General: No focal deficit present.     Mental Status: She is alert and oriented to person, place, and time.     Cranial Nerves: No cranial nerve deficit.     Gait: Gait normal.     Comments: Mildly unstable gait, assicted with a cane.  Psychiatric:        Mood and Affect: Mood and affect normal.    ASSESSMENT AND PLAN:  MsKathleen was seen today for medical management of chronic issues.  Diagnoses and all orders for this visit:  Disorder of vestibular function, unspecified laterality Assessment & Plan: S/P craniotomy for acoustic neuroma. Problem has been stable. Continue Alprazolam 0.25 mg daily as needed. PMP reviewed. She has tolerated medication well. F/U in 6 months.   Obesity, morbid, BMI 40.0-49.9 (HCC) Assessment & Plan: She understands the benefits of wt loss as well as adverse effects of obesity. Consistency with healthy diet and physical activity as tolerated encouraged. Appt with [facility].   Orders: -     Amb Ref to Medical Weight Management  Primary hypertension Assessment & Plan: Problem is adequately controlled. Continue Amlodipine 10 mg 1/2 tab daily, HCTZ 25 mg daily,and Losartan 50 mg daily. Low-salt diet. Continue monitoring BP at home.  Orders: -     amLODIPine Besylate; Take 0.5 tablets (5 mg total) by mouth daily.  Dispense: 45 tablet; Refill: 2  Insomnia, unspecified type Assessment & Plan: Reports improvement, she is not longer on  Mirtazapine. Alprazolam 0.25 mg at bedtime has also helped. Continue good sleep hygiene.   Myopathy, unspecified Assessment & Plan: She has not tolerated different statins. LDL is at goal. Currently on Repatha.   Encounter for HCV screening test for low risk patient -     Hepatitis C antibody; Future  Centrilobular emphysema (HCC) Assessment & Plan: Problem is adequately controlled. Continue Spiriva respimat 2.5 mcg 2 puff daily and Albuterol inh 1-2 puff qid prn.   Atherosclerosis of aorta (HCC) Assessment & Plan: Has not tolerated statins in the past. Currently on Repatha 140 mg q 2 weeks and Aspirin 81 mg daily.   Statin myopathy [G72.0, T46.6X5A]   Return in about 6 months (around 03/17/2023) for chronic problems.  Eben Choinski G. [place], MD  [facility]. [place] office.

## 2022-09-14 ENCOUNTER — Ambulatory Visit (INDEPENDENT_AMBULATORY_CARE_PROVIDER_SITE_OTHER): Payer: Medicare HMO | Admitting: Family Medicine

## 2022-09-14 VITALS — BP 128/80 | HR 70 | Temp 98.0°F | Resp 16 | Ht 64.0 in | Wt 249.4 lb

## 2022-09-14 DIAGNOSIS — Z1159 Encounter for screening for other viral diseases: Secondary | ICD-10-CM | POA: Diagnosis not present

## 2022-09-14 DIAGNOSIS — I700 Atherosclerosis of aorta: Secondary | ICD-10-CM

## 2022-09-14 DIAGNOSIS — G729 Myopathy, unspecified: Secondary | ICD-10-CM

## 2022-09-14 DIAGNOSIS — I10 Essential (primary) hypertension: Secondary | ICD-10-CM | POA: Diagnosis not present

## 2022-09-14 DIAGNOSIS — G720 Drug-induced myopathy: Secondary | ICD-10-CM

## 2022-09-14 DIAGNOSIS — G4700 Insomnia, unspecified: Secondary | ICD-10-CM | POA: Diagnosis not present

## 2022-09-14 DIAGNOSIS — H8190 Unspecified disorder of vestibular function, unspecified ear: Secondary | ICD-10-CM | POA: Diagnosis not present

## 2022-09-14 DIAGNOSIS — R7303 Prediabetes: Secondary | ICD-10-CM

## 2022-09-14 DIAGNOSIS — J432 Centrilobular emphysema: Secondary | ICD-10-CM

## 2022-09-14 NOTE — Patient Instructions (Addendum)
A few things to remember from today's visit:  Primary hypertension  Insomnia, unspecified type  Prediabetes  Myopathy, unspecified  Encounter for HCV screening test for low risk patient  Disorder of vestibular function, unspecified laterality  Morbid obesity (HCC) - Plan: Amb Ref to Medical Weight Management  No changes today. Appt with healthy wt and wellness clinic will be arranged.  If you need refills for medications you take chronically, please call your pharmacy. Do not use My Chart to request refills or for acute issues that need immediate attention. If you send a my chart message, it may take a few days to be addressed, specially if I am not in the office.  Please be sure medication list is accurate. If a new problem present, please set up appointment sooner than planned today.

## 2022-09-16 ENCOUNTER — Encounter: Payer: Self-pay | Admitting: Family Medicine

## 2022-09-16 MED ORDER — AMLODIPINE BESYLATE 10 MG PO TABS
5.0000 mg | ORAL_TABLET | Freq: Every day | ORAL | 2 refills | Status: DC
Start: 2022-09-16 — End: 2023-03-20

## 2022-09-16 NOTE — Assessment & Plan Note (Signed)
Problem is adequately controlled. Continue Spiriva respimat 2.5 mcg 2 puff daily and Albuterol inh 1-2 puff qid prn.

## 2022-09-16 NOTE — Assessment & Plan Note (Signed)
She has not tolerated different statins. LDL is at goal. Currently on Repatha.

## 2022-09-16 NOTE — Assessment & Plan Note (Signed)
Reports improvement, she is not longer on Mirtazapine. Alprazolam 0.25 mg at bedtime has also helped. Continue good sleep hygiene.

## 2022-09-16 NOTE — Assessment & Plan Note (Addendum)
She understands the benefits of wt loss as well as adverse effects of obesity. Consistency with healthy diet and physical activity as tolerated encouraged. Appt with [facility].

## 2022-09-16 NOTE — Assessment & Plan Note (Signed)
Problem is adequately controlled. Continue Amlodipine 10 mg 1/2 tab daily, HCTZ 25 mg daily,and Losartan 50 mg daily. Low-salt diet. Continue monitoring BP at home.

## 2022-09-16 NOTE — Assessment & Plan Note (Addendum)
S/P craniotomy for acoustic neuroma. Problem has been stable. Continue Alprazolam 0.25 mg daily as needed. PMP reviewed. She has tolerated medication well. F/U in 6 months.

## 2022-09-16 NOTE — Assessment & Plan Note (Addendum)
Has not tolerated statins in the past. Currently on Repatha 140 mg q 2 weeks and Aspirin 81 mg daily.

## 2022-09-21 ENCOUNTER — Encounter (INDEPENDENT_AMBULATORY_CARE_PROVIDER_SITE_OTHER): Payer: Medicare HMO | Admitting: Family Medicine

## 2022-09-21 DIAGNOSIS — Z0289 Encounter for other administrative examinations: Secondary | ICD-10-CM

## 2022-09-25 ENCOUNTER — Other Ambulatory Visit: Payer: Self-pay | Admitting: Family Medicine

## 2022-09-25 DIAGNOSIS — I10 Essential (primary) hypertension: Secondary | ICD-10-CM

## 2022-10-14 ENCOUNTER — Other Ambulatory Visit: Payer: Self-pay | Admitting: Family Medicine

## 2022-10-14 DIAGNOSIS — H8190 Unspecified disorder of vestibular function, unspecified ear: Secondary | ICD-10-CM

## 2022-10-26 ENCOUNTER — Other Ambulatory Visit: Payer: Self-pay

## 2022-10-26 ENCOUNTER — Ambulatory Visit (INDEPENDENT_AMBULATORY_CARE_PROVIDER_SITE_OTHER): Payer: Medicare HMO | Admitting: Family Medicine

## 2022-10-26 DIAGNOSIS — R600 Localized edema: Secondary | ICD-10-CM

## 2022-10-26 DIAGNOSIS — I10 Essential (primary) hypertension: Secondary | ICD-10-CM

## 2022-10-26 MED ORDER — LOSARTAN POTASSIUM 50 MG PO TABS
ORAL_TABLET | ORAL | 2 refills | Status: DC
Start: 2022-10-26 — End: 2023-07-27

## 2022-10-26 MED ORDER — FUROSEMIDE 20 MG PO TABS
ORAL_TABLET | ORAL | 2 refills | Status: DC
Start: 2022-10-26 — End: 2022-12-06

## 2022-11-02 ENCOUNTER — Other Ambulatory Visit: Payer: Self-pay | Admitting: Family Medicine

## 2022-11-02 DIAGNOSIS — I10 Essential (primary) hypertension: Secondary | ICD-10-CM

## 2022-11-08 ENCOUNTER — Ambulatory Visit (INDEPENDENT_AMBULATORY_CARE_PROVIDER_SITE_OTHER): Payer: Medicare HMO | Admitting: Family Medicine

## 2022-11-08 ENCOUNTER — Encounter (INDEPENDENT_AMBULATORY_CARE_PROVIDER_SITE_OTHER): Payer: Self-pay | Admitting: Family Medicine

## 2022-11-08 VITALS — BP 115/75 | HR 60 | Temp 97.6°F | Ht 62.0 in | Wt 243.0 lb

## 2022-11-08 DIAGNOSIS — G4733 Obstructive sleep apnea (adult) (pediatric): Secondary | ICD-10-CM

## 2022-11-08 DIAGNOSIS — R0602 Shortness of breath: Secondary | ICD-10-CM

## 2022-11-08 DIAGNOSIS — E782 Mixed hyperlipidemia: Secondary | ICD-10-CM

## 2022-11-08 DIAGNOSIS — E559 Vitamin D deficiency, unspecified: Secondary | ICD-10-CM | POA: Diagnosis not present

## 2022-11-08 DIAGNOSIS — R739 Hyperglycemia, unspecified: Secondary | ICD-10-CM

## 2022-11-08 DIAGNOSIS — R5383 Other fatigue: Secondary | ICD-10-CM | POA: Diagnosis not present

## 2022-11-08 DIAGNOSIS — E66813 Obesity, class 3: Secondary | ICD-10-CM

## 2022-11-08 DIAGNOSIS — I10 Essential (primary) hypertension: Secondary | ICD-10-CM

## 2022-11-08 DIAGNOSIS — F32A Depression, unspecified: Secondary | ICD-10-CM

## 2022-11-08 DIAGNOSIS — Z6841 Body Mass Index (BMI) 40.0 and over, adult: Secondary | ICD-10-CM

## 2022-11-08 DIAGNOSIS — Z1331 Encounter for screening for depression: Secondary | ICD-10-CM

## 2022-11-08 NOTE — Progress Notes (Signed)
Chief Complaint:   OBESITY Amanda Cordova (MR# [id number]) is a 62 y.o. female who presents for evaluation and treatment of obesity and related comorbidities. Current BMI is Body mass index is 44.45 kg/m. Amanda Cordova has been struggling with her weight for many years and has been unsuccessful in either losing weight, maintaining weight loss, or reaching her healthy weight goal.  Amanda Cordova is currently in the action stage of change and ready to dedicate time achieving and maintaining a healthier weight. Amanda Cordova is interested in becoming our Amanda Cordova and working on intensive lifestyle modifications including (but not limited to) diet and exercise for weight loss.  New Amanda Cordova that was referred by Dr. [place]. She previously lost between 2-10lbs and can't break that cycle of loss and regain.  Significant family history of cancer- breast, thyroid, colon, and Amanda Cordova has history of kidney cancer.  Amanda Cordova is disabled and lives at home with husband [name].  He is supportive of her and sometimes they eat meals together.  Desired weight is 150lbs and last time she was that weight was when she was a preteen. Has previously tried Weight watchers, atkins, keto, PC, veg, fasting, low fat diets in the past. Eats out 2-3 times a week and eats at [employer] or [employer].  Sometimes will eat at [employer]. Only food dislikes is fish. Tends to gravitate to meat and potatoes. Likes a decent amount of vegetables. Skips meals daily depending on schedule.   Food Recall: Coffee with creamer in the am (1 tsp).  Will have toast or 1 cup cereal (Cheerios) and whole milk (drinks afterwards). Feels satisfied.  May eat later with a peanut butter and jelly sandwich and another cup of coffee.  May have chips on the side. Feels full from this meal.  Will eat again 5 hours later and may have pizza.  She will eat 6 slices (small slices) of cheese and sausage pizza.  Feels full from this meal. Sometimes she has coffee and  cake muffins after dinner.   Amanda Cordova were reviewed today and are as follows: Her family eats meals together, she thinks her family will eat healthier with her, her desired weight loss is 93 lbs, she has been heavy most of her life, she started gaining weight as she has fluctuated for years, her heaviest weight ever was 250 pounds, she is a picky eater and doesn't like to eat healthier foods, she has significant food cravings issues, she snacks frequently in the evenings, she skips meals frequently, she is frequently drinking liquids with calories, she frequently makes poor food choices, she has problems with excessive hunger, and she struggles with emotional eating.  Depression Screen [place]'s Food and Mood (modified PHQ-9) score was 9.  Subjective:   1. Other fatigue [name] admits to daytime somnolence and admits to waking up still tired. Amanda Cordova has a history of symptoms of daytime fatigue, morning fatigue, and morning headache. [name] generally gets  3-5 hours or 7-8  hours of sleep per night, and states that she has nightime awakenings. Snoring is present. Apneic episodes are present. Epworth Sleepiness Score is 10.   2. SOBOE (shortness of breath on exertion) [name] notes increasing shortness of breath with exercising and seems to be worsening over time with weight gain. She notes getting out of breath sooner with activity than she used to. This has not gotten worse recently. [name] denies shortness of breath at rest or orthopnea.  3. Hyperglycemia Amanda Cordova's last A1c was better controlled,  but her blood sugar is still elevated.  She is not on medications.  4. Primary hypertension Amanda Cordova's blood pressure is controlled today.  She is on amlodipine, hydrochlorothiazide, and losartan.  She sees Dr. Jens Som.  5. Mixed hyperlipidemia Amanda Cordova is on Repatha; intolerant of statins.  Last LDL was within normal limits 1 year ago.  6. OSA (obstructive sleep apnea) Amanda Cordova has  CPAP and wears it with 100% compliance.  She does not feel much different with CPAP.  She has a sleep doctor.  7. Vitamin D deficiency Amanda Cordova's previous vitamin D level was in the low 40s, and she notes fatigue.  Assessment/Plan:   1. Other fatigue Amanda Cordova does feel that her weight is causing her energy to be lower than it should be. Fatigue may be related to obesity, depression or many other causes. Labs will be ordered, and in the meanwhile, Amanda Cordova will focus on self care including making healthy food choices, increasing physical activity and focusing on stress reduction.  - EKG 12-Lead - TSH - T4, free - T3  2. SOBOE (shortness of breath on exertion) Amanda Cordova does feel that she gets out of breath more easily that she used to when she exercises. Amanda Cordova's shortness of breath appears to be obesity related and exercise induced. She has agreed to work on weight loss and gradually increase exercise to treat her exercise induced shortness of breath. Will continue to monitor closely.  3. Hyperglycemia We will check labs today, and we will follow-up at Amanda Cordova's next appointment.  - Hemoglobin A1c - Insulin, random  4. Primary hypertension We will check labs today, we will follow-up at her next appointment.  Amanda Cordova will continue her current medications with no change in dose.  - Comprehensive metabolic panel  5. Mixed hyperlipidemia We will check labs today, we will follow-up at Amanda Cordova's next appointment.  - Lipid Panel With LDL/HDL Ratio  6. OSA (obstructive sleep apnea) We will check labs today, and we will follow-up at her next visit.  Amanda Cordova will follow-up with her sleep doctor at her next yearly appointment.  Referral to [facility] for sleep apnea assessment and treatment (Amanda Cordova would like to transfer care).   - CBC with Differential/Platelet - Ambulatory referral to Neurology  7. Vitamin D deficiency We will check labs today, we will follow-up at Amanda Cordova's next  appointment.  - VITAMIN D 25 Hydroxy (Vit-D Deficiency, Fractures)  8. Depression screening [name] had a positive depression screening. Depression is commonly associated with obesity and often results in emotional eating behaviors. We will monitor this closely and work on CBT to help improve the non-hunger eating patterns. Referral to Psychology may be required if no improvement is seen as she continues in our clinic.  9. Class 3 severe obesity with serious comorbidity and body mass index (BMI) of 40.0 to 44.9 in adult, unspecified obesity type [facility]) Makenna is currently in the action stage of change and her goal is to continue with weight loss efforts. I recommend Macaylah begin the structured treatment plan as follows:  She has agreed to the Category 3 Plan.  Exercise goals: All adults should avoid inactivity. Some physical activity is better than none, and adults who participate in any amount of physical activity gain some health benefits.   Behavioral modification strategies: increasing lean protein intake, meal planning and cooking strategies, keeping healthy foods in the home, and planning for success.  She was informed of the importance of frequent follow-up visits to maximize her success with intensive lifestyle modifications for her  multiple health conditions. She was informed we would discuss her lab results at her next visit unless there is a critical issue that needs to be addressed sooner. Jehnna agreed to keep her next visit at the agreed upon time to discuss these results.  Objective:   Blood pressure 115/75, pulse 60, temperature 97.6 F (36.4 C), height 5\' 2"  (1.575 m), weight 243 lb (110.2 kg), SpO2 99%. Body mass index is 44.45 kg/m.  EKG: Normal sinus rhythm, rate 71 BPM.  Indirect Calorimeter completed today shows a VO2 of 277 and a REE of 1915.  Her calculated basal metabolic rate is 4696 thus her basal metabolic rate is better than expected.  General:  Cooperative, alert, well developed, in no acute distress. HEENT: Conjunctivae and lids unremarkable. Cardiovascular: Regular rhythm.  Lungs: Normal work of breathing. Neurologic: No focal deficits.   Lab Results  Component Value Date   CREATININE 0.77 11/08/2022   BUN 14 11/08/2022   NA 145 (H) 11/08/2022   K 4.0 11/08/2022   CL 104 11/08/2022   CO2 25 11/08/2022   Lab Results  Component Value Date   ALT 12 11/08/2022   AST 23 11/08/2022   ALKPHOS 100 11/08/2022   BILITOT 0.5 11/08/2022   Lab Results  Component Value Date   HGBA1C 5.9 (H) 11/08/2022   HGBA1C 5.5 09/10/2021   HGBA1C 5.6 12/23/2020   HGBA1C 5.8 (H) 03/05/2020   Lab Results  Component Value Date   INSULIN 54.2 (H) 11/08/2022   Lab Results  Component Value Date   TSH 1.490 11/08/2022   Lab Results  Component Value Date   CHOL 121 11/08/2022   HDL 41 11/08/2022   LDLCALC 60 11/08/2022   TRIG 105 11/08/2022   CHOLHDL 2.0 07/29/2021   Lab Results  Component Value Date   WBC 8.6 11/08/2022   HGB 15.4 11/08/2022   HCT 47.4 (H) 11/08/2022   MCV 92 11/08/2022   PLT 323 11/08/2022   No results found for: "IRON", "TIBC", "FERRITIN"  Attestation Statements:   Reviewed by clinician on day of visit: allergies, medications, problem list, medical history, surgical history, family history, social history, and previous encounter notes.   I, Burt Knack, am acting as transcriptionist for Reuben Likes, MD.  This is the Amanda Cordova's first visit at [facility]. The Amanda Cordova's NEW Amanda Cordova PACKET was reviewed at length. Included in the packet: current and past health history, medications, allergies, ROS, gynecologic history (women only), surgical history, family history, social history, weight history, weight loss surgery history (for those that have had weight loss surgery), nutritional evaluation, mood and food questionnaire, PHQ9, Epworth questionnaire, sleep Cordova questionnaire, Amanda Cordova  life and health improvement goals questionnaire. These will all be scanned into the Amanda Cordova's chart under media.   During the visit, I independently reviewed the Amanda Cordova's EKG, bioimpedance scale results, and indirect calorimeter results. I used this information to tailor a meal plan for the Amanda Cordova that will help her to lose weight and will improve her obesity-related conditions going forward. I performed a medically necessary appropriate examination and/or evaluation. I discussed the assessment and treatment plan with the Amanda Cordova. The Amanda Cordova was provided an opportunity to ask questions and all were answered. The Amanda Cordova agreed with the plan and demonstrated an understanding of the instructions. Labs were ordered at this visit and will be reviewed at the next visit unless more critical results need to be addressed immediately. Clinical information was updated and documented in the EMR.   I have  reviewed the above documentation for accuracy and completeness, and I agree with the above. - Reuben Likes, MD

## 2022-11-09 ENCOUNTER — Ambulatory Visit (INDEPENDENT_AMBULATORY_CARE_PROVIDER_SITE_OTHER): Payer: Medicare HMO | Admitting: Family Medicine

## 2022-11-09 LAB — COMPREHENSIVE METABOLIC PANEL
ALT: 12 IU/L (ref 0–32)
AST: 23 IU/L (ref 0–40)
Albumin: 4.5 g/dL (ref 3.9–4.9)
Alkaline Phosphatase: 100 IU/L (ref 44–121)
BUN/Creatinine Ratio: 18 (ref 12–28)
BUN: 14 mg/dL (ref 8–27)
Bilirubin Total: 0.5 mg/dL (ref 0.0–1.2)
CO2: 25 mmol/L (ref 20–29)
Calcium: 9.8 mg/dL (ref 8.7–10.3)
Chloride: 104 mmol/L (ref 96–106)
Creatinine, Ser: 0.77 mg/dL (ref 0.57–1.00)
Globulin, Total: 2.7 g/dL (ref 1.5–4.5)
Glucose: 109 mg/dL — ABNORMAL HIGH (ref 70–99)
Potassium: 4 mmol/L (ref 3.5–5.2)
Sodium: 145 mmol/L — ABNORMAL HIGH (ref 134–144)
Total Protein: 7.2 g/dL (ref 6.0–8.5)
eGFR: 87 mL/min/{1.73_m2} (ref >59)

## 2022-11-09 LAB — LIPID PANEL WITH LDL/HDL RATIO
Cholesterol, Total: 121 mg/dL (ref 100–199)
HDL: 41 mg/dL (ref >39)
LDL Chol Calc (NIH): 60 mg/dL (ref 0–99)
LDL/HDL Ratio: 1.5 ratio (ref 0.0–3.2)
Triglycerides: 105 mg/dL (ref 0–149)
VLDL Cholesterol Cal: 20 mg/dL (ref 5–40)

## 2022-11-09 LAB — T3: T3, Total: 193 ng/dL — ABNORMAL HIGH (ref 71–180)

## 2022-11-09 LAB — CBC WITH DIFFERENTIAL/PLATELET
Basophils Absolute: 0.1 10*3/uL (ref 0.0–0.2)
Basos: 1 %
EOS (ABSOLUTE): 0.2 10*3/uL (ref 0.0–0.4)
Eos: 2 %
Hematocrit: 47.4 % — ABNORMAL HIGH (ref 34.0–46.6)
Hemoglobin: 15.4 g/dL (ref 11.1–15.9)
Immature Grans (Abs): 0 10*3/uL (ref 0.0–0.1)
Immature Granulocytes: 0 %
Lymphocytes Absolute: 1.7 10*3/uL (ref 0.7–3.1)
Lymphs: 20 %
MCH: 29.8 pg (ref 26.6–33.0)
MCHC: 32.5 g/dL (ref 31.5–35.7)
MCV: 92 fL (ref 79–97)
Monocytes Absolute: 0.5 10*3/uL (ref 0.1–0.9)
Monocytes: 6 %
Neutrophils Absolute: 6.1 10*3/uL (ref 1.4–7.0)
Neutrophils: 71 %
Platelets: 323 10*3/uL (ref 150–450)
RBC: 5.16 x10E6/uL (ref 3.77–5.28)
RDW: 13.1 % (ref 11.7–15.4)
WBC: 8.6 10*3/uL (ref 3.4–10.8)

## 2022-11-09 LAB — INSULIN, RANDOM: INSULIN: 54.2 u[IU]/mL — ABNORMAL HIGH (ref 2.6–24.9)

## 2022-11-09 LAB — HEMOGLOBIN A1C
Est. average glucose Bld gHb Est-mCnc: 123 mg/dL
Hgb A1c MFr Bld: 5.9 % — ABNORMAL HIGH (ref 4.8–5.6)

## 2022-11-09 LAB — TSH: TSH: 1.49 u[IU]/mL (ref 0.450–4.500)

## 2022-11-09 LAB — VITAMIN D 25 HYDROXY (VIT D DEFICIENCY, FRACTURES): Vit D, 25-Hydroxy: 24.1 ng/mL — ABNORMAL LOW (ref 30.0–100.0)

## 2022-11-09 LAB — T4, FREE: Free T4: 1.27 ng/dL (ref 0.82–1.77)

## 2022-11-15 DIAGNOSIS — H52209 Unspecified astigmatism, unspecified eye: Secondary | ICD-10-CM | POA: Diagnosis not present

## 2022-11-15 DIAGNOSIS — H5213 Myopia, bilateral: Secondary | ICD-10-CM | POA: Diagnosis not present

## 2022-11-15 DIAGNOSIS — H524 Presbyopia: Secondary | ICD-10-CM | POA: Diagnosis not present

## 2022-11-23 ENCOUNTER — Encounter (INDEPENDENT_AMBULATORY_CARE_PROVIDER_SITE_OTHER): Payer: Self-pay | Admitting: Family Medicine

## 2022-11-23 ENCOUNTER — Ambulatory Visit (INDEPENDENT_AMBULATORY_CARE_PROVIDER_SITE_OTHER): Payer: Medicare HMO | Admitting: Family Medicine

## 2022-11-23 VITALS — BP 130/79 | HR 82 | Temp 98.5°F | Ht 62.0 in | Wt 241.0 lb

## 2022-11-23 DIAGNOSIS — E559 Vitamin D deficiency, unspecified: Secondary | ICD-10-CM

## 2022-11-23 DIAGNOSIS — Z6841 Body Mass Index (BMI) 40.0 and over, adult: Secondary | ICD-10-CM | POA: Diagnosis not present

## 2022-11-23 DIAGNOSIS — E669 Obesity, unspecified: Secondary | ICD-10-CM

## 2022-11-23 DIAGNOSIS — R7303 Prediabetes: Secondary | ICD-10-CM

## 2022-11-23 DIAGNOSIS — I10 Essential (primary) hypertension: Secondary | ICD-10-CM | POA: Diagnosis not present

## 2022-11-23 MED ORDER — VITAMIN D (ERGOCALCIFEROL) 1.25 MG (50000 UNIT) PO CAPS: 50000.0000 [IU] | ORAL_CAPSULE | ORAL | 0 refills | Status: DC

## 2022-11-23 NOTE — Progress Notes (Deleted)
HPI: FU CAD. Previously had chest CT for lung cancer screening. Noted to have aortic atherosclerosis and coronary artery calcification. COPD also noted.  Echocardiogram November 2019 showed normal LV function, moderate left ventricular hypertrophy, mild tricuspid regurgitation.  Nuclear study September 2022 showed ejection fraction 58% with no ischemia or infarction.  Abdominal CT March 2023 concerning for renal cell carcinoma on the left.  Since last seen,   Current Outpatient Medications  Medication Sig Dispense Refill   albuterol (VENTOLIN HFA) 108 (90 Base) MCG/ACT inhaler INHALE 2 PUFFS INTO THE LUNGS EVERY 6 HOURS AS NEEDED FOR WHEEZING OR SHORTNESS OF BREATH 18 g 2   ALPRAZolam (XANAX) 0.25 MG tablet TAKE 1 TABLET(0.25 MG) BY MOUTH DAILY AS NEEDED FOR ANXIETY 30 tablet 3   amLODipine (NORVASC) 10 MG tablet Take 0.5 tablets (5 mg total) by mouth daily. 45 tablet 2   aspirin EC 81 MG tablet Take 1 tablet (81 mg total) by mouth daily. Swallow whole. 90 tablet 3   furosemide (LASIX) 20 MG tablet TAKE 1 TABLET(20 MG) BY MOUTH DAILY AS NEEDED 90 tablet 2   hydrochlorothiazide (HYDRODIURIL) 25 MG tablet TAKE 1 TABLET (25 MG TOTAL) BY MOUTH DAILY. 90 tablet 3   losartan (COZAAR) 50 MG tablet TAKE 1 TABLET(50 MG) BY MOUTH DAILY 90 tablet 2   REPATHA SURECLICK 140 MG/ML SOAJ ADMINISTER 1 ML UNDER THE SKIN EVERY 14 DAYS 2 mL 11   Respiratory Therapy Supplies (CARETOUCH 2 CPAP HOSE HANGER) MISC      SPIRIVA RESPIMAT 2.5 MCG/ACT AERS INHALE 2 PUFFS INTO THE LUNGS DAILY. 12 g 3   No current facility-administered medications for this visit.     Past Medical History:  Diagnosis Date   Acoustic neuroma (HCC)    Back pain    CAD (coronary artery disease)    Cancer of kidney (HCC)    Chronic kidney disease    COPD (chronic obstructive pulmonary disease) (HCC)    Dyspnea    Emphysema/COPD (HCC)    Family history of bladder cancer    Family history of breast cancer    Family history of  thyroid cancer    Gallbladder problem    Hyperlipemia    Hypertension    Joint pain    Neuropathy    Optic neuritis    Pre-diabetes    Prediabetes    Rheumatoid arthritis (HCC)    Sleep apnea    wears cpap   SOB (shortness of breath)     Past Surgical History:  Procedure Laterality Date   ABDOMINAL HYSTERECTOMY     ACOUSTIC NEUROMA RESECTION  2011   ANTERIOR CERVICAL DECOMP/DISCECTOMY FUSION N/A 05/28/2020   Procedure: ANTERIOR CERVICAL DECOMPRESSION FUSION CERVICAL THREE-FOUR.;  Surgeon: Bethann Goo, DO;  Location: [facility] OR;  Service: Neurosurgery;  Laterality: N/A;  anterior   BREAST EXCISIONAL BIOPSY Right 2000   benign   cerical fusion  1995   CHOLECYSTECTOMY     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   HYSTERECTOMY ABDOMINAL WITH SALPINGECTOMY     MENISCUS REPAIR Left    ROBOTIC ASSITED PARTIAL NEPHRECTOMY Left 09/21/2021   Procedure: XI ROBOTIC ASSITED PARTIAL NEPHRECTOMY;  Surgeon: Rene Paci, MD;  Location: [facility];  Service: Urology;  Laterality: Left;    Social History   Socioeconomic History   Marital status: Married    Spouse name: Not on file   Number of children: 4   Years of education: 12   Highest education  level: GED or equivalent  Occupational History   Occupation: Disabled  Tobacco Use   Smoking status: Former    Current packs/day: 0.00    Average packs/day: 1 pack/day for 35.0 years (35.0 ttl pk-yrs)    Types: Cigarettes    Start date: 01/10/1983    Quit date: 01/09/2018    Years since quitting: 4.8    Passive exposure: Current   Smokeless tobacco: Never  Vaping Use   Vaping status: Never Used  Substance and Sexual Activity   Alcohol use: Not Currently   Drug use: Never   Sexual activity: Yes  Other Topics Concern   Not on file  Social History Narrative   Lives at home with her husband and her great-grandson.   Right-handed.   2-3 cups caffeine per day.    Social Determinants of Health   Financial Resource  Strain: Low Risk  (06/29/2022)   Overall Financial Resource Strain (CARDIA)    Difficulty of Paying Living Expenses: Not hard at all  Food Insecurity: No Food Insecurity (06/29/2022)   Hunger Vital Sign    Worried About Running Out of Food in the Last Year: Never true    Ran Out of Food in the Last Year: Never true  Transportation Needs: No Transportation Needs (06/29/2022)   PRAPARE - Administrator, Civil Service (Medical): No    Lack of Transportation (Non-Medical): No  Physical Activity: Sufficiently Active (06/29/2022)   Exercise Vital Sign    Days of Exercise per Week: 7 days    Minutes of Exercise per Session: 60 min  Stress: No Stress Concern Present (06/29/2022)   [employer] of Occupational Health - Occupational Stress Questionnaire    Feeling of Stress : Not at all  Social Connections: Unknown (06/29/2022)   Social Connection and Isolation Panel [NHANES]    Frequency of Communication with Friends and Family: More than three times a week    Frequency of Social Gatherings with Friends and Family: Patient declined    Attends Religious Services: Patient declined    Database administrator or Organizations: No    Attends Engineer, structural: Not on file    Marital Status: Married  Catering manager Violence: Not At Risk (07/01/2020)   Humiliation, Afraid, Rape, and Kick questionnaire    Fear of Current or Ex-Partner: No    Emotionally Abused: No    Physically Abused: No    Sexually Abused: No    Family History  Problem Relation Age of Onset   Hyperlipidemia Mother    Hypertension Mother    COPD Mother    Heart failure Mother    Heart disease Mother    Sleep apnea Mother    Heart disease Father    Hyperlipidemia Father    Hypertension Father    Heart attack Father    Sudden death Father    Breast cancer Sister    Diabetes Sister 40       negative genetic test (VUS in CHEK2 c.1420C>T possibly mosaic)   Breast cancer Sister 33   Breast cancer  Sister        dx 54s   Diabetes Maternal Aunt    Kidney cancer Maternal Uncle        dx >50   Bladder Cancer Other        dx 77s (mother's first cousin)   Thyroid cancer Other        dx 9s (mother's first cousin)   Breast cancer Other  ROS: no fevers or chills, productive cough, hemoptysis, dysphasia, odynophagia, melena, hematochezia, dysuria, hematuria, rash, seizure activity, orthopnea, PND, pedal edema, claudication. Remaining systems are negative.  Physical Exam: Well-developed well-nourished in no acute distress.  Skin is warm and dry.  HEENT is normal.  Neck is supple.  Chest is clear to auscultation with normal expansion.  Cardiovascular exam is regular rate and rhythm.  Abdominal exam nontender or distended. No masses palpated. Extremities show no edema. neuro grossly intact  ECG- personally reviewed  A/P  1 coronary artery disease/coronary calcification-patient denies chest pain.  Previous nuclear study showed no ischemia.  Continue aspirin.  Intolerant to statins.  2 hyperlipidemia-patient is intolerant to statins.  Continue Repatha.  3 hypertension-patient's blood pressure is controlled.  Continue present medications.  4 dyspnea-felt secondary to COPD.  5 abnormal CT-renal mass noted on previous abdominal CT.  I will ask her to follow-up with primary care for this issue.  Olga Millers, MD

## 2022-11-23 NOTE — Progress Notes (Signed)
Chief Complaint:   OBESITY Amanda Cordova is here to discuss her progress with her obesity treatment plan along with follow-up of her obesity related diagnoses. Amanda Cordova is on the Category 3 Plan and states she is following her eating plan approximately 100% of the time. Amanda Cordova states she is walking for 45 minutes 6-7 times per week.  Today's visit was #: 2 Starting weight: 243 lbs Starting date: 11/08/2022 Today's weight: 241 lbs Today's date: 11/23/2022 Total lbs lost to date: 2 Total lbs lost since last in-office visit: 2  Interim History: Patient arrives for first follow up.  She felt it was difficult to find some of the frozen foods.  She felt it was a significant amount of food for her.  She is drinking quite a bit more water.  Followed meal plan exactly. She was doing kellogg's 100 calorie pack, deli meat around mozzarella for snacks.  She did move some of the food around.  She is walking quite a bit (45 min 6-7 times a week).  She did find the meal plan very expensive.   Subjective:   1. Vitamin D deficiency Patient's recent vitamin D level was of 24.1, and she notes fatigue.  2. Prediabetes Patient's recent A1c was 5.9 and insulin 54.2.  She is not on medications.  Prediabetic A1c from 2 years ago.  3. Essential hypertension Patient's blood pressure is within normal limits today.  She is on amlodipine, Cozaar, and hydrochlorothiazide.  She denies chest pain, chest pressure, or headache.  Assessment/Plan:   1. Vitamin D deficiency Patient agreed to start prescription vitamin D 50,000 IU once weekly with no refills.  - Vitamin D, Ergocalciferol, (DRISDOL) 1.25 MG (50000 UNIT) CAPS capsule; Take 1 capsule (50,000 Units total) by mouth every 7 (seven) days.  Dispense: 4 capsule; Refill: 0  2. Prediabetes Pathophysiology of insulin resistance, prediabetes, and diabetes mellitus were discussed with the patient today.  No cravings or hunger noted.  No medications at this time.   We will repeat labs in 3 to 4 months.  3. Essential hypertension Patient will continue her current medications with no change in dose or medications.  4. BMI 40.0-44.9, adult (HCC)  5. Obesity with starting BMI of 44.4 Amanda Cordova is currently in the action stage of change. As such, her goal is to continue with weight loss efforts. She has agreed to the Category 3 Plan and keeping a food journal and adhering to recommended goals of 1400-1600 calories and 90+ grams of protein daily.   Exercise goals: All adults should avoid inactivity. Some physical activity is better than none, and adults who participate in any amount of physical activity gain some health benefits.  Behavioral modification strategies: increasing lean protein intake, meal planning and cooking strategies, keeping healthy foods in the home, and planning for success.  Amanda Cordova has agreed to follow-up with our clinic in 2 weeks. She was informed of the importance of frequent follow-up visits to maximize her success with intensive lifestyle modifications for her multiple health conditions.   Objective:   Blood pressure 130/79, pulse 82, temperature 98.5 F (36.9 C), height 5\' 2"  (1.575 m), weight 241 lb (109.3 kg), SpO2 96%. Body mass index is 44.08 kg/m.  General: Cooperative, alert, well developed, in no acute distress. HEENT: Conjunctivae and lids unremarkable. Cardiovascular: Regular rhythm.  Lungs: Normal work of breathing. Neurologic: No focal deficits.   Lab Results  Component Value Date   CREATININE 0.77 11/08/2022   BUN 14 11/08/2022   NA  145 (H) 11/08/2022   K 4.0 11/08/2022   CL 104 11/08/2022   CO2 25 11/08/2022   Lab Results  Component Value Date   ALT 12 11/08/2022   AST 23 11/08/2022   ALKPHOS 100 11/08/2022   BILITOT 0.5 11/08/2022   Lab Results  Component Value Date   HGBA1C 5.9 (H) 11/08/2022   HGBA1C 5.5 09/10/2021   HGBA1C 5.6 12/23/2020   HGBA1C 5.8 (H) 03/05/2020   Lab Results   Component Value Date   INSULIN 54.2 (H) 11/08/2022   Lab Results  Component Value Date   TSH 1.490 11/08/2022   Lab Results  Component Value Date   CHOL 121 11/08/2022   HDL 41 11/08/2022   LDLCALC 60 11/08/2022   TRIG 105 11/08/2022   CHOLHDL 2.0 07/29/2021   Lab Results  Component Value Date   VD25OH 24.1 (L) 11/08/2022   VD25OH 41.48 08/20/2020   VD25OH 47.7 03/05/2020   Lab Results  Component Value Date   WBC 8.6 11/08/2022   HGB 15.4 11/08/2022   HCT 47.4 (H) 11/08/2022   MCV 92 11/08/2022   PLT 323 11/08/2022   No results found for: "IRON", "TIBC", "FERRITIN"  Attestation Statements:   Reviewed by clinician on day of visit: allergies, medications, problem list, medical history, surgical history, family history, social history, and previous encounter notes.  Time spent on visit including pre-visit chart review and post-visit care and charting was 45 minutes.   I, Burt Knack, am acting as transcriptionist for Reuben Likes, MD.  I have reviewed the above documentation for accuracy and completeness, and I agree with the above. - Reuben Likes, MD

## 2022-11-26 ENCOUNTER — Encounter (INDEPENDENT_AMBULATORY_CARE_PROVIDER_SITE_OTHER): Payer: Self-pay | Admitting: Family Medicine

## 2022-11-28 NOTE — Telephone Encounter (Signed)
FYI

## 2022-11-28 NOTE — Progress Notes (Signed)
HPI: FU CAD. Previously had chest CT for lung cancer screening. Noted to have aortic atherosclerosis and coronary artery calcification. COPD also noted.  Echocardiogram November 2019 showed normal LV function, moderate left ventricular hypertrophy, mild tricuspid regurgitation.  Nuclear study September 2022 showed ejection fraction 58% with no ischemia or infarction.  Abdominal CT March 2023 concerning for renal cell carcinoma on the left.  Since last seen, she does have some dyspnea with more vigorous activities.  She developed lower extremity edema which improved with Lasix as needed.  She also feels occasional dyspnea with lying flat.  She has not had exertional chest pain or syncope.  Current Outpatient Medications  Medication Sig Dispense Refill   albuterol (VENTOLIN HFA) 108 (90 Base) MCG/ACT inhaler INHALE 2 PUFFS INTO THE LUNGS EVERY 6 HOURS AS NEEDED FOR WHEEZING OR SHORTNESS OF BREATH 18 g 2   ALPRAZolam (XANAX) 0.25 MG tablet TAKE 1 TABLET(0.25 MG) BY MOUTH DAILY AS NEEDED FOR ANXIETY 30 tablet 3   amLODipine (NORVASC) 10 MG tablet Take 0.5 tablets (5 mg total) by mouth daily. 45 tablet 2   aspirin EC 81 MG tablet Take 1 tablet (81 mg total) by mouth daily. Swallow whole. 90 tablet 3   furosemide (LASIX) 20 MG tablet TAKE 1 TABLET(20 MG) BY MOUTH DAILY AS NEEDED 90 tablet 2   hydrochlorothiazide (HYDRODIURIL) 25 MG tablet TAKE 1 TABLET (25 MG TOTAL) BY MOUTH DAILY. 90 tablet 3   losartan (COZAAR) 50 MG tablet TAKE 1 TABLET(50 MG) BY MOUTH DAILY 90 tablet 2   oxybutynin (DITROPAN-XL) 5 MG 24 hr tablet Take 5 mg by mouth daily.     REPATHA SURECLICK 140 MG/ML SOAJ ADMINISTER 1 ML UNDER THE SKIN EVERY 14 DAYS 2 mL 11   Respiratory Therapy Supplies (CARETOUCH 2 CPAP HOSE HANGER) MISC      SPIRIVA RESPIMAT 2.5 MCG/ACT AERS INHALE 2 PUFFS INTO THE LUNGS DAILY. 12 g 3   Vitamin D, Ergocalciferol, (DRISDOL) 1.25 MG (50000 UNIT) CAPS capsule Take 1 capsule (50,000 Units total) by mouth every  7 (seven) days. 4 capsule 0   No current facility-administered medications for this visit.     Past Medical History:  Diagnosis Date   Acoustic neuroma (HCC)    Back pain    CAD (coronary artery disease)    Cancer of kidney (HCC)    Chronic kidney disease    COPD (chronic obstructive pulmonary disease) (HCC)    Dyspnea    Emphysema/COPD (HCC)    Family history of bladder cancer    Family history of breast cancer    Family history of thyroid cancer    Gallbladder problem    Hyperlipemia    Hypertension    Joint pain    Neuropathy    Optic neuritis    Pre-diabetes    Prediabetes    Rheumatoid arthritis (HCC)    Sleep apnea    wears cpap   SOB (shortness of breath)     Past Surgical History:  Procedure Laterality Date   ABDOMINAL HYSTERECTOMY     ACOUSTIC NEUROMA RESECTION  2011   ANTERIOR CERVICAL DECOMP/DISCECTOMY FUSION N/A 05/28/2020   Procedure: ANTERIOR CERVICAL DECOMPRESSION FUSION CERVICAL THREE-FOUR.;  Surgeon: Bethann Goo, DO;  Location: [facility];  Service: Neurosurgery;  Laterality: N/A;  anterior   BREAST EXCISIONAL BIOPSY Right 2000   benign   cerical fusion  1995   CHOLECYSTECTOMY     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  HYSTERECTOMY ABDOMINAL WITH SALPINGECTOMY     MENISCUS REPAIR Left    ROBOTIC ASSITED PARTIAL NEPHRECTOMY Left 09/21/2021   Procedure: XI ROBOTIC ASSITED PARTIAL NEPHRECTOMY;  Surgeon: Rene Paci, MD;  Location: [facility];  Service: Urology;  Laterality: Left;    Social History   Socioeconomic History   Marital status: Married    Spouse name: Not on file   Number of children: 4   Years of education: 12   Highest education level: GED or equivalent  Occupational History   Occupation: Disabled  Tobacco Use   Smoking status: Former    Current packs/day: 0.00    Average packs/day: 1 pack/day for 35.0 years (35.0 ttl pk-yrs)    Types: Cigarettes    Start date: 01/10/1983    Quit date: 01/09/2018    Years  since quitting: 4.9    Passive exposure: Current   Smokeless tobacco: Never  Vaping Use   Vaping status: Never Used  Substance and Sexual Activity   Alcohol use: Not Currently   Drug use: Never   Sexual activity: Yes  Other Topics Concern   Not on file  Social History Narrative   Lives at home with her husband and her great-grandson.   Right-handed.   2-3 cups caffeine per day.    Social Determinants of Health   Financial Resource Strain: Low Risk  (06/29/2022)   Overall Financial Resource Strain (CARDIA)    Difficulty of Paying Living Expenses: Not hard at all  Food Insecurity: No Food Insecurity (06/29/2022)   Hunger Vital Sign    Worried About Running Out of Food in the Last Year: Never true    Ran Out of Food in the Last Year: Never true  Transportation Needs: No Transportation Needs (06/29/2022)   PRAPARE - Administrator, Civil Service (Medical): No    Lack of Transportation (Non-Medical): No  Physical Activity: Sufficiently Active (06/29/2022)   Exercise Vital Sign    Days of Exercise per Week: 7 days    Minutes of Exercise per Session: 60 min  Stress: No Stress Concern Present (06/29/2022)   [employer] of Occupational Health - Occupational Stress Questionnaire    Feeling of Stress : Not at all  Social Connections: Unknown (06/29/2022)   Social Connection and Isolation Panel [NHANES]    Frequency of Communication with Friends and Family: More than three times a week    Frequency of Social Gatherings with Friends and Family: Patient declined    Attends Religious Services: Patient declined    Database administrator or Organizations: No    Attends Engineer, structural: Not on file    Marital Status: Married  Catering manager Violence: Not At Risk (07/01/2020)   Humiliation, Afraid, Rape, and Kick questionnaire    Fear of Current or Ex-Partner: No    Emotionally Abused: No    Physically Abused: No    Sexually Abused: No    Family History   Problem Relation Age of Onset   Hyperlipidemia Mother    Hypertension Mother    COPD Mother    Heart failure Mother    Heart disease Mother    Sleep apnea Mother    Heart disease Father    Hyperlipidemia Father    Hypertension Father    Heart attack Father    Sudden death Father    Breast cancer Sister    Diabetes Sister 25       negative genetic test (VUS in CHEK2 [id number] possibly  mosaic)   Breast cancer Sister 51   Breast cancer Sister        dx 10s   Diabetes Maternal Aunt    Kidney cancer Maternal Uncle        dx >50   Bladder Cancer Other        dx 34s (mother's first cousin)   Thyroid cancer Other        dx 56s (mother's first cousin)   Breast cancer Other     ROS: no fevers or chills, productive cough, hemoptysis, dysphasia, odynophagia, melena, hematochezia, dysuria, hematuria, rash, seizure activity, orthopnea, PND, pedal edema, claudication. Remaining systems are negative.  Physical Exam: Well-developed well-nourished in no acute distress.  Skin is warm and dry.  HEENT is normal.  Neck is supple.  Chest is clear to auscultation with normal expansion.  Cardiovascular exam is regular rate and rhythm.  Abdominal exam nontender or distended. No masses palpated. Extremities show no edema. neuro grossly intact  A/P  1 coronary artery disease/coronary calcification-patient denies chest pain.  Previous nuclear study showed no ischemia.  Continue aspirin.  Intolerant to statins.  2 hyperlipidemia-patient is intolerant to statins.  Continue Repatha.  3 hypertension-patient's blood pressure is controlled.  Continue present medications.  4 dyspnea-felt secondary to COPD.  5 abnormal CT-renal mass noted on previous abdominal CT.  Patient states she is followed by urology for previous renal cell carcinoma and is scheduled for follow-up CT in October.  6 lower extremity edema-patient has developed lower extremity edema and some orthopnea which improved with Lasix  as needed.  I will discontinue HCTZ and instead treat with Lasix 40 mg daily.  Check potassium and renal function in 1 week.  Repeat echocardiogram.  Olga Millers, MD

## 2022-11-29 ENCOUNTER — Ambulatory Visit (INDEPENDENT_AMBULATORY_CARE_PROVIDER_SITE_OTHER): Payer: Medicare HMO | Admitting: Family Medicine

## 2022-12-01 ENCOUNTER — Ambulatory Visit: Payer: Medicare HMO | Admitting: Cardiology

## 2022-12-06 ENCOUNTER — Ambulatory Visit: Payer: Medicare HMO | Attending: Cardiology | Admitting: Cardiology

## 2022-12-06 ENCOUNTER — Encounter: Payer: Self-pay | Admitting: Cardiology

## 2022-12-06 VITALS — BP 126/70 | HR 60 | Ht 62.0 in | Wt 242.8 lb

## 2022-12-06 DIAGNOSIS — R600 Localized edema: Secondary | ICD-10-CM | POA: Diagnosis not present

## 2022-12-06 DIAGNOSIS — E785 Hyperlipidemia, unspecified: Secondary | ICD-10-CM

## 2022-12-06 DIAGNOSIS — R0609 Other forms of dyspnea: Secondary | ICD-10-CM

## 2022-12-06 DIAGNOSIS — I2510 Atherosclerotic heart disease of native coronary artery without angina pectoris: Secondary | ICD-10-CM

## 2022-12-06 DIAGNOSIS — I10 Essential (primary) hypertension: Secondary | ICD-10-CM | POA: Diagnosis not present

## 2022-12-06 DIAGNOSIS — M069 Rheumatoid arthritis, unspecified: Secondary | ICD-10-CM | POA: Diagnosis not present

## 2022-12-06 MED ORDER — FUROSEMIDE 20 MG PO TABS
40.0000 mg | ORAL_TABLET | Freq: Every day | ORAL | 3 refills | Status: DC
Start: 2022-12-06 — End: 2022-12-06

## 2022-12-06 MED ORDER — FUROSEMIDE 20 MG PO TABS: 40.0000 mg | ORAL_TABLET | Freq: Every day | ORAL | 3 refills | Status: DC

## 2022-12-06 NOTE — Patient Instructions (Signed)
Medication Instructions:   STOP hydrochlorothiazide  START FUROSEMIDE 40 MG ONCE DAILY= 2 OF THE 20 MG TABLETS ONCE DAILY  *If you need a refill on your cardiac medications before your next appointment, please call your pharmacy*   Lab Work:  Your physician recommends that you return for lab work in: ONE WEEK- DO NOT NEED TO FAST  If you have labs (blood work) drawn today and your tests are completely normal, you will receive your results only by: MyChart Message (if you have MyChart) OR A paper copy in the mail If you have any lab test that is abnormal or we need to change your treatment, we will call you to review the results.   Testing/Procedures:  Your physician has requested that you have an echocardiogram. Echocardiography is a painless test that uses sound waves to create images of your heart. It provides your doctor with information about the size and shape of your heart and how well your heart's chambers and valves are working. This procedure takes approximately one hour. There are no restrictions for this procedure. Please do NOT wear cologne, perfume, aftershave, or lotions (deodorant is allowed). Please arrive 15 minutes prior to your appointment time. [address]   Follow-Up: At [facility], you and your health needs are our priority.  As part of our continuing mission to provide you with exceptional heart care, we have created designated Provider Care Teams.  These Care Teams include your primary Cardiologist (physician) and Advanced Practice Providers (APPs -  Physician Assistants and Nurse Practitioners) who all work together to provide you with the care you need, when you need it.  We recommend signing up for the patient portal called "MyChart".  Sign up information is provided on this After Visit Summary.  MyChart is used to connect with patients for Virtual Visits (Telemedicine).  Patients are able to view lab/test results, encounter notes,  upcoming appointments, etc.  Non-urgent messages can be sent to your provider as well.   To learn more about what you can do with MyChart, go to [email or web address].    Your next appointment:   6 month(s)  Provider:   Olga Millers, MD

## 2022-12-07 ENCOUNTER — Encounter (INDEPENDENT_AMBULATORY_CARE_PROVIDER_SITE_OTHER): Payer: Self-pay | Admitting: Family Medicine

## 2022-12-07 ENCOUNTER — Ambulatory Visit (INDEPENDENT_AMBULATORY_CARE_PROVIDER_SITE_OTHER): Payer: Medicare HMO | Admitting: Family Medicine

## 2022-12-07 VITALS — BP 114/73 | HR 52 | Temp 98.2°F | Ht 62.0 in | Wt 238.0 lb

## 2022-12-07 DIAGNOSIS — E559 Vitamin D deficiency, unspecified: Secondary | ICD-10-CM

## 2022-12-07 DIAGNOSIS — E669 Obesity, unspecified: Secondary | ICD-10-CM | POA: Diagnosis not present

## 2022-12-07 DIAGNOSIS — R7303 Prediabetes: Secondary | ICD-10-CM

## 2022-12-07 DIAGNOSIS — Z6841 Body Mass Index (BMI) 40.0 and over, adult: Secondary | ICD-10-CM

## 2022-12-07 MED ORDER — VITAMIN D (ERGOCALCIFEROL) 1.25 MG (50000 UNIT) PO CAPS
50000.0000 [IU] | ORAL_CAPSULE | ORAL | 0 refills | Status: DC
Start: 2022-12-07 — End: 2023-02-27

## 2022-12-07 NOTE — Progress Notes (Signed)
Chief Complaint:   OBESITY Amanda Cordova is here to discuss her progress with her obesity treatment plan along with follow-up of her obesity related diagnoses. Amanda Cordova is on the Category 3 Plan and keeping a food journal and adhering to recommended goals of 1400-1600 calories and 90+ grams of protein and states she is following her eating plan approximately 50% of the time. Amanda Cordova states she is waling the dog for 45-60 minutes 7 times per week.  Today's visit was #: 3 Starting weight: 243 lbs Starting date: 11/08/2022 Today's weight: 238 lbs Today's date: 12/07/2022 Total lbs lost to date: 5 Total lbs lost since last in-office visit: 3  Interim History: Patient is still not feeling like herself.  On the tail end of a sore throat, nausea and fatigued.  She hasn't been eating much and hasn't been journaling or following the plan due to feeling under the weather. She is dedicated to meal plan and wants to get more consistently on plan now that she is feeling better.   Subjective:   1. Vitamin D deficiency Patient is on prescription vitamin D.  She denies nausea, vomiting, or muscle weakness but notes fatigue.  Her last vitamin D level was below half of goal.  2. Prediabetes Patient's last A1c was 5.9 and insulin 54.2.  She is not on medications.  Assessment/Plan:   1. Vitamin D deficiency Patient will continue prescription vitamin D 50,000 IU once weekly, and we will refill for 1 month.  - Vitamin D, Ergocalciferol, (DRISDOL) 1.25 MG (50000 UNIT) CAPS capsule; Take 1 capsule (50,000 Units total) by mouth every 7 (seven) days.  Dispense: 4 capsule; Refill: 0  2. Prediabetes Patient will continue working on her meal plan; goal is to be 75% on the plan by her next appointment.  3. BMI 40.0-44.9, adult (HCC)  4. Obesity with starting BMI of 44.4 Amanda Cordova is currently in the action stage of change. As such, her goal is to continue with weight loss efforts. She has agreed to the  Category 3 Plan and keeping a food journal and adhering to recommended goals of 1400-1600 calories and 90+ grams of protein daily.   Exercise goals: No exercise has been prescribed at this time.  Behavioral modification strategies: increasing lean protein intake, meal planning and cooking strategies, keeping healthy foods in the home, and planning for success.  Amanda Cordova has agreed to follow-up with our clinic in 2 to 3 weeks. She was informed of the importance of frequent follow-up visits to maximize her success with intensive lifestyle modifications for her multiple health conditions.   Objective:   Blood pressure 114/73, pulse (!) 52, temperature 98.2 F (36.8 C), height 5\' 2"  (1.575 m), weight 238 lb (108 kg), SpO2 97%. Body mass index is 43.53 kg/m.  General: Cooperative, alert, well developed, in no acute distress. HEENT: Conjunctivae and lids unremarkable. Cardiovascular: Regular rhythm.  Lungs: Normal work of breathing. Neurologic: No focal deficits.   Lab Results  Component Value Date   CREATININE 0.77 11/08/2022   BUN 14 11/08/2022   NA 145 (H) 11/08/2022   K 4.0 11/08/2022   CL 104 11/08/2022   CO2 25 11/08/2022   Lab Results  Component Value Date   ALT 12 11/08/2022   AST 23 11/08/2022   ALKPHOS 100 11/08/2022   BILITOT 0.5 11/08/2022   Lab Results  Component Value Date   HGBA1C 5.9 (H) 11/08/2022   HGBA1C 5.5 09/10/2021   HGBA1C 5.6 12/23/2020   HGBA1C 5.8 (H)  03/05/2020   Lab Results  Component Value Date   INSULIN 54.2 (H) 11/08/2022   Lab Results  Component Value Date   TSH 1.490 11/08/2022   Lab Results  Component Value Date   CHOL 121 11/08/2022   HDL 41 11/08/2022   LDLCALC 60 11/08/2022   TRIG 105 11/08/2022   CHOLHDL 2.0 07/29/2021   Lab Results  Component Value Date   VD25OH 24.1 (L) 11/08/2022   VD25OH 41.48 08/20/2020   VD25OH 47.7 03/05/2020   Lab Results  Component Value Date   WBC 8.6 11/08/2022   HGB 15.4 11/08/2022    HCT 47.4 (H) 11/08/2022   MCV 92 11/08/2022   PLT 323 11/08/2022   No results found for: "IRON", "TIBC", "FERRITIN"  Attestation Statements:   Reviewed by clinician on day of visit: allergies, medications, problem list, medical history, surgical history, family history, social history, and previous encounter notes.   I, Burt Knack, am acting as transcriptionist for Reuben Likes, MD.  I have reviewed the above documentation for accuracy and completeness, and I agree with the above. - Reuben Likes, MD

## 2022-12-12 DIAGNOSIS — R0609 Other forms of dyspnea: Secondary | ICD-10-CM | POA: Diagnosis not present

## 2022-12-12 DIAGNOSIS — R600 Localized edema: Secondary | ICD-10-CM | POA: Diagnosis not present

## 2022-12-13 LAB — BASIC METABOLIC PANEL
BUN/Creatinine Ratio: 15 (ref 12–28)
BUN: 12 mg/dL (ref 8–27)
CO2: 27 mmol/L (ref 20–29)
Calcium: 9.1 mg/dL (ref 8.7–10.3)
Chloride: 104 mmol/L (ref 96–106)
Creatinine, Ser: 0.78 mg/dL (ref 0.57–1.00)
Glucose: 127 mg/dL — ABNORMAL HIGH (ref 70–99)
Potassium: 3.5 mmol/L (ref 3.5–5.2)
Sodium: 144 mmol/L (ref 134–144)
eGFR: 85 mL/min/{1.73_m2} (ref >59)

## 2022-12-27 ENCOUNTER — Ambulatory Visit (HOSPITAL_COMMUNITY): Payer: Medicare HMO | Attending: Cardiology

## 2022-12-27 DIAGNOSIS — R0609 Other forms of dyspnea: Secondary | ICD-10-CM | POA: Insufficient documentation

## 2022-12-27 LAB — ECHOCARDIOGRAM COMPLETE
Area-P 1/2: 3.24 cm2
S' Lateral: 3.2 cm

## 2023-01-10 ENCOUNTER — Ambulatory Visit (INDEPENDENT_AMBULATORY_CARE_PROVIDER_SITE_OTHER): Payer: Medicare HMO | Admitting: Family Medicine

## 2023-01-10 ENCOUNTER — Encounter (INDEPENDENT_AMBULATORY_CARE_PROVIDER_SITE_OTHER): Payer: Self-pay | Admitting: Family Medicine

## 2023-01-10 VITALS — BP 115/78 | HR 64 | Temp 98.1°F | Ht 62.0 in | Wt 232.0 lb

## 2023-01-10 DIAGNOSIS — E669 Obesity, unspecified: Secondary | ICD-10-CM

## 2023-01-10 DIAGNOSIS — Z6841 Body Mass Index (BMI) 40.0 and over, adult: Secondary | ICD-10-CM | POA: Diagnosis not present

## 2023-01-10 DIAGNOSIS — I10 Essential (primary) hypertension: Secondary | ICD-10-CM | POA: Diagnosis not present

## 2023-01-10 DIAGNOSIS — E559 Vitamin D deficiency, unspecified: Secondary | ICD-10-CM

## 2023-01-10 NOTE — Progress Notes (Signed)
Chief Complaint:   OBESITY Amanda Cordova is here to discuss her progress with her obesity treatment plan along with follow-up of her obesity related diagnoses. Amanda Cordova is on the Category 3 Plan and keeping a food journal and adhering to recommended goals of 1400-1600 calories and 90+ grams of protein and states she is following her eating plan approximately (unknown)% of the time. Amanda Cordova states she is walking for 45-60 minutes 5 times per week.  Today's visit was #: 4 Starting weight: 243 lbs Starting date: 11/08/2022 Today's weight: 232 lbs Today's date: 01/10/2023 Total lbs lost to date: 11 Total lbs lost since last in-office visit: 6  Interim History: Patient had a low key birthday a few days ago and spent her day at home with kids.  Her husband was in the hospital for about a week with COVID.  She thankfully did not get covid.  She has been weighing and measuring food and drinking water constantly.  She is eating chicken or roast beef on a sandwich.  Next few weeks she is planning to stick close to home as her husband is still recovering from COVID. She is eating quite a bit more salads.  Subjective:   1. Essential hypertension Patient's blood pressure is very well-controlled today.  She denies chest pain, chest pressure, or headache.  She is on amlodipine 5 mg and Cozaar 50 mg.  2. Vitamin D deficiency Patient is on prescription vitamin D, and she will ask her PCP for a refill.  Assessment/Plan:   1. Essential hypertension Patient will continue her current medications at the same doses, and with no change in medication.  2. Vitamin D deficiency Patient will continue prescription vitamin D with no change in treatment.  3. BMI 40.0-44.9, adult (HCC)  4. Obesity with starting BMI of 44.4 Amanda Cordova is currently in the action stage of change. As such, her goal is to continue with weight loss efforts. She has agreed to the Category 3 Plan.   Exercise goals: All adults should  avoid inactivity. Some physical activity is better than none, and adults who participate in any amount of physical activity gain some health benefits.  Behavioral modification strategies: increasing lean protein intake, meal planning and cooking strategies, keeping healthy foods in the home, and planning for success.  Amanda Cordova has agreed to follow-up with our clinic in 3 to 4 weeks. She was informed of the importance of frequent follow-up visits to maximize her success with intensive lifestyle modifications for her multiple health conditions.   Objective:   Blood pressure 115/78, pulse 64, temperature 98.1 F (36.7 C), height 5\' 2"  (1.575 m), weight 232 lb (105.2 kg), SpO2 96%. Body mass index is 42.43 kg/m.  General: Cooperative, alert, well developed, in no acute distress. HEENT: Conjunctivae and lids unremarkable. Cardiovascular: Regular rhythm.  Lungs: Normal work of breathing. Neurologic: No focal deficits.   Lab Results  Component Value Date   CREATININE 0.78 12/12/2022   BUN 12 12/12/2022   NA 144 12/12/2022   K 3.5 12/12/2022   CL 104 12/12/2022   CO2 27 12/12/2022   Lab Results  Component Value Date   ALT 12 11/08/2022   AST 23 11/08/2022   ALKPHOS 100 11/08/2022   BILITOT 0.5 11/08/2022   Lab Results  Component Value Date   HGBA1C 5.9 (H) 11/08/2022   HGBA1C 5.5 09/10/2021   HGBA1C 5.6 12/23/2020   HGBA1C 5.8 (H) 03/05/2020   Lab Results  Component Value Date   INSULIN 54.2 (H)  11/08/2022   Lab Results  Component Value Date   TSH 1.490 11/08/2022   Lab Results  Component Value Date   CHOL 121 11/08/2022   HDL 41 11/08/2022   LDLCALC 60 11/08/2022   TRIG 105 11/08/2022   CHOLHDL 2.0 07/29/2021   Lab Results  Component Value Date   VD25OH 24.1 (L) 11/08/2022   VD25OH 41.48 08/20/2020   VD25OH 47.7 03/05/2020   Lab Results  Component Value Date   WBC 8.6 11/08/2022   HGB 15.4 11/08/2022   HCT 47.4 (H) 11/08/2022   MCV 92 11/08/2022   PLT  323 11/08/2022   No results found for: "IRON", "TIBC", "FERRITIN"  Attestation Statements:   Reviewed by clinician on day of visit: allergies, medications, problem list, medical history, surgical history, family history, social history, and previous encounter notes.   I, Burt Knack, am acting as transcriptionist for Reuben Likes, MD.  I have reviewed the above documentation for accuracy and completeness, and I agree with the above. - Reuben Likes, MD

## 2023-01-16 DIAGNOSIS — C642 Malignant neoplasm of left kidney, except renal pelvis: Secondary | ICD-10-CM | POA: Diagnosis not present

## 2023-01-19 DIAGNOSIS — R160 Hepatomegaly, not elsewhere classified: Secondary | ICD-10-CM | POA: Diagnosis not present

## 2023-01-19 DIAGNOSIS — K439 Ventral hernia without obstruction or gangrene: Secondary | ICD-10-CM | POA: Diagnosis not present

## 2023-01-19 DIAGNOSIS — C642 Malignant neoplasm of left kidney, except renal pelvis: Secondary | ICD-10-CM | POA: Diagnosis not present

## 2023-01-19 DIAGNOSIS — Z905 Acquired absence of kidney: Secondary | ICD-10-CM | POA: Diagnosis not present

## 2023-01-23 DIAGNOSIS — N200 Calculus of kidney: Secondary | ICD-10-CM | POA: Diagnosis not present

## 2023-01-23 DIAGNOSIS — C642 Malignant neoplasm of left kidney, except renal pelvis: Secondary | ICD-10-CM | POA: Diagnosis not present

## 2023-01-27 ENCOUNTER — Encounter: Payer: Self-pay | Admitting: Acute Care

## 2023-01-27 ENCOUNTER — Encounter: Payer: Self-pay | Admitting: Family Medicine

## 2023-01-27 DIAGNOSIS — M79673 Pain in unspecified foot: Secondary | ICD-10-CM

## 2023-02-06 ENCOUNTER — Encounter (INDEPENDENT_AMBULATORY_CARE_PROVIDER_SITE_OTHER): Payer: Self-pay | Admitting: Family Medicine

## 2023-02-06 ENCOUNTER — Other Ambulatory Visit (INDEPENDENT_AMBULATORY_CARE_PROVIDER_SITE_OTHER): Payer: Self-pay | Admitting: Family Medicine

## 2023-02-06 ENCOUNTER — Ambulatory Visit (INDEPENDENT_AMBULATORY_CARE_PROVIDER_SITE_OTHER): Payer: Medicare HMO | Admitting: Family Medicine

## 2023-02-06 VITALS — BP 123/77 | HR 63 | Temp 98.3°F | Ht 62.0 in | Wt 238.0 lb

## 2023-02-06 DIAGNOSIS — E669 Obesity, unspecified: Secondary | ICD-10-CM | POA: Diagnosis not present

## 2023-02-06 DIAGNOSIS — R739 Hyperglycemia, unspecified: Secondary | ICD-10-CM | POA: Diagnosis not present

## 2023-02-06 DIAGNOSIS — F509 Eating disorder, unspecified: Secondary | ICD-10-CM

## 2023-02-06 DIAGNOSIS — E559 Vitamin D deficiency, unspecified: Secondary | ICD-10-CM

## 2023-02-06 DIAGNOSIS — Z6841 Body Mass Index (BMI) 40.0 and over, adult: Secondary | ICD-10-CM

## 2023-02-06 MED ORDER — LOMAIRA 8 MG PO TABS
ORAL_TABLET | ORAL | 0 refills | Status: DC
Start: 2023-02-06 — End: 2023-02-27

## 2023-02-06 MED ORDER — TOPIRAMATE 25 MG PO TABS
ORAL_TABLET | ORAL | 0 refills | Status: DC
Start: 2023-02-06 — End: 2023-02-27

## 2023-02-06 NOTE — Progress Notes (Signed)
Chief Complaint:   OBESITY Amanda Cordova is here to discuss her progress with her obesity treatment plan along with follow-up of her obesity related diagnoses. Amanda Cordova is on the Category 3 Plan and states she is following her eating plan approximately 95% of the time. Amanda Cordova states she is walking for 45-60 minutes 7 times per week.  Today's visit was #: 5 Starting weight: 243 lbs Starting date: 11/08/2022 Today's weight: 238 lbs Today's date: 02/06/2023 Total lbs lost to date: 5 Total lbs lost since last in-office visit: 0  Interim History: Patient feels the program is not working for her.  She has had increase in craving for orange juice and chocolate.  She is feeling exhausted and she feels frustrated. She is noticing leg swelling and face swelling. She isn't normally a snacker. She is open to medication options for obesity.  Unfortunately with Medicare she is limited options.   Subjective:   1. Vitamin D deficiency Patient is on prescription vitamin D.  She denies nausea, vomiting, or muscle weakness but notes fatigue.  2. Hyperglycemia Patient's last A1c was 5.9 and insulin 54.2.  She is not on medications.  She notes some increase in carbohydrate cravings recently.  3. Eating disorder, unspecified type Patient noticing increase in her cravings for carbohydrates in the form of orange juice and chocolate.  Physiologically she has prediabetes and insulin resistance both of which play into this. She is able to get all food in.  Assessment/Plan:   1. Vitamin D deficiency We will check labs today, and we will follow-up at patient's next appointment.  - VITAMIN D 25 Hydroxy (Vit-D Deficiency, Fractures)  2. Hyperglycemia We will check labs today, and we will follow-up at patient's next appointment.  - Comprehensive metabolic panel - Hemoglobin A1c - Insulin, random  3. Eating disorder, unspecified type Medication options were discussed extensively with patient today.   Given availability, cost, insurance coverage and other medical comorbidities the decision was reached to start medications Lomaira and Topiramate.  These medications will be used as a substitute for brand name Qsymia in doses that are synanomous.  Patient understands this is an off label usage.  We discussed the titration schedule with the goal of 5% weight loss at 3 months at a treatment dose.  The first two weeks will be a starting dose of 25mg  of Topiramate and 4mg  of Lomaira.  After two weeks the patient will increase to 50mg  of Topiramate and 8mg  of Lomaira and will stay on this dose until the next appointment.  Controlled substance contract was discussed and signed today.  Prescriptions sent in. Starting weight of 238.  First weight goal of 11.5lb or weight of 226.5lb by early January.   - Phentermine HCl (LOMAIRA) 8 MG TABS; Take 0.5 tablets (4 mg total) by mouth daily for 14 days, THEN 1 tablet (8 mg total) daily for 14 days.  Dispense: 23 tablet; Refill: 0 - topiramate (TOPAMAX) 25 MG tablet; Take 1 tablet (25 mg total) by mouth daily for 14 days, THEN 2 tablets (50 mg total) daily for 14 days.  Dispense: 45 tablet; Refill: 0  4. BMI 40.0-44.9, adult (HCC)  5. Obesity with starting BMI of 44.4 Amanda Cordova is currently in the action stage of change. As such, her goal is to continue with weight loss efforts. She has agreed to the Category 3 Plan.   Exercise goals: All adults should avoid inactivity. Some physical activity is better than none, and adults who participate in any  amount of physical activity gain some health benefits.  Behavioral modification strategies: increasing lean protein intake, meal planning and cooking strategies, keeping healthy foods in the home, and planning for success.  Amanda Cordova has agreed to follow-up with our clinic in 4 weeks. She was informed of the importance of frequent follow-up visits to maximize her success with intensive lifestyle modifications for her multiple  health conditions.   Amanda Cordova was informed we would discuss her lab results at her next visit unless there is a critical issue that needs to be addressed sooner. Amanda Cordova agreed to keep her next visit at the agreed upon time to discuss these results.  Objective:   Blood pressure 123/77, pulse 63, temperature 98.3 F (36.8 C), height 5\' 2"  (1.575 m), weight 238 lb (108 kg), SpO2 97%. Body mass index is 43.53 kg/m.  General: Cooperative, alert, well developed, in no acute distress. HEENT: Conjunctivae and lids unremarkable. Cardiovascular: Regular rhythm.  Lungs: Normal work of breathing. Neurologic: No focal deficits.   Lab Results  Component Value Date   CREATININE 0.78 12/12/2022   BUN 12 12/12/2022   NA 144 12/12/2022   K 3.5 12/12/2022   CL 104 12/12/2022   CO2 27 12/12/2022   Lab Results  Component Value Date   ALT 12 11/08/2022   AST 23 11/08/2022   ALKPHOS 100 11/08/2022   BILITOT 0.5 11/08/2022   Lab Results  Component Value Date   HGBA1C 5.9 (H) 11/08/2022   HGBA1C 5.5 09/10/2021   HGBA1C 5.6 12/23/2020   HGBA1C 5.8 (H) 03/05/2020   Lab Results  Component Value Date   INSULIN 54.2 (H) 11/08/2022   Lab Results  Component Value Date   TSH 1.490 11/08/2022   Lab Results  Component Value Date   CHOL 121 11/08/2022   HDL 41 11/08/2022   LDLCALC 60 11/08/2022   TRIG 105 11/08/2022   CHOLHDL 2.0 07/29/2021   Lab Results  Component Value Date   VD25OH 24.1 (L) 11/08/2022   VD25OH 41.48 08/20/2020   VD25OH 47.7 03/05/2020   Lab Results  Component Value Date   WBC 8.6 11/08/2022   HGB 15.4 11/08/2022   HCT 47.4 (H) 11/08/2022   MCV 92 11/08/2022   PLT 323 11/08/2022   No results found for: "IRON", "TIBC", "FERRITIN"  Attestation Statements:   Reviewed by clinician on day of visit: allergies, medications, problem list, medical history, surgical history, family history, social history, and previous encounter notes.   I, Burt Knack, am acting  as transcriptionist for Reuben Likes, MD.  I have reviewed the above documentation for accuracy and completeness, and I agree with the above. - Reuben Likes, MD

## 2023-02-07 LAB — COMPREHENSIVE METABOLIC PANEL
ALT: 10 [IU]/L (ref 0–32)
AST: 17 [IU]/L (ref 0–40)
Albumin: 3.9 g/dL (ref 3.9–4.9)
Alkaline Phosphatase: 96 [IU]/L (ref 44–121)
BUN/Creatinine Ratio: 15 (ref 12–28)
BUN: 12 mg/dL (ref 8–27)
Bilirubin Total: 0.5 mg/dL (ref 0.0–1.2)
CO2: 23 mmol/L (ref 20–29)
Calcium: 9.4 mg/dL (ref 8.7–10.3)
Chloride: 105 mmol/L (ref 96–106)
Creatinine, Ser: 0.78 mg/dL (ref 0.57–1.00)
Globulin, Total: 2.6 g/dL (ref 1.5–4.5)
Glucose: 109 mg/dL — ABNORMAL HIGH (ref 70–99)
Potassium: 3.8 mmol/L (ref 3.5–5.2)
Sodium: 144 mmol/L (ref 134–144)
Total Protein: 6.5 g/dL (ref 6.0–8.5)
eGFR: 85 mL/min/{1.73_m2} (ref >59)

## 2023-02-07 LAB — HEMOGLOBIN A1C
Est. average glucose Bld gHb Est-mCnc: 126 mg/dL
Hgb A1c MFr Bld: 6 % — ABNORMAL HIGH (ref 4.8–5.6)

## 2023-02-07 LAB — VITAMIN D 25 HYDROXY (VIT D DEFICIENCY, FRACTURES): Vit D, 25-Hydroxy: 46.1 ng/mL (ref 30.0–100.0)

## 2023-02-07 LAB — INSULIN, RANDOM: INSULIN: 31.6 u[IU]/mL — ABNORMAL HIGH (ref 2.6–24.9)

## 2023-02-16 ENCOUNTER — Encounter: Payer: Self-pay | Admitting: Podiatry

## 2023-02-16 ENCOUNTER — Ambulatory Visit: Payer: Medicare HMO | Admitting: Podiatry

## 2023-02-16 DIAGNOSIS — M7752 Other enthesopathy of left foot: Secondary | ICD-10-CM | POA: Diagnosis not present

## 2023-02-16 DIAGNOSIS — M7751 Other enthesopathy of right foot: Secondary | ICD-10-CM

## 2023-02-16 MED ORDER — TRIAMCINOLONE ACETONIDE 10 MG/ML IJ SUSP
10.0000 mg | Freq: Once | INTRAMUSCULAR | Status: AC
  Administered 2023-02-16: 10 mg via INTRA_ARTICULAR

## 2023-02-16 NOTE — Progress Notes (Signed)
Subjective:   Patient ID: Amanda Cordova, female   DOB: 62 y.o.   MRN: [id number]   HPI Patient presents with pain in the balls of both feet stating they have been getting sore and a lesion on the outside of the right foot   ROS      Objective:  Physical Exam  Neurovascular status intact with inflammation of the second and third MPJs bilateral with fluid buildup and keratotic lesion of the lateral aspect right foot     Assessment:  Inflammatory capsulitis lesser MPJs bilateral and keratotic tissue right lateral foot     Plan:  H&P reviewed I went ahead today did sterile prep and I injected the capsule bilateral periarticular 3 mg dexamethasone Kenalog 5 mg Xylocaine debrided lesion right courtesy reappoint as needed

## 2023-02-20 ENCOUNTER — Other Ambulatory Visit: Payer: Self-pay | Admitting: Family Medicine

## 2023-02-20 DIAGNOSIS — H8190 Unspecified disorder of vestibular function, unspecified ear: Secondary | ICD-10-CM

## 2023-02-27 ENCOUNTER — Encounter (INDEPENDENT_AMBULATORY_CARE_PROVIDER_SITE_OTHER): Payer: Self-pay | Admitting: Family Medicine

## 2023-02-27 ENCOUNTER — Ambulatory Visit (INDEPENDENT_AMBULATORY_CARE_PROVIDER_SITE_OTHER): Payer: Medicare HMO | Admitting: Family Medicine

## 2023-02-27 ENCOUNTER — Ambulatory Visit
Admission: RE | Admit: 2023-02-27 | Discharge: 2023-02-27 | Disposition: A | Discharge status: Home / Self Care | Payer: Medicare HMO | Source: Ambulatory Visit | Attending: Family Medicine | Admitting: Family Medicine

## 2023-02-27 ENCOUNTER — Other Ambulatory Visit (INDEPENDENT_AMBULATORY_CARE_PROVIDER_SITE_OTHER): Payer: Self-pay | Admitting: Family Medicine

## 2023-02-27 DIAGNOSIS — J439 Emphysema, unspecified: Secondary | ICD-10-CM | POA: Diagnosis not present

## 2023-02-27 DIAGNOSIS — I700 Atherosclerosis of aorta: Secondary | ICD-10-CM | POA: Diagnosis not present

## 2023-02-27 DIAGNOSIS — E559 Vitamin D deficiency, unspecified: Secondary | ICD-10-CM | POA: Diagnosis not present

## 2023-02-27 DIAGNOSIS — E669 Obesity, unspecified: Secondary | ICD-10-CM

## 2023-02-27 DIAGNOSIS — F509 Eating disorder, unspecified: Secondary | ICD-10-CM

## 2023-02-27 DIAGNOSIS — Z87891 Personal history of nicotine dependence: Secondary | ICD-10-CM

## 2023-02-27 DIAGNOSIS — Z122 Encounter for screening for malignant neoplasm of respiratory organs: Secondary | ICD-10-CM | POA: Diagnosis not present

## 2023-02-27 DIAGNOSIS — Z6841 Body Mass Index (BMI) 40.0 and over, adult: Secondary | ICD-10-CM

## 2023-02-27 MED ORDER — LOMAIRA 8 MG PO TABS
8.0000 mg | ORAL_TABLET | Freq: Every day | ORAL | 0 refills | Status: DC
Start: 2023-02-27 — End: 2023-03-28

## 2023-02-27 MED ORDER — VITAMIN D (ERGOCALCIFEROL) 1.25 MG (50000 UNIT) PO CAPS: 50000.0000 [IU] | ORAL_CAPSULE | ORAL | 0 refills | Status: DC

## 2023-02-27 MED ORDER — TOPIRAMATE 50 MG PO TABS: 50.0000 mg | ORAL_TABLET | Freq: Every day | ORAL | 0 refills | Status: DC

## 2023-02-27 NOTE — Assessment & Plan Note (Addendum)
Patient started on combination lomaira and topiramate.  She is down 11lbs from starting weight of 238lb.  Goal weight loss is 5% is 11.5lb. She is on lowest treatment dose currently.  Heart rate and blood pressure WNL.  PDMP checked today with no concerns.  No side effects noticed by patient.  She is experiencing good craving control and a change in satiety. Refill current dose of 8mg  lomaira and 50mg  topiramate.

## 2023-02-27 NOTE — Assessment & Plan Note (Addendum)
Discussed importance of vitamin d supplementation.  Vitamin d supplementation has been shown to decrease fatigue, decrease risk of progression to insulin resistance and then prediabetes, decreases risk of falling in older age and can even assist in decreasing depressive symptoms in PTSD.   Prescription for Vitamin D sent in- continue current supplementation.

## 2023-02-27 NOTE — Progress Notes (Signed)
SUBJECTIVE:  Chief Complaint: Obesity  Interim History: Patient has been following plan 100% of the time.  She is getting outdoors more and getting her husband out.  She is walking her dog and getting her husband to go for walks too.  Last appointment patient started combo phentermine and topiramate and she is no longer craving orange juice and chocolate.  She is no longer tired and feeling exhausted.  For Thanksgiving she is staying home with her husband and two of her friends.  She is planning to go to [place] for Christmas.  Amanda Cordova is here to discuss her progress with her obesity treatment plan. She is on the Category 3 Plan and states she is following her eating plan approximately 100 % of the time. She states she is exercising 45 minutes 7 times per week.   OBJECTIVE: Visit Diagnoses: Problem List Items Addressed This Visit       Other   Vitamin D deficiency    Discussed importance of vitamin d supplementation.  Vitamin d supplementation has been shown to decrease fatigue, decrease risk of progression to insulin resistance and then prediabetes, decreases risk of falling in older age and can even assist in decreasing depressive symptoms in PTSD.   Prescription for Vitamin D sent in- continue current supplementation.       Relevant Medications   Vitamin D, Ergocalciferol, (DRISDOL) 1.25 MG (50000 UNIT) CAPS capsule   Disorder of eating    Patient started on combination lomaira and topiramate.  She is down 11lbs from starting weight of 238lb.  Goal weight loss is 5% is 11.5lb. She is on lowest treatment dose currently.  Heart rate and blood pressure WNL.  PDMP checked today with no concerns.  No side effects noticed by patient.  She is experiencing good craving control and a change in satiety. Refill current dose of 8mg  lomaira and 50mg  topiramate.      Relevant Medications   topiramate (TOPAMAX) 50 MG tablet   Phentermine HCl (LOMAIRA) 8 MG TABS   Other Visit Diagnoses     Obesity  with starting BMI of 44.4    -  Primary   Relevant Medications   Phentermine HCl (LOMAIRA) 8 MG TABS   BMI 40.0-44.9, adult (HCC)       Relevant Medications   Phentermine HCl (LOMAIRA) 8 MG TABS       Vitals Temp: 97.9 F (36.6 C) BP: 127/77 Pulse Rate: 72 SpO2: 98 %   Anthropometric Measurements Height: 5\' 2"  (1.575 m) Weight: 227 lb (103 kg) BMI (Calculated): 41.51 Weight at Last Visit: 238 lb Weight Lost Since Last Visit: 11 lb Weight Gained Since Last Visit: 0 Starting Weight: 243 lb Total Weight Loss (lbs): 16 lb (7.258 kg)   Body Composition  Body Fat %: 49.1 % Fat Mass (lbs): 111.8 lbs Muscle Mass (lbs): 110.2 lbs Total Body Water (lbs): 79.8 lbs Visceral Fat Rating : 17   Other Clinical Data Today's Visit #: 6 Starting Date: 10/19/22     ASSESSMENT AND PLAN:  Diet: Amanda Cordova is currently in the action stage of change. As such, her goal is to continue with weight loss efforts. She has agreed to Category 3 Plan.  She feels good continuing with current plan and making no changes with the exception of the bit of experimentation she is doing within the parameters.  Exercise: Amanda Cordova has been instructed to continue exercising as is for weight loss and overall health benefits.   Behavior Modification:  We discussed  the following Behavioral Modification Strategies today: increasing lean protein intake, increasing vegetables, meal planning and cooking strategies, ways to avoid nighttime snacking, and better snacking choices. We discussed various medication options to help Amanda Cordova with her weight loss efforts and we both agreed to continue current dose of combination lomaira and topiramate.  No follow-ups on file.Marland Kitchen She was informed of the importance of frequent follow up visits to maximize her success with intensive lifestyle modifications for her multiple health conditions.  Attestation Statements:   Reviewed by clinician on day of visit: allergies,  medications, problem list, medical history, surgical history, family history, social history, and previous encounter notes.   Reuben Likes, MD

## 2023-03-07 ENCOUNTER — Other Ambulatory Visit: Payer: Self-pay | Admitting: Cardiology

## 2023-03-15 ENCOUNTER — Encounter: Payer: Self-pay | Admitting: Family Medicine

## 2023-03-17 ENCOUNTER — Ambulatory Visit: Payer: Medicare HMO | Admitting: Family Medicine

## 2023-03-20 ENCOUNTER — Ambulatory Visit (INDEPENDENT_AMBULATORY_CARE_PROVIDER_SITE_OTHER): Payer: Medicare HMO | Admitting: Family Medicine

## 2023-03-20 ENCOUNTER — Encounter: Payer: Self-pay | Admitting: Family Medicine

## 2023-03-20 VITALS — BP 124/80 | HR 76 | Temp 98.6°F | Resp 16 | Ht 62.0 in | Wt 229.5 lb

## 2023-03-20 DIAGNOSIS — I2510 Atherosclerotic heart disease of native coronary artery without angina pectoris: Secondary | ICD-10-CM

## 2023-03-20 DIAGNOSIS — I700 Atherosclerosis of aorta: Secondary | ICD-10-CM | POA: Diagnosis not present

## 2023-03-20 DIAGNOSIS — M25552 Pain in left hip: Secondary | ICD-10-CM

## 2023-03-20 DIAGNOSIS — I10 Essential (primary) hypertension: Secondary | ICD-10-CM

## 2023-03-20 DIAGNOSIS — H8190 Unspecified disorder of vestibular function, unspecified ear: Secondary | ICD-10-CM

## 2023-03-20 MED ORDER — AMLODIPINE BESYLATE 5 MG PO TABS: 5.0000 mg | ORAL_TABLET | Freq: Every day | ORAL | 3 refills | Status: DC

## 2023-03-20 NOTE — Assessment & Plan Note (Signed)
S/P craniotomy for acoustic neuroma. Problem has been stable. Continue Alprazolam 0.25 mg daily as needed. PMP reviewed. F/U in 6 months.

## 2023-03-20 NOTE — Patient Instructions (Addendum)
A few things to remember from today's visit:  Disorder of vestibular function, unspecified laterality  Primary hypertension - Plan: amLODipine (NORVASC) 5 MG tablet  Atherosclerosis of aorta (HCC) No changes today.  If you need refills for medications you take chronically, please call your pharmacy. Do not use My Chart to request refills or for acute issues that need immediate attention. If you send a my chart message, it may take a few days to be addressed, specially if I am not in the office.  Please be sure medication list is accurate. If a new problem present, please set up appointment sooner than planned today.

## 2023-03-20 NOTE — Assessment & Plan Note (Signed)
Probably has been adequately controlled. Continue amlodipine 5 mg daily, prescription changed from 10 mg, so she does not need to split tablet. Continue Losartan 50 mg daily and low-salt diet. Continue monitoring BP at home.

## 2023-03-20 NOTE — Progress Notes (Signed)
HPI: Amanda Cordova is a 62 y.o. female with a PMHx significant for atherosclerosis of aorta, CAD, HTN, OSA, emphysema of lung, peripheral neuropathy, vestibular disorder, DDD, HLD, prediabetes, Raynaud's phenomenon without gangrene, and insomnia, among others, who is here today for chronic disease management.   Last seen on 09/14/2022  She is following with the weight loss clinic.  She is currently taking phentermine 8 mg daily and Topiramate 50 mg daily.  Exercise: She says she is walking her dog daily.  She has noted wt loss.  Hypertension:  Medications: Currently on amlodipine 5 mg daily and losartan 50 mg daily.  She also takes furosemide 40 mg daily, increased by her cardiologist a few months ago..  BP readings at home: She has been checking her BP regularly at home. She says they have been very good.  CAD: She is working on rescheduling her next cardiology appointment, cancelled  last one 06/2022.  Negative for unusual or severe headache, visual changes, exertional chest pain, dyspnea, orthopnea,PND,focal weakness, or edema.  Lab Results  Component Value Date   NA 144 02/06/2023   CL 105 02/06/2023   K 3.8 02/06/2023   CO2 23 02/06/2023   BUN 12 02/06/2023   CREATININE 0.78 02/06/2023   EGFR 85 02/06/2023   CALCIUM 9.4 02/06/2023   ALBUMIN 3.9 02/06/2023   GLUCOSE 109 (H) 02/06/2023   Vestibular disorder:  Residual problem after left-sided craniotomy in 2011 to remove acoustic neuroma. She is on Alprazolam 0.25 mg daily as needed, which helps with dizziness. She has tolerated medication well.  Concerns today:   Hip pain:  She reports she is having some pain and burning sensation in her left hip for the last month. She says it is occasionally painful while bearing weight or lying down om her left side. She denies any numbness or tingling. Negative for saddle anesthesia,changes in bowel/bladder function. She has taking ibuprofen.  She mentions she has not  received the results of her lung cancer screening on 02/27/2023.  Lung CT 02/27/23: No pleural fluid. Mild centrilobular emphysema.Pulmonary nodules of maximally volume derived equivalent diameter 4.7 mm, similar. 1. Lung-RADS 2, benign appearance or behavior. Continue annual screening with low-dose chest CT without contrast in 12 months. 2. Hepatic steatosis. 3. Aortic Atherosclerosis (ICD10-I70.0) and Emphysema (ICD10-J43.9).  Review of Systems  Constitutional:  Negative for activity change, appetite change and fever.  HENT:  Negative for mouth sores, nosebleeds and sore throat.   Respiratory:  Negative for cough and wheezing.   Gastrointestinal:  Negative for abdominal pain, nausea and vomiting.       Negative for changes in bowel habits.  Genitourinary:  Negative for decreased urine volume, dysuria and hematuria.  Musculoskeletal:  Positive for arthralgias and gait problem.  Neurological:  Negative for syncope and facial asymmetry.  Psychiatric/Behavioral:  Negative for confusion and hallucinations.   See other pertinent positives and negatives in HPI.  Current Outpatient Medications on File Prior to Visit  Medication Sig Dispense Refill   albuterol (VENTOLIN HFA) 108 (90 Base) MCG/ACT inhaler INHALE 2 PUFFS INTO THE LUNGS EVERY 6 HOURS AS NEEDED FOR WHEEZING OR SHORTNESS OF BREATH 18 g 2   ALPRAZolam (XANAX) 0.25 MG tablet TAKE 1 TABLET(0.25 MG) BY MOUTH DAILY AS NEEDED FOR ANXIETY 30 tablet 3   aspirin EC 81 MG tablet Take 1 tablet (81 mg total) by mouth daily. Swallow whole. 90 tablet 3   furosemide (LASIX) 20 MG tablet Take 2 tablets (40 mg total) by mouth  daily. 180 tablet 3   losartan (COZAAR) 50 MG tablet TAKE 1 TABLET(50 MG) BY MOUTH DAILY 90 tablet 2   oxybutynin (DITROPAN-XL) 5 MG 24 hr tablet Take 5 mg by mouth daily.     Phentermine HCl (LOMAIRA) 8 MG TABS Take 1 tablet (8 mg total) by mouth daily. 30 tablet 0   REPATHA SURECLICK 140 MG/ML SOAJ ADMINISTER 1 ML UNDER THE  SKIN EVERY 14 DAYS 2 mL 11   Respiratory Therapy Supplies (CARETOUCH 2 CPAP HOSE HANGER) MISC      SPIRIVA RESPIMAT 2.5 MCG/ACT AERS INHALE 2 PUFFS INTO THE LUNGS DAILY. 12 g 3   topiramate (TOPAMAX) 50 MG tablet Take 1 tablet (50 mg total) by mouth daily. 30 tablet 0   Vitamin D, Ergocalciferol, (DRISDOL) 1.25 MG (50000 UNIT) CAPS capsule Take 1 capsule (50,000 Units total) by mouth every 7 (seven) days. 4 capsule 0   No current facility-administered medications on file prior to visit.    Past Medical History:  Diagnosis Date   Acoustic neuroma (HCC)    Back pain    CAD (coronary artery disease)    Cancer of kidney (HCC)    Chronic kidney disease    COPD (chronic obstructive pulmonary disease) (HCC)    Dyspnea    Emphysema/COPD (HCC)    Family history of bladder cancer    Family history of breast cancer    Family history of thyroid cancer    Gallbladder problem    Hyperlipemia    Hypertension    Joint pain    Neuropathy    Optic neuritis    Pre-diabetes    Prediabetes    Rheumatoid arthritis (HCC)    Sleep apnea    wears cpap   SOB (shortness of breath)    Allergies  Allergen Reactions   Gabapentin Anaphylaxis and Swelling    eyes swelled, throat, hands,   Tramadol Anaphylaxis and Swelling    Throat and hands   Crestor [Rosuvastatin] Other (See Comments)    Muscle aches    Duloxetine Nausea And Vomiting   Lipitor [Atorvastatin]     myalgias   Pravastatin     myalgias   Zetia [Ezetimibe]     myalgias   Lisinopril Nausea Only    Social History   Socioeconomic History   Marital status: Married    Spouse name: Not on file   Number of children: 4   Years of education: 12   Highest education level: GED or equivalent  Occupational History   Occupation: Disabled  Tobacco Use   Smoking status: Former    Current packs/day: 0.00    Average packs/day: 1 pack/day for 35.0 years (35.0 ttl pk-yrs)    Types: Cigarettes    Start date: 01/10/1983    Quit date:  01/09/2018    Years since quitting: 5.1    Passive exposure: Current   Smokeless tobacco: Never  Vaping Use   Vaping status: Never Used  Substance and Sexual Activity   Alcohol use: Not Currently   Drug use: Never   Sexual activity: Yes  Other Topics Concern   Not on file  Social History Narrative   Lives at home with her husband and her great-grandson.   Right-handed.   2-3 cups caffeine per day.    Social Determinants of Health   Financial Resource Strain: Medium Risk (03/13/2023)   Overall Financial Resource Strain (CARDIA)    Difficulty of Paying Living Expenses: Somewhat hard  Food Insecurity: Food Insecurity Present (  03/13/2023)   Hunger Vital Sign    Worried About Running Out of Food in the Last Year: Never true    Ran Out of Food in the Last Year: Sometimes true  Transportation Needs: No Transportation Needs (03/13/2023)   PRAPARE - Administrator, Civil Service (Medical): No    Lack of Transportation (Non-Medical): No  Physical Activity: Sufficiently Active (03/13/2023)   Exercise Vital Sign    Days of Exercise per Week: 7 days    Minutes of Exercise per Session: 60 min  Stress: No Stress Concern Present (03/13/2023)   [employer] of Occupational Health - Occupational Stress Questionnaire    Feeling of Stress : Not at all  Social Connections: Unknown (03/13/2023)   Social Connection and Isolation Panel [NHANES]    Frequency of Communication with Friends and Family: More than three times a week    Frequency of Social Gatherings with Friends and Family: Twice a week    Attends Religious Services: Patient declined    Database administrator or Organizations: No    Attends Banker Meetings: Not on file    Marital Status: Married    Vitals:   03/20/23 1112  BP: 124/80  Pulse: 76  Resp: 16  Temp: 98.6 F (37 C)  SpO2: 98%   Wt Readings from Last 3 Encounters:  03/20/23 229 lb 8 oz (104.1 kg)  02/27/23 227 lb (103 kg)  02/06/23  238 lb (108 kg)  Body mass index is 41.98 kg/m.  Physical Exam Vitals and nursing note reviewed.  Constitutional:      General: She is not in acute distress.    Appearance: She is well-developed.  HENT:     Head: Normocephalic and atraumatic.     Mouth/Throat:     Mouth: Mucous membranes are dry.     Pharynx: Oropharynx is clear.  Eyes:     Conjunctiva/sclera: Conjunctivae normal.  Cardiovascular:     Rate and Rhythm: Normal rate and regular rhythm.     Pulses:          Posterior tibial pulses are 2+ on the right side and 2+ on the left side.     Heart sounds: No murmur heard. Pulmonary:     Effort: Pulmonary effort is normal. No respiratory distress.     Breath sounds: Normal breath sounds.  Abdominal:     Palpations: Abdomen is soft. There is no hepatomegaly or mass.     Tenderness: There is no abdominal tenderness.  Musculoskeletal:     Left hip: Normal range of motion.     Right lower leg: No edema.     Left lower leg: No edema.     Comments: Mild posterior left hip pain with internal rotation and full flexion.Mildly pain with major trochanter palpation.  Lymphadenopathy:     Cervical: No cervical adenopathy.  Skin:    General: Skin is warm.     Findings: No erythema or rash.  Neurological:     General: No focal deficit present.     Mental Status: She is alert and oriented to person, place, and time.     Comments: Mildly unstable, antalgic gait, assisted with a cane  Psychiatric:        Mood and Affect: Mood and affect normal.   ASSESSMENT AND PLAN:  Amanda Cordova was seen today for chronic disease management.   Disorder of vestibular function, unspecified laterality Assessment & Plan: S/P craniotomy for acoustic neuroma. Problem has been  stable. Continue Alprazolam 0.25 mg daily as needed. PMP reviewed. F/U in 6 months.  Primary hypertension Assessment & Plan: Probably has been adequately controlled. Continue amlodipine 5 mg daily, prescription changed  from 10 mg, so she does not need to split tablet. Continue Losartan 50 mg daily and low-salt diet. Continue monitoring BP at home.  Orders: -     amLODIPine Besylate; Take 1 tablet (5 mg total) by mouth daily.  Dispense: 90 tablet; Refill: 3  Atherosclerosis of aorta [facility]) Assessment & Plan: Seen on imaging, most recently on chest CT she had for lung cancer screening, 02/27/23. Results discussed. Has not tolerated statins in the past. Currently on Repatha 140 mg q 2 weeks and Aspirin 81 mg daily.  Coronary artery disease involving native coronary artery of native heart without angina pectoris Assessment & Plan: Currently on amlodipine 5 mg daily, Repatha 140 mg q 2 weeks, and Aspirin 81 mg daily. Last LDL 60 in 10/2022. She follows with cardiology regularly  Left hip pain We discussed possible etiologies, including OA,tendinitis,radicular.  I do not think imaging is needed at this time. She prefers to hold on PT for now. Stretching exercises and topical icy hot recommended.   Return in about 6 months (around 09/18/2023) for chronic problems.  I, Rolla Etienne Wierda, acting as a scribe for Charmayne Odell [place], MD., have documented all relevant documentation on the behalf of Amanda Clites [place], MD, as directed by  Amanda Kozma [place], MD while in the presence of Amanda Straub [place], MD.   I, Angelyne Terwilliger [place], MD, have reviewed all documentation for this visit. The documentation on 03/20/23 for the exam, diagnosis, procedures, and orders are all accurate and complete.  Amanda Barcelo G. [place], MD  [facility]. [place] office.

## 2023-03-20 NOTE — Assessment & Plan Note (Addendum)
Seen on imaging, most recently on chest CT she had for lung cancer screening, 02/27/23. Results discussed. Has not tolerated statins in the past. Currently on Repatha 140 mg q 2 weeks and Aspirin 81 mg daily.

## 2023-03-20 NOTE — Assessment & Plan Note (Signed)
Currently on amlodipine 5 mg daily, Repatha 140 mg q 2 weeks, and Aspirin 81 mg daily. Last LDL 60 in 10/2022. She follows with cardiology regularly

## 2023-03-22 ENCOUNTER — Other Ambulatory Visit (INDEPENDENT_AMBULATORY_CARE_PROVIDER_SITE_OTHER): Payer: Self-pay | Admitting: Family Medicine

## 2023-03-22 ENCOUNTER — Encounter: Payer: Self-pay | Admitting: Family Medicine

## 2023-03-22 DIAGNOSIS — E559 Vitamin D deficiency, unspecified: Secondary | ICD-10-CM

## 2023-03-27 ENCOUNTER — Other Ambulatory Visit: Payer: Self-pay

## 2023-03-27 DIAGNOSIS — Z122 Encounter for screening for malignant neoplasm of respiratory organs: Secondary | ICD-10-CM

## 2023-03-27 DIAGNOSIS — Z87891 Personal history of nicotine dependence: Secondary | ICD-10-CM

## 2023-03-28 ENCOUNTER — Encounter (INDEPENDENT_AMBULATORY_CARE_PROVIDER_SITE_OTHER): Payer: Self-pay | Admitting: Family Medicine

## 2023-03-28 ENCOUNTER — Ambulatory Visit (INDEPENDENT_AMBULATORY_CARE_PROVIDER_SITE_OTHER): Payer: Medicare HMO | Admitting: Family Medicine

## 2023-03-28 VITALS — BP 132/80 | HR 73 | Temp 98.2°F | Ht 62.0 in | Wt 224.0 lb

## 2023-03-28 DIAGNOSIS — E669 Obesity, unspecified: Secondary | ICD-10-CM | POA: Diagnosis not present

## 2023-03-28 DIAGNOSIS — E559 Vitamin D deficiency, unspecified: Secondary | ICD-10-CM

## 2023-03-28 DIAGNOSIS — Z6841 Body Mass Index (BMI) 40.0 and over, adult: Secondary | ICD-10-CM | POA: Diagnosis not present

## 2023-03-28 DIAGNOSIS — F509 Eating disorder, unspecified: Secondary | ICD-10-CM

## 2023-03-28 MED ORDER — LOMAIRA 8 MG PO TABS
8.0000 mg | ORAL_TABLET | Freq: Every day | ORAL | 0 refills | Status: DC
Start: 2023-03-28 — End: 2023-05-03

## 2023-03-28 MED ORDER — VITAMIN D (ERGOCALCIFEROL) 1.25 MG (50000 UNIT) PO CAPS: 50000.0000 [IU] | ORAL_CAPSULE | ORAL | 0 refills | Status: DC

## 2023-03-28 MED ORDER — TOPIRAMATE 50 MG PO TABS: 50.0000 mg | ORAL_TABLET | Freq: Every day | ORAL | 0 refills | Status: DC

## 2023-03-28 NOTE — Progress Notes (Signed)
   SUBJECTIVE:  Chief Complaint: Obesity  Interim History: Patient had a quite Thanksgiving- a few friends came over and they played games.  She was really in control of food content and quantity. She is having issues with the pharmacy currently about getting her prescriptions filled.  She wants to start incorporating some pasta into her intake.  She has been a stickler for her meal plan.  Amanda Cordova is here to discuss her progress with her obesity treatment plan. She is on the Category 3 Plan and states she is following her eating plan approximately 100 % of the time. She states she is walking 30 minutes 7 times per week.   OBJECTIVE: Visit Diagnoses: Problem List Items Addressed This Visit       Other   Vitamin D deficiency   Last Vitamin D level low 40s and patient previously on rx.  No nausea, vomiting or muscle weakness.  Needs a refill of Vitamin D today.  Needs repeat labs in February.      Relevant Medications   Vitamin D, Ergocalciferol, (DRISDOL) 1.25 MG (50000 UNIT) CAPS capsule   Disorder of eating - Primary   Patient starting weight at 238 on February 06, 2023.  She is now at 224lbs which is a greater than 5% loss (down 14lbs).  She is having a bit of dry mouth from the medication but no other side effects. Will continue at same dose as patient is still having a good amount of food intake control and scale change.      Relevant Medications   topiramate (TOPAMAX) 50 MG tablet   Phentermine HCl (LOMAIRA) 8 MG TABS   Other Visit Diagnoses       Obesity with starting BMI of 44.4       Relevant Medications   Phentermine HCl (LOMAIRA) 8 MG TABS     BMI 40.0-44.9, adult (HCC)       Relevant Medications   Phentermine HCl (LOMAIRA) 8 MG TABS       No data recorded  No data recorded  No data recorded  No data recorded    ASSESSMENT AND PLAN:  Diet: Amanda Cordova is currently in the action stage of change. As such, her goal is to continue with weight loss efforts. She  has agreed to Category 3 Plan.  She will start incorporating a controlled amount of pasta.   Exercise: Amanda Cordova has been instructed that some exercise is better than none for weight loss and overall health benefits.   Behavior Modification:  We discussed the following Behavioral Modification Strategies today: increasing lean protein intake, increasing vegetables, meal planning and cooking strategies, and holiday eating strategies. We discussed various medication options to help Amanda Cordova with her weight loss efforts and we both agreed to stay on same dose of lomaira and topiramate as patient is still losing.  No follow-ups on file.Marland Kitchen She was informed of the importance of frequent follow up visits to maximize her success with intensive lifestyle modifications for her multiple health conditions.  Attestation Statements:   Reviewed by clinician on day of visit: allergies, medications, problem list, medical history, surgical history, family history, social history, and previous encounter notes.    Reuben Likes, MD

## 2023-03-28 NOTE — Assessment & Plan Note (Signed)
Last Vitamin D level low 40s and patient previously on rx.  No nausea, vomiting or muscle weakness.  Needs a refill of Vitamin D today.  Needs repeat labs in February.

## 2023-03-28 NOTE — Assessment & Plan Note (Addendum)
Patient starting weight at 238 on February 06, 2023.  She is now at 224lbs which is a greater than 5% loss (down 14lbs).  She is having a bit of dry mouth from the medication but no other side effects. Will continue at same dose as patient is still having a good amount of food intake control and scale change.

## 2023-05-03 ENCOUNTER — Encounter (INDEPENDENT_AMBULATORY_CARE_PROVIDER_SITE_OTHER): Payer: Self-pay | Admitting: Family Medicine

## 2023-05-03 ENCOUNTER — Ambulatory Visit (INDEPENDENT_AMBULATORY_CARE_PROVIDER_SITE_OTHER): Payer: Medicare HMO | Admitting: Family Medicine

## 2023-05-03 VITALS — BP 113/72 | HR 63 | Temp 97.9°F | Ht 62.0 in | Wt 220.0 lb

## 2023-05-03 DIAGNOSIS — Z6841 Body Mass Index (BMI) 40.0 and over, adult: Secondary | ICD-10-CM

## 2023-05-03 DIAGNOSIS — F509 Eating disorder, unspecified: Secondary | ICD-10-CM | POA: Diagnosis not present

## 2023-05-03 DIAGNOSIS — E559 Vitamin D deficiency, unspecified: Secondary | ICD-10-CM

## 2023-05-03 MED ORDER — TOPIRAMATE 50 MG PO TABS: 75.0000 mg | ORAL_TABLET | Freq: Every day | ORAL | 0 refills | Status: DC

## 2023-05-03 MED ORDER — LOMAIRA 8 MG PO TABS
12.0000 mg | ORAL_TABLET | Freq: Every day | ORAL | 0 refills | Status: DC
Start: 2023-05-03 — End: 2023-05-31

## 2023-05-03 MED ORDER — VITAMIN D (ERGOCALCIFEROL) 1.25 MG (50000 UNIT) PO CAPS: 50000.0000 [IU] | ORAL_CAPSULE | ORAL | 0 refills | Status: AC

## 2023-05-03 NOTE — Progress Notes (Signed)
SUBJECTIVE:  Chief Complaint: Obesity  Interim History: Patient had a good relaxed holiday.  She had company come from [place].  She enjoyed herself.  Quiet New Year's.  She is feeling hungry after she eats and is feeling more cravings.  She has been doing almonds or 100 calorie [employer] wafer cookies.  She wants things like cookies and cakes.  She has been following the recipes on plan and eating all the food on plan. She has had to cut back on her walking due to unsteadiness.  Amanda Cordova is here to discuss her progress with her obesity treatment plan. She is on the Category 3 Plan and states she is following her eating plan approximately 100 % of the time. She states she is exercising 30-45 minutes 7 times per week.   OBJECTIVE: Visit Diagnoses: Problem List Items Addressed This Visit       Other   Obesity, morbid, BMI 40.0-49.9 (HCC)   Initial appointment at clinic in August 2024 at a weight of 241 pounds.  Patient has been making lifestyle changes that include but are not limited to food intake changes and nutrition monitoring ensuring adequate protein intake.  She has been consistently following her meal plan.  She was started on combination medications in October of phentermine and topiramate and has done exceptionally well and has lost more than 5% of her body mass.  She is able to comfortably get in total food goal as well as protein goal even on medications.  Plan is to continue current meal plan and to start making a plan for activity that will be discussed and then enacted at next appointment      Relevant Medications   Phentermine HCl (LOMAIRA) 8 MG TABS   Vitamin D deficiency   Patient has done well on prescription strength vitamin D.  Voices this is easier for her to take than daily supplementation.  Will refill prescription vitamin D today will need repeat labs in February.      Relevant Medications   Vitamin D, Ergocalciferol, (DRISDOL) 1.25 MG (50000 UNIT) CAPS capsule    Disorder of eating - Primary   Patient starting weight at 238 on February 06, 2023.  She is now at 220lbs which is a greater than 5% loss (down 18lbs).  She is having a bit of dry mouth from the medication but no other side effects. Will continue at same dose as patient is still having a good amount of food intake control and scale change.  PDMP was checked with no concerns.  Blood pressure well-controlled.  Patient is feeling very hopeful and motivated with recent weight loss.      Relevant Medications   Phentermine HCl (LOMAIRA) 8 MG TABS   topiramate (TOPAMAX) 50 MG tablet   Other Visit Diagnoses       Obesity with starting BMI of 44.4       Relevant Medications   Phentermine HCl (LOMAIRA) 8 MG TABS       No data recorded No data recorded No data recorded No data recorded   ASSESSMENT AND PLAN:  Diet: Amanda Cordova is currently in the action stage of change. As such, her goal is to continue with weight loss efforts. She has agreed to Category 3 Plan.  Exercise: Amanda Cordova has been instructed that some exercise is better than none for weight loss and overall health benefits.   Behavior Modification:  We discussed the following Behavioral Modification Strategies today: increasing lean protein intake, increasing vegetables, meal planning  and cooking strategies, and planning for success. We discussed various medication options to help Amanda Cordova with her weight loss efforts and we both agreed to continue current medication dosages of phentermine and topiramate at 8 mg and 50 mg respectively..  No follow-ups on file.Marland Kitchen She was informed of the importance of frequent follow up visits to maximize her success with intensive lifestyle modifications for her multiple health conditions.  Attestation Statements:   Reviewed by clinician on day of visit: allergies, medications, problem list, medical history, surgical history, family history, social history, and previous encounter notes.     Reuben Likes, MD

## 2023-05-09 NOTE — Assessment & Plan Note (Signed)
Initial appointment at clinic in August 2024 at a weight of 241 pounds.  Patient has been making lifestyle changes that include but are not limited to food intake changes and nutrition monitoring ensuring adequate protein intake.  She has been consistently following her meal plan.  She was started on combination medications in October of phentermine and topiramate and has done exceptionally well and has lost more than 5% of her body mass.  She is able to comfortably get in total food goal as well as protein goal even on medications.  Plan is to continue current meal plan and to start making a plan for activity that will be discussed and then enacted at next appointment

## 2023-05-09 NOTE — Assessment & Plan Note (Signed)
Patient has done well on prescription strength vitamin D.  Voices this is easier for her to take than daily supplementation.  Will refill prescription vitamin D today will need repeat labs in February.

## 2023-05-09 NOTE — Assessment & Plan Note (Signed)
Patient starting weight at 238 on February 06, 2023.  She is now at 220lbs which is a greater than 5% loss (down 18lbs).  She is having a bit of dry mouth from the medication but no other side effects. Will continue at same dose as patient is still having a good amount of food intake control and scale change.  PDMP was checked with no concerns.  Blood pressure well-controlled.  Patient is feeling very hopeful and motivated with recent weight loss.

## 2023-05-14 DIAGNOSIS — I10 Essential (primary) hypertension: Secondary | ICD-10-CM | POA: Diagnosis not present

## 2023-05-14 DIAGNOSIS — J439 Emphysema, unspecified: Secondary | ICD-10-CM | POA: Diagnosis not present

## 2023-05-14 DIAGNOSIS — I2510 Atherosclerotic heart disease of native coronary artery without angina pectoris: Secondary | ICD-10-CM | POA: Diagnosis not present

## 2023-05-14 DIAGNOSIS — R0602 Shortness of breath: Secondary | ICD-10-CM | POA: Diagnosis not present

## 2023-05-14 DIAGNOSIS — Z131 Encounter for screening for diabetes mellitus: Secondary | ICD-10-CM | POA: Diagnosis not present

## 2023-05-14 DIAGNOSIS — E7800 Pure hypercholesterolemia, unspecified: Secondary | ICD-10-CM | POA: Diagnosis not present

## 2023-05-16 ENCOUNTER — Encounter (INDEPENDENT_AMBULATORY_CARE_PROVIDER_SITE_OTHER): Payer: Self-pay | Admitting: Family Medicine

## 2023-05-18 DIAGNOSIS — J439 Emphysema, unspecified: Secondary | ICD-10-CM | POA: Diagnosis not present

## 2023-05-18 DIAGNOSIS — R0602 Shortness of breath: Secondary | ICD-10-CM | POA: Diagnosis not present

## 2023-05-18 DIAGNOSIS — I10 Essential (primary) hypertension: Secondary | ICD-10-CM | POA: Diagnosis not present

## 2023-05-22 DIAGNOSIS — R059 Cough, unspecified: Secondary | ICD-10-CM | POA: Diagnosis not present

## 2023-05-22 DIAGNOSIS — J018 Other acute sinusitis: Secondary | ICD-10-CM | POA: Diagnosis not present

## 2023-05-26 ENCOUNTER — Ambulatory Visit (INDEPENDENT_AMBULATORY_CARE_PROVIDER_SITE_OTHER): Payer: Medicare HMO

## 2023-05-26 VITALS — Ht 62.0 in | Wt 220.0 lb

## 2023-05-26 DIAGNOSIS — Z0000 Encounter for general adult medical examination without abnormal findings: Secondary | ICD-10-CM

## 2023-05-26 NOTE — Patient Instructions (Addendum)
 [name] , Thank you for taking time to come for your Medicare Wellness Visit. I appreciate your ongoing commitment to your health goals. Please review the following plan we discussed and let me know if I can assist you in the future.   Referrals/Orders/Follow-Ups/Clinician Recommendations:   This is a list of the screening recommended for you and due dates:  Health Maintenance  Topic Date Due   Pneumococcal Vaccination (1 of 2 - PCV) Never done   Hepatitis C Screening  Never done   COVID-19 Vaccine (1) 07/02/2023*   Zoster (Shingles) Vaccine (1 of 2) 09/14/2023*   Pap with HPV screening  03/19/2024*   Mammogram  07/04/2023   Screening for Lung Cancer  02/27/2024   Medicare Annual Wellness Visit  05/25/2024   Colon Cancer Screening  04/18/2026   HIV Screening  Completed   HPV Vaccine  Aged Out   DTaP/Tdap/Td vaccine  Discontinued   Flu Shot  Discontinued  *Topic was postponed. The date shown is not the original due date.    Advanced directives: (Declined) Advance directive discussed with you today. Even though you declined this today, please call our office should you change your mind, and we can give you the proper paperwork for you to fill out.  Next Medicare Annual Wellness Visit scheduled for next year: Yes

## 2023-05-26 NOTE — Progress Notes (Signed)
 Subjective:   Amanda Cordova is a 63 y.o. female who presents for Medicare Annual (Subsequent) preventive examination.  Visit Complete: Virtual I connected with  Ahana Najera Gasparyan on 05/26/23 by a video and audio enabled telemedicine application and verified that I am speaking with the correct person using two identifiers.  Patient Location: Home  Provider Location: Home Office  I discussed the limitations of evaluation and management by telemedicine. The patient expressed understanding and agreed to proceed.  Vital Signs: Because this visit was a virtual/telehealth visit, some criteria may be missing or patient reported. Any vitals not documented were not able to be obtained and vitals that have been documented are patient reported.    Cardiac Risk Factors include: advanced age (>38men, >9 women);hypertension     Objective:    Today's Vitals   05/26/23 0835  Weight: 220 lb (99.8 kg)  Height: 5' 2 (1.575 m)   Body mass index is 40.24 kg/m.     05/26/2023    8:43 AM 09/21/2021    5:45 AM 09/10/2021    8:09 AM 07/21/2021   12:59 PM 07/21/2021   12:58 PM 06/07/2021   11:19 AM 07/22/2020    8:54 AM  Advanced Directives  Does Patient Have a Medical Advance Directive? No No No No Yes No Yes  Type of Tax Inspector;Living will  Would patient like information on creating a medical advance directive? No - Patient declined No - Patient declined         Current Medications (verified) Outpatient Encounter Medications as of 05/26/2023  Medication Sig   albuterol  (VENTOLIN  HFA) 108 (90 Base) MCG/ACT inhaler INHALE 2 PUFFS INTO THE LUNGS EVERY 6 HOURS AS NEEDED FOR WHEEZING OR SHORTNESS OF BREATH   ALPRAZolam  (XANAX ) 0.25 MG tablet TAKE 1 TABLET(0.25 MG) BY MOUTH DAILY AS NEEDED FOR ANXIETY   amLODipine  (NORVASC ) 5 MG tablet Take 1 tablet (5 mg total) by mouth daily.   aspirin  EC 81 MG tablet Take 1 tablet (81 mg total) by mouth daily.  Swallow whole.   furosemide  (LASIX ) 20 MG tablet Take 2 tablets (40 mg total) by mouth daily.   losartan  (COZAAR ) 50 MG tablet TAKE 1 TABLET(50 MG) BY MOUTH DAILY   oxybutynin (DITROPAN-XL) 5 MG 24 hr tablet Take 5 mg by mouth daily.   Phentermine  HCl (LOMAIRA ) 8 MG TABS Take 1.5 tablets (12 mg total) by mouth daily.   REPATHA  SURECLICK 140 MG/ML SOAJ ADMINISTER 1 ML UNDER THE SKIN EVERY 14 DAYS   Respiratory Therapy Supplies (CARETOUCH 2 CPAP HOSE HANGER) MISC    SPIRIVA  RESPIMAT 2.5 MCG/ACT AERS INHALE 2 PUFFS INTO THE LUNGS DAILY.   topiramate  (TOPAMAX ) 50 MG tablet Take 1.5 tablets (75 mg total) by mouth daily.   Vitamin D , Ergocalciferol , (DRISDOL ) 1.25 MG (50000 UNIT) CAPS capsule Take 1 capsule (50,000 Units total) by mouth every 7 (seven) days.   No facility-administered encounter medications on file as of 05/26/2023.    Allergies (verified) Gabapentin, Tramadol , Crestor  [rosuvastatin ], Duloxetine , Lipitor [atorvastatin ], Pravastatin , Zetia  [ezetimibe ], and Lisinopril    History: Past Medical History:  Diagnosis Date   Acoustic neuroma (HCC)    Back pain    CAD (coronary artery disease)    Cancer of kidney (HCC)    Chronic kidney disease    COPD (chronic obstructive pulmonary disease) (HCC)    Dyspnea    Emphysema/COPD (HCC)    Family history of bladder cancer  Family history of breast cancer    Family history of thyroid  cancer    Gallbladder problem    Hyperlipemia    Hypertension    Joint pain    Neuropathy    Optic neuritis    Pre-diabetes    Prediabetes    Rheumatoid arthritis (HCC)    Sleep apnea    wears cpap   SOB (shortness of breath)    Past Surgical History:  Procedure Laterality Date   ABDOMINAL HYSTERECTOMY     ACOUSTIC NEUROMA RESECTION  2011   ANTERIOR CERVICAL DECOMP/DISCECTOMY FUSION N/A 05/28/2020   Procedure: ANTERIOR CERVICAL DECOMPRESSION FUSION CERVICAL THREE-FOUR.;  Surgeon: Carollee Lani BROCKS, DO;  Location: [facility] OR;  Service: Neurosurgery;   Laterality: N/A;  anterior   BREAST EXCISIONAL BIOPSY Right 2000   benign   cerical fusion  1995   CHOLECYSTECTOMY     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   HYSTERECTOMY ABDOMINAL WITH SALPINGECTOMY     MENISCUS REPAIR Left    ROBOTIC ASSITED PARTIAL NEPHRECTOMY Left 09/21/2021   Procedure: XI ROBOTIC ASSITED PARTIAL NEPHRECTOMY;  Surgeon: Devere Lonni Righter, MD;  Location: [facility];  Service: Urology;  Laterality: Left;   Family History  Problem Relation Age of Onset   Hyperlipidemia Mother    Hypertension Mother    COPD Mother    Heart failure Mother    Heart disease Mother    Sleep apnea Mother    Heart disease Father    Hyperlipidemia Father    Hypertension Father    Heart attack Father    Sudden death Father    Breast cancer Sister    Diabetes Sister 9       negative genetic test (VUS in CHEK2 [id number] possibly mosaic)   Breast cancer Sister 23   Breast cancer Sister        dx 63s   Diabetes Maternal Aunt    Kidney cancer Maternal Uncle        dx >50   Bladder Cancer Other        dx 39s (mother's first cousin)   Thyroid  cancer Other        dx 41s (mother's first cousin)   Breast cancer Other    Social History   Socioeconomic History   Marital status: Married    Spouse name: Not on file   Number of children: 4   Years of education: 12   Highest education level: GED or equivalent  Occupational History   Occupation: Disabled  Tobacco Use   Smoking status: Former    Current packs/day: 0.00    Average packs/day: 1 pack/day for 35.0 years (35.0 ttl pk-yrs)    Types: Cigarettes    Start date: 01/10/1983    Quit date: 01/09/2018    Years since quitting: 5.3    Passive exposure: Current   Smokeless tobacco: Never  Vaping Use   Vaping status: Never Used  Substance and Sexual Activity   Alcohol use: Not Currently   Drug use: Never   Sexual activity: Yes  Other Topics Concern   Not on file  Social History Narrative   Lives at home with her  husband and her great-grandson.   Right-handed.   2-3 cups caffeine per day.    Social Drivers of Corporate Investment Banker Strain: Low Risk  (05/26/2023)   Overall Financial Resource Strain (CARDIA)    Difficulty of Paying Living Expenses: Not hard at all  Recent Concern: Physicist, Medical Strain -  Medium Risk (05/25/2023)   Overall Financial Resource Strain (CARDIA)    Difficulty of Paying Living Expenses: Somewhat hard  Food Insecurity: No Food Insecurity (05/26/2023)   Hunger Vital Sign    Worried About Running Out of Food in the Last Year: Never true    Ran Out of Food in the Last Year: Never true  Recent Concern: Food Insecurity - Food Insecurity Present (03/13/2023)   Hunger Vital Sign    Worried About Running Out of Food in the Last Year: Never true    Ran Out of Food in the Last Year: Sometimes true  Transportation Needs: No Transportation Needs (05/26/2023)   PRAPARE - Administrator, Civil Service (Medical): No    Lack of Transportation (Non-Medical): No  Physical Activity: Sufficiently Active (05/26/2023)   Exercise Vital Sign    Days of Exercise per Week: 7 days    Minutes of Exercise per Session: 60 min  Stress: No Stress Concern Present (05/26/2023)   [employer] of Occupational Health - Occupational Stress Questionnaire    Feeling of Stress : Not at all  Social Connections: Moderately Isolated (05/26/2023)   Social Connection and Isolation Panel [NHANES]    Frequency of Communication with Friends and Family: More than three times a week    Frequency of Social Gatherings with Friends and Family: More than three times a week    Attends Religious Services: Never    Database Administrator or Organizations: No    Attends Engineer, Structural: Never    Marital Status: Married    Tobacco Counseling Counseling given: Not Answered   Clinical Intake:  Pre-visit preparation completed: Yes  Pain : No/denies pain     BMI - recorded:  40.24 Nutritional Status: BMI > 30  Obese Nutritional Risks: None Diabetes: No  How often do you need to have someone help you when you read instructions, pamphlets, or other written materials from your doctor or pharmacy?: 1 - Never  Interpreter Needed?: No  Information entered by :: Rojelio Blush LPN   Activities of Daily Living    05/26/2023    8:41 AM  In your present state of health, do you have any difficulty performing the following activities:  Hearing? 0  Vision? 0  Difficulty concentrating or making decisions? 0  Walking or climbing stairs? 0  Dressing or bathing? 0  Doing errands, shopping? 0  Preparing Food and eating ? N  Using the Toilet? N  In the past six months, have you accidently leaked urine? N  Do you have problems with loss of bowel control? N  Managing your Medications? N  Managing your Finances? N  Housekeeping or managing your Housekeeping? N    Patient Care Team: Jordan, [name], MD as PCP - General (Family Medicine) Pietro Redell RAMAN, MD as PCP - Cardiology (Cardiology) Verta Royden DASEN, DPM as Consulting Physician (Podiatry) Liane Sharyne MATSU, [facility] (Inactive) as Pharmacist (Pharmacist)  Indicate any recent Medical Services you may have received from other than [facility] providers in the past year (date may be approximate).     Assessment:   This is a routine wellness examination for Lexey.  Hearing/Vision screen Hearing Screening - Comments:: Denies hearing difficulties   Vision Screening - Comments:: Wears rx glasses - up to date with routine eye exams with  [facility]   Goals Addressed               This Visit's Progress  Remain Active (pt-stated)        Lose weight and gain balance.       Depression Screen    05/26/2023    8:39 AM 09/14/2022    7:07 AM 07/01/2022    7:19 AM 06/16/2022   12:39 PM 06/16/2022   12:38 PM 04/04/2022    7:16 AM 01/07/2022   11:32 AM  PHQ 2/9 Scores  PHQ - 2 Score 0 0 0 0 0 0 0  PHQ- 9 Score  1 2 3   3 3     Fall Risk    05/26/2023    8:41 AM 09/14/2022    7:07 AM 07/01/2022    7:19 AM 06/16/2022   12:37 PM 04/04/2022    7:16 AM  Fall Risk   Falls in the past year? 1 1 0 1 1  Number falls in past yr: 0 0 0 1 0  Comment    per patient has a brain tumor, balance is unsteady at times.   Injury with Fall? 0 0 0 0 0  Risk for fall due to : No Fall Risks History of fall(s) Other (Comment)  History of fall(s)  Follow up Falls prevention discussed Falls evaluation completed Falls evaluation completed  Falls evaluation completed    MEDICARE RISK AT HOME: Medicare Risk at Home Any stairs in or around the home?: Yes If so, are there any without handrails?: No Home free of loose throw rugs in walkways, pet beds, electrical cords, etc?: Yes Adequate lighting in your home to reduce risk of falls?: Yes Life alert?: No Use of a cane, walker or w/c?: No Grab bars in the bathroom?: No Shower chair or bench in shower?: No Elevated toilet seat or a handicapped toilet?: No  TIMED UP AND GO:  Was the test performed?  No    Cognitive Function:        05/26/2023    8:43 AM 06/07/2021   11:23 AM  6CIT Screen  What Year? 0 points 0 points  What month? 0 points 0 points  What time? 0 points 0 points  Count back from 20 0 points 0 points  Months in reverse 0 points 0 points  Repeat phrase 0 points 4 points  Total Score 0 points 4 points    Immunizations Immunization History  Administered Date(s) Administered   Influenza Split 02/02/2001, 03/06/2012, 12/11/2013   Influenza-Unspecified 12/05/2012   Td 09/06/2005        Pneumococcal vaccine status: Due, Education has been provided regarding the importance of this vaccine. Advised may receive this vaccine at local pharmacy or Health Dept. Aware to provide a copy of the vaccination record if obtained from local pharmacy or Health Dept. Verbalized acceptance and understanding.  Covid-19 vaccine status: Declined, Education has been provided  regarding the importance of this vaccine but patient still declined. Advised may receive this vaccine at local pharmacy or Health Dept.or vaccine clinic. Aware to provide a copy of the vaccination record if obtained from local pharmacy or Health Dept. Verbalized acceptance and understanding.  Qualifies for Shingles Vaccine? Yes   Zostavax completed No   Shingrix Completed?: No.    Education has been provided regarding the importance of this vaccine. Patient has been advised to call insurance company to determine out of pocket expense if they have not yet received this vaccine. Advised may also receive vaccine at local pharmacy or Health Dept. Verbalized acceptance and understanding.  Screening Tests Health Maintenance  Topic Date Due  Pneumococcal Vaccine 74-80 Years old (1 of 2 - PCV) Never done   Hepatitis C Screening  Never done   COVID-19 Vaccine (1) 07/02/2023 (Originally 01/06/1966)   Zoster Vaccines- Shingrix (1 of 2) 09/14/2023 (Originally 01/05/1978)   Cervical Cancer Screening (HPV/Pap Cotest)  03/19/2024 (Originally 01/05/1989)   MAMMOGRAM  07/04/2023   Lung Cancer Screening  02/27/2024   Medicare Annual Wellness (AWV)  05/25/2024   Colonoscopy  04/18/2026   HIV Screening  Completed   HPV VACCINES  Aged Out   DTaP/Tdap/Td  Discontinued   INFLUENZA VACCINE  Discontinued    Health Maintenance  Health Maintenance Due  Topic Date Due   Pneumococcal Vaccine 58-6 Years old (1 of 2 - PCV) Never done   Hepatitis C Screening  Never done    Colorectal cancer screening: Type of screening: Colonoscopy. Completed 04/18/16. Repeat every 10 years  Mammogram status: Completed 07/04/22. Repeat every year       Additional Screening:  Hepatitis C Screening: does qualify;  Deferred  Vision Screening: Recommended annual ophthalmology exams for early detection of glaucoma and other disorders of the eye. Is the patient up to date with their annual eye exam?  Yes  Who is the provider or  what is the name of the office in which the patient attends annual eye exams? [facility] If pt is not established with a provider, would they like to be referred to a provider to establish care? No .   Dental Screening: Recommended annual dental exams for proper oral hygiene    Community Resource Referral / Chronic Care Management:  CRR required this visit?  No   CCM required this visit?  No     Plan:     I have personally reviewed and noted the following in the patient's chart:   Medical and social history Use of alcohol, tobacco or illicit drugs  Current medications and supplements including opioid prescriptions. Patient is not currently taking opioid prescriptions. Functional ability and status Nutritional status Physical activity Advanced directives List of other physicians Hospitalizations, surgeries, and ER visits in previous 12 months Vitals Screenings to include cognitive, depression, and falls Referrals and appointments  In addition, I have reviewed and discussed with patient certain preventive protocols, quality metrics, and best practice recommendations. A written personalized care plan for preventive services as well as general preventive health recommendations were provided to patient.     Rojelio LELON Blush, LPN   10/17/7972   After Visit Summary: (MyChart) Due to this being a telephonic visit, the after visit summary with patients personalized plan was offered to patient via MyChart   Nurse Notes: None

## 2023-05-31 ENCOUNTER — Encounter (INDEPENDENT_AMBULATORY_CARE_PROVIDER_SITE_OTHER): Payer: Self-pay | Admitting: Family Medicine

## 2023-05-31 ENCOUNTER — Ambulatory Visit (INDEPENDENT_AMBULATORY_CARE_PROVIDER_SITE_OTHER): Payer: Medicare HMO | Admitting: Family Medicine

## 2023-05-31 VITALS — BP 114/73 | HR 78 | Temp 98.3°F | Ht 62.0 in | Wt 213.0 lb

## 2023-05-31 DIAGNOSIS — F509 Eating disorder, unspecified: Secondary | ICD-10-CM

## 2023-05-31 DIAGNOSIS — Z6838 Body mass index (BMI) 38.0-38.9, adult: Secondary | ICD-10-CM

## 2023-05-31 MED ORDER — TOPIRAMATE 50 MG PO TABS: 75.0000 mg | ORAL_TABLET | Freq: Every day | ORAL | 0 refills | Status: DC

## 2023-05-31 MED ORDER — LOMAIRA 8 MG PO TABS: 12.0000 mg | ORAL_TABLET | Freq: Every day | ORAL | 0 refills | Status: DC

## 2023-05-31 NOTE — Progress Notes (Signed)
   SUBJECTIVE:  Chief Complaint: Obesity  Interim History: Patient has been getting over an URI which she required steroid and antibiotics to improve.  She is dealing with mold in a crawl space getting taken care of. She is sticking with the Category plan strictly.  She is monitoring her portions. She has only been taking in 4oz at lunch and supper.  She is cooking all food at home.    Amanda Cordova is here to discuss her progress with her obesity treatment plan. She is on the Category 3 Plan and states she is following her eating plan approximately 100 % of the time. She states she is exercising 27 minutes 2-3 times per week.  She signed up for silver sneakers and is planning to do aqua yoga as the weather warms.   OBJECTIVE: Visit Diagnoses: Problem List Items Addressed This Visit       Other   Obesity, morbid, BMI 40.0-49.9 (HCC)   tStarting weight: 243 Peak weight: 243 BMR: 1915 Previous obesity management: started on combination medication of phentermine and topiramate on 02/06/23 Body Fat %: 48.2% Starting Meal Plan: category 3       Relevant Medications   Phentermine HCl (LOMAIRA) 8 MG TABS   Disorder of eating - Primary   Patient starting weight at 238 on February 06, 2023; currently at 213- greater than 5% loss (down 18lbs).  PDMP was checked with no concerns.  Blood pressure well-controlled.  No side effects mentioned by patient.  Refills sent in today.      Relevant Medications   Phentermine HCl (LOMAIRA) 8 MG TABS   topiramate (TOPAMAX) 50 MG tablet   Other Visit Diagnoses       Obesity with starting BMI of 44.4       Relevant Medications   Phentermine HCl (LOMAIRA) 8 MG TABS     BMI 38.0-38.9,adult          Vitals:   05/31/23 0900  BP: 114/73  Pulse: 78  Temp: 98.3 F (36.8 C)  SpO2: 99%    No data recorded  No data recorded  No data recorded  No data recorded    ASSESSMENT AND PLAN:  Diet: Amanda Cordova is currently in the action stage of change.  As such, her goal is to continue with weight loss efforts. She has agreed to Category 3 Plan.  Exercise: Amanda Cordova has been instructed that some exercise is better than none and to continue exercising as is for weight loss and overall health benefits.   Behavior Modification:  We discussed the following Behavioral Modification Strategies today: increasing lean protein intake, increasing vegetables, meal planning and cooking strategies, better snacking choices, and planning for success. We discussed various medication options to help Amanda Cordova with her weight loss efforts and we both agreed to continue current dose of phentermine and topiramate.  Return in about 4 weeks (around 06/28/2023) for fasting labs.. She was informed of the importance of frequent follow up visits to maximize her success with intensive lifestyle modifications for her multiple health conditions.  Attestation Statements:   Reviewed by clinician on day of visit: allergies, medications, problem list, medical history, surgical history, family history, social history, and previous encounter notes.      Reuben Likes, MD

## 2023-05-31 NOTE — Assessment & Plan Note (Addendum)
 tStarting weight: 243 Peak weight: 243 BMR: 1915 Previous obesity management: started on combination medication of phentermine and topiramate on 02/06/23 Body Fat %: 48.2% Starting Meal Plan: category 3

## 2023-05-31 NOTE — Assessment & Plan Note (Signed)
Patient starting weight at 238 on February 06, 2023; currently at 213- greater than 5% loss (down 18lbs).  PDMP was checked with no concerns.  Blood pressure well-controlled.  No side effects mentioned by patient.  Refills sent in today.

## 2023-06-07 ENCOUNTER — Ambulatory Visit: Payer: Medicare HMO | Admitting: Nurse Practitioner

## 2023-06-08 DIAGNOSIS — R059 Cough, unspecified: Secondary | ICD-10-CM | POA: Diagnosis not present

## 2023-06-08 DIAGNOSIS — Z87891 Personal history of nicotine dependence: Secondary | ICD-10-CM | POA: Diagnosis not present

## 2023-06-08 DIAGNOSIS — R0602 Shortness of breath: Secondary | ICD-10-CM | POA: Diagnosis not present

## 2023-06-13 DIAGNOSIS — I2510 Atherosclerotic heart disease of native coronary artery without angina pectoris: Secondary | ICD-10-CM | POA: Diagnosis not present

## 2023-06-13 DIAGNOSIS — R0602 Shortness of breath: Secondary | ICD-10-CM | POA: Diagnosis not present

## 2023-06-13 DIAGNOSIS — J439 Emphysema, unspecified: Secondary | ICD-10-CM | POA: Diagnosis not present

## 2023-06-15 DIAGNOSIS — Z87891 Personal history of nicotine dependence: Secondary | ICD-10-CM | POA: Diagnosis not present

## 2023-06-21 ENCOUNTER — Ambulatory Visit: Payer: Medicare HMO | Admitting: Cardiology

## 2023-06-22 DIAGNOSIS — R0609 Other forms of dyspnea: Secondary | ICD-10-CM | POA: Diagnosis not present

## 2023-06-22 DIAGNOSIS — E669 Obesity, unspecified: Secondary | ICD-10-CM | POA: Diagnosis not present

## 2023-06-22 DIAGNOSIS — G4733 Obstructive sleep apnea (adult) (pediatric): Secondary | ICD-10-CM | POA: Diagnosis not present

## 2023-06-29 ENCOUNTER — Ambulatory Visit (INDEPENDENT_AMBULATORY_CARE_PROVIDER_SITE_OTHER): Payer: Medicare HMO | Admitting: Family Medicine

## 2023-06-29 ENCOUNTER — Encounter (INDEPENDENT_AMBULATORY_CARE_PROVIDER_SITE_OTHER): Payer: Self-pay | Admitting: Family Medicine

## 2023-06-29 VITALS — BP 114/71 | HR 82 | Temp 98.3°F | Ht 62.0 in | Wt 210.0 lb

## 2023-06-29 DIAGNOSIS — E559 Vitamin D deficiency, unspecified: Secondary | ICD-10-CM

## 2023-06-29 DIAGNOSIS — R739 Hyperglycemia, unspecified: Secondary | ICD-10-CM

## 2023-06-29 DIAGNOSIS — F509 Eating disorder, unspecified: Secondary | ICD-10-CM | POA: Diagnosis not present

## 2023-06-29 DIAGNOSIS — Z6838 Body mass index (BMI) 38.0-38.9, adult: Secondary | ICD-10-CM

## 2023-06-29 DIAGNOSIS — Z0000 Encounter for general adult medical examination without abnormal findings: Secondary | ICD-10-CM

## 2023-06-29 MED ORDER — LOMAIRA 8 MG PO TABS: ORAL_TABLET | ORAL | 0 refills | Status: DC

## 2023-06-29 MED ORDER — TOPIRAMATE 50 MG PO TABS: ORAL_TABLET | ORAL | 0 refills | Status: DC

## 2023-06-29 NOTE — Progress Notes (Unsigned)
 SUBJECTIVE:  Chief Complaint: Obesity  Interim History: Patient doing well on meal plan- she is not getting in as much meat as she was previously.  She is moving more with the change in weather.  She is planning the next  few weeks to concentrate more on getting her house up to par.  She has also noticed a body odor that she states is coming from her medication and she cannot tolerate this.   Amanda Cordova is here to discuss her progress with her obesity treatment plan. She is on the Category 3 Plan and states she is following her eating plan approximately 50 % of the time. She states she is walking everyday and using vibrating plate 10 minutes 3 times per week.   OBJECTIVE: Visit Diagnoses: Problem List Items Addressed This Visit       Other   Obesity, morbid, BMI 40.0-49.9 (HCC)   Anthropometric Measurements Height: 5\' 2"  (1.575 m) Weight: 210 lb (95.3 kg) BMI (Calculated): 38.4 Weight at Last Visit: 213 lb Weight Lost Since Last Visit: 3 Weight Gained Since Last Visit: 0 Starting Weight: 243 lb Total Weight Loss (lbs): 33 lb (15 kg) Body Composition  Body Fat %: 47.8 % Fat Mass (lbs): 100.4 lbs Muscle Mass (lbs): 104 lbs Total Body Water (lbs): 76.8 lbs Visceral Fat Rating : 15 Other Clinical Data Fasting: yes Labs: yes Today's Visit #: 10 Starting Date: 10/19/22 Comments: Cat 3       Relevant Medications   Phentermine HCl (LOMAIRA) 8 MG TABS   Vitamin D deficiency   Repeating Vitamin D level to assess response to supplementation      Relevant Orders   VITAMIN D 25 Hydroxy (Vit-D Deficiency, Fractures) (Completed)   Preventative health care   Patient needs Hep C antibody testing done today as she is between PCPs currently.      Relevant Orders   Hepatitis C Antibody (Completed)   Disorder of eating - Primary   On combination of phentermine and topiramate but is experiencing increase in body odor. Patient starting weight at 238 on February 06, 2023; currently at  210- greater than 5% loss (down 28lbs).  PDMP was checked with no concerns.  Blood pressure well-controlled.  Patient requests medication cessation so taper given to stop medication over the next 15 days.      Relevant Medications   Phentermine HCl (LOMAIRA) 8 MG TABS   topiramate (TOPAMAX) 50 MG tablet   Hyperglycemia   Patient has been working on dietary intake and monitoring macronutrient composition ratios.  Needs repeat CMP, A1c and Insulin level today.  Will discuss labs at next appointment.      Relevant Orders   Comprehensive metabolic panel (Completed)   Insulin, random (Completed)   Hemoglobin A1c (Completed)   Other Visit Diagnoses       BMI 38.0-38.9,adult         Obesity with starting BMI of 44.4       Relevant Medications   Phentermine HCl (LOMAIRA) 8 MG TABS       No data recorded      06/29/2023    9:00 AM 05/31/2023    9:00 AM 05/26/2023    8:35 AM  Vitals with BMI  Height 5\' 2"  5\' 2"  5\' 2"   Weight 210 lbs 213 lbs 220 lbs  BMI 38.4 38.95 40.23  Systolic 114 114 --  Diastolic 71 73 --  Pulse 82 78        ASSESSMENT AND PLAN:  Diet: Chantell is currently in the action stage of change. As such, her goal is to continue with weight loss efforts and has agreed to the Category 3 Plan.   Exercise:  For substantial health benefits, adults should do at least 150 minutes (2 hours and 30 minutes) a week of moderate-intensity, or 75 minutes (1 hour and 15 minutes) a week of vigorous-intensity aerobic physical activity, or an equivalent combination of moderate- and vigorous-intensity aerobic activity. Aerobic activity should be performed in episodes of at least 10 minutes, and preferably, it should be spread throughout the week.  Behavior Modification:  We discussed the following Behavioral Modification Strategies today: increasing lean protein intake, meal planning and cooking strategies, better snacking choices, and planning for success. We discussed various  medication options to help Anzal with her weight loss efforts and we both agreed to taper off her lomaira and topiramate to 8mg /50mg  for 4 days then 4mg /25mg  daily for 4 days then off.  No follow-ups on file.Marland Kitchen She was informed of the importance of frequent follow up visits to maximize her success with intensive lifestyle modifications for her multiple health conditions.  Attestation Statements:   Reviewed by clinician on day of visit: allergies, medications, problem list, medical history, surgical history, family history, social history, and previous encounter notes.     Reuben Likes, MD

## 2023-06-30 LAB — COMPREHENSIVE METABOLIC PANEL
ALT: 7 IU/L (ref 0–32)
AST: 16 IU/L (ref 0–40)
Albumin: 4.4 g/dL (ref 3.9–4.9)
Alkaline Phosphatase: 107 IU/L (ref 44–121)
BUN/Creatinine Ratio: 18 (ref 12–28)
BUN: 17 mg/dL (ref 8–27)
Bilirubin Total: 0.5 mg/dL (ref 0.0–1.2)
CO2: 22 mmol/L (ref 20–29)
Calcium: 9.7 mg/dL (ref 8.7–10.3)
Chloride: 107 mmol/L — ABNORMAL HIGH (ref 96–106)
Creatinine, Ser: 0.94 mg/dL (ref 0.57–1.00)
Globulin, Total: 2.8 g/dL (ref 1.5–4.5)
Glucose: 115 mg/dL — ABNORMAL HIGH (ref 70–99)
Potassium: 3.7 mmol/L (ref 3.5–5.2)
Sodium: 145 mmol/L — ABNORMAL HIGH (ref 134–144)
Total Protein: 7.2 g/dL (ref 6.0–8.5)
eGFR: 68 mL/min/{1.73_m2} (ref >59)

## 2023-06-30 LAB — INSULIN, RANDOM: INSULIN: 32.4 u[IU]/mL — ABNORMAL HIGH (ref 2.6–24.9)

## 2023-06-30 LAB — HEMOGLOBIN A1C
Est. average glucose Bld gHb Est-mCnc: 111 mg/dL
Hgb A1c MFr Bld: 5.5 % (ref 4.8–5.6)

## 2023-06-30 LAB — HEPATITIS C ANTIBODY: Hep C Virus Ab: NONREACTIVE

## 2023-06-30 LAB — VITAMIN D 25 HYDROXY (VIT D DEFICIENCY, FRACTURES): Vit D, 25-Hydroxy: 63 ng/mL (ref 30.0–100.0)

## 2023-07-06 DIAGNOSIS — R739 Hyperglycemia, unspecified: Secondary | ICD-10-CM | POA: Insufficient documentation

## 2023-07-06 NOTE — Assessment & Plan Note (Signed)
 Patient needs Hep C antibody testing done today as she is between PCPs currently.

## 2023-07-06 NOTE — Assessment & Plan Note (Signed)
 On combination of phentermine and topiramate but is experiencing increase in body odor. Patient starting weight at 238 on February 06, 2023; currently at 210- greater than 5% loss (down 28lbs).  PDMP was checked with no concerns.  Blood pressure well-controlled.  Patient requests medication cessation so taper given to stop medication over the next 15 days.

## 2023-07-06 NOTE — Assessment & Plan Note (Signed)
 Patient has been working on dietary intake and monitoring macronutrient composition ratios.  Needs repeat CMP, A1c and Insulin level today.  Will discuss labs at next appointment.

## 2023-07-06 NOTE — Assessment & Plan Note (Signed)
 Anthropometric Measurements Height: 5\' 2"  (1.575 m) Weight: 210 lb (95.3 kg) BMI (Calculated): 38.4 Weight at Last Visit: 213 lb Weight Lost Since Last Visit: 3 Weight Gained Since Last Visit: 0 Starting Weight: 243 lb Total Weight Loss (lbs): 33 lb (15 kg) Body Composition  Body Fat %: 47.8 % Fat Mass (lbs): 100.4 lbs Muscle Mass (lbs): 104 lbs Total Body Water (lbs): 76.8 lbs Visceral Fat Rating : 15 Other Clinical Data Fasting: yes Labs: yes Today's Visit #: 10 Starting Date: 10/19/22 Comments: Cat 3

## 2023-07-07 NOTE — Assessment & Plan Note (Signed)
 Repeating Vitamin D level to assess response to supplementation

## 2023-07-27 ENCOUNTER — Ambulatory Visit (INDEPENDENT_AMBULATORY_CARE_PROVIDER_SITE_OTHER): Admitting: Family Medicine

## 2023-07-27 ENCOUNTER — Encounter (INDEPENDENT_AMBULATORY_CARE_PROVIDER_SITE_OTHER): Payer: Self-pay | Admitting: Family Medicine

## 2023-07-27 VITALS — BP 137/82 | HR 78 | Temp 98.2°F | Ht 62.0 in | Wt 217.0 lb

## 2023-07-27 DIAGNOSIS — F419 Anxiety disorder, unspecified: Secondary | ICD-10-CM

## 2023-07-27 DIAGNOSIS — Z6839 Body mass index (BMI) 39.0-39.9, adult: Secondary | ICD-10-CM

## 2023-07-27 DIAGNOSIS — E669 Obesity, unspecified: Secondary | ICD-10-CM | POA: Diagnosis not present

## 2023-07-27 DIAGNOSIS — I10 Essential (primary) hypertension: Secondary | ICD-10-CM

## 2023-07-27 NOTE — Assessment & Plan Note (Signed)
 Being treated with low dose xanax taken as needed for her anxiety.  She is upset today about the issue that is going on with her insurance.  Significant lag between prescribing medication and patient able to get it filled.  Cannot view notes from PCP or pulmonary from outside of the system.

## 2023-07-27 NOTE — Progress Notes (Signed)
 SUBJECTIVE:  Chief Complaint: Obesity  Interim History: Patient is dealing with a big situation with her insurance currently.  She is not clear as to what has changed and her prescriptions are being delivered to her in plastic bags.  She is starting back on the Category 3.  She hasn't started back up at the [facility] as she is waiting for the schedule to come out.  Food is in the house for the Category 3.  She is upset today about the current issue with her insurance.  Necha is here to discuss her progress with her obesity treatment plan. She is on the Category 3 Plan and states she is following her eating plan approximately 50-60 % of the time. She states she is walking the dog every day for 45 minutes.   OBJECTIVE: Visit Diagnoses: Problem List Items Addressed This Visit       Cardiovascular and Mediastinum   Hypertension - Primary   Medication change recently- now on amlodipine- olmesartan daily.  Blood pressure slightly higher today than it was at last appointment.  No chest pain, chest pressure or headache.  Med list updated.      Relevant Medications   amlodipine-olmesartan (AZOR) 10-20 MG tablet     Other   Anxiety   Being treated with low dose xanax taken as needed for her anxiety.  She is upset today about the issue that is going on with her insurance.  Significant lag between prescribing medication and patient able to get it filled.  Cannot view notes from PCP or pulmonary from outside of the system.      Other Visit Diagnoses       Obesity with starting BMI of 44.4         BMI 39.0-39.9,adult           Vitals Temp: 98.2 F (36.8 C) BP: 137/82 Pulse Rate: 78 SpO2: 99 %   Anthropometric Measurements Height: 5\' 2"  (1.575 m) Weight: 217 lb (98.4 kg) BMI (Calculated): 39.68 Weight at Last Visit: 210 lb Weight Lost Since Last Visit: 0 Weight Gained Since Last Visit: 7 Starting Weight: 243 lb Total Weight Loss (lbs): 26 lb (11.8 kg)   Body Composition   Body Fat %: 48.4 % Fat Mass (lbs): 105.4 lbs Muscle Mass (lbs): 106.6 lbs Total Body Water (lbs): 81.2 lbs Visceral Fat Rating : 16   Other Clinical Data Today's Visit #: 11 Starting Date: 10/19/22 Comments: Cat 3     ASSESSMENT AND PLAN:  Diet: Makenna is currently in the action stage of change. As such, her goal is to continue with weight loss efforts and has agreed to the Category 3 Plan.   Exercise:  For substantial health benefits, adults should do at least 150 minutes (2 hours and 30 minutes) a week of moderate-intensity, or 75 minutes (1 hour and 15 minutes) a week of vigorous-intensity aerobic physical activity, or an equivalent combination of moderate- and vigorous-intensity aerobic activity. Aerobic activity should be performed in episodes of at least 10 minutes, and preferably, it should be spread throughout the week.  Behavior Modification:  We discussed the following Behavioral Modification Strategies today: increasing lean protein intake, no skipping meals, meal planning and cooking strategies, avoiding temptations, and planning for success.   Return in about 4 weeks (around 08/24/2023).Marland Kitchen She was informed of the importance of frequent follow up visits to maximize her success with intensive lifestyle modifications for her multiple health conditions.  Attestation Statements:   Reviewed by Facilities manager on  day of visit: allergies, medications, problem list, medical history, surgical history, family history, social history, and previous encounter notes.   Reuben Likes, MD

## 2023-07-27 NOTE — Assessment & Plan Note (Signed)
 Medication change recently- now on amlodipine- olmesartan daily.  Blood pressure slightly higher today than it was at last appointment.  No chest pain, chest pressure or headache.  Med list updated.

## 2023-08-01 NOTE — Progress Notes (Signed)
 HPI: FU CAD. Previously had chest CT for lung cancer screening. Noted to have aortic atherosclerosis and coronary artery calcification. COPD also noted. Nuclear study September 2022 showed ejection fraction 58% with no ischemia or infarction.  Echo 8/24 showed normal LV function. Since last seen, she has some dyspnea on exertion unchanged.  She denies chest pain.  No orthopnea, PND.  Occasional mild edema in the right lower extremity.  Current Outpatient Medications  Medication Sig Dispense Refill   albuterol  (VENTOLIN  HFA) 108 (90 Base) MCG/ACT inhaler INHALE 2 PUFFS INTO THE LUNGS EVERY 6 HOURS AS NEEDED FOR WHEEZING OR SHORTNESS OF BREATH 18 g 2   ALPRAZolam  (XANAX ) 0.25 MG tablet TAKE 1 TABLET(0.25 MG) BY MOUTH DAILY AS NEEDED FOR ANXIETY 30 tablet 3   amlodipine -olmesartan (AZOR) 10-20 MG tablet Take 1 tablet by mouth daily.     aspirin  EC 81 MG tablet Take 1 tablet (81 mg total) by mouth daily. Swallow whole. 90 tablet 3   furosemide  (LASIX ) 20 MG tablet Take 2 tablets (40 mg total) by mouth daily. 180 tablet 3   REPATHA  SURECLICK 140 MG/ML SOAJ ADMINISTER 1 ML UNDER THE SKIN EVERY 14 DAYS 2 mL 11   Respiratory Therapy Supplies (CARETOUCH 2 CPAP HOSE HANGER) MISC      Vitamin D , Ergocalciferol , (DRISDOL ) 1.25 MG (50000 UNIT) CAPS capsule Take 1 capsule (50,000 Units total) by mouth every 7 (seven) days. 12 capsule 0   No current facility-administered medications for this visit.     Past Medical History:  Diagnosis Date   Acoustic neuroma (HCC)    Back pain    CAD (coronary artery disease)    Cancer of kidney (HCC)    Chronic kidney disease    COPD (chronic obstructive pulmonary disease) (HCC)    Dyspnea    Emphysema/COPD (HCC)    Family history of bladder cancer    Family history of breast cancer    Family history of thyroid  cancer    Gallbladder problem    Hyperlipemia    Hypertension    Joint pain    Neuropathy    Optic neuritis    Pre-diabetes    Prediabetes     Rheumatoid arthritis (HCC)    Sleep apnea    wears cpap   SOB (shortness of breath)     Past Surgical History:  Procedure Laterality Date   ABDOMINAL HYSTERECTOMY     ACOUSTIC NEUROMA RESECTION  2011   ANTERIOR CERVICAL DECOMP/DISCECTOMY FUSION N/A 05/28/2020   Procedure: ANTERIOR CERVICAL DECOMPRESSION FUSION CERVICAL THREE-FOUR.;  Surgeon: Pincus Bridgeman, DO;  Location: [facility] OR;  Service: Neurosurgery;  Laterality: N/A;  anterior   BREAST EXCISIONAL BIOPSY Right 2000   benign   cerical fusion  1995   CHOLECYSTECTOMY     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   HYSTERECTOMY ABDOMINAL WITH SALPINGECTOMY     MENISCUS REPAIR Left    ROBOTIC ASSITED PARTIAL NEPHRECTOMY Left 09/21/2021   Procedure: XI ROBOTIC ASSITED PARTIAL NEPHRECTOMY;  Surgeon: Adelbert Homans, MD;  Location: [facility] ORS;  Service: Urology;  Laterality: Left;    Social History   Socioeconomic History   Marital status: Married    Spouse name: Not on file   Number of children: 4   Years of education: 12   Highest education level: GED or equivalent  Occupational History   Occupation: Disabled  Tobacco Use   Smoking status: Former    Current packs/day: 0.00    Average  packs/day: 1 pack/day for 35.0 years (35.0 ttl pk-yrs)    Types: Cigarettes    Start date: 01/10/1983    Quit date: 01/09/2018    Years since quitting: 5.5    Passive exposure: Current   Smokeless tobacco: Never  Vaping Use   Vaping status: Never Used  Substance and Sexual Activity   Alcohol use: Not Currently   Drug use: Never   Sexual activity: Yes  Other Topics Concern   Not on file  Social History Narrative   Lives at home with her husband and her great-grandson.   Right-handed.   2-3 cups caffeine per day.    Social Drivers of Corporate investment banker Strain: Low Risk  (05/26/2023)   Overall Financial Resource Strain (CARDIA)    Difficulty of Paying Living Expenses: Not hard at all  Recent Concern: Financial Resource  Strain - Medium Risk (05/25/2023)   Overall Financial Resource Strain (CARDIA)    Difficulty of Paying Living Expenses: Somewhat hard  Food Insecurity: No Food Insecurity (05/26/2023)   Hunger Vital Sign    Worried About Running Out of Food in the Last Year: Never true    Ran Out of Food in the Last Year: Never true  Recent Concern: Food Insecurity - Food Insecurity Present (03/13/2023)   Hunger Vital Sign    Worried About Running Out of Food in the Last Year: Never true    Ran Out of Food in the Last Year: Sometimes true  Transportation Needs: No Transportation Needs (05/26/2023)   PRAPARE - Administrator, Civil Service (Medical): No    Lack of Transportation (Non-Medical): No  Physical Activity: Sufficiently Active (05/26/2023)   Exercise Vital Sign    Days of Exercise per Week: 7 days    Minutes of Exercise per Session: 60 min  Stress: No Stress Concern Present (05/26/2023)   [employer] of Occupational Health - Occupational Stress Questionnaire    Feeling of Stress : Not at all  Social Connections: Moderately Isolated (05/26/2023)   Social Connection and Isolation Panel [NHANES]    Frequency of Communication with Friends and Family: More than three times a week    Frequency of Social Gatherings with Friends and Family: More than three times a week    Attends Religious Services: Never    Database administrator or Organizations: No    Attends Banker Meetings: Never    Marital Status: Married  Catering manager Violence: Not At Risk (05/26/2023)   Humiliation, Afraid, Rape, and Kick questionnaire    Fear of Current or Ex-Partner: No    Emotionally Abused: No    Physically Abused: No    Sexually Abused: No    Family History  Problem Relation Age of Onset   Hyperlipidemia Mother    Hypertension Mother    COPD Mother    Heart failure Mother    Heart disease Mother    Sleep apnea Mother    Heart disease Father    Hyperlipidemia Father    Hypertension  Father    Heart attack Father    Sudden death Father    Breast cancer Sister    Diabetes Sister 67       negative genetic test (VUS in CHEK2 [id number] possibly mosaic)   Breast cancer Sister 21   Breast cancer Sister        dx 23s   Diabetes Maternal Aunt    Kidney cancer Maternal Uncle  dx >50   Bladder Cancer Other        dx 43s (mother's first cousin)   Thyroid  cancer Other        dx 58s (mother's first cousin)   Breast cancer Other     ROS: no fevers or chills, productive cough, hemoptysis, dysphasia, odynophagia, melena, hematochezia, dysuria, hematuria, rash, seizure activity, orthopnea, PND, pedal edema, claudication. Remaining systems are negative.  Physical Exam: Well-developed well-nourished in no acute distress.  Skin is warm and dry.  HEENT is normal.  Neck is supple.  Chest is clear to auscultation with normal expansion.  Cardiovascular exam is regular rate and rhythm.  Abdominal exam nontender or distended. No masses palpated. Extremities show no edema. neuro grossly intact  EKG Interpretation Date/Time:  Tuesday August 08 2023 08:18:27 EDT Ventricular Rate:  67 PR Interval:  154 QRS Duration:  90 QT Interval:  432 QTC Calculation: 456 R Axis:   -2  Text Interpretation: Normal sinus rhythm Confirmed by Alexandria Angel ([id number]) on 08/08/2023 8:19:53 AM    A/P  1 coronary artery disease/coronary calcification-No CP. No ischemia noted on prior functional study; continue ASA; intolerant to statins.  2 hyperlipidemia-patient is intolerant to statins.  Continue Repatha .  Check lipids.    3 hypertension-BP controlled; continue present meds and follow.  4 dyspnea-felt secondary to COPD.  5 abnormal CT-followed by urology for renal mass.  6 lower extremity edema-continue lasix  at present dose; LV function normal on most recent echo.  Alexandria Angel, MD

## 2023-08-08 ENCOUNTER — Ambulatory Visit: Attending: Cardiology | Admitting: Cardiology

## 2023-08-08 ENCOUNTER — Encounter: Payer: Self-pay | Admitting: Cardiology

## 2023-08-08 VITALS — BP 120/68 | HR 67 | Ht 62.0 in | Wt 222.0 lb

## 2023-08-08 DIAGNOSIS — R600 Localized edema: Secondary | ICD-10-CM

## 2023-08-08 DIAGNOSIS — I2510 Atherosclerotic heart disease of native coronary artery without angina pectoris: Secondary | ICD-10-CM

## 2023-08-08 DIAGNOSIS — R0609 Other forms of dyspnea: Secondary | ICD-10-CM

## 2023-08-08 DIAGNOSIS — E785 Hyperlipidemia, unspecified: Secondary | ICD-10-CM

## 2023-08-08 DIAGNOSIS — I10 Essential (primary) hypertension: Secondary | ICD-10-CM | POA: Diagnosis not present

## 2023-08-08 LAB — LIPID PANEL
Chol/HDL Ratio: 2.8 ratio (ref 0.0–4.4)
Cholesterol, Total: 112 mg/dL (ref 100–199)
HDL: 40 mg/dL (ref >39)
LDL Chol Calc (NIH): 54 mg/dL (ref 0–99)
Triglycerides: 93 mg/dL (ref 0–149)
VLDL Cholesterol Cal: 18 mg/dL (ref 5–40)

## 2023-08-08 NOTE — Patient Instructions (Signed)
   Follow-Up: At [facility], you and your health needs are our priority.  As part of our continuing mission to provide you with exceptional heart care, our providers are all part of one team.  This team includes your primary Cardiologist (physician) and Advanced Practice Providers or APPs (Physician Assistants and Nurse Practitioners) who all work together to provide you with the care you need, when you need it.  Your next appointment:   12 month(s)  Provider:   Olga Millers, MD            1st Floor: - Lobby - Registration  - Pharmacy  - Lab - Cafe  2nd Floor: - PV Lab - Diagnostic Testing (echo, CT, nuclear med)  3rd Floor: - Vacant  4th Floor: - TCTS (cardiothoracic surgery) - AFib Clinic - Structural Heart Clinic - Vascular Surgery  - Vascular Ultrasound  5th Floor: - [facility] Cardiology (general and EP) - Clinical Pharmacy for coumadin, hypertension, lipid, weight-loss medications, and med management appointments    Valet parking services will be available as well.

## 2023-08-09 ENCOUNTER — Encounter: Payer: Self-pay | Admitting: *Deleted

## 2023-08-31 ENCOUNTER — Ambulatory Visit (INDEPENDENT_AMBULATORY_CARE_PROVIDER_SITE_OTHER): Admitting: Family Medicine

## 2023-09-18 ENCOUNTER — Ambulatory Visit: Payer: Medicare HMO | Admitting: Family Medicine

## 2023-09-18 ENCOUNTER — Ambulatory Visit (INDEPENDENT_AMBULATORY_CARE_PROVIDER_SITE_OTHER): Admitting: Family Medicine

## 2024-01-19 ENCOUNTER — Other Ambulatory Visit: Payer: Self-pay | Admitting: Cardiology

## 2024-02-11 ENCOUNTER — Other Ambulatory Visit: Payer: Self-pay | Admitting: Cardiology

## 2024-02-11 DIAGNOSIS — R600 Localized edema: Secondary | ICD-10-CM

## 2024-02-11 DIAGNOSIS — R0609 Other forms of dyspnea: Secondary | ICD-10-CM

## 2024-03-11 ENCOUNTER — Encounter: Payer: Self-pay | Admitting: Cardiology

## 2024-03-11 MED ORDER — REPATHA SURECLICK 140 MG/ML ~~LOC~~ SOAJ: 140.0000 mg | SUBCUTANEOUS | 3 refills | Status: AC

## 2024-03-26 ENCOUNTER — Telehealth: Payer: Self-pay | Admitting: Pharmacy Technician

## 2024-03-26 NOTE — Telephone Encounter (Signed)
   [employer] [facility] -Hypercholesterolemia grant on 03/31/24

## 2024-04-01 NOTE — Telephone Encounter (Signed)
 Amanda Cordova

## 2024-04-15 ENCOUNTER — Encounter: Payer: Self-pay | Admitting: *Deleted

## 2024-05-31 ENCOUNTER — Ambulatory Visit: Payer: Medicare HMO

## 2024-08-12 ENCOUNTER — Ambulatory Visit: Admitting: Cardiology
